# Patient Record
Sex: Male | Born: 1960 | Race: White | Hispanic: No | Marital: Single | Smoking: Never smoker
Health system: Southern US, Community
[De-identification: ages and names within clinical notes are randomized; demographics above are authoritative.]

## PROBLEM LIST (undated history)

## (undated) DIAGNOSIS — Z8669 Personal history of other diseases of the nervous system and sense organs: Secondary | ICD-10-CM

## (undated) DIAGNOSIS — Z9189 Other specified personal risk factors, not elsewhere classified: Secondary | ICD-10-CM

## (undated) DIAGNOSIS — I10 Essential (primary) hypertension: Secondary | ICD-10-CM

## (undated) DIAGNOSIS — N182 Chronic kidney disease, stage 2 (mild): Secondary | ICD-10-CM

## (undated) DIAGNOSIS — G629 Polyneuropathy, unspecified: Secondary | ICD-10-CM

## (undated) DIAGNOSIS — F329 Major depressive disorder, single episode, unspecified: Secondary | ICD-10-CM

## (undated) DIAGNOSIS — R519 Headache, unspecified: Secondary | ICD-10-CM

## (undated) DIAGNOSIS — D649 Anemia, unspecified: Secondary | ICD-10-CM

## (undated) DIAGNOSIS — E1140 Type 2 diabetes mellitus with diabetic neuropathy, unspecified: Secondary | ICD-10-CM

## (undated) DIAGNOSIS — M1990 Unspecified osteoarthritis, unspecified site: Secondary | ICD-10-CM

## (undated) DIAGNOSIS — H548 Legal blindness, as defined in USA: Secondary | ICD-10-CM

## (undated) DIAGNOSIS — Z87442 Personal history of urinary calculi: Secondary | ICD-10-CM

## (undated) DIAGNOSIS — F32A Depression, unspecified: Secondary | ICD-10-CM

## (undated) DIAGNOSIS — E785 Hyperlipidemia, unspecified: Secondary | ICD-10-CM

## (undated) DIAGNOSIS — G2581 Restless legs syndrome: Secondary | ICD-10-CM

## (undated) DIAGNOSIS — E119 Type 2 diabetes mellitus without complications: Secondary | ICD-10-CM

## (undated) DIAGNOSIS — E875 Hyperkalemia: Secondary | ICD-10-CM

## (undated) DIAGNOSIS — B351 Tinea unguium: Secondary | ICD-10-CM

## (undated) DIAGNOSIS — I739 Peripheral vascular disease, unspecified: Secondary | ICD-10-CM

## (undated) DIAGNOSIS — I639 Cerebral infarction, unspecified: Secondary | ICD-10-CM

## (undated) DIAGNOSIS — J80 Acute respiratory distress syndrome: Secondary | ICD-10-CM

## (undated) DIAGNOSIS — K219 Gastro-esophageal reflux disease without esophagitis: Secondary | ICD-10-CM

## (undated) DIAGNOSIS — M75101 Unspecified rotator cuff tear or rupture of right shoulder, not specified as traumatic: Secondary | ICD-10-CM

## (undated) DIAGNOSIS — E559 Vitamin D deficiency, unspecified: Secondary | ICD-10-CM

## (undated) DIAGNOSIS — Z960 Presence of urogenital implants: Secondary | ICD-10-CM

## (undated) DIAGNOSIS — G4730 Sleep apnea, unspecified: Secondary | ICD-10-CM

## (undated) DIAGNOSIS — R51 Headache: Secondary | ICD-10-CM

## (undated) HISTORY — DX: Acute respiratory distress syndrome: J80

## (undated) HISTORY — DX: Type 2 diabetes mellitus with diabetic neuropathy, unspecified: E11.40

## (undated) HISTORY — DX: Tinea unguium: B35.1

## (undated) HISTORY — PX: TONSILLECTOMY AND ADENOIDECTOMY: SUR1326

## (undated) HISTORY — DX: Hyperlipidemia, unspecified: E78.5

## (undated) HISTORY — PX: CARDIOVASCULAR STRESS TEST: SHX262

---

## 2010-08-05 HISTORY — PX: AMPUTATION: SHX166

## 2010-08-05 HISTORY — PX: OTHER SURGICAL HISTORY: SHX169

## 2011-01-04 HISTORY — PX: OTHER SURGICAL HISTORY: SHX169

## 2011-08-06 HISTORY — PX: RETINAL DETACHMENT SURGERY: SHX105

## 2011-08-06 HISTORY — PX: CATARACT EXTRACTION W/ INTRAOCULAR LENS  IMPLANT, BILATERAL: SHX1307

## 2013-02-02 ENCOUNTER — Ambulatory Visit (INDEPENDENT_AMBULATORY_CARE_PROVIDER_SITE_OTHER): Payer: Medicaid Other

## 2013-02-02 ENCOUNTER — Ambulatory Visit (INDEPENDENT_AMBULATORY_CARE_PROVIDER_SITE_OTHER): Payer: Medicaid Other | Admitting: Orthopedic Surgery

## 2013-02-02 VITALS — BP 140/86 | Ht 71.0 in | Wt 298.0 lb

## 2013-02-02 DIAGNOSIS — M25511 Pain in right shoulder: Secondary | ICD-10-CM

## 2013-02-02 DIAGNOSIS — M7540 Impingement syndrome of unspecified shoulder: Secondary | ICD-10-CM | POA: Insufficient documentation

## 2013-02-02 DIAGNOSIS — M25519 Pain in unspecified shoulder: Secondary | ICD-10-CM

## 2013-02-02 DIAGNOSIS — L03113 Cellulitis of right upper limb: Secondary | ICD-10-CM

## 2013-02-02 DIAGNOSIS — IMO0002 Reserved for concepts with insufficient information to code with codable children: Secondary | ICD-10-CM

## 2013-02-02 DIAGNOSIS — M7541 Impingement syndrome of right shoulder: Secondary | ICD-10-CM

## 2013-02-02 MED ORDER — HYDROCODONE-ACETAMINOPHEN 5-325 MG PO TABS: 1.0000 | ORAL_TABLET | Freq: Four times a day (QID) | ORAL | Status: DC | PRN

## 2013-02-02 MED ORDER — DOXYCYCLINE HYCLATE 100 MG PO CAPS: 100.0000 mg | ORAL_CAPSULE | Freq: Two times a day (BID) | ORAL | Status: DC

## 2013-02-02 MED ORDER — DICLOFENAC POTASSIUM 50 MG PO TABS: 50.0000 mg | ORAL_TABLET | Freq: Two times a day (BID) | ORAL | Status: DC

## 2013-02-02 NOTE — Patient Instructions (Addendum)
You have received a steroid shot. 15% of patients experience increased pain at the injection site with in the next 24 hours. This is best treated with ice and tylenol extra strength 2 tabs every 8 hours. If you are still having pain please call the office.   Please call the hospital to START PHYSICAL THERAPY  Impingement Syndrome, Rotator Cuff, Bursitis with Rehab Impingement syndrome is a condition that involves inflammation of the tendons of the rotator cuff and the subacromial bursa, that causes pain in the shoulder. The rotator cuff consists of four tendons and muscles that control much of the shoulder and upper arm function. The subacromial bursa is a fluid filled sac that helps reduce friction between the rotator cuff and one of the bones of the shoulder (acromion). Impingement syndrome is usually an overuse injury that causes swelling of the bursa (bursitis), swelling of the tendon (tendonitis), and/or a tear of the tendon (strain). Strains are classified into three categories. Grade 1 strains cause pain, but the tendon is not lengthened. Grade 2 strains include a lengthened ligament, due to the ligament being stretched or partially ruptured. With grade 2 strains there is still function, although the function may be decreased. Grade 3 strains include a complete tear of the tendon or muscle, and function is usually impaired. SYMPTOMS   Pain around the shoulder, often at the outer portion of the upper arm.  Pain that gets worse with shoulder function, especially when reaching overhead or lifting.  Sometimes, aching when not using the arm.  Pain that wakes you up at night.  Sometimes, tenderness, swelling, warmth, or redness over the affected area.  Loss of strength.  Limited motion of the shoulder, especially reaching behind the back (to the back pocket or to unhook bra) or across your body.  Crackling sound (crepitation) when moving the arm.  Biceps tendon pain and inflammation (in the  front of the shoulder). Worse when bending the elbow or lifting. CAUSES  Impingement syndrome is often an overuse injury, in which chronic (repetitive) motions cause the tendons or bursa to become inflamed. A strain occurs when a force is paced on the tendon or muscle that is greater than it can withstand. Common mechanisms of injury include: Stress from sudden increase in duration, frequency, or intensity of training.  Direct hit (trauma) to the shoulder.  Aging, erosion of the tendon with normal use.  Bony bump on shoulder (acromial spur). RISK INCREASES WITH:  Contact sports (football, wrestling, boxing).  Throwing sports (baseball, tennis, volleyball).  Weightlifting and bodybuilding.  Heavy labor.  Previous injury to the rotator cuff, including impingement.  Poor shoulder strength and flexibility.  Failure to warm up properly before activity.  Inadequate protective equipment.  Old age.  Bony bump on shoulder (acromial spur). PREVENTION   Warm up and stretch properly before activity.  Allow for adequate recovery between workouts.  Maintain physical fitness:  Strength, flexibility, and endurance.  Cardiovascular fitness.  Learn and use proper exercise technique. PROGNOSIS  If treated properly, impingement syndrome usually goes away within 6 weeks. Sometimes surgery is required.  RELATED COMPLICATIONS   Longer healing time if not properly treated, or if not given enough time to heal.  Recurring symptoms, that result in a chronic condition.  Shoulder stiffness, frozen shoulder, or loss of motion.  Rotator cuff tendon tear.  Recurring symptoms, especially if activity is resumed too soon, with overuse, with a direct blow, or when using poor technique. TREATMENT  Treatment first involves the use  of ice and medicine, to reduce pain and inflammation. The use of strengthening and stretching exercises may help reduce pain with activity. These exercises may be  performed at home or with a therapist. If non-surgical treatment is unsuccessful after more than 6 months, surgery may be advised. After surgery and rehabilitation, activity is usually possible in 3 months.  MEDICATION  If pain medicine is needed, nonsteroidal anti-inflammatory medicines (aspirin and ibuprofen), or other minor pain relievers (acetaminophen), are often advised.  Do not take pain medicine for 7 days before surgery.  Prescription pain relievers may be given, if your caregiver thinks they are needed. Use only as directed and only as much as you need.  Corticosteroid injections may be given by your caregiver. These injections should be reserved for the most serious cases, because they may only be given a certain number of times. HEAT AND COLD  Cold treatment (icing) should be applied for 10 to 15 minutes every 2 to 3 hours for inflammation and pain, and immediately after activity that aggravates your symptoms. Use ice packs or an ice massage.  Heat treatment may be used before performing stretching and strengthening activities prescribed by your caregiver, physical therapist, or athletic trainer. Use a heat pack or a warm water soak. SEEK MEDICAL CARE IF:   Symptoms get worse or do not improve in 4 to 6 weeks, despite treatment.  New, unexplained symptoms develop. (Drugs used in treatment may produce side effects.)

## 2013-02-02 NOTE — Progress Notes (Signed)
Patient ID: Oscar Rose, male   DOB: Jul 26, 1961, 52 y.o.   MRN: [id number] Chief Complaint  Patient presents with  . Shoulder Pain    Right shoulder pain, no injury. Referral from Dr. Edrick Oh.        Vital signs are stable as recorded/BP 140/86  Ht 5\' 11"  (1.803 m)  Wt 298 lb (135.172 kg)  BMI 41.58 kg/m2  He has multiple skin lesions over his back and a large one over his upper forearm which appears to be purulent in nature red erythematous, perhaps staph  General appearance is normal, body habitus large  The patient is alert and oriented x 3  The patient's mood and affect are normal  Gait assessment: Normal  The cardiovascular exam reveals normal pulses and temperature without edema or  swelling.  The lymphatic system is negative for palpable lymph nodes  The sensory exam is normal.  There are no pathologic reflexes.  Balance is normal.   Exam of the right shoulder  Inspection tenderness in the peri-acromial region Range of motion painful for elevation 120 with, normal external rotation Stability tests are normal Strength grade 5 motor strength  Skin normal, no rash, or laceration. Provocative tests positive impingement sign and 120  X-rays show no acute abnormalities  Encounter Diagnoses  Name Primary?  . Pain in joint, shoulder region, right   . Impingement syndrome of shoulder, right   . Cellulitis of right upper arm Yes    Shoulder Injection Procedure Note   Pre-operative Diagnosis: right  RC Syndrome  Post-operative Diagnosis: same  Indications: pain   Anesthesia: ethyl chloride   Procedure Details   Verbal consent was obtained for the procedure. The shoulder was prepped withalcohol and the skin was anesthetized. A 20 gauge needle was advanced into the subacromial space through posterior approach without difficulty  The space was then injected with 3 ml 1% lidocaine and 1 ml of depomedrol. The injection site was cleansed with isopropyl  alcohol and a dressing was applied.  Complications:  None; patient tolerated the procedure well.  Recommend physical therapy, [place], Norco, doxycycline for his skin lesion

## 2014-03-22 ENCOUNTER — Encounter: Payer: Self-pay | Admitting: *Deleted

## 2014-03-23 ENCOUNTER — Ambulatory Visit (INDEPENDENT_AMBULATORY_CARE_PROVIDER_SITE_OTHER): Payer: Medicaid Other | Admitting: Cardiology

## 2014-03-23 ENCOUNTER — Encounter: Payer: Self-pay | Admitting: Cardiology

## 2014-03-23 VITALS — BP 132/82 | HR 86 | Ht 70.5 in | Wt 312.0 lb

## 2014-03-23 DIAGNOSIS — R0789 Other chest pain: Secondary | ICD-10-CM

## 2014-03-23 DIAGNOSIS — R072 Precordial pain: Secondary | ICD-10-CM

## 2014-03-23 NOTE — Patient Instructions (Signed)
The current medical regimen is effective;  continue present plan and medications.  Your physician has requested that you have a lexiscan myoview. For further information please visit [email or web address]. Please follow instruction sheet, as given.  Follow up in 6 months with Dr. Percival Spanish in [place].  You will receive a letter in the mail 2 months before you are due.  Please call us when you receive this letter to schedule your follow up appointment.

## 2014-03-23 NOTE — Progress Notes (Signed)
HPI The patient presents as a new patient for evaluation of chest discomfort. He has significant cardiovascular risk factors. He has poorly controlled diabetes.  He doesn't report any prior cardiac history however. He doesn't report any stress testing. For about 6 months he's been getting some chest discomfort. This is a sporadic discomfort. His mid chest. It is mild. He described as a burning and thinks it's heartburn. It comes on spontaneously and goes away spontaneously sometimes lasting 30 minutes he doesn't describe radiation. He does not describe associated symptoms. He does get shortness of breath with activities such as walking up an incline. He does not describe however PND or orthopnea. He's not describing any palpitations, presyncope or syncope.  No Known Allergies  Current Outpatient Prescriptions  Medication Sig Dispense Refill  . amLODipine (NORVASC) 5 MG tablet Take 5 mg by mouth daily.      . fenofibrate micronized (LOFIBRA) 134 MG capsule Take 134 mg by mouth daily before breakfast.      . glipiZIDE (GLUCOTROL XL) 10 MG 24 hr tablet Take 10 mg by mouth daily with breakfast.      . lisinopril (PRINIVIL,ZESTRIL) 10 MG tablet Take 10 mg by mouth daily.      Marland Kitchen omeprazole (PRILOSEC) 40 MG capsule Take 40 mg by mouth daily.      . rosuvastatin (CRESTOR) 20 MG tablet Take 20 mg by mouth daily.       No current facility-administered medications for this visit.    Past Medical History  Diagnosis Date  . HTN (hypertension)   . Diabetes   . Hyperlipidemia   . Cataract   . Diabetic proliferative retinopathy   . CKD (chronic kidney disease)   . Diabetic neuropathy   . Restless leg     Past Surgical History  Procedure Laterality Date  . Amputation      Left big toe partial and right big toe complete  . Cataract extraction    . Tonsillectomy and adenoidectomy      Family History  Problem Relation Age of Onset  . Cancer Mother     Throat    History   Social History    . Marital Status: Single    Spouse Name: N/A    Number of Children: 0  . Years of Education: N/A   Occupational History  . Not on file.   Social History Main Topics  . Smoking status: Never Smoker   . Smokeless tobacco: Not on file  . Alcohol Use: Not on file  . Drug Use: Not on file  . Sexual Activity: Not on file   Other Topics Concern  . Not on file   Social History Narrative   Lives with "wife"    ROS:  Positive for cramping, pain, the, headaches, dizziness, tinnitus, nausea, constipation, reflux. Otherwise as stated in the HPI and negative for all other systems.  PHYSICAL EXAM BP 132/82  Pulse 86  Ht 5' 10.5" (1.791 m)  Wt 312 lb (141.522 kg)  BMI 44.12 kg/m2 GENERAL:  Well appearing HEENT:  Pupils equal round and reactive, fundi not visualized, oral mucosa unremarkable NECK:  No jugular venous distention, waveform within normal limits, carotid upstroke brisk and symmetric, no bruits, no thyromegaly LYMPHATICS:  No cervical, inguinal adenopathy LUNGS:  Clear to auscultation bilaterally BACK:  No CVA tenderness CHEST:  Unremarkable HEART:  PMI not displaced or sustained,S1 and S2 within normal limits, no S3, no S4, no clicks, no rubs, no murmurs ABD:  Flat,  positive bowel sounds normal in frequency in pitch, no bruits, no rebound, no guarding, no midline pulsatile mass, no hepatomegaly, no splenomegaly,obese EXT:  2 plus pulses throughout, mildedema, no cyanosis no clubbing SKIN:  No rashes no nodules NEURO:  Cranial nerves II through XII grossly intact, motor grossly intact throughout PSYCH:  Cognitively intact, oriented to person place and time   EKG:  Sinus rhythm, right axis deviation, rate 81, intervals within normal limits, no acute ST-T wave changes.  03/23/2014   ASSESSMENT AND PLAN  CHEST PAIN:  The pain is somewhat atypical but he has significant cardiovascular risk factors. He needs stress testing would not be a walk on a treadmill. Therefore, he will  have a [employer].  SNORING:  His wife describes apnea but he has refused sleep testing. It was definitely has this diagnosis but I will defer to [place] see if he can talk him into it at some point. He certainly needs weight loss.  HTN:  His blood pressure is well controlled. He'll continue the meds as listed.

## 2014-03-28 ENCOUNTER — Other Ambulatory Visit (HOSPITAL_COMMUNITY): Payer: Self-pay | Admitting: Cardiology

## 2014-03-28 DIAGNOSIS — R0789 Other chest pain: Secondary | ICD-10-CM

## 2014-03-31 ENCOUNTER — Telehealth (HOSPITAL_COMMUNITY): Payer: Self-pay

## 2014-03-31 NOTE — Telephone Encounter (Signed)
Encounter complete. 

## 2014-04-05 ENCOUNTER — Ambulatory Visit (HOSPITAL_COMMUNITY)
Admission: RE | Admit: 2014-04-05 | Discharge: 2014-04-05 | Disposition: A | Discharge status: Home / Self Care | Payer: Medicaid Other | Source: Ambulatory Visit | Attending: Cardiology | Admitting: Cardiology

## 2014-04-05 DIAGNOSIS — R5381 Other malaise: Secondary | ICD-10-CM | POA: Insufficient documentation

## 2014-04-05 DIAGNOSIS — R002 Palpitations: Secondary | ICD-10-CM | POA: Insufficient documentation

## 2014-04-05 DIAGNOSIS — R0609 Other forms of dyspnea: Secondary | ICD-10-CM | POA: Diagnosis not present

## 2014-04-05 DIAGNOSIS — R0789 Other chest pain: Secondary | ICD-10-CM | POA: Insufficient documentation

## 2014-04-05 DIAGNOSIS — R9439 Abnormal result of other cardiovascular function study: Secondary | ICD-10-CM | POA: Insufficient documentation

## 2014-04-05 DIAGNOSIS — R0989 Other specified symptoms and signs involving the circulatory and respiratory systems: Secondary | ICD-10-CM | POA: Diagnosis not present

## 2014-04-05 DIAGNOSIS — R5383 Other fatigue: Secondary | ICD-10-CM | POA: Diagnosis not present

## 2014-04-05 DIAGNOSIS — R42 Dizziness and giddiness: Secondary | ICD-10-CM | POA: Insufficient documentation

## 2014-04-05 MED ORDER — AMINOPHYLLINE 25 MG/ML IV SOLN
150.0000 mg | Freq: Once | INTRAVENOUS | Status: AC
  Administered 2014-04-05: 150 mg via INTRAVENOUS

## 2014-04-05 MED ORDER — TECHNETIUM TC 99M SESTAMIBI GENERIC - CARDIOLITE
30.0000 | Freq: Once | INTRAVENOUS | Status: AC | PRN
  Administered 2014-04-05: 30 via INTRAVENOUS

## 2014-04-05 MED ORDER — REGADENOSON 0.4 MG/5ML IV SOLN
0.4000 mg | Freq: Once | INTRAVENOUS | Status: AC
  Administered 2014-04-05: 0.4 mg via INTRAVENOUS

## 2014-04-05 NOTE — Procedures (Addendum)
[facility] [address] [place] [address] [id number]  Cardiology Nuclear Med Study  Oscar Rose is a 53 y.o. male     MRN : [id number]     DOB: [birth year]  Procedure Date: 04/05/2014  Nuclear Med Background Indication for Stress Test:  Evaluation for Ischemia History:  No prior cardiac or respiratory history reported;No prior NUC MPI for comparison. Cardiac Risk Factors: Family History - CAD, Hypertension, Lipids, NIDDM and Obesity  Symptoms:  Chest Pain, Dizziness, DOE, Fatigue, Light-Headedness and Palpitations   Nuclear Pre-Procedure Caffeine/Decaff Intake:  9:00pm NPO After: 7:00am   IV Site: R Hand  IV 0.9% NS with Angio Cath:  22g  Chest Size (in):  56"  IV Started by: Rolene Course, RN  Height: 5' 10.5" (1.791 m)  Cup Size: n/a  BMI:  Body mass index is 44.12 kg/(m^2). Weight:  312 lb (141.522 kg)   Tech Comments:  n/a    Nuclear Med Study 1 or 2 day study: 2 day  Stress Test Type:  [place] Provider:  Minus Breeding, MD   Resting Radionuclide: Technetium 73m Sestamibi  Resting Radionuclide Dose: 28.0 mCi   Stress Radionuclide:  Technetium 28m Sestamibi  Stress Radionuclide Dose: 30.0 mCi           Stress Protocol Rest HR: 81 Stress HR: 92  Rest BP: 164/68 Stress BP: 146/48  Exercise Time (min): n/a METS: n/a   Predicted Max HR: 169 bpm % Max HR: 54.44 bpm Rate Pressure Product: 13432  Dose of Adenosine (mg):  n/a Dose of Lexiscan: 0.4 mg  Dose of Atropine (mg): n/a Dose of Dobutamine: n/a mcg/kg/min (at max HR)  Stress Test Technologist: Leane Para, CCT Nuclear Technologist: Imagene Riches, CNMT   Rest Procedure:  Myocardial perfusion imaging was performed at rest 45 minutes following the intravenous administration of Technetium 69m Sestamibi. Stress Procedure:  The patient received IV Lexiscan 0.4 mg over 15-seconds.  Technetium 49m Sestamibi injected at 30-seconds.   Patient experienced epigastric pressure and nausea and 150 mg of Aminophylline IV was administered. There were no significant changes with Lexiscan.  Quantitative spect images were obtained after a 45 minute delay.  Transient Ischemic Dilatation (Normal <1.22):  1.14  QGS EDV:  122 ml QGS ESV:  52 ml LV Ejection Fraction: 58%     Rest ECG: NSR with non-specific ST-T wave changes  Stress ECG: No significant change from baseline ECG  QPS Raw Data Images:  Mild diaphragmatic attenuation.  Normal left ventricular size. Stress Images:  Normal homogeneous uptake in all areas of the myocardium. Rest Images:  Normal homogeneous uptake in all areas of the myocardium. Subtraction (SDS):  No evidence of ischemia. LV Wall Motion:  NL LV Function; NL Wall Motion or EF 58%  Impression Exercise Capacity:  Lexiscan with no exercise. BP Response:  Normal blood pressure response. Clinical Symptoms:  No significant symptoms noted. ECG Impression:  No significant ECG changes with Lexiscan. Comparison with Prior Nuclear Study: No previous nuclear study performed   Overall Impression:  Low risk stress nuclear study with mild diaphragmatic attenuation artifact.   Sanda Klein, MD  04/06/2014 12:16 PM

## 2014-04-06 ENCOUNTER — Ambulatory Visit (HOSPITAL_COMMUNITY)
Admission: RE | Admit: 2014-04-06 | Discharge: 2014-04-06 | Disposition: A | Discharge status: Home / Self Care | Payer: Medicaid Other | Source: Ambulatory Visit | Attending: Cardiovascular Disease | Admitting: Cardiovascular Disease

## 2014-04-06 DIAGNOSIS — R0989 Other specified symptoms and signs involving the circulatory and respiratory systems: Secondary | ICD-10-CM | POA: Diagnosis not present

## 2014-04-06 DIAGNOSIS — R42 Dizziness and giddiness: Secondary | ICD-10-CM | POA: Insufficient documentation

## 2014-04-06 DIAGNOSIS — R079 Chest pain, unspecified: Secondary | ICD-10-CM | POA: Diagnosis not present

## 2014-04-06 DIAGNOSIS — R0789 Other chest pain: Secondary | ICD-10-CM

## 2014-04-06 DIAGNOSIS — Z8249 Family history of ischemic heart disease and other diseases of the circulatory system: Secondary | ICD-10-CM | POA: Insufficient documentation

## 2014-04-06 DIAGNOSIS — R5381 Other malaise: Secondary | ICD-10-CM | POA: Insufficient documentation

## 2014-04-06 DIAGNOSIS — R5383 Other fatigue: Secondary | ICD-10-CM

## 2014-04-06 DIAGNOSIS — R9439 Abnormal result of other cardiovascular function study: Secondary | ICD-10-CM | POA: Diagnosis not present

## 2014-04-06 DIAGNOSIS — R002 Palpitations: Secondary | ICD-10-CM | POA: Diagnosis not present

## 2014-04-06 DIAGNOSIS — R0609 Other forms of dyspnea: Secondary | ICD-10-CM | POA: Diagnosis not present

## 2014-04-06 MED ORDER — TECHNETIUM TC 99M SESTAMIBI GENERIC - CARDIOLITE
28.0000 | Freq: Once | INTRAVENOUS | Status: AC | PRN
  Administered 2014-04-06: 28 via INTRAVENOUS

## 2014-06-20 ENCOUNTER — Other Ambulatory Visit: Payer: Self-pay | Admitting: Urology

## 2014-06-27 ENCOUNTER — Encounter (HOSPITAL_COMMUNITY): Payer: Self-pay | Admitting: *Deleted

## 2014-06-28 ENCOUNTER — Encounter (HOSPITAL_BASED_OUTPATIENT_CLINIC_OR_DEPARTMENT_OTHER): Payer: Self-pay | Admitting: *Deleted

## 2014-06-29 ENCOUNTER — Encounter (HOSPITAL_BASED_OUTPATIENT_CLINIC_OR_DEPARTMENT_OTHER): Payer: Self-pay | Admitting: *Deleted

## 2014-06-29 NOTE — Progress Notes (Signed)
   06/29/14 1510  OBSTRUCTIVE SLEEP APNEA  Have you ever been diagnosed with sleep apnea through a sleep study? No  Do you snore loudly (loud enough to be heard through closed doors)?  1  Do you often feel tired, fatigued, or sleepy during the daytime? 0  Has anyone observed you stop breathing during your sleep? 1  Do you have, or are you being treated for high blood pressure? 1  BMI more than 35 kg/m2? 1  Age over 51 years old? 1  Neck circumference greater than 40 cm/16 inches? 1  Gender: 1  Obstructive Sleep Apnea Score 7  Score 4 or greater  Results sent to PCP

## 2014-06-29 NOTE — Progress Notes (Signed)
NPO AFTER MN. ARRIVE AT 1100. NEEDS ISTAT. CURRENT EKG IN CHART AND EPIC. WILL TAKE TRICOR AND PRILOSEC AM DOS W/ SIPS OF WATER.

## 2014-07-04 ENCOUNTER — Ambulatory Visit (HOSPITAL_COMMUNITY): Payer: Medicaid Other

## 2014-07-04 ENCOUNTER — Encounter (HOSPITAL_COMMUNITY)
Admission: RE | Disposition: A | Discharge status: Home / Self Care | Payer: Self-pay | Source: Ambulatory Visit | Attending: Urology

## 2014-07-04 ENCOUNTER — Ambulatory Visit (HOSPITAL_COMMUNITY)
Admission: RE | Admit: 2014-07-04 | Discharge: 2014-07-04 | Disposition: A | Discharge status: Home / Self Care | Payer: Medicaid Other | Source: Ambulatory Visit | Attending: Urology | Admitting: Urology

## 2014-07-04 ENCOUNTER — Encounter (HOSPITAL_COMMUNITY): Payer: Self-pay | Admitting: *Deleted

## 2014-07-04 DIAGNOSIS — Z833 Family history of diabetes mellitus: Secondary | ICD-10-CM | POA: Insufficient documentation

## 2014-07-04 DIAGNOSIS — N183 Chronic kidney disease, stage 3 (moderate): Secondary | ICD-10-CM | POA: Insufficient documentation

## 2014-07-04 DIAGNOSIS — I129 Hypertensive chronic kidney disease with stage 1 through stage 4 chronic kidney disease, or unspecified chronic kidney disease: Secondary | ICD-10-CM | POA: Insufficient documentation

## 2014-07-04 DIAGNOSIS — Z960 Presence of urogenital implants: Secondary | ICD-10-CM | POA: Insufficient documentation

## 2014-07-04 DIAGNOSIS — Z794 Long term (current) use of insulin: Secondary | ICD-10-CM | POA: Insufficient documentation

## 2014-07-04 DIAGNOSIS — E119 Type 2 diabetes mellitus without complications: Secondary | ICD-10-CM | POA: Insufficient documentation

## 2014-07-04 DIAGNOSIS — G4733 Obstructive sleep apnea (adult) (pediatric): Secondary | ICD-10-CM | POA: Diagnosis not present

## 2014-07-04 DIAGNOSIS — N210 Calculus in bladder: Secondary | ICD-10-CM | POA: Insufficient documentation

## 2014-07-04 DIAGNOSIS — Z6841 Body Mass Index (BMI) 40.0 and over, adult: Secondary | ICD-10-CM | POA: Insufficient documentation

## 2014-07-04 DIAGNOSIS — Z8639 Personal history of other endocrine, nutritional and metabolic disease: Secondary | ICD-10-CM | POA: Insufficient documentation

## 2014-07-04 DIAGNOSIS — Z8719 Personal history of other diseases of the digestive system: Secondary | ICD-10-CM | POA: Diagnosis not present

## 2014-07-04 DIAGNOSIS — Z8679 Personal history of other diseases of the circulatory system: Secondary | ICD-10-CM | POA: Diagnosis not present

## 2014-07-04 DIAGNOSIS — Z87448 Personal history of other diseases of urinary system: Secondary | ICD-10-CM | POA: Diagnosis not present

## 2014-07-04 DIAGNOSIS — Z801 Family history of malignant neoplasm of trachea, bronchus and lung: Secondary | ICD-10-CM | POA: Diagnosis not present

## 2014-07-04 DIAGNOSIS — N200 Calculus of kidney: Secondary | ICD-10-CM | POA: Diagnosis not present

## 2014-07-04 DIAGNOSIS — N201 Calculus of ureter: Secondary | ICD-10-CM

## 2014-07-04 DIAGNOSIS — Z8669 Personal history of other diseases of the nervous system and sense organs: Secondary | ICD-10-CM | POA: Diagnosis not present

## 2014-07-04 HISTORY — DX: Depression, unspecified: F32.A

## 2014-07-04 HISTORY — DX: Major depressive disorder, single episode, unspecified: F32.9

## 2014-07-04 HISTORY — DX: Unspecified osteoarthritis, unspecified site: M19.90

## 2014-07-04 HISTORY — DX: Gastro-esophageal reflux disease without esophagitis: K21.9

## 2014-07-04 LAB — GLUCOSE, CAPILLARY: GLUCOSE-CAPILLARY: 174 mg/dL — AB (ref 70–99)

## 2014-07-04 SURGERY — LITHOTRIPSY, ESWL
Anesthesia: LOCAL | Laterality: Left

## 2014-07-04 MED ORDER — DEXTROSE-NACL 5-0.45 % IV SOLN
INTRAVENOUS | Status: DC
  Administered 2014-07-04: 15:00:00 via INTRAVENOUS

## 2014-07-04 MED ORDER — OXYCODONE-ACETAMINOPHEN 5-325 MG PO TABS: 1.0000 | ORAL_TABLET | Freq: Four times a day (QID) | ORAL | Status: DC | PRN

## 2014-07-04 MED ORDER — CIPROFLOXACIN HCL 500 MG PO TABS
500.0000 mg | ORAL_TABLET | ORAL | Status: AC
  Administered 2014-07-04: 500 mg via ORAL
  Filled 2014-07-04: qty 1

## 2014-07-04 MED ORDER — DIPHENHYDRAMINE HCL 25 MG PO CAPS
25.0000 mg | ORAL_CAPSULE | ORAL | Status: AC
  Administered 2014-07-04: 25 mg via ORAL
  Filled 2014-07-04: qty 1

## 2014-07-04 MED ORDER — DIAZEPAM 5 MG PO TABS
10.0000 mg | ORAL_TABLET | ORAL | Status: AC
  Administered 2014-07-04: 10 mg via ORAL
  Filled 2014-07-04: qty 2

## 2014-07-04 NOTE — H&P (Signed)
Active Problems Problems  1. Nephrolithiasis (N20.0) 2. Ureteral stent retained (Z96.0)  History of Present Illness Oscar Rose is a 53 yo WM sent in consultation from [facility] for stone disease. He had a renal US for renal insufficiency and he was found to have a 2.8cm bladder stone and mild left hydro with renal stones.  He has a history of stones that was treated endoscopically at [facility] on 01/17/11 by Dr. Glendell Docker.  He has some left flank pain but he has chronic nausea so it is difficult for his to know if it is from the pain. He has had no hematuria.  He has no voiding complaints other than frequency from a diuretic.   Past Medical History Problems  1. History of diabetes mellitus (Z86.39) 2. History of esophageal reflux (Z87.19) 3. History of glaucoma (Z86.69) 4. History of hypercholesterolemia (Z86.39) 5. History of hypertension (Z86.79) 6. History of renal insufficiency syndrome (Z87.448) 7. History of Obstructive sleep apnea, adult (G47.33)  Surgical History Problems  1. History of Back Surgery 2. History of Cystoscopy With Ureteroscopy Left 3. History of Foot Surgery 4. History of Kidney Surgery  Current Meds 1. Crestor 20 MG Oral Tablet;  Therapy: (Recorded:16Nov2015) to Recorded 2. Fenofibrate TABS;  Therapy: (Recorded:16Nov2015) to Recorded 3. Furosemide 20 MG Oral Tablet;  Therapy: (Recorded:16Nov2015) to Recorded 4. GlipiZIDE 10 MG Oral Tablet;  Therapy: (Recorded:16Nov2015) to Recorded 5. Lantus SOLN;  Therapy: (Recorded:16Nov2015) to Recorded 6. Lisinopril 20 MG Oral Tablet;  Therapy: (Recorded:16Nov2015) to Recorded 7. Omeprazole 40 MG Oral Capsule Delayed Release;  Therapy: (Recorded:16Nov2015) to Recorded  Allergies Medication  1. No Known Drug Allergies  Family History Problems  1. Family history of diabetes mellitus (Z83.3) : Maternal Aunt 2. Family history of Throat cancer : Mother  Social History Problems    Denied: History of Alcohol  use   Caffeine use (F15.90)   very little   Never a smoker   Number of children   none   Occupation   none   Single  Review of Systems Genitourinary, constitutional, skin, eye, otolaryngeal, hematologic/lymphatic, cardiovascular, pulmonary, endocrine, musculoskeletal, gastrointestinal, neurological and psychiatric system(s) were reviewed and pertinent findings if present are noted and are otherwise negative.  Genitourinary: nocturia and erectile dysfunction.  Gastrointestinal: nausea and heartburn.  Cardiovascular: leg swelling.  Neurological: dizziness.    Vitals Vital Signs [Data Includes: Last 1 Day]  Recorded: [id number] 09:36AM  Height: 5 ft 10 in Weight: 319 lb  BMI Calculated: 45.77 BSA Calculated: 2.55 Blood Pressure: 117 / 74 Temperature: 98.4 F Heart Rate: 102  Physical Exam Constitutional: Well nourished and well developed . No acute distress.  ENT:. The ears and nose are normal in appearance.  Neck: The appearance of the neck is normal and no neck mass is present.  Pulmonary: No respiratory distress and normal respiratory rhythm and effort.  Cardiovascular: Heart rate and rhythm are normal . No peripheral edema.  Abdomen: The abdomen is obese. The abdomen is soft and nontender. No masses are palpated. No CVA tenderness. No hernias are palpable. No hepatosplenomegaly noted.  Lymphatics: The posterior cervical and supraclavicular nodes are not enlarged or tender.  Skin: Normal skin turgor, no visible rash and no visible skin lesions.  Neuro/Psych:. Mood and affect are appropriate.    Results/Data  Urine [Data Includes: Last 1 Day]   [id number]  COLOR YELLOW   APPEARANCE CLOUDY   SPECIFIC GRAVITY 1.025   pH 5.5   GLUCOSE 100 mg/dL  BILIRUBIN NEG   KETONE  NEG mg/dL  BLOOD LARGE   PROTEIN TRACE mg/dL  UROBILINOGEN 0.2 mg/dL  NITRITE NEG   LEUKOCYTE ESTERASE TRACE   SQUAMOUS EPITHELIAL/HPF RARE   WBC 3-6 WBC/hpf  RBC 21-50 RBC/hpf  BACTERIA FEW    CRYSTALS NONE SEEN   CASTS NONE SEEN    Old records or history reviewed: I have reviewed records from [facility].  The following images/tracing/specimen were independently visualized:  renal US films reviewed and he has a 1cm LLP stone with mild hydro and a 2.8cm probable bladder stone. KUB today shows a retained left ureteral stent with marked encrustation of the stent in the area of the UPJ and proximal coil about 1+cm in diameter. There is encrustation of the distal coil but the stent is intact. There are no gas, soft tissue or significant bony abnormalities. CT today confirms the encrusted stent but the ureteral portion is clean. He has an addition 1cm stone in a posterior calyx in addition to the encrusted intrarenal and bladder portions of the stent. A full report is pending.  The following clinical lab reports were reviewed:  UA reviewed.  The following radiology reports were reviewed: I have reviewed his outside renal US report.  Will request old records/history: I have obtained the imaging records from [facility].  20 Jun 2014 9:07 AM  UA With REFLEX    COLOR YELLOW     APPEARANCE CLOUDY     SPECIFIC GRAVITY 1.025     pH 5.5     GLUCOSE 100     BILIRUBIN NEG     KETONE NEG     BLOOD LARGE     PROTEIN TRACE     UROBILINOGEN 0.2     NITRITE NEG     LEUKOCYTE ESTERASE TRACE     SQUAMOUS EPITHELIAL/HPF RARE     WBC 3-6     CRYSTALS NONE SEEN     CASTS NONE SEEN     RBC 21-50     BACTERIA FEW     Assessment Assessed  1. History of renal insufficiency syndrome (Z87.448) 2. Nephrolithiasis (N20.0) 3. Ureteral stent retained (Z96.0)  He has a retained stent with proximal and distal encrustation.   Plan  Nephrolithiasis  1. URINE CULTURE; Status:Hold For - Specimen/Data Collection,Appointment; Requested  for:16Nov2015;  2. Get Outside Records Office  Follow-up  Status: Hold For - Records,Records Release   Requested for: [id number] Nephrolithiasis, Ureteral stent retained   3. AU CT-STONE PROTOCOL; Status:In Progress - Specimen/Data Collected;   Done:  [id number] 12:00AM Ureteral stent retained  4. Follow-up Schedule Surgery Office  Follow-up  Status: Hold For - Appointment   Requested for: [phone]  I am going to set him up for ESWL of the proximal encrustation and then will take him to the OR for cystoscopy with stent removal and ureteroscopy with stone removal as needed.  I have reviewed the risks of bleeding, infection, ureteral and renal injury, need for additional procedures including a perc, thrombotic events and anesthetic complications.  I will request records from [facility] and Dr. Carl/Dr. Exie Parody regarding his prior procedure.  KUB; Status:Resulted - Requires Verification;  Done: [id number] 12:00AM Due:18Nov2015; Marked Important;Ordered; Today;  [id number]; Ordered [id number], Kymari Nuon;   Discussion/Summary  CC: Dr. Corliss Parish and Dr. Dione Housekeeper. 1

## 2014-07-04 NOTE — Interval H&P Note (Signed)
History and Physical Interval Note:  No changes.   He will have ureteroscopy on Thursday.  07/04/2014 4:22 PM  Alexander Mt  has presented today for surgery, with the diagnosis of RETAINED LEFT STENT WITH ENCRUSTING STONES  The various methods of treatment have been discussed with the patient and family. After consideration of risks, benefits and other options for treatment, the patient has consented to  Procedure(s): LEFT EXTRACORPOREAL SHOCK WAVE LITHOTRIPSY (ESWL) (Left) as a surgical intervention .  The patient's history has been reviewed, patient examined, no change in status, stable for surgery.  I have reviewed the patient's chart and labs.  Questions were answered to the patient's satisfaction.     Danyl Deems J

## 2014-07-04 NOTE — Discharge Instructions (Signed)
Lithotripsy for Kidney Stones °Lithotripsy is a treatment that can sometimes help eliminate kidney stones and pain that they cause. A form of lithotripsy, also known as extracorporeal shock wave lithotripsy, is a nonsurgical procedure that helps your body rid itself of the kidney stone when it is too big to pass on its own. Extracorporeal shock wave lithotripsy is a method of crushing a kidney stone with shock waves. These shock waves pass through your body and are focused on your stone. They cause the kidney stones to crumble while still in the urinary tract. It is then easier for the smaller pieces of stone to pass in the urine. °Lithotripsy usually takes about an hour. It is done in a hospital, a lithotripsy center, or a mobile unit. It usually does not require an overnight stay. Your health care provider will instruct you on preparation for the procedure. Your health care provider will tell you what to expect afterward. °LET YOUR HEALTH CARE PROVIDER KNOW ABOUT: °· Any allergies you have. °· All medicines you are taking, including vitamins, herbs, eye drops, creams, and over-the-counter medicines. °· Previous problems you or members of your family have had with the use of anesthetics. °· Any blood disorders you have. °· Previous surgeries you have had. °· Medical conditions you have. °RISKS AND COMPLICATIONS °Generally, lithotripsy for kidney stones is a safe procedure. However, as with any procedure, complications can occur. Possible complications include: °· Infection. °· Bleeding of the kidney. °· Bruising of the kidney or skin. °· Obstruction of the ureter. °· Failure of the stone to fragment. °BEFORE THE PROCEDURE °· Do not eat or drink for 6-8 hours prior to the procedure. You may, however, take the medications with a sip of water that your physician instructs you to take °· Do not take aspirin or aspirin-containing products for 7 days prior to your procedure °· Do not take nonsteroidal anti-inflammatory  products for 7 days prior to your procedure °PROCEDURE °A stent (flexible tube with holes) may be placed in your ureter. The ureter is the tube that transports the urine from the kidneys to the bladder. Your health care provider may place a stent before the procedure. This will help keep urine flowing from the kidney if the fragments of the stone block the ureter. You may have an IV tube placed in one of your veins to give you fluids and medicines. These medicines may help you relax or make you sleep. During the procedure, you will lie comfortably on a fluid-filled cushion or in a warm-water bath. After an X-ray or ultrasound exam to locate your stone, shock waves are aimed at the stone. If you are awake, you may feel a tapping sensation as the shock waves pass through your body. If large stone particles remain after treatment, a second procedure may be necessary at a later date. °For comfort during the test: °· Relax as much as possible. °· Try to remain still as much as possible. °· Try to follow instructions to speed up the test. °· Let your health care provider know if you are uncomfortable, anxious, or in pain. °AFTER THE PROCEDURE  °After surgery, you will be taken to the recovery area. A nurse will watch and check your progress. Once you're awake, stable, and taking fluids well, you will be allowed to go home as long as there are no problems. You will also be allowed to pass your urine before discharge. You may be given antibiotics to help prevent infection. You may also be prescribed   pain medicine if needed. In a week or two, your health care provider may remove your stent, if you have one. You may first have an X-ray exam to check on how successful the fragmentation of your stone has been and how much of the stone has passed. Your health care provider will check to see whether or not stone particles remain. °SEEK IMMEDIATE MEDICAL CARE IF: °· You develop a fever or shaking chills. °· Your pain is not  relieved by medicine. °· You feel sick to your stomach (nauseated) and you vomit. °· You develop heavy bleeding. °· You have difficulty urinating. °· You start to pass your stent from your penis. °Document Released: 07/19/2000 Document Revised: 05/12/2013 Document Reviewed: 02/04/2013 °[employer]® Patient Information ©2015 [employer]. This information is not intended to replace advice given to you by your health care provider. Make sure you discuss any questions you have with your health care provider. ° °

## 2014-07-07 ENCOUNTER — Encounter (HOSPITAL_BASED_OUTPATIENT_CLINIC_OR_DEPARTMENT_OTHER)
Admission: RE | Disposition: A | Discharge status: Home / Self Care | Payer: Self-pay | Source: Ambulatory Visit | Attending: Urology

## 2014-07-07 ENCOUNTER — Ambulatory Visit (HOSPITAL_COMMUNITY)
Admission: RE | Admit: 2014-07-07 | Discharge: 2014-07-07 | Disposition: A | Discharge status: Home / Self Care | Payer: Medicaid Other | Source: Ambulatory Visit | Attending: Urology | Admitting: Urology

## 2014-07-07 ENCOUNTER — Encounter (HOSPITAL_BASED_OUTPATIENT_CLINIC_OR_DEPARTMENT_OTHER): Payer: Self-pay | Admitting: *Deleted

## 2014-07-07 ENCOUNTER — Ambulatory Visit (HOSPITAL_BASED_OUTPATIENT_CLINIC_OR_DEPARTMENT_OTHER)
Admission: RE | Admit: 2014-07-07 | Discharge: 2014-07-07 | Disposition: A | Discharge status: Home / Self Care | Payer: Medicaid Other | Source: Ambulatory Visit | Attending: Urology | Admitting: Urology

## 2014-07-07 ENCOUNTER — Ambulatory Visit (HOSPITAL_BASED_OUTPATIENT_CLINIC_OR_DEPARTMENT_OTHER): Payer: Medicaid Other | Admitting: Anesthesiology

## 2014-07-07 DIAGNOSIS — E780 Pure hypercholesterolemia: Secondary | ICD-10-CM | POA: Insufficient documentation

## 2014-07-07 DIAGNOSIS — Z79899 Other long term (current) drug therapy: Secondary | ICD-10-CM | POA: Diagnosis not present

## 2014-07-07 DIAGNOSIS — G4733 Obstructive sleep apnea (adult) (pediatric): Secondary | ICD-10-CM | POA: Insufficient documentation

## 2014-07-07 DIAGNOSIS — H409 Unspecified glaucoma: Secondary | ICD-10-CM | POA: Insufficient documentation

## 2014-07-07 DIAGNOSIS — K219 Gastro-esophageal reflux disease without esophagitis: Secondary | ICD-10-CM | POA: Insufficient documentation

## 2014-07-07 DIAGNOSIS — Y831 Surgical operation with implant of artificial internal device as the cause of abnormal reaction of the patient, or of later complication, without mention of misadventure at the time of the procedure: Secondary | ICD-10-CM | POA: Insufficient documentation

## 2014-07-07 DIAGNOSIS — I10 Essential (primary) hypertension: Secondary | ICD-10-CM | POA: Insufficient documentation

## 2014-07-07 DIAGNOSIS — Z6835 Body mass index (BMI) 35.0-35.9, adult: Secondary | ICD-10-CM | POA: Diagnosis not present

## 2014-07-07 DIAGNOSIS — N200 Calculus of kidney: Secondary | ICD-10-CM | POA: Diagnosis not present

## 2014-07-07 DIAGNOSIS — E119 Type 2 diabetes mellitus without complications: Secondary | ICD-10-CM | POA: Diagnosis not present

## 2014-07-07 DIAGNOSIS — T8389XA Other specified complication of genitourinary prosthetic devices, implants and grafts, initial encounter: Secondary | ICD-10-CM | POA: Insufficient documentation

## 2014-07-07 DIAGNOSIS — Z960 Presence of urogenital implants: Secondary | ICD-10-CM | POA: Insufficient documentation

## 2014-07-07 DIAGNOSIS — Z794 Long term (current) use of insulin: Secondary | ICD-10-CM | POA: Insufficient documentation

## 2014-07-07 HISTORY — PX: HOLMIUM LASER APPLICATION: SHX5852

## 2014-07-07 HISTORY — DX: Unspecified rotator cuff tear or rupture of right shoulder, not specified as traumatic: M75.101

## 2014-07-07 HISTORY — DX: Type 2 diabetes mellitus without complications: E11.9

## 2014-07-07 HISTORY — DX: Chronic kidney disease, stage 2 (mild): N18.2

## 2014-07-07 HISTORY — DX: Personal history of other diseases of the nervous system and sense organs: Z86.69

## 2014-07-07 HISTORY — DX: Legal blindness, as defined in USA: H54.8

## 2014-07-07 HISTORY — DX: Essential (primary) hypertension: I10

## 2014-07-07 HISTORY — DX: Restless legs syndrome: G25.81

## 2014-07-07 HISTORY — DX: Personal history of urinary calculi: Z87.442

## 2014-07-07 HISTORY — DX: Other specified personal risk factors, not elsewhere classified: Z91.89

## 2014-07-07 HISTORY — DX: Presence of urogenital implants: Z96.0

## 2014-07-07 HISTORY — PX: CYSTOSCOPY W/ URETERAL STENT REMOVAL: SHX1430

## 2014-07-07 HISTORY — PX: CYSTOSCOPY WITH URETEROSCOPY AND STENT PLACEMENT: SHX6377

## 2014-07-07 LAB — POCT I-STAT 4, (NA,K, GLUC, HGB,HCT)
Glucose, Bld: 172 mg/dL — ABNORMAL HIGH (ref 70–99)
HCT: 37 % — ABNORMAL LOW (ref 39.0–52.0)
HEMOGLOBIN: 12.6 g/dL — AB (ref 13.0–17.0)
POTASSIUM: 4.5 meq/L (ref 3.7–5.3)
Sodium: 137 mEq/L (ref 137–147)

## 2014-07-07 LAB — GLUCOSE, CAPILLARY: GLUCOSE-CAPILLARY: 190 mg/dL — AB (ref 70–99)

## 2014-07-07 SURGERY — REMOVAL, STENT, URETER, CYSTOSCOPIC
Anesthesia: General | Site: Ureter

## 2014-07-07 MED ORDER — EPHEDRINE SULFATE 50 MG/ML IJ SOLN
INTRAMUSCULAR | Status: DC | PRN
  Administered 2014-07-07 (×3): 10 mg via INTRAVENOUS

## 2014-07-07 MED ORDER — SODIUM CHLORIDE 0.9 % IJ SOLN
3.0000 mL | Freq: Two times a day (BID) | INTRAMUSCULAR | Status: DC
  Filled 2014-07-07: qty 3

## 2014-07-07 MED ORDER — METOCLOPRAMIDE HCL 5 MG/ML IJ SOLN
INTRAMUSCULAR | Status: DC | PRN
  Administered 2014-07-07: 10 mg via INTRAVENOUS

## 2014-07-07 MED ORDER — PHENYLEPHRINE HCL 10 MG/ML IJ SOLN
INTRAMUSCULAR | Status: DC | PRN
  Administered 2014-07-07 (×5): 40 ug via INTRAVENOUS

## 2014-07-07 MED ORDER — PROPOFOL 10 MG/ML IV BOLUS
INTRAVENOUS | Status: DC | PRN
  Administered 2014-07-07: 300 mg via INTRAVENOUS
  Administered 2014-07-07: 20 mg via INTRAVENOUS
  Administered 2014-07-07 (×2): 30 mg via INTRAVENOUS

## 2014-07-07 MED ORDER — MIDAZOLAM HCL 5 MG/5ML IJ SOLN
INTRAMUSCULAR | Status: DC | PRN
  Administered 2014-07-07: 2 mg via INTRAVENOUS

## 2014-07-07 MED ORDER — MIDAZOLAM HCL 2 MG/2ML IJ SOLN
INTRAMUSCULAR | Status: AC
  Filled 2014-07-07: qty 2

## 2014-07-07 MED ORDER — OXYCODONE-ACETAMINOPHEN 5-325 MG PO TABS: 1.0000 | ORAL_TABLET | Freq: Four times a day (QID) | ORAL | Status: DC | PRN

## 2014-07-07 MED ORDER — LACTATED RINGERS IV SOLN
INTRAVENOUS | Status: DC
  Administered 2014-07-07 (×2): via INTRAVENOUS
  Filled 2014-07-07: qty 1000

## 2014-07-07 MED ORDER — CIPROFLOXACIN HCL 500 MG PO TABS: 500.0000 mg | ORAL_TABLET | Freq: Two times a day (BID) | ORAL | Status: DC

## 2014-07-07 MED ORDER — ONDANSETRON HCL 4 MG/2ML IJ SOLN
INTRAMUSCULAR | Status: DC | PRN
  Administered 2014-07-07 (×2): 4 mg via INTRAVENOUS

## 2014-07-07 MED ORDER — LIDOCAINE HCL (CARDIAC) 20 MG/ML IV SOLN
INTRAVENOUS | Status: DC | PRN
  Administered 2014-07-07: 100 mg via INTRAVENOUS

## 2014-07-07 MED ORDER — FENTANYL CITRATE 0.05 MG/ML IJ SOLN
INTRAMUSCULAR | Status: DC | PRN
  Administered 2014-07-07: 25 ug via INTRAVENOUS
  Administered 2014-07-07: 50 ug via INTRAVENOUS
  Administered 2014-07-07 (×3): 25 ug via INTRAVENOUS

## 2014-07-07 MED ORDER — PHENAZOPYRIDINE HCL 200 MG PO TABS: 200.0000 mg | ORAL_TABLET | Freq: Three times a day (TID) | ORAL | Status: DC | PRN

## 2014-07-07 MED ORDER — IOHEXOL 350 MG/ML SOLN
INTRAVENOUS | Status: DC | PRN
  Administered 2014-07-07: 10 mL

## 2014-07-07 MED ORDER — BELLADONNA ALKALOIDS-OPIUM 16.2-60 MG RE SUPP
RECTAL | Status: AC
  Filled 2014-07-07: qty 1

## 2014-07-07 MED ORDER — ACETAMINOPHEN 650 MG RE SUPP
650.0000 mg | RECTAL | Status: DC | PRN
  Filled 2014-07-07: qty 1

## 2014-07-07 MED ORDER — CIPROFLOXACIN IN D5W 400 MG/200ML IV SOLN
INTRAVENOUS | Status: AC
  Filled 2014-07-07: qty 200

## 2014-07-07 MED ORDER — PROMETHAZINE HCL 25 MG/ML IJ SOLN
6.2500 mg | INTRAMUSCULAR | Status: DC | PRN
  Filled 2014-07-07: qty 1

## 2014-07-07 MED ORDER — ACETAMINOPHEN 325 MG PO TABS
650.0000 mg | ORAL_TABLET | ORAL | Status: DC | PRN
  Filled 2014-07-07: qty 2

## 2014-07-07 MED ORDER — DEXAMETHASONE SODIUM PHOSPHATE 4 MG/ML IJ SOLN
INTRAMUSCULAR | Status: DC | PRN
  Administered 2014-07-07: 10 mg via INTRAVENOUS

## 2014-07-07 MED ORDER — SODIUM CHLORIDE 0.9 % IJ SOLN
3.0000 mL | INTRAMUSCULAR | Status: DC | PRN
  Filled 2014-07-07: qty 3

## 2014-07-07 MED ORDER — FENTANYL CITRATE 0.05 MG/ML IJ SOLN
25.0000 ug | INTRAMUSCULAR | Status: DC | PRN
  Filled 2014-07-07: qty 1

## 2014-07-07 MED ORDER — FENTANYL CITRATE 0.05 MG/ML IJ SOLN
INTRAMUSCULAR | Status: AC
  Filled 2014-07-07: qty 4

## 2014-07-07 MED ORDER — SODIUM CHLORIDE 0.9 % IR SOLN
Status: DC | PRN
  Administered 2014-07-07: 9000 mL via INTRAVESICAL

## 2014-07-07 MED ORDER — OXYCODONE HCL 5 MG PO TABS
5.0000 mg | ORAL_TABLET | ORAL | Status: DC | PRN
  Filled 2014-07-07: qty 2

## 2014-07-07 MED ORDER — ACETAMINOPHEN 10 MG/ML IV SOLN
INTRAVENOUS | Status: DC | PRN
  Administered 2014-07-07: 1000 mg via INTRAVENOUS

## 2014-07-07 MED ORDER — CIPROFLOXACIN IN D5W 400 MG/200ML IV SOLN
400.0000 mg | INTRAVENOUS | Status: AC
  Administered 2014-07-07: 400 mg via INTRAVENOUS
  Filled 2014-07-07: qty 200

## 2014-07-07 MED ORDER — SODIUM CHLORIDE 0.9 % IV SOLN
250.0000 mL | INTRAVENOUS | Status: DC | PRN
  Filled 2014-07-07: qty 250

## 2014-07-07 SURGICAL SUPPLY — 45 items
BAG DRAIN URO-CYSTO SKYTR STRL (DRAIN) ×4 IMPLANT
BASKET LASER NITINOL 1.9FR (BASKET) IMPLANT
BASKET SEGURA 3FR (UROLOGICAL SUPPLIES) IMPLANT
BASKET STNLS GEMINI 4WIRE 3FR (BASKET) IMPLANT
BASKET ZERO TIP NITINOL 2.4FR (BASKET) IMPLANT
CANISTER SUCT LVC 12 LTR MEDI- (MISCELLANEOUS) ×4 IMPLANT
CATH URET 5FR 28IN CONE TIP (BALLOONS)
CATH URET 5FR 28IN OPEN ENDED (CATHETERS) IMPLANT
CATH URET 5FR 70CM CONE TIP (BALLOONS) IMPLANT
CLOTH BEACON ORANGE TIMEOUT ST (SAFETY) ×4 IMPLANT
DRAPE CAMERA CLOSED 9X96 (DRAPES) ×4 IMPLANT
ELECT REM PT RETURN 9FT ADLT (ELECTROSURGICAL)
ELECTRODE REM PT RTRN 9FT ADLT (ELECTROSURGICAL) IMPLANT
FIBER LASER FLEXIVA 1000 (UROLOGICAL SUPPLIES) ×4 IMPLANT
FIBER LASER FLEXIVA 200 (UROLOGICAL SUPPLIES) IMPLANT
FIBER LASER FLEXIVA 365 (UROLOGICAL SUPPLIES) IMPLANT
FIBER LASER FLEXIVA 550 (UROLOGICAL SUPPLIES) IMPLANT
FIBER LASER TRAC TIP (UROLOGICAL SUPPLIES) ×4 IMPLANT
FORCEP 3 PRONG GRASPING (INSTRUMENTS) ×4 IMPLANT
FORCEPS GRASP N/RETR BSKT (INSTRUMENTS) ×4 IMPLANT
GLOVE BIO SURGEON STRL SZ 6.5 (GLOVE) ×3 IMPLANT
GLOVE BIO SURGEONS STRL SZ 6.5 (GLOVE) ×1
GLOVE INDICATOR 6.5 STRL GRN (GLOVE) ×8 IMPLANT
GLOVE SURG SS PI 8.0 STRL IVOR (GLOVE) ×4 IMPLANT
GOWN PREVENTION PLUS LG XLONG (DISPOSABLE) IMPLANT
GOWN STRL NON-REIN LRG LVL3 (GOWN DISPOSABLE) ×4 IMPLANT
GOWN STRL REIN XL XLG (GOWN DISPOSABLE) IMPLANT
GOWN STRL REUS W/ TWL LRG LVL3 (GOWN DISPOSABLE) ×2 IMPLANT
GOWN STRL REUS W/ TWL XL LVL3 (GOWN DISPOSABLE) ×2 IMPLANT
GOWN STRL REUS W/TWL LRG LVL3 (GOWN DISPOSABLE) ×2
GOWN STRL REUS W/TWL XL LVL3 (GOWN DISPOSABLE) ×2
GUIDEWIRE 0.038 PTFE COATED (WIRE) IMPLANT
GUIDEWIRE ANG ZIPWIRE 038X150 (WIRE) IMPLANT
GUIDEWIRE STR DUAL SENSOR (WIRE) ×8 IMPLANT
IV NS IRRIG 3000ML ARTHROMATIC (IV SOLUTION) ×16 IMPLANT
KIT BALLIN UROMAX 15FX10 (LABEL) IMPLANT
KIT BALLN UROMAX 15FX4 (MISCELLANEOUS) IMPLANT
KIT BALLN UROMAX 26 75X4 (MISCELLANEOUS)
PACK CYSTO (CUSTOM PROCEDURE TRAY) ×4 IMPLANT
SET HIGH PRES BAL DIL (LABEL)
SHEATH ACCESS URETERAL 38CM (SHEATH) ×4 IMPLANT
SHEATH ACCESS URETERAL 54CM (SHEATH) ×4 IMPLANT
SHEATH URET ACCESS 12FR/35CM (UROLOGICAL SUPPLIES) IMPLANT
SHEATH URET ACCESS 12FR/55CM (UROLOGICAL SUPPLIES) IMPLANT
STENT URET 6FRX26 CONTOUR (STENTS) ×4 IMPLANT

## 2014-07-07 NOTE — H&P (View-Only) (Signed)
Active Problems Problems  1. Nephrolithiasis (N20.0) 2. Ureteral stent retained (Z96.0)  History of Present Illness [name] is a 53 yo WM sent in consultation from [facility] for stone disease. He had a renal US for renal insufficiency and he was found to have a 2.8cm bladder stone and mild left hydro with renal stones.  He has a history of stones that was treated endoscopically at [facility] on 01/17/11 by Dr. Glendell Docker.  He has some left flank pain but he has chronic nausea so it is difficult for his to know if it is from the pain. He has had no hematuria.  He has no voiding complaints other than frequency from a diuretic.   Past Medical History Problems  1. History of diabetes mellitus (Z86.39) 2. History of esophageal reflux (Z87.19) 3. History of glaucoma (Z86.69) 4. History of hypercholesterolemia (Z86.39) 5. History of hypertension (Z86.79) 6. History of renal insufficiency syndrome (Z87.448) 7. History of Obstructive sleep apnea, adult (G47.33)  Surgical History Problems  1. History of Back Surgery 2. History of Cystoscopy With Ureteroscopy Left 3. History of Foot Surgery 4. History of Kidney Surgery  Current Meds 1. Crestor 20 MG Oral Tablet;  Therapy: (Recorded:16Nov2015) to Recorded 2. Fenofibrate TABS;  Therapy: (Recorded:16Nov2015) to Recorded 3. Furosemide 20 MG Oral Tablet;  Therapy: (Recorded:16Nov2015) to Recorded 4. GlipiZIDE 10 MG Oral Tablet;  Therapy: (Recorded:16Nov2015) to Recorded 5. Lantus SOLN;  Therapy: (Recorded:16Nov2015) to Recorded 6. Lisinopril 20 MG Oral Tablet;  Therapy: (Recorded:16Nov2015) to Recorded 7. Omeprazole 40 MG Oral Capsule Delayed Release;  Therapy: (Recorded:16Nov2015) to Recorded  Allergies Medication  1. No Known Drug Allergies  Family History Problems  1. Family history of diabetes mellitus (Z83.3) : Maternal Aunt 2. Family history of Throat cancer : Mother  Social History Problems    Denied: History of Alcohol  use   Caffeine use (F15.90)   very little   Never a smoker   Number of children   none   Occupation   none   Single  Review of Systems Genitourinary, constitutional, skin, eye, otolaryngeal, hematologic/lymphatic, cardiovascular, pulmonary, endocrine, musculoskeletal, gastrointestinal, neurological and psychiatric system(s) were reviewed and pertinent findings if present are noted and are otherwise negative.  Genitourinary: nocturia and erectile dysfunction.  Gastrointestinal: nausea and heartburn.  Cardiovascular: leg swelling.  Neurological: dizziness.    Vitals Vital Signs [Data Includes: Last 1 Day]  Recorded: [id number] 09:36AM  Height: 5 ft 10 in Weight: 319 lb  BMI Calculated: 45.77 BSA Calculated: 2.55 Blood Pressure: 117 / 74 Temperature: 98.4 F Heart Rate: 102  Physical Exam Constitutional: Well nourished and well developed . No acute distress.  ENT:. The ears and nose are normal in appearance.  Neck: The appearance of the neck is normal and no neck mass is present.  Pulmonary: No respiratory distress and normal respiratory rhythm and effort.  Cardiovascular: Heart rate and rhythm are normal . No peripheral edema.  Abdomen: The abdomen is obese. The abdomen is soft and nontender. No masses are palpated. No CVA tenderness. No hernias are palpable. No hepatosplenomegaly noted.  Lymphatics: The posterior cervical and supraclavicular nodes are not enlarged or tender.  Skin: Normal skin turgor, no visible rash and no visible skin lesions.  Neuro/Psych:. Mood and affect are appropriate.    Results/Data  Urine [Data Includes: Last 1 Day]   [id number]  COLOR YELLOW   APPEARANCE CLOUDY   SPECIFIC GRAVITY 1.025   pH 5.5   GLUCOSE 100 mg/dL  BILIRUBIN NEG   KETONE  NEG mg/dL  BLOOD LARGE   PROTEIN TRACE mg/dL  UROBILINOGEN 0.2 mg/dL  NITRITE NEG   LEUKOCYTE ESTERASE TRACE   SQUAMOUS EPITHELIAL/HPF RARE   WBC 3-6 WBC/hpf  RBC 21-50 RBC/hpf  BACTERIA FEW    CRYSTALS NONE SEEN   CASTS NONE SEEN    Old records or history reviewed: I have reviewed records from [facility].  The following images/tracing/specimen were independently visualized:  renal US films reviewed and he has a 1cm LLP stone with mild hydro and a 2.8cm probable bladder stone. KUB today shows a retained left ureteral stent with marked encrustation of the stent in the area of the UPJ and proximal coil about 1+cm in diameter. There is encrustation of the distal coil but the stent is intact. There are no gas, soft tissue or significant bony abnormalities. CT today confirms the encrusted stent but the ureteral portion is clean. He has an addition 1cm stone in a posterior calyx in addition to the encrusted intrarenal and bladder portions of the stent. A full report is pending.  The following clinical lab reports were reviewed:  UA reviewed.  The following radiology reports were reviewed: I have reviewed his outside renal US report.  Will request old records/history: I have obtained the imaging records from [facility].  20 Jun 2014 9:07 AM  UA With REFLEX    COLOR YELLOW     APPEARANCE CLOUDY     SPECIFIC GRAVITY 1.025     pH 5.5     GLUCOSE 100     BILIRUBIN NEG     KETONE NEG     BLOOD LARGE     PROTEIN TRACE     UROBILINOGEN 0.2     NITRITE NEG     LEUKOCYTE ESTERASE TRACE     SQUAMOUS EPITHELIAL/HPF RARE     WBC 3-6     CRYSTALS NONE SEEN     CASTS NONE SEEN     RBC 21-50     BACTERIA FEW     Assessment Assessed  1. History of renal insufficiency syndrome (Z87.448) 2. Nephrolithiasis (N20.0) 3. Ureteral stent retained (Z96.0)  He has a retained stent with proximal and distal encrustation.   Plan  Nephrolithiasis  1. URINE CULTURE; Status:Hold For - Specimen/Data Collection,Appointment; Requested  for:16Nov2015;  2. Get Outside Records Office  Follow-up  Status: Hold For - Records,Records Release   Requested for: [id number] Nephrolithiasis, Ureteral stent retained   3. AU CT-STONE PROTOCOL; Status:In Progress - Specimen/Data Collected;   Done:  [id number] 12:00AM Ureteral stent retained  4. Follow-up Schedule Surgery Office  Follow-up  Status: Hold For - Appointment   Requested for: [phone]  I am going to set him up for ESWL of the proximal encrustation and then will take him to the OR for cystoscopy with stent removal and ureteroscopy with stone removal as needed.  I have reviewed the risks of bleeding, infection, ureteral and renal injury, need for additional procedures including a perc, thrombotic events and anesthetic complications.  I will request records from [facility] and Dr. Carl/Dr. Exie Parody regarding his prior procedure.  KUB; Status:Resulted - Requires Verification;  Done: [id number] 12:00AM Due:18Nov2015; Marked Important;Ordered; Today;  [id number]; Ordered [id number], Matix Henshaw;   Discussion/Summary  CC: Dr. Corliss Parish and Dr. Dione Housekeeper. 1

## 2014-07-07 NOTE — Brief Op Note (Signed)
07/07/2014  8:41 PM  PATIENT:  Oscar Rose  53 y.o. male  PRE-OPERATIVE DIAGNOSIS:  RETAINED STENT WITH ENCRUSTATION   POST-OPERATIVE DIAGNOSIS:  RETAINED STENT WITH ENCRUSTATION   PROCEDURE:  Procedure(s): CYSTO WITH LEFT PORTION STENT REMOVAL (Left) CYSTOLITHALOPAXY URETEROSCOPY WITH STENT (Left) HOLMIUM LASER APPLICATION (N/A)  SURGEON:  Surgeon(s) and Role:    * Malka So, MD - Primary  PHYSICIAN ASSISTANT:   ASSISTANTS: none   ANESTHESIA:   general  EBL:     BLOOD ADMINISTERED:none  DRAINS: 6 x 26 left JJ stent   LOCAL MEDICATIONS USED:  NONE  SPECIMEN:  Source of Specimen:  stone and stent fragments  DISPOSITION OF SPECIMEN:  PATHOLOGY  COUNTS:  YES  TOURNIQUET:  * No tourniquets in log *  DICTATION: .Other Dictation: Dictation Number [id number]  PLAN OF CARE: Discharge to home after PACU  PATIENT DISPOSITION:  PACU - hemodynamically stable.   Delay start of Pharmacological VTE agent (>24hrs) due to surgical blood loss or risk of bleeding: not applicable

## 2014-07-07 NOTE — Anesthesia Preprocedure Evaluation (Addendum)
Anesthesia Evaluation  Patient identified by MRN, date of birth, ID band Patient awake    Reviewed: Allergy & Precautions, H&P , NPO status , Patient's Chart, lab work & pertinent test results  Airway Mallampati: III  TM Distance: <3 FB Neck ROM: Full    Dental no notable dental hx.    Pulmonary sleep apnea ,  breath sounds clear to auscultation  + decreased breath sounds      Cardiovascular hypertension, Pt. on medications Rhythm:Regular Rate:Normal  Raw Data Images: Mild diaphragmatic attenuation. Normal left  ventricular size. Stress Images: Normal homogeneous uptake in all areas of the  myocardium. Rest Images: Normal homogeneous uptake in all areas of the  myocardium. Subtraction (SDS): No evidence of ischemia. LV Wall Motion: NL LV Function; NL Wall Motion or EF 58%  Impression Exercise Capacity: Lexiscan with no exercise. BP Response: Normal blood pressure response. Clinical Symptoms: No significant symptoms noted. ECG Impression: No significant ECG changes with Lexiscan. Comparison with Prior Nuclear Study: No previous nuclear study  performed   Overall Impression: Low risk stress nuclear study with mild  diaphragmatic attenuation artifact.    Neuro/Psych negative neurological ROS  negative psych ROS   GI/Hepatic negative GI ROS, Neg liver ROS,   Endo/Other  diabetes, Insulin DependentMorbid obesity  Renal/GU Renal InsufficiencyRenal disease  negative genitourinary   Musculoskeletal negative musculoskeletal ROS (+)   Abdominal (+) + obese,   Peds negative pediatric ROS (+)  Hematology negative hematology ROS (+)   Anesthesia Other Findings   Reproductive/Obstetrics negative OB ROS                           Anesthesia Physical Anesthesia Plan  ASA: III  Anesthesia Plan: General   Post-op Pain Management:    Induction: Intravenous  Airway Management  Planned: LMA and Oral ETT  Additional Equipment:   Intra-op Plan:   Post-operative Plan:   Informed Consent: I have reviewed the patients History and Physical, chart, labs and discussed the procedure including the risks, benefits and alternatives for the proposed anesthesia with the patient or authorized representative who has indicated his/her understanding and acceptance.   Dental advisory given  Plan Discussed with: CRNA and Surgeon  Anesthesia Plan Comments:        Anesthesia Quick Evaluation

## 2014-07-07 NOTE — Transfer of Care (Signed)
Immediate Anesthesia Transfer of Care Note  Patient: GAVYNN POLE  Procedure(s) Performed: Procedure(s) (LRB): CYSTO WITH LEFT STENT REMOVAL (Left) CYSTOLITHALOPAXY/POSSIBLE URETEROSCOPY WITH STENT (Left) HOLMIUM LASER APPLICATION (N/A)  Patient Location: PACU  Anesthesia Type: General  Level of Consciousness: awake, alert  and oriented  Airway & Oxygen Therapy: Patient Spontanous Breathing and Patient connected to face mask oxygen  Post-op Assessment: Report given to PACU RN and Post -op Vital signs reviewed and stable  Post vital signs: Reviewed and stable  Complications: No apparent anesthesia complications

## 2014-07-07 NOTE — Discharge Instructions (Addendum)
Ureteral Stent Implantation Ureteral stent implantation is the implantation of a soft plastic tube with multiple holes into the tube that drains urine from your kidney to your bladder (ureter). The stent helps drain your kidney when there is a blockage of the flow of urine in your ureter. The stent has a coil on each end to keep it from falling out. One end stays in the kidney. The other end stays in the bladder. It is most often taken out after any blockage has been removed or your ureter has healed. Short-term stents have a string attached to make removal quite easy. Removal of a short-term stent can be done in your health care provider's office or by you at home. Long-term stents need to be changed every few months. LET [facility] CARE PROVIDER KNOW ABOUT:  Any allergies you have.  All medicines you are taking, including vitamins, herbs, eye drops, creams, and over-the-counter medicines.  Previous problems you or members of your family have had with the use of anesthetics.  Any blood disorders you have.  Previous surgeries you have had.  Medical conditions you have. RISKS AND COMPLICATIONS Generally, ureteral stent implantation is a safe procedure. However, as with any procedure, complications can occur. Possible complications include:  Movement of the stent away from where it was originally placed (migration). This may affect the ability of the stent to properly drain your kidney. If migration of the stent occurs, the stent may need to be replaced or repositioned.  Perforation of the ureter.  Infection. BEFORE THE PROCEDURE  You may be asked to wash your genital area with sterile soap the morning of your procedure.  You may be given an oral antibiotic which you should take with a sip of water as prescribed by your health care provider.  You may be asked to not eat or drink for 8 hours before the surgery. PROCEDURE  First you will be given an anesthetic so you do not feel pain  during the procedure.  Your health care provider will insert a special lighted instrument called a cystoscope into your bladder. This allows your health care provider to see the opening to your ureter.  A thin wire is carefully threaded into your bladder and up the ureter. The stent is inserted over the wire and the wire is then removed.  Your bladder will be emptied of urine. AFTER THE PROCEDURE You will be taken to a recovery room until it is okay for you to go home. Document Released: 07/19/2000 Document Revised: 07/27/2013 Document Reviewed: 12/29/2012 [facility] Patient Information 2015 [place], [place]. This information is not intended to replace advice given to you by your health care provider. Make sure you discuss any questions you have with your health care provider.   Post Anesthesia Home Care Instructions  Activity: Get plenty of rest for the remainder of the day. A responsible adult should stay with you for 24 hours following the procedure.  For the next 24 hours, DO NOT: -Drive a car -Paediatric nurse -Drink alcoholic beverages -Take any medication unless instructed by your physician -Make any legal decisions or sign important papers.  Meals: Start with liquid foods such as gelatin or soup. Progress to regular foods as tolerated. Avoid greasy, spicy, heavy foods. If nausea and/or vomiting occur, drink only clear liquids until the nausea and/or vomiting subsides. Call your physician if vomiting continues.  Special Instructions/Symptoms: Your throat may feel dry or sore from the anesthesia or the breathing tube placed in your throat during  surgery. If this causes discomfort, gargle with warm salt water. The discomfort should disappear within 24 hours.

## 2014-07-07 NOTE — Anesthesia Procedure Notes (Signed)
Procedure Name: LMA Insertion Date/Time: 07/07/2014 12:35 PM Performed by: Mechele Claude Pre-anesthesia Checklist: Patient identified, Emergency Drugs available, Suction available and Patient being monitored Patient Re-evaluated:Patient Re-evaluated prior to inductionOxygen Delivery Method: Circle System Utilized Preoxygenation: Pre-oxygenation with 100% oxygen Intubation Type: IV induction Ventilation: Mask ventilation without difficulty LMA: LMA with gastric port inserted LMA Size: 5.0 Number of attempts: 1 Placement Confirmation: positive ETCO2 Tube secured with: Tape Dental Injury: Teeth and Oropharynx as per pre-operative assessment

## 2014-07-07 NOTE — Interval H&P Note (Signed)
History and Physical Interval Note:  07/07/2014 12:14 PM  Oscar Rose  has presented today for surgery, with the diagnosis of RETAINED STENT WITH ENCRUSTATION   The various methods of treatment have been discussed with the patient and family. After consideration of risks, benefits and other options for treatment, the patient has consented to  Procedure(s): CYSTO WITH LEFT STENT REMOVAL (Left) POSSIBLE CYSTOLITHALOPAXY/POSSIBLE URETEROSCOPY WITH STENT (Left) HOLMIUM LASER APPLICATION (N/A) as a surgical intervention .  The patient's history has been reviewed, patient examined, no change in status, stable for surgery.  I have reviewed the patient's chart and labs.  Questions were answered to the patient's satisfaction.     Akshith Moncus J

## 2014-07-07 NOTE — Anesthesia Postprocedure Evaluation (Signed)
  Anesthesia Post-op Note  [name]  Procedure(s) Performed: Procedure(s) (LRB): CYSTO WITH LEFT STENT REMOVAL (Left) CYSTOLITHALOPAXY/POSSIBLE URETEROSCOPY WITH STENT (Left) HOLMIUM LASER APPLICATION (N/A)  Patient Location: PACU  Anesthesia Type: General  Level of Consciousness: awake and alert   Airway and Oxygen Therapy: Patient Spontanous Breathing  Post-op Pain: mild  Post-op Assessment: Post-op Vital signs reviewed, Patient's Cardiovascular Status Stable, Respiratory Function Stable, Patent Airway and No signs of Nausea or vomiting  Last Vitals:  Filed Vitals:   07/07/14 1448  BP: 129/64  Pulse: 89  Temp: 36.5 C  Resp: 14    Post-op Vital Signs: stable   Complications: No apparent anesthesia complications

## 2014-07-08 NOTE — Op Note (Deleted)
NAMETHEADOR, FITHEN                ACCOUNT NO.:  [id number]  MEDICAL RECORD NO.:  [id number]  LOCATION:                               FACILITY:  [facility]  PHYSICIAN:  Marshall Cork. Jeffie Pollock, M.D.    DATE OF BIRTH:  [birth year]  DATE OF PROCEDURE:  07/07/2014 DATE OF DISCHARGE:  07/07/2014                              OPERATIVE REPORT   PROCEDURES: 1. Cystoscopy with cystolitholapaxy of a 2-cm stone. 2. Left retrograde pyelogram with interpretation. 3. Left ureteroscopy with holmium lasertripsy with removal of     encrusted stent fragments. 4. Placement of double-J stent.  PREOPERATIVE DIAGNOSIS:  Retained stent with incrustation.  POSTOPERATIVE DIAGNOSIS:  Retained stent with incrustation.  SURGEON:  Marshall Cork. Jeffie Pollock, M.D.  ANESTHESIA:  General.  BLOOD LOSS:  Minimal.  DRAINS:  A 6-French 26-cm double-J stent.  COMPLICATIONS:  None.  INDICATIONS:  Oscar Rose is a 53 year old white male, who had a stent placed 3 years ago during his stone extraction and was not aware that it was left indwelling.  So, he felt followup until recent visit to medical practitioner where an ultrasound reveals stones in the kidneys and bladder.  I saw him in my office and obtained a KUB which, revealed the stent in position with proximal and distal incrustation.  The extent of incrustation was then assessed with CT and it appeared to be limited to the distal loop and very proximal limb within the renal pelvis and around the proximal loop.  It was felt that a combination of cysto and ureteroscopy would be required for removal.  FINDINGS OF PROCEDURE:  He had previously undergone lithotripsy on Monday and was taken to the operating room today after receiving Cipro. General anesthetic was induced.  He was placed in lithotomy position. His perineum and genitalia were prepped with Betadine solution and draped in usual sterile fashion.  Cystoscopy was performed using the 22-French scope and 12-degree lens. Examination  revealed a normal urethra.  The external sphincter was intact.  The prostatic urethra assured bilobar hyperplasia without obstruction.  Examination of the bladder revealed a normal right ureteral orifice.  There was a stent exiting the left ureteral orifice. It was heavily encrusted with JM stone material.  No tumors were identified, but there was some irritation on the posterior wall mucosa from the encrusted stent.  At this point, a 1000 micron holmium laser fiber was inserted.  The power was initially set on 0.5 watts and 50 hertz and the stone was engaged.  It was fragmented, but was rather durable, so the power was increased to 1 watts, which increased progression of fragmentation. Eventually, the stone was removed from the stent and fragmented sufficiently to be removed through the cystoscope sheath.  Once all stone fragments had been removed, a 5-French open-end catheter was passed up the left ureteral orifice and contrast was instilled.  This revealed some narrowing at about the level of the iliacs, but no other area suspicious for heavily encrusted stent.  I grasped the stent with the grasping forceps and tried to remove it; however, it did not become freely, so I did not force the issue.  At this point, a guidewire was  placed through the open-end catheter to the kidney to provide safety wire and a 6.4-French short ureteroscope was inserted alongside the wire and stent.  I was able to pass it to the level of the iliacs, but not get beyond that point due to some narrowing in this location.  At this point, a 35-cm digital access sheath inner core was passed over the wire, I was able to get that to the level of the iliacs and slightly beyond.  The inner core was then reassembled with the outer sheath and inserted to the same level.  The sheath and wire were then removed and inspection was performed.  Inspection was performed with the digital flexible ureteroscope.  I was able  to advance this to the proximal ureter, but was not able to reach the renal pelvis or the area, which had incrustation.  At this point, I felt that the most prudent course of action would be to use the holmium laser to divide the stent, so the distal limb could be removed, so the percutaneous approach to the kidney be required for removal of the remainder.  The stent was then engaged with a 200-micron laser fiber on 1 watt and 10 hertz, and was divided.  There were some minor mucosal injury during this procedure, but no significant bleeding.  Initially just the coil of the stent was removed, this was grasped with a NGage basket and removed. There was some difficulty because the tip came to the side of the ureter making the grasping process difficult.  I then advanced the scope more proximally to near the UPJ and divided the stent again with the laser and removed the fragment with the NGage basket.  The two fragments together encompassed only about the distal tip of the stent.  At this point, a fresh guidewire was placed to the kidney and the access sheath was removed.  A second guidewire had been placed earlier.  So, now, there were two guidewires in the kidney and the 55-cm access sheath was then advanced more proximally over the wire of the inner core and the wire were removed.  The digital flexible scope was then advanced through this sheath and the tip of the scope was used to push the remaining stent into the renal pelvis where I could more readily accessed the system.  The wire was then reinserted and the access sheath was actually advanced into the renal pelvis.  The inner core and wire were removed once again and the digital scope was advanced into the kidney and the kidney where appeared to be a large coil of stent that had been pushed proximally.  This stone that I had treated with lithotripsy on Monday remained fairly dense encasing the proximal limb of the stent.  I then  divided the stent at its inferior portion coming out of the encasing stone in this longer section, which was approximately at the middle half of the stent was then removed.  I went back up and reinspected the collecting system and noted that, that there were some fragments proximally from the lithotripsy, but there was still significant encasement approximately 3 cm that appeared in length by approximately 1 cm and at this point, since I had been working for 2 hours, I felt it most prudent to replace the stent and get an x-ray and assessed our next option, which will be either a percutaneous approach to repeat either lithotripsy or ureteroscopy.  So, the ureteroscope was backed out.  The sheath was  removed leaving one guidewire in place.  A fresh 6-French 26-cm double-J stent was then inserted over the wire to the kidney under fluoroscopic guidance.  The wire was removed leaving good coil in the kidney and good coil in the bladder.  The bladder was then drained.  The cystoscope was removed.  The B and O suppository was placed.  The patient was taken down from lithotomy position.  His anesthetic was reversed.  He was moved to the recovery room in stable condition.  There were no complications.     Marshall Cork. Jeffie Pollock, M.D.     JJW/MEDQ  D:  07/07/2014  T:  07/08/2014  Job:  [id number]

## 2014-07-08 NOTE — Op Note (Signed)
NAMEDONAVIN, Oscar Rose                ACCOUNT NO.:  [id number]  MEDICAL RECORD NO.:  [id number]  LOCATION:                               FACILITY:  [facility]  PHYSICIAN:  Marshall Cork. Jeffie Pollock, M.D.    DATE OF BIRTH:  1960/10/14  DATE OF PROCEDURE:  07/07/2014 DATE OF DISCHARGE:  07/07/2014                              OPERATIVE REPORT   PROCEDURES: 1. Cystoscopy with cystolitholapaxy of a 2-cm stone. 2. Left retrograde pyelogram with interpretation. 3. Left ureteroscopy with holmium lasertripsy with removal of     encrusted stent fragments. 4. Placement of double-J stent.  PREOPERATIVE DIAGNOSIS:  Retained stent with incrustation.  POSTOPERATIVE DIAGNOSIS:  Retained stent with incrustation.  SURGEON:  Marshall Cork. Jeffie Pollock, M.D.  ANESTHESIA:  General.  BLOOD LOSS:  Minimal.  DRAINS:  A 6-French 26-cm double-J stent.  COMPLICATIONS:  None.  INDICATIONS:  Oscar Rose is a 53 year old white male, who had a stent placed 3 years ago during his stone extraction and was not aware that it was left indwelling.  So, he felt followup until recent visit to medical practitioner where an ultrasound reveals stones in the kidneys and bladder.  I saw him in my office and obtained a KUB which, revealed the stent in position with proximal and distal incrustation.  The extent of incrustation was then assessed with CT and it appeared to be limited to the distal loop and very proximal limb within the renal pelvis and around the proximal loop.  It was felt that a combination of cysto and ureteroscopy would be required for removal.  FINDINGS OF PROCEDURE:  He had previously undergone lithotripsy on Monday and was taken to the operating room today after receiving Cipro. General anesthetic was induced.  He was placed in lithotomy position. His perineum and genitalia were prepped with Betadine solution and draped in usual sterile fashion.  Cystoscopy was performed using the 22-French scope and 12-degree lens. Examination  revealed a normal urethra.  The external sphincter was intact.  The prostatic urethra assured bilobar hyperplasia without obstruction.  Examination of the bladder revealed a normal right ureteral orifice.  There was a stent exiting the left ureteral orifice. It was heavily encrusted with JM stone material.  No tumors were identified, but there was some irritation on the posterior wall mucosa from the encrusted stent.  At this point, a 1000 micron holmium laser fiber was inserted.  The power was initially set on 0.5 watts and 50 hertz and the stone was engaged.  It was fragmented, but was rather durable, so the power was increased to 1 watts, which increased progression of fragmentation. Eventually, the stone was removed from the stent and fragmented sufficiently to be removed through the cystoscope sheath.  Once all stone fragments had been removed, a 5-French open-end catheter was passed up the left ureteral orifice and contrast was instilled.  This revealed some narrowing at about the level of the iliacs, but no other area suspicious for heavily encrusted stent.  I grasped the stent with the grasping forceps and tried to remove it; however, it did not become freely, so I did not force the issue.  At this point, a guidewire was  placed through the open-end catheter to the kidney to provide safety wire and a 6.4-French short ureteroscope was inserted alongside the wire and stent.  I was able to pass it to the level of the iliacs, but not get beyond that point due to some narrowing in this location.  At this point, a 35-cm digital access sheath inner core was passed over the wire, I was able to get that to the level of the iliacs and slightly beyond.  The inner core was then reassembled with the outer sheath and inserted to the same level.  The sheath and wire were then removed and inspection was performed.  Inspection was performed with the digital flexible ureteroscope.  I was able  to advance this to the proximal ureter, but was not able to reach the renal pelvis or the area, which had incrustation.  At this point, I felt that the most prudent course of action would be to use the holmium laser to divide the stent, so the distal limb could be removed, so the percutaneous approach to the kidney be required for removal of the remainder.  The stent was then engaged with a 200-micron laser fiber on 1 watt and 10 hertz, and was divided.  There were some minor mucosal injury during this procedure, but no significant bleeding.  Initially just the coil of the stent was removed, this was grasped with a NGage basket and removed. There was some difficulty because the tip came to the side of the ureter making the grasping process difficult.  I then advanced the scope more proximally to near the UPJ and divided the stent again with the laser and removed the fragment with the NGage basket.  The two fragments together encompassed only about the distal tip of the stent.  At this point, a fresh guidewire was placed to the kidney and the access sheath was removed.  A second guidewire had been placed earlier.  So, now, there were two guidewires in the kidney and the 55-cm access sheath was then advanced more proximally over the wire of the inner core and the wire were removed.  The digital flexible scope was then advanced through this sheath and the tip of the scope was used to push the remaining stent into the renal pelvis where I could more readily accessed the system.  The wire was then reinserted and the access sheath was actually advanced into the renal pelvis.  The inner core and wire were removed once again and the digital scope was advanced into the kidney and the kidney where appeared to be a large coil of stent that had been pushed proximally.  This stone that I had treated with lithotripsy on Monday remained fairly dense encasing the proximal limb of the stent.  I then  divided the stent at its inferior portion coming out of the encasing stone in this longer section, which was approximately at the middle half of the stent was then removed.  I went back up and reinspected the collecting system and noted that, that there were some fragments proximally from the lithotripsy, but there was still significant encasement approximately 3 cm that appeared in length by approximately 1 cm and at this point, since I had been working for 2 hours, I felt it most prudent to replace the stent and get an x-ray and assessed our next option, which will be either a percutaneous approach to repeat either lithotripsy or ureteroscopy.  So, the ureteroscope was backed out.  The sheath was  removed leaving one guidewire in place.  A fresh 6-French 26-cm double-J stent was then inserted over the wire to the kidney under fluoroscopic guidance.  The wire was removed leaving good coil in the kidney and good coil in the bladder.  The bladder was then drained.  The cystoscope was removed.  The B and O suppository was placed.  The patient was taken down from lithotomy position.  His anesthetic was reversed.  He was moved to the recovery room in stable condition.  There were no complications.     Marshall Cork. Jeffie Pollock, M.D.     JJW/MEDQ  D:  07/07/2014  T:  07/08/2014  Job:  [id number]

## 2014-07-12 ENCOUNTER — Encounter (HOSPITAL_BASED_OUTPATIENT_CLINIC_OR_DEPARTMENT_OTHER): Payer: Self-pay | Admitting: Urology

## 2014-07-12 LAB — STONE ANALYSIS: Stone Weight KSTONE: 0.273 g

## 2014-07-13 ENCOUNTER — Other Ambulatory Visit: Payer: Self-pay | Admitting: Urology

## 2014-07-14 ENCOUNTER — Other Ambulatory Visit: Payer: Self-pay | Admitting: Urology

## 2014-07-14 DIAGNOSIS — N200 Calculus of kidney: Secondary | ICD-10-CM

## 2014-08-12 ENCOUNTER — Encounter (HOSPITAL_COMMUNITY): Payer: Self-pay | Admitting: *Deleted

## 2014-08-15 ENCOUNTER — Other Ambulatory Visit: Payer: Self-pay | Admitting: Radiology

## 2014-08-16 ENCOUNTER — Ambulatory Visit (HOSPITAL_COMMUNITY): Payer: Medicaid Other

## 2014-08-16 ENCOUNTER — Ambulatory Visit (HOSPITAL_COMMUNITY): Payer: Medicaid Other | Admitting: Anesthesiology

## 2014-08-16 ENCOUNTER — Ambulatory Visit (HOSPITAL_COMMUNITY)
Admission: RE | Admit: 2014-08-16 | Discharge: 2014-08-16 | Disposition: A | Discharge status: Home / Self Care | Payer: Medicaid Other | Source: Ambulatory Visit | Attending: Urology | Admitting: Urology

## 2014-08-16 ENCOUNTER — Encounter (HOSPITAL_COMMUNITY)
Admission: RE | Disposition: A | Discharge status: Home / Self Care | Payer: Self-pay | Source: Ambulatory Visit | Attending: Urology

## 2014-08-16 ENCOUNTER — Observation Stay (HOSPITAL_COMMUNITY)
Admission: RE | Admit: 2014-08-16 | Discharge: 2014-08-19 | Disposition: A | Discharge status: Home / Self Care | Payer: Medicaid Other | Source: Ambulatory Visit | Attending: Urology | Admitting: Urology

## 2014-08-16 ENCOUNTER — Encounter (HOSPITAL_COMMUNITY)
Admission: RE | Admit: 2014-08-16 | Discharge: 2014-08-16 | Disposition: A | Discharge status: Home / Self Care | Payer: Medicaid Other | Source: Ambulatory Visit | Attending: Urology | Admitting: Urology

## 2014-08-16 ENCOUNTER — Encounter (HOSPITAL_COMMUNITY): Payer: Self-pay | Admitting: *Deleted

## 2014-08-16 DIAGNOSIS — H548 Legal blindness, as defined in USA: Secondary | ICD-10-CM | POA: Insufficient documentation

## 2014-08-16 DIAGNOSIS — R112 Nausea with vomiting, unspecified: Secondary | ICD-10-CM | POA: Diagnosis not present

## 2014-08-16 DIAGNOSIS — K219 Gastro-esophageal reflux disease without esophagitis: Secondary | ICD-10-CM | POA: Insufficient documentation

## 2014-08-16 DIAGNOSIS — Z89411 Acquired absence of right great toe: Secondary | ICD-10-CM | POA: Insufficient documentation

## 2014-08-16 DIAGNOSIS — Z89412 Acquired absence of left great toe: Secondary | ICD-10-CM | POA: Diagnosis not present

## 2014-08-16 DIAGNOSIS — E785 Hyperlipidemia, unspecified: Secondary | ICD-10-CM | POA: Diagnosis not present

## 2014-08-16 DIAGNOSIS — G2581 Restless legs syndrome: Secondary | ICD-10-CM | POA: Insufficient documentation

## 2014-08-16 DIAGNOSIS — Z794 Long term (current) use of insulin: Secondary | ICD-10-CM | POA: Diagnosis not present

## 2014-08-16 DIAGNOSIS — M1990 Unspecified osteoarthritis, unspecified site: Secondary | ICD-10-CM | POA: Diagnosis not present

## 2014-08-16 DIAGNOSIS — Z6841 Body Mass Index (BMI) 40.0 and over, adult: Secondary | ICD-10-CM | POA: Insufficient documentation

## 2014-08-16 DIAGNOSIS — I129 Hypertensive chronic kidney disease with stage 1 through stage 4 chronic kidney disease, or unspecified chronic kidney disease: Secondary | ICD-10-CM | POA: Diagnosis not present

## 2014-08-16 DIAGNOSIS — N200 Calculus of kidney: Secondary | ICD-10-CM

## 2014-08-16 DIAGNOSIS — E1140 Type 2 diabetes mellitus with diabetic neuropathy, unspecified: Secondary | ICD-10-CM | POA: Diagnosis not present

## 2014-08-16 DIAGNOSIS — F329 Major depressive disorder, single episode, unspecified: Secondary | ICD-10-CM | POA: Insufficient documentation

## 2014-08-16 DIAGNOSIS — Z419 Encounter for procedure for purposes other than remedying health state, unspecified: Secondary | ICD-10-CM

## 2014-08-16 DIAGNOSIS — G4730 Sleep apnea, unspecified: Secondary | ICD-10-CM | POA: Insufficient documentation

## 2014-08-16 DIAGNOSIS — N182 Chronic kidney disease, stage 2 (mild): Secondary | ICD-10-CM | POA: Insufficient documentation

## 2014-08-16 DIAGNOSIS — I10 Essential (primary) hypertension: Secondary | ICD-10-CM

## 2014-08-16 HISTORY — PX: NEPHROLITHOTOMY: SHX5134

## 2014-08-16 LAB — GLUCOSE, CAPILLARY
GLUCOSE-CAPILLARY: 164 mg/dL — AB (ref 70–99)
Glucose-Capillary: 119 mg/dL — ABNORMAL HIGH (ref 70–99)
Glucose-Capillary: 106 mg/dL — ABNORMAL HIGH (ref 70–99)
Glucose-Capillary: 137 mg/dL — ABNORMAL HIGH (ref 70–99)

## 2014-08-16 LAB — CBC WITH DIFFERENTIAL/PLATELET
Basophils Absolute: 0 10*3/uL (ref 0.0–0.1)
Basophils Relative: 0 % (ref 0–1)
Eosinophils Absolute: 0.2 10*3/uL (ref 0.0–0.7)
Eosinophils Relative: 3 % (ref 0–5)
HCT: 38.7 % — ABNORMAL LOW (ref 39.0–52.0)
Hemoglobin: 12.9 g/dL — ABNORMAL LOW (ref 13.0–17.0)
LYMPHS PCT: 30 % (ref 12–46)
Lymphs Abs: 1.7 10*3/uL (ref 0.7–4.0)
MCH: 29.6 pg (ref 26.0–34.0)
MCHC: 33.3 g/dL (ref 30.0–36.0)
MCV: 88.8 fL (ref 78.0–100.0)
Monocytes Absolute: 0.4 10*3/uL (ref 0.1–1.0)
Monocytes Relative: 7 % (ref 3–12)
NEUTROS PCT: 60 % (ref 43–77)
Neutro Abs: 3.5 10*3/uL (ref 1.7–7.7)
Platelets: 237 10*3/uL (ref 150–400)
RBC: 4.36 MIL/uL (ref 4.22–5.81)
RDW: 12.7 % (ref 11.5–15.5)
WBC: 5.7 10*3/uL (ref 4.0–10.5)

## 2014-08-16 LAB — BASIC METABOLIC PANEL
ANION GAP: 8 (ref 5–15)
Anion gap: 9 (ref 5–15)
BUN: 33 mg/dL — ABNORMAL HIGH (ref 6–23)
BUN: 29 mg/dL — ABNORMAL HIGH (ref 6–23)
CALCIUM: 9.4 mg/dL (ref 8.4–10.5)
CO2: 26 mmol/L (ref 19–32)
CO2: 24 mmol/L (ref 19–32)
CREATININE: 1.52 mg/dL — AB (ref 0.50–1.35)
Calcium: 8.8 mg/dL (ref 8.4–10.5)
Chloride: 105 mEq/L (ref 96–112)
Chloride: 102 mEq/L (ref 96–112)
Creatinine, Ser: 1.52 mg/dL — ABNORMAL HIGH (ref 0.50–1.35)
GFR calc Af Amer: 60 mL/min — ABNORMAL LOW (ref >90)
GFR calc Af Amer: 60 mL/min — ABNORMAL LOW (ref >90)
GFR calc non Af Amer: 51 mL/min — ABNORMAL LOW (ref >90)
GFR, EST NON AFRICAN AMERICAN: 51 mL/min — AB (ref >90)
GLUCOSE: 161 mg/dL — AB (ref 70–99)
Glucose, Bld: 113 mg/dL — ABNORMAL HIGH (ref 70–99)
Potassium: 4.5 mmol/L (ref 3.5–5.1)
Potassium: 4.4 mmol/L (ref 3.5–5.1)
SODIUM: 139 mmol/L (ref 135–145)
Sodium: 135 mmol/L (ref 135–145)

## 2014-08-16 LAB — ABO/RH: ABO/RH(D): B NEG

## 2014-08-16 LAB — HEMOGLOBIN AND HEMATOCRIT, BLOOD
HEMATOCRIT: 36.4 % — AB (ref 39.0–52.0)
Hemoglobin: 12.1 g/dL — ABNORMAL LOW (ref 13.0–17.0)

## 2014-08-16 LAB — PROTIME-INR
INR: 0.96 (ref 0.00–1.49)
Prothrombin Time: 12.9 seconds (ref 11.6–15.2)

## 2014-08-16 LAB — TYPE AND SCREEN
ABO/RH(D): B NEG
Antibody Screen: NEGATIVE

## 2014-08-16 SURGERY — NEPHROLITHOTOMY PERCUTANEOUS
Anesthesia: General

## 2014-08-16 MED ORDER — CIPROFLOXACIN IN D5W 400 MG/200ML IV SOLN
400.0000 mg | INTRAVENOUS | Status: AC
  Administered 2014-08-16: 400 mg via INTRAVENOUS

## 2014-08-16 MED ORDER — FENTANYL CITRATE 0.05 MG/ML IJ SOLN
INTRAMUSCULAR | Status: AC
  Filled 2014-08-16: qty 5

## 2014-08-16 MED ORDER — ONDANSETRON HCL 4 MG/2ML IJ SOLN
INTRAMUSCULAR | Status: DC | PRN
  Administered 2014-08-16: 4 mg via INTRAVENOUS

## 2014-08-16 MED ORDER — POTASSIUM CHLORIDE IN NACL 20-0.45 MEQ/L-% IV SOLN
INTRAVENOUS | Status: DC
  Administered 2014-08-16 – 2014-08-17 (×3): via INTRAVENOUS
  Administered 2014-08-18: 125 mL/h via INTRAVENOUS
  Administered 2014-08-18: 05:00:00 via INTRAVENOUS
  Filled 2014-08-16 (×17): qty 1000

## 2014-08-16 MED ORDER — GLYCOPYRROLATE 0.2 MG/ML IJ SOLN
INTRAMUSCULAR | Status: AC
  Filled 2014-08-16: qty 2

## 2014-08-16 MED ORDER — MIDAZOLAM HCL 2 MG/2ML IJ SOLN
INTRAMUSCULAR | Status: AC | PRN
  Administered 2014-08-16 (×2): 1 mg via INTRAVENOUS

## 2014-08-16 MED ORDER — PROPOFOL 10 MG/ML IV BOLUS
INTRAVENOUS | Status: DC | PRN
  Administered 2014-08-16: 200 mg via INTRAVENOUS

## 2014-08-16 MED ORDER — HYDROMORPHONE HCL 1 MG/ML IJ SOLN: 0.5000 mg | INTRAMUSCULAR | Status: DC | PRN

## 2014-08-16 MED ORDER — FENTANYL CITRATE 0.05 MG/ML IJ SOLN
INTRAMUSCULAR | Status: AC | PRN
  Administered 2014-08-16: 25 ug via INTRAVENOUS
  Administered 2014-08-16: 50 ug via INTRAVENOUS

## 2014-08-16 MED ORDER — CISATRACURIUM BESYLATE 20 MG/10ML IV SOLN
INTRAVENOUS | Status: AC
  Filled 2014-08-16: qty 10

## 2014-08-16 MED ORDER — DOCUSATE SODIUM 100 MG PO CAPS
100.0000 mg | ORAL_CAPSULE | Freq: Two times a day (BID) | ORAL | Status: DC
  Administered 2014-08-16 – 2014-08-19 (×5): 100 mg via ORAL
  Filled 2014-08-16 (×7): qty 1

## 2014-08-16 MED ORDER — ONDANSETRON HCL 4 MG/2ML IJ SOLN
INTRAMUSCULAR | Status: AC
  Filled 2014-08-16: qty 2

## 2014-08-16 MED ORDER — LACTATED RINGERS IV SOLN
INTRAVENOUS | Status: DC
  Administered 2014-08-16: 12:00:00 via INTRAVENOUS
  Administered 2014-08-16: 1000 mL via INTRAVENOUS

## 2014-08-16 MED ORDER — NEOSTIGMINE METHYLSULFATE 10 MG/10ML IV SOLN
INTRAVENOUS | Status: DC | PRN
  Administered 2014-08-16: 4 mg via INTRAVENOUS

## 2014-08-16 MED ORDER — IOHEXOL 300 MG/ML  SOLN
INTRAMUSCULAR | Status: DC | PRN
  Administered 2014-08-16: 50 mL

## 2014-08-16 MED ORDER — FENTANYL CITRATE 0.05 MG/ML IJ SOLN
INTRAMUSCULAR | Status: AC
  Administered 2014-08-16: 50 ug via INTRAVENOUS
  Administered 2014-08-16: 100 ug via INTRAVENOUS
  Administered 2014-08-16: 50 ug via INTRAVENOUS
  Filled 2014-08-16: qty 4

## 2014-08-16 MED ORDER — SODIUM CHLORIDE 0.9 % IR SOLN
Status: DC | PRN
  Administered 2014-08-16: 10000 mL

## 2014-08-16 MED ORDER — FUROSEMIDE 20 MG PO TABS
20.0000 mg | ORAL_TABLET | Freq: Every morning | ORAL | Status: DC
  Administered 2014-08-17 – 2014-08-19 (×2): 20 mg via ORAL
  Filled 2014-08-16 (×2): qty 1

## 2014-08-16 MED ORDER — BISACODYL 10 MG RE SUPP: 10.0000 mg | Freq: Every day | RECTAL | Status: DC | PRN

## 2014-08-16 MED ORDER — INSULIN ASPART 100 UNIT/ML ~~LOC~~ SOLN
0.0000 [IU] | Freq: Three times a day (TID) | SUBCUTANEOUS | Status: DC
Start: 2014-08-16 — End: 2014-08-19
  Administered 2014-08-16: 3 [IU] via SUBCUTANEOUS
  Administered 2014-08-17: 4 [IU] via SUBCUTANEOUS
  Administered 2014-08-17: 3 [IU] via SUBCUTANEOUS
  Administered 2014-08-17: 4 [IU] via SUBCUTANEOUS
  Administered 2014-08-18 – 2014-08-19 (×2): 3 [IU] via SUBCUTANEOUS

## 2014-08-16 MED ORDER — DEXTROSE 5 % IV SOLN
3.0000 g | Freq: Once | INTRAVENOUS | Status: DC
  Filled 2014-08-16: qty 3000

## 2014-08-16 MED ORDER — INSULIN GLARGINE 100 UNIT/ML ~~LOC~~ SOLN
25.0000 [IU] | Freq: Every day | SUBCUTANEOUS | Status: DC
  Administered 2014-08-16 – 2014-08-18 (×3): 25 [IU] via SUBCUTANEOUS
  Filled 2014-08-16 (×3): qty 0.25

## 2014-08-16 MED ORDER — PANTOPRAZOLE SODIUM 40 MG PO TBEC
40.0000 mg | DELAYED_RELEASE_TABLET | Freq: Every day | ORAL | Status: DC
  Administered 2014-08-16 – 2014-08-19 (×3): 40 mg via ORAL
  Filled 2014-08-16 (×4): qty 1

## 2014-08-16 MED ORDER — CISATRACURIUM BESYLATE (PF) 10 MG/5ML IV SOLN
INTRAVENOUS | Status: DC | PRN
  Administered 2014-08-16: 8 mg via INTRAVENOUS

## 2014-08-16 MED ORDER — CIPROFLOXACIN HCL 500 MG PO TABS
500.0000 mg | ORAL_TABLET | Freq: Two times a day (BID) | ORAL | Status: DC
  Administered 2014-08-16 – 2014-08-19 (×5): 500 mg via ORAL
  Filled 2014-08-16 (×7): qty 1

## 2014-08-16 MED ORDER — LISINOPRIL 20 MG PO TABS
20.0000 mg | ORAL_TABLET | Freq: Every morning | ORAL | Status: DC
  Administered 2014-08-17 – 2014-08-19 (×2): 20 mg via ORAL
  Filled 2014-08-16 (×3): qty 1

## 2014-08-16 MED ORDER — CIPROFLOXACIN IN D5W 400 MG/200ML IV SOLN
INTRAVENOUS | Status: AC
  Filled 2014-08-16: qty 200

## 2014-08-16 MED ORDER — MIDAZOLAM HCL 2 MG/2ML IJ SOLN
INTRAMUSCULAR | Status: AC
  Administered 2014-08-16 (×2): 1 mg via INTRAVENOUS
  Filled 2014-08-16: qty 6

## 2014-08-16 MED ORDER — MIDAZOLAM HCL 2 MG/2ML IJ SOLN
INTRAMUSCULAR | Status: AC
  Filled 2014-08-16: qty 2

## 2014-08-16 MED ORDER — GLYCOPYRROLATE 0.2 MG/ML IJ SOLN
INTRAMUSCULAR | Status: DC | PRN
  Administered 2014-08-16: 0.4 mg via INTRAVENOUS

## 2014-08-16 MED ORDER — DIPHENHYDRAMINE HCL 50 MG/ML IJ SOLN: 12.5000 mg | Freq: Four times a day (QID) | INTRAMUSCULAR | Status: DC | PRN

## 2014-08-16 MED ORDER — LIDOCAINE HCL 1 % IJ SOLN
INTRAMUSCULAR | Status: AC
  Filled 2014-08-16: qty 20

## 2014-08-16 MED ORDER — OXYBUTYNIN CHLORIDE 5 MG PO TABS
5.0000 mg | ORAL_TABLET | Freq: Three times a day (TID) | ORAL | Status: DC | PRN
  Administered 2014-08-16: 5 mg via ORAL
  Filled 2014-08-16 (×2): qty 1

## 2014-08-16 MED ORDER — ONDANSETRON HCL 4 MG/2ML IJ SOLN
4.0000 mg | INTRAMUSCULAR | Status: DC | PRN
  Administered 2014-08-16 – 2014-08-17 (×3): 4 mg via INTRAVENOUS
  Filled 2014-08-16 (×3): qty 2

## 2014-08-16 MED ORDER — GLIPIZIDE ER 10 MG PO TB24
10.0000 mg | ORAL_TABLET | Freq: Every day | ORAL | Status: DC
  Administered 2014-08-17 – 2014-08-19 (×2): 10 mg via ORAL
  Filled 2014-08-16 (×4): qty 1

## 2014-08-16 MED ORDER — ACETAMINOPHEN 325 MG PO TABS: 650.0000 mg | ORAL_TABLET | ORAL | Status: DC | PRN

## 2014-08-16 MED ORDER — NEOSTIGMINE METHYLSULFATE 10 MG/10ML IV SOLN
INTRAVENOUS | Status: AC
  Filled 2014-08-16: qty 1

## 2014-08-16 MED ORDER — HYDROMORPHONE HCL 1 MG/ML IJ SOLN: 0.2500 mg | INTRAMUSCULAR | Status: DC | PRN

## 2014-08-16 MED ORDER — FENOFIBRATE 54 MG PO TABS
54.0000 mg | ORAL_TABLET | Freq: Every day | ORAL | Status: DC
  Administered 2014-08-16 – 2014-08-19 (×3): 54 mg via ORAL
  Filled 2014-08-16 (×4): qty 1

## 2014-08-16 MED ORDER — PROPOFOL 10 MG/ML IV BOLUS
INTRAVENOUS | Status: AC
  Filled 2014-08-16: qty 20

## 2014-08-16 MED ORDER — LIDOCAINE HCL (CARDIAC) 20 MG/ML IV SOLN
INTRAVENOUS | Status: AC
  Filled 2014-08-16: qty 5

## 2014-08-16 MED ORDER — ROSUVASTATIN CALCIUM 20 MG PO TABS
20.0000 mg | ORAL_TABLET | Freq: Every evening | ORAL | Status: DC
  Administered 2014-08-16 – 2014-08-18 (×3): 20 mg via ORAL
  Filled 2014-08-16 (×4): qty 1

## 2014-08-16 MED ORDER — DIPHENHYDRAMINE HCL 12.5 MG/5ML PO ELIX: 12.5000 mg | ORAL_SOLUTION | Freq: Four times a day (QID) | ORAL | Status: DC | PRN

## 2014-08-16 MED ORDER — LIDOCAINE HCL (CARDIAC) 20 MG/ML IV SOLN
INTRAVENOUS | Status: DC | PRN
  Administered 2014-08-16: 100 mg via INTRAVENOUS

## 2014-08-16 MED ORDER — IOHEXOL 300 MG/ML  SOLN
10.0000 mL | Freq: Once | INTRAMUSCULAR | Status: AC | PRN
  Administered 2014-08-16: 10 mL

## 2014-08-16 MED ORDER — LACTATED RINGERS IV SOLN
INTRAVENOUS | Status: DC
Start: 2014-08-16 — End: 2014-08-16

## 2014-08-16 MED ORDER — OXYCODONE-ACETAMINOPHEN 5-325 MG PO TABS: 1.0000 | ORAL_TABLET | ORAL | Status: DC | PRN

## 2014-08-16 MED ORDER — SODIUM CHLORIDE 0.9 % IV SOLN
INTRAVENOUS | Status: DC
  Administered 2014-08-16: 10:00:00 via INTRAVENOUS

## 2014-08-16 MED ORDER — SUCCINYLCHOLINE CHLORIDE 20 MG/ML IJ SOLN
INTRAMUSCULAR | Status: DC | PRN
  Administered 2014-08-16: 150 mg via INTRAVENOUS

## 2014-08-16 MED ORDER — ZOLPIDEM TARTRATE 5 MG PO TABS: 5.0000 mg | ORAL_TABLET | Freq: Every evening | ORAL | Status: DC | PRN

## 2014-08-16 SURGICAL SUPPLY — 51 items
BAG URINE DRAINAGE (UROLOGICAL SUPPLIES) ×4 IMPLANT
BASKET ZERO TIP NITINOL 2.4FR (BASKET) ×4 IMPLANT
BENZOIN TINCTURE PRP APPL 2/3 (GAUZE/BANDAGES/DRESSINGS) ×4 IMPLANT
BLADE SURG 15 STRL LF DISP TIS (BLADE) ×2 IMPLANT
BLADE SURG 15 STRL SS (BLADE) ×2
CARTRIDGE STONEBREAK CO2 KIDNE (ELECTROSURGICAL) IMPLANT
CATH AINSWORTH 30CC 24FR (CATHETERS) ×4 IMPLANT
CATH BEACON 5.038 65CM KMP-01 (CATHETERS) ×4 IMPLANT
CATH ROBINSON RED A/P 20FR (CATHETERS) IMPLANT
CATH URET 5FR 28IN OPEN ENDED (CATHETERS) IMPLANT
CATH URET DUAL LUMEN 6-10FR 50 (CATHETERS) ×4 IMPLANT
CATH X-FORCE N30 NEPHROSTOMY (TUBING) ×4 IMPLANT
COVER SURGICAL LIGHT HANDLE (MISCELLANEOUS) ×4 IMPLANT
DRAPE C-ARM 42X120 X-RAY (DRAPES) ×4 IMPLANT
DRAPE CAMERA CLOSED 9X96 (DRAPES) ×4 IMPLANT
DRAPE LINGEMAN PERC (DRAPES) ×4 IMPLANT
DRAPE SURG IRRIG POUCH 19X23 (DRAPES) ×4 IMPLANT
DRSG PAD ABDOMINAL 8X10 ST (GAUZE/BANDAGES/DRESSINGS) ×4 IMPLANT
DRSG TEGADERM 8X12 (GAUZE/BANDAGES/DRESSINGS) ×4 IMPLANT
FIBER LASER FLEXIVA 1000 (UROLOGICAL SUPPLIES) IMPLANT
FIBER LASER FLEXIVA 200 (UROLOGICAL SUPPLIES) IMPLANT
FIBER LASER FLEXIVA 365 (UROLOGICAL SUPPLIES) IMPLANT
FIBER LASER FLEXIVA 550 (UROLOGICAL SUPPLIES) IMPLANT
FIBER LASER TRAC TIP (UROLOGICAL SUPPLIES) IMPLANT
GAUZE SPONGE 4X4 12PLY STRL (GAUZE/BANDAGES/DRESSINGS) ×4 IMPLANT
GLOVE SURG SS PI 8.0 STRL IVOR (GLOVE) IMPLANT
GOWN STRL REUS W/TWL XL LVL3 (GOWN DISPOSABLE) ×4 IMPLANT
GUIDEWIRE AMPLAZ .035X145 (WIRE) ×12 IMPLANT
KIT BALLIN UROMAX 15FX10 (LABEL) ×2 IMPLANT
KIT BASIN OR (CUSTOM PROCEDURE TRAY) ×4 IMPLANT
MANIFOLD NEPTUNE II (INSTRUMENTS) ×4 IMPLANT
MASK EYE SHIELD (GAUZE/BANDAGES/DRESSINGS) ×4 IMPLANT
NS IRRIG 1000ML POUR BTL (IV SOLUTION) IMPLANT
PACK BASIC VI WITH GOWN DISP (CUSTOM PROCEDURE TRAY) ×4 IMPLANT
PROBE KIDNEY STONEBRKR 2.0X425 (ELECTROSURGICAL) IMPLANT
PROBE LITHOCLAST ULTRA 3.8X403 (UROLOGICAL SUPPLIES) IMPLANT
PROBE PNEUMATIC 1.0MMX570MM (UROLOGICAL SUPPLIES) IMPLANT
SET HIGH PRES BAL DIL (LABEL) ×2
SET IRRIG Y TYPE TUR BLADDER L (SET/KITS/TRAYS/PACK) ×4 IMPLANT
SHEATH X FORCE 10MMX22CM (SHEATH) ×4 IMPLANT
SPONGE LAP 4X18 X RAY DECT (DISPOSABLE) ×4 IMPLANT
STONE CATCHER W/TUBE ADAPTER (UROLOGICAL SUPPLIES) IMPLANT
SUT SILK 2 0 30  PSL (SUTURE) ×2
SUT SILK 2 0 30 PSL (SUTURE) ×2 IMPLANT
SYR 20CC LL (SYRINGE) ×8 IMPLANT
SYRINGE 10CC LL (SYRINGE) ×4 IMPLANT
TOWEL OR 17X26 10 PK STRL BLUE (TOWEL DISPOSABLE) ×4 IMPLANT
TOWEL OR NON WOVEN STRL DISP B (DISPOSABLE) ×4 IMPLANT
TRAY FOLEY CATH 16FRSI W/METER (SET/KITS/TRAYS/PACK) ×4 IMPLANT
TUBING CONNECTING 10 (TUBING) ×6 IMPLANT
TUBING CONNECTING 10' (TUBING) ×2

## 2014-08-16 NOTE — Procedures (Signed)
L PCN 5 fr No comp

## 2014-08-16 NOTE — Brief Op Note (Signed)
08/16/2014  12:54 PM  PATIENT:  Oscar Rose  54 y.o. male  PRE-OPERATIVE DIAGNOSIS:  LEFT RENAL STONE >2cm/RETAINED STENT  POST-OPERATIVE DIAGNOSIS:  LEFT RENAL STONE >2cm/RETAINED STENT  PROCEDURE:  Procedure(s): LEFT NEPHROLITHOTOMY PERCUTANEOUS (Left) >2cm SURGEON:  Surgeon(s) and Role:    * Malka So, MD - Primary  PHYSICIAN ASSISTANT:   ASSISTANTS: none   ANESTHESIA:   general  EBL:  Total I/O In: 1000 [I.V.:1000] Out: -   BLOOD ADMINISTERED:none  DRAINS: Urinary Catheter (Foley) and 70fr Ainsworth and 73fr Kompe nephrostomy tubes   LOCAL MEDICATIONS USED:  NONE  SPECIMEN:  Source of Specimen:  left renal stone and stent material  DISPOSITION OF SPECIMEN:  Stone fragments given to patient.   Stent and stent fragment discarded.   COUNTS:  YES  TOURNIQUET:  * No tourniquets in log *  DICTATION: .Other Dictation: Dictation Number [id number]  PLAN OF CARE: Admit for overnight observation  PATIENT DISPOSITION:  PACU - hemodynamically stable.   Delay start of Pharmacological VTE agent (>24hrs) due to surgical blood loss or risk of bleeding: yes

## 2014-08-16 NOTE — Anesthesia Preprocedure Evaluation (Addendum)
Anesthesia Evaluation  Patient identified by MRN, date of birth, ID band Patient awake    Reviewed: Allergy & Precautions, H&P , NPO status , Patient's Chart, lab work & pertinent test results  Airway Mallampati: III  TM Distance: <3 FB Neck ROM: Full    Dental no notable dental hx. (+) Teeth Intact, Dental Advisory Given   Pulmonary sleep apnea ,  Stop bang 7 breath sounds clear to auscultation  Pulmonary exam normal + decreased breath sounds      Cardiovascular hypertension, Pt. on medications Rhythm:Regular Rate:Normal  Raw Data Images: Mild diaphragmatic attenuation. Normal left  ventricular size. Stress Images: Normal homogeneous uptake in all areas of the  myocardium. Rest Images: Normal homogeneous uptake in all areas of the  myocardium. Subtraction (SDS): No evidence of ischemia. LV Wall Motion: NL LV Function; NL Wall Motion or EF 58%  Impression Exercise Capacity: Lexiscan with no exercise. BP Response: Normal blood pressure response. Clinical Symptoms: No significant symptoms noted. ECG Impression: No significant ECG changes with Lexiscan. Comparison with Prior Nuclear Study: No previous nuclear study  performed   Overall Impression: Low risk stress nuclear study with mild  diaphragmatic attenuation artifact.    Neuro/Psych negative neurological ROS  negative psych ROS   GI/Hepatic negative GI ROS, Neg liver ROS, GERD-  Medicated and Controlled,  Endo/Other  diabetes, Well Controlled, Type 2, Insulin Dependent, Oral Hypoglycemic AgentsMorbid obesity  Renal/GU Renal InsufficiencyRenal disease  negative genitourinary   Musculoskeletal negative musculoskeletal ROS (+)   Abdominal (+) + obese,   Peds negative pediatric ROS (+)  Hematology negative hematology ROS (+)   Anesthesia Other Findings   Reproductive/Obstetrics negative OB ROS                             Anesthesia Physical Anesthesia Plan  ASA: III  Anesthesia Plan: General   Post-op Pain Management:    Induction: Intravenous  Airway Management Planned: Oral ETT  Additional Equipment:   Intra-op Plan:   Post-operative Plan: Extubation in OR  Informed Consent:   Plan Discussed with: Surgeon  Anesthesia Plan Comments:         Anesthesia Quick Evaluation

## 2014-08-16 NOTE — Anesthesia Postprocedure Evaluation (Signed)
  Anesthesia Post-op Note  [name]  Procedure(s) Performed: Procedure(s) (LRB): LEFT NEPHROLITHOTOMY PERCUTANEOUS (Left)  Patient Location: PACU  Anesthesia Type: General  Level of Consciousness: awake and alert   Airway and Oxygen Therapy: Patient Spontanous Breathing  Post-op Pain: mild  Post-op Assessment: Post-op Vital signs reviewed, Patient's Cardiovascular Status Stable, Respiratory Function Stable, Patent Airway and No signs of Nausea or vomiting  Last Vitals:  Filed Vitals:   08/16/14 1415  BP: 164/75  Pulse: 85  Temp: 36.4 C  Resp: 15    Post-op Vital Signs: stable   Complications: No apparent anesthesia complications

## 2014-08-16 NOTE — Transfer of Care (Signed)
Immediate Anesthesia Transfer of Care Note  [name]  Procedure(s) Performed: Procedure(s): LEFT NEPHROLITHOTOMY PERCUTANEOUS (Left)  Patient Location: PACU  Anesthesia Type:General  Level of Consciousness: awake, alert  and oriented  Airway & Oxygen Therapy: Patient Spontanous Breathing and Patient connected to face mask oxygen  Post-op Assessment: Report given to PACU RN  Post vital signs: Reviewed and stable  Complications: No apparent anesthesia complications

## 2014-08-16 NOTE — H&P (Signed)
Patient Information    Patient Name Sex DOB SSN   Sascha, Bonder Male [birth year] [id number]    H&P by Malka So, MD at 07/04/2014 7:33 AM    Author: Malka So, MD Service: Urology Author Type: Physician   Filed: 07/04/2014 7:35 AM Note Time: 07/04/2014 7:33 AM Status: Signed   Editor: Malka So, MD (Physician)     Expand All Collapse All    Active Problems Problems  1. Nephrolithiasis (N20.0) 2. Ureteral stent retained (Z96.0)  History of Present Illness Mr. Martello is a 54 yo WM sent in consultation from [facility] for stone disease. He had a renal US for renal insufficiency and he was found to have a 2.8cm bladder stone and mild left hydro with renal stones. He has a history of stones that was treated endoscopically at [facility] on 01/17/11 by Dr. Glendell Docker. He has some left flank pain but he has chronic nausea so it is difficult for his to know if it is from the pain. He has had no hematuria. He has no voiding complaints other than frequency from a diuretic.   Past Medical History Problems  1. History of diabetes mellitus (Z86.39) 2. History of esophageal reflux (Z87.19) 3. History of glaucoma (Z86.69) 4. History of hypercholesterolemia (Z86.39) 5. History of hypertension (Z86.79) 6. History of renal insufficiency syndrome (Z87.448) 7. History of Obstructive sleep apnea, adult (G47.33)  Surgical History Problems  1. History of Back Surgery 2. History of Cystoscopy With Ureteroscopy Left 3. History of Foot Surgery 4. History of Kidney Surgery  Current Meds 1. Crestor 20 MG Oral Tablet; Therapy: (Recorded:16Nov2015) to Recorded 2. Fenofibrate TABS; Therapy: (Recorded:16Nov2015) to Recorded 3. Furosemide 20 MG Oral Tablet; Therapy: (Recorded:16Nov2015) to Recorded 4. GlipiZIDE 10 MG Oral Tablet; Therapy: (Recorded:16Nov2015) to Recorded 5. Lantus SOLN; Therapy: (Recorded:16Nov2015) to Recorded 6. Lisinopril 20 MG Oral Tablet; Therapy:  (Recorded:16Nov2015) to Recorded 7. Omeprazole 40 MG Oral Capsule Delayed Release; Therapy: (Recorded:16Nov2015) to Recorded  Allergies Medication  1. No Known Drug Allergies  Family History Problems  1. Family history of diabetes mellitus (Z83.3) : Maternal Aunt 2. Family history of Throat cancer : Mother  Social History Problems   Denied: History of Alcohol use  Caffeine use (F15.90)  very little  Never a smoker  Number of children  none  Occupation  none  Single  Review of Systems Genitourinary, constitutional, skin, eye, otolaryngeal, hematologic/lymphatic, cardiovascular, pulmonary, endocrine, musculoskeletal, gastrointestinal, neurological and psychiatric system(s) were reviewed and pertinent findings if present are noted and are otherwise negative.  Genitourinary: nocturiaanderectile dysfunction.  Gastrointestinal: nauseaandheartburn.  Cardiovascular: leg swelling.  Neurological: dizziness.   Vitals Vital Signs [Data Includes: Last 1 Day]  Recorded: [id number] 09:36AM  Height: 5 ft 10 in Weight: 319 lb  BMI Calculated: 45.77 BSA Calculated: 2.55 Blood Pressure: 117 / 74 Temperature: 98.4 F Heart Rate: 102  Physical Exam Constitutional: Well nourishedandwell developed. No acute distress.  ENT:. The ears and nose are normal in appearance.  Neck: The appearance of the neck is normalandno neck mass is present.  Pulmonary: No respiratory distressandnormal respiratory rhythm and effort.  Cardiovascular: Heart rate and rhythm are normal. No peripheral edema.  Abdomen: The abdomen is obese. The abdomen is soft and nontender. No masses are palpated. No CVA tenderness. No hernias are palpable. No hepatosplenomegaly noted.  Lymphatics: The posterior cervical and supraclavicular nodes are not enlarged or tender.  Skin: Normal skin turgor,no visible rashandno visible skin lesions.  Neuro/Psych:. Mood and affect are appropriate.  Results/Data  Urine [Data Includes: Last 1 Day]   [id number]  COLOR YELLOW   APPEARANCE CLOUDY   SPECIFIC GRAVITY 1.025   pH 5.5   GLUCOSE 100 mg/dL  BILIRUBIN NEG   KETONE NEG mg/dL  BLOOD LARGE   PROTEIN TRACE mg/dL  UROBILINOGEN 0.2 mg/dL  NITRITE NEG   LEUKOCYTE ESTERASE TRACE   SQUAMOUS EPITHELIAL/HPF RARE   WBC 3-6 WBC/hpf  RBC 21-50 RBC/hpf  BACTERIA FEW   CRYSTALS NONE SEEN   CASTS NONE SEEN    Old records or history reviewed: I have reviewed records from [facility].  The following images/tracing/specimen were independently visualized: renal US films reviewed and he has a 1cm LLP stone with mild hydro and a 2.8cm probable bladder stone. KUB today shows a retained left ureteral stent with marked encrustation of the stent in the area of the UPJ and proximal coil about 1+cm in diameter. There is encrustation of the distal coil but the stent is intact. There are no gas, soft tissue or significant bony abnormalities. CT today confirms the encrusted stent but the ureteral portion is clean. He has an addition 1cm stone in a posterior calyx in addition to the encrusted intrarenal and bladder portions of the stent. A full report is pending.  The following clinical lab reports were reviewed: UA reviewed.  The following radiology reports were reviewed: I have reviewed his outside renal US report.  Will request old records/history: I have obtained the imaging records from [place].  20 Jun 2014 9:07 AM  UA With REFLEX   COLOR YELLOW   APPEARANCE CLOUDY   SPECIFIC GRAVITY 1.025   pH 5.5   GLUCOSE 100   BILIRUBIN NEG   KETONE NEG   BLOOD LARGE   PROTEIN TRACE   UROBILINOGEN 0.2   NITRITE NEG   LEUKOCYTE ESTERASE TRACE   SQUAMOUS EPITHELIAL/HPF RARE   WBC 3-6   CRYSTALS NONE SEEN   CASTS NONE SEEN   RBC 21-50   BACTERIA FEW    Assessment Assessed  1. History of renal  insufficiency syndrome (Z87.448) 2. Nephrolithiasis (N20.0) 3. Ureteral stent retained (Z96.0)  He has a retained stent with proximal and distal encrustation.   Plan  Nephrolithiasis  1. URINE CULTURE; Status:Hold For - Specimen/Data Collection,Appointment; Requested for:16Nov2015;  2. Get Outside Records Office Follow-up Status: Hold For - Records,Records Release  Requested for: [id number] Nephrolithiasis, Ureteral stent retained  3. AU CT-STONE PROTOCOL; Status:In Progress - Specimen/Data Collected; Done: [id number] 12:00AM Ureteral stent retained  4. Follow-up Schedule Surgery Office Follow-up Status: Hold For - Appointment  Requested for: [id number]  I am going to set him up for ESWL of the proximal encrustation and then will take him to the OR for cystoscopy with stent removal and ureteroscopy with stone removal as needed. I have reviewed the risks of bleeding, infection, ureteral and renal injury, need for additional procedures including a perc, thrombotic events and anesthetic complications.  I will request records from [facility] and Dr. Carl/Dr. Exie Parody regarding his prior procedure.  KUB; Status:Resulted - Requires Verification; Done: [id number] 12:00AM Due:18Nov2015; Marked Important;Ordered; Today;  [id number]; Ordered [id number], Kemani Heidel;    Addendum:   He has undergo left ESWL and left cystolithalopaxy and ureteroscopy but has a portion of the retained stent that remains heavily encrusted and is going to have a Left PCNL for removal of the stones and stent.   VS and Labs reviewed.

## 2014-08-16 NOTE — H&P (Signed)
Chief Complaint: Left renal stone, retained stent  Referring Physician(s): Wrenn,John J  History of Present Illness: Oscar Rose is a 54 y.o. male with known history of left nephrolithiasis and left ureteral stenting. He was lost to follow up and recently presented with residual left renal stones and encrusted stent. He is s/p cysto with left ureteroscopy/holmium lasertripsy/removal of portion of encrusted stent and placement of new left stent on 07/07/14. He now presents for left nephrostomy prior to left nephrolithotomy/stent fragment removal.   Past Medical History  Diagnosis Date  . Hyperlipidemia   . Diabetic neuropathy   . GERD (gastroesophageal reflux disease)   . Arthritis   . Hypertension   . Depression        . Type 2 diabetes mellitus   . History of kidney stones   . Retained ureteral stent     w/ encrustation SINCE 2012  . 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 History of retinal detachment   . Rotator cuff syndrome of right shoulder   . Restless leg syndrome   . At risk for sleep apnea     STOP-BANG= 7    SENT TO PCP 06-29-2014  . CKD (chronic kidney disease), stage II     montitored by nephrologist    Past Surgical History  Procedure Laterality Date  . 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 Cardiovascular stress test  04-05-2014  dr croitoru    low risk lexiscan nuclear study with mild diaphragmatic attenuation artifact/  no ischemia/  ef 58%  . Tonsillectomy and adenoidectomy  as child  . Cataract extraction w/ intraocular lens  implant, bilateral  2013  . I & d  infected spider bite upper back  06/ 2012  . Retinal detachment surgery Left 2013  . Cysto /  left ureteral stent placement  2012  . Cystoscopy w/ ureteral stent removal Left 07/07/2014    Procedure: CYSTO WITH LEFT PORTION STENT REMOVAL;  Surgeon: Malka So, MD;  Location: [facility];  Service: Urology;  Laterality: Left;  . Cystoscopy with ureteroscopy and stent placement Left 07/07/2014    Procedure: CYSTOLITHALOPAXY URETEROSCOPY WITH STENT;  Surgeon: Malka So, MD;  Location: [facility];  Service: Urology;  Laterality: Left;  . Holmium laser application N/A [id number]    Procedure: HOLMIUM LASER APPLICATION;  Surgeon: Malka So, MD;  Location: [facility];  Service: Urology;  Laterality: N/A;    Allergies: Review of patient's allergies indicates no known allergies.  Medications: Prior to Admission medications   Medication Sig Start Date End Date Taking? Authorizing Provider  fenofibrate (TRICOR) 145 MG tablet Take 145 mg by mouth every morning.    Yes Historical Provider, MD  furosemide (LASIX) 20 MG tablet Take 20 mg by mouth every morning.   Yes Historical Provider, MD  glipiZIDE (GLUCOTROL XL) 10 MG 24 hr tablet Take 10 mg by mouth daily with breakfast.   Yes Historical Provider, MD  insulin glargine (LANTUS) 100 UNIT/ML injection Inject 55 Units into the skin at bedtime.    Yes Historical Provider, MD  lisinopril (PRINIVIL,ZESTRIL) 20 MG tablet Take 20 mg by mouth every morning.   Yes Historical Provider, MD  omeprazole (PRILOSEC) 40 MG capsule Take 40 mg by mouth every morning.    Yes Historical Provider, MD  rosuvastatin (CRESTOR)  20 MG tablet Take 20 mg by mouth every evening.    Yes Historical Provider, MD  acetaminophen (TYLENOL) 500 MG tablet Take 1,000 mg by mouth every 6 (six) hours as needed for mild pain or moderate pain.    Historical Provider, MD  oxyCODONE-acetaminophen (ROXICET) 5-325 MG per tablet Take 1 tablet by mouth every 6 (six) hours as needed for severe pain. Patient not taking: Reported on 08/15/2014 07/07/14   Malka So, MD  phenazopyridine (PYRIDIUM) 200 MG tablet Take 1 tablet (200 mg total) by mouth 3 (three) times daily as needed for pain. Patient not taking: Reported on 08/15/2014 07/07/14   Malka So, MD    Family History  Problem Relation Age of Onset  . Cancer Mother     Throat    History   Social History  . Marital Status: Single    Spouse Name: N/A    Number of Children: 0  . Years of Education: N/A   Social History Main Topics  . Smoking status: Never Smoker   . Smokeless tobacco: Never Used  . Alcohol Use: Yes     Comment: Occassionally  . Drug Use: No  . Sexual Activity: None   Other Topics Concern  . None   Social History Narrative   Lives with "wife"      Review of Systems  Constitutional: Negative for fever and chills.  Eyes:       Diminished vision left eye  Respiratory: Negative for cough and shortness of breath.   Cardiovascular: Negative for chest pain.  Gastrointestinal: Positive for nausea. Negative for vomiting, abdominal pain and blood in stool.  Genitourinary: Negative for dysuria and hematuria.  Musculoskeletal: Positive for back pain.       Rt shoulder pain  Neurological: Positive for headaches.  Hematological: Does not bruise/bleed easily.    Vital Signs: BP 147/91 mmHg  Pulse 85  Temp(Src) 98.4 F (36.9 C) (Oral)  Resp 22  Ht 5\' 10"  (1.778 m)  Wt 315 lb 4 oz (142.996 kg)  BMI 45.23 kg/m2  SpO2 98%  Physical Exam  Constitutional: He is oriented to person, place, and time. He appears well-developed and well-nourished.  Cardiovascular: Normal rate and regular rhythm.   Pulmonary/Chest: Effort normal.  Sl dim BS left base  Abdominal: Soft. Bowel sounds are normal. There is no tenderness.  obese  Musculoskeletal: Normal range of motion. He exhibits edema.  Neurological: He is alert and oriented to person, place, and time.    Imaging: No results found.  Labs:  CBC:  Recent Labs  07/07/14 1214  HGB 12.6*  HCT 37.0*    COAGS: No results for input(s): INR, APTT in the last 8760 hours.  BMP:  Recent Labs  07/07/14 1214  NA 137  K 4.5  GLUCOSE 172*    LIVER FUNCTION TESTS: No results for  input(s): BILITOT, AST, ALT, ALKPHOS, PROT, ALBUMIN in the last 8760 hours.  TUMOR MARKERS: No results for input(s): AFPTM, CEA, CA199, CHROMGRNA in the last 8760 hours.  Assessment and Plan: Oscar Rose is a 54 y.o. male with known history of left nephrolithiasis and left ureteral stenting. He was lost to follow up and recently presented with residual left renal stones and encrusted stent. He is s/p cysto with left ureteroscopy/holmium lasertripsy/removal of portion of encrusted stent and placement of new left stent on 07/07/14. He now presents for left nephrostomy prior to left nephrolithotomy/stent fragment removal. Details/risks of procedure d/w pt with his  understanding and consent.     Signed: Autumn Messing 08/16/2014, 8:59 AM

## 2014-08-17 ENCOUNTER — Encounter (HOSPITAL_COMMUNITY): Payer: Self-pay | Admitting: Radiology

## 2014-08-17 ENCOUNTER — Ambulatory Visit (HOSPITAL_COMMUNITY): Payer: Medicaid Other

## 2014-08-17 DIAGNOSIS — N200 Calculus of kidney: Secondary | ICD-10-CM | POA: Diagnosis not present

## 2014-08-17 LAB — GLUCOSE, CAPILLARY
GLUCOSE-CAPILLARY: 128 mg/dL — AB (ref 70–99)
GLUCOSE-CAPILLARY: 122 mg/dL — AB (ref 70–99)
Glucose-Capillary: 188 mg/dL — ABNORMAL HIGH (ref 70–99)
Glucose-Capillary: 179 mg/dL — ABNORMAL HIGH (ref 70–99)

## 2014-08-17 LAB — BASIC METABOLIC PANEL
ANION GAP: 9 (ref 5–15)
BUN: 27 mg/dL — ABNORMAL HIGH (ref 6–23)
CALCIUM: 8.7 mg/dL (ref 8.4–10.5)
CO2: 26 mmol/L (ref 19–32)
Chloride: 99 mEq/L (ref 96–112)
Creatinine, Ser: 1.48 mg/dL — ABNORMAL HIGH (ref 0.50–1.35)
GFR calc Af Amer: 62 mL/min — ABNORMAL LOW (ref >90)
GFR, EST NON AFRICAN AMERICAN: 53 mL/min — AB (ref >90)
Glucose, Bld: 187 mg/dL — ABNORMAL HIGH (ref 70–99)
Potassium: 4.2 mmol/L (ref 3.5–5.1)
SODIUM: 134 mmol/L — AB (ref 135–145)

## 2014-08-17 LAB — HEMOGLOBIN AND HEMATOCRIT, BLOOD
HCT: 37.8 % — ABNORMAL LOW (ref 39.0–52.0)
Hemoglobin: 12.7 g/dL — ABNORMAL LOW (ref 13.0–17.0)

## 2014-08-17 MED ORDER — METOCLOPRAMIDE HCL 5 MG/ML IJ SOLN
10.0000 mg | Freq: Four times a day (QID) | INTRAMUSCULAR | Status: DC
Start: 2014-08-17 — End: 2014-08-19
  Administered 2014-08-17 – 2014-08-19 (×9): 10 mg via INTRAVENOUS
  Filled 2014-08-17 (×16): qty 2

## 2014-08-17 NOTE — Op Note (Signed)
NAME:  EASTAN, KALUZA                ACCOUNT NO.:  [id number]  MEDICAL RECORD NO.:  [id number]  LOCATION:                                 FACILITY:  PHYSICIAN:  Marshall Cork. Jeffie Pollock, M.D.    DATE OF BIRTH:  [birth year]  DATE OF PROCEDURE:  08/16/2014 DATE OF DISCHARGE:                              OPERATIVE REPORT   PROCEDURE: 1st stage Left percutaneous nephrolithotomy.  PREOPERATIVE DIAGNOSIS:  Greater than 2 cm left renal stone, burden and retained stent material.  POSTOPERATIVE DIAGNOSIS:  Greater than 2 cm left renal stone, burden and retained stent material.  SURGEON:  Marshall Cork. Jeffie Pollock, M.D.  ANESTHESIA:  General.  SPECIMEN:  Stone fragments.  DRAINS:  Foley catheter.  A 24-French Ainsworth left nephrostomy tube. A 6-French Kumpe nephrostomy catheter.  BLOOD LOSS:  100 mL.  COMPLICATIONS:  None.  INDICATIONS:  Hensley is a 54 year old white male with a history of an encrusted retained stent.  The stent was placed approximately 3 years ago at [facility] and he was lost to follow up and presented recently with hematuria and irritative voiding symptoms.  He was found to have heavily encrusted double-J stent with encrustation of the bladder component and the proximal ureteral and renal component.  He had originally undergone lithotripsy of the left renal component. This was followed by cystolitholapaxy for removal of the distal stone material, then ureteroscopy and an attempt to remove more proximal stone material; however, the stone was more durable than expected, and I was unable to completely eliminate the stone via the ureteroscopic approach. I did divide the stent leaving the proximal 5 cm to 6 cm in coil which were more heavily encrusted with stone material.  He additionally had stones in the lower pole of the kidney.  The second stent was placed to maintain ureteral patency at the time of the cystoscopy.  He returns now for percutaneous nephrolithotomy for removal of  the stone and retained stent material.  He was taken the OR earlier today and given Cipro.  He then underwent placement of a left lower pole percutaneous access stick.  He was then taken to the operating room where general anesthetic was induced on the holding room stretcher.  He was fitted with PAS hose, and a Foley catheter was inserted.  He was then rolled prone on chest rolls and care taken to pad all pressure points.  The nephrostomy site was prepped with Betadine solution.  He was draped in usual sterile fashion.  A guidewire was placed through the nephrostomy access catheter, this was advanced to the bladder. A SuperStiff guidewire was used.  The access catheter was then removed and an initial attempt was made to pass a dual- lumen catheter; however, there was resistance at the capsule of the kidney.  At this point, I used a 10 cm 15-French high-pressure ureteral dilation balloon to dilate the level of the capsule to 18 atmospheres.  Once this maneuver had been performed, the dual-lumen catheter could be easily passed into the proximal ureter where a second wire was advanced to the bladder.  The dual-lumen catheter was removed and the nephrostomy incision was then opened to 2 cm  in length.  A NephroMax high-pressure nephrostomy tract balloon was then passed over the working wire into the renal pelvis.  An extra long sheath had been fitted over the balloon prior to passage.  There was an initial inflation of the balloon with the tip in the renal pelvis.  The balloon was then deflated and backed out once full inflation was noted, and the skin and fascia levels were inflated.  The extra long sheath was then advanced over the balloon.  Once it was to the tip of the balloon, the balloon and sheath were advanced together into the renal pelvis.  The balloon was deflated and then removed.  A rigid nephroscope was then passed through the sheath and the kidney was inspected.   Initially stone fragments were found in the lower pole. These had been previously noted on CT and some were passed which was consistent with considerable time in the kidney.  These fragments were multiple and measured from 4 mm to 8 mm in size.  Once this area was clear, I was able to advance the rigid scope into the renal pelvis.  There was a fair amount of edema in this area from the prior procedures and chronic encrusted stent, but it was able to visualize the encrusted stent remnant and removed that with at least 2 cm of stone adherent encrusted around the stent.  Once this had been removed, some additional loose fragments were removed with the rigid scope.  I then pass these 17-French flexible nephroscope into the kidney and inspected the upper calices, 1 significant approximately 8 mm fragment was identified in the upper pole, but using high-pressure irrigation this actually flushed out through the nephrostomy tube into the drainage bag.  Further inspection revealed no residual fragments in the upper pole or in the mid pole calices.  The renal pelvis was reinspected and was clear.  There were a few 4 mm fragments at the nephrostomy tract that were irrigated out were removed in conjunction with some clot.  At this point, final inspection with the rigid scope revealed no residual lower pole fragments.  The nephroscope was removed.  A 24-French Ainsworth Foley catheter was used, placed a nephrostomy catheter, this was placed over the working wire through the access sheath into the renal pelvis under fluoroscopic guidance.  Once in position, the sheath was backed out.  Because it was an extra long sheath, I had to divide the sheath and cut off a portion of it to get the sheath out around the Foley.  Once I was sure this, but before the sheath had been entirely removed, the nephrostomy catheter balloon was inflated with 3 mL of fluid and the wire was removed.  A nephrostogram was  performed using Omnipaque.  The antegrade nephrostogram revealed no evidence of extravasation and antegrade flow.  There was antegrade flow.  The nephrostomy sheath was then removed off the remainder of the catheter and a long 6-French Kumpe catheter was placed over the safety wire just above the bladder in the distal ureter.  The wire was removed and the safety catheter was capped.  Both the safety catheter and the nephrostomy tube were secured in place with a 2-0 vertical mattress silk suture.  The drapes were removed.  The wound was cleansed, the dressing of 4x4s and ABD were applied.  The nephrostomy catheter was placed to straight drainage, then the safety catheter was left capped.  The patient was then rolled back onto the supine onto the recover room  stretcher, his anesthetic was reversed, he was moved to recovery room in stable condition.  There were no complications.     Marshall Cork. Jeffie Pollock, M.D.     JJW/MEDQ  D:  08/16/2014  T:  08/17/2014  Job:  [id number]

## 2014-08-17 NOTE — Progress Notes (Addendum)
Patient ID: Oscar Rose, male   DOB: Dec 01, 1960, 54 y.o.   MRN: [id number] 1 Day Post-Op  Subjective: Oscar Rose is s/p left PCNL for an encrusted stent.   He has no major pain complaints but has had nausea and vomiting through the night.   The post op CT is pending.  His hgb is stable. ROS:  Review of Systems  Constitutional: Negative for fever.  Gastrointestinal: Positive for nausea and vomiting.    Anti-infectives: Anti-infectives    Start     Dose/Rate Route Frequency Ordered Stop   08/16/14 2200  ciprofloxacin (CIPRO) tablet 500 mg     500 mg Oral 2 times daily 08/16/14 1558     08/16/14 0800  ceFAZolin (ANCEF) 3 g in dextrose 5 % 50 mL IVPB  Status:  Discontinued     3 g160 mL/hr over 30 Minutes Intravenous  Once 08/16/14 0752 08/16/14 1151   08/16/14 0753  ciprofloxacin (CIPRO) IVPB 400 mg     400 mg200 mL/hr over 60 Minutes Intravenous 60 min pre-op 08/16/14 0753 08/16/14 1056      Current Facility-Administered Medications  Medication Dose Route Frequency Provider Last Rate Last Dose  . 0.45 % NaCl with KCl 20 mEq / L infusion   Intravenous Continuous Malka So, MD 125 mL/hr at 08/17/14 0055    . acetaminophen (TYLENOL) tablet 650 mg  650 mg Oral Q4H PRN Malka So, MD      . bisacodyl (DULCOLAX) suppository 10 mg  10 mg Rectal Daily PRN Malka So, MD      . ciprofloxacin (CIPRO) tablet 500 mg  500 mg Oral BID Malka So, MD   500 mg at 08/16/14 2055  . diphenhydrAMINE (BENADRYL) injection 12.5 mg  12.5 mg Intravenous Q6H PRN Malka So, MD       Or  . diphenhydrAMINE (BENADRYL) 12.5 MG/5ML elixir 12.5 mg  12.5 mg Oral Q6H PRN Malka So, MD      . docusate sodium (COLACE) capsule 100 mg  100 mg Oral BID Malka So, MD   100 mg at 08/16/14 2055  . fenofibrate tablet 54 mg  54 mg Oral Daily Malka So, MD   54 mg at 08/16/14 1747  . furosemide (LASIX) tablet 20 mg  20 mg Oral q morning - 10a Malka So, MD      . glipiZIDE (GLUCOTROL XL) 24 hr tablet 10 mg  10  mg Oral Q breakfast Malka So, MD      . HYDROmorphone (DILAUDID) injection 0.5-1 mg  0.5-1 mg Intravenous Q2H PRN Malka So, MD      . insulin aspart (novoLOG) injection 0-20 Units  0-20 Units Subcutaneous TID WC Malka So, MD   3 Units at 08/16/14 1747  . insulin glargine (LANTUS) injection 25 Units  25 Units Subcutaneous QHS Malka So, MD   25 Units at 08/16/14 2249  . lisinopril (PRINIVIL,ZESTRIL) tablet 20 mg  20 mg Oral q morning - 10a Malka So, MD      . ondansetron [facility]) injection 4 mg  4 mg Intravenous Q4H PRN Malka So, MD   4 mg at 08/17/14 0136  . oxybutynin (DITROPAN) tablet 5 mg  5 mg Oral Q8H PRN Malka So, MD   5 mg at 08/16/14 1747  . oxyCODONE-acetaminophen (PERCOCET/ROXICET) 5-325 MG per tablet 1-2 tablet  1-2 tablet Oral Q4H PRN Malka So, MD      .  pantoprazole (PROTONIX) EC tablet 40 mg  40 mg Oral Daily Malka So, MD   40 mg at 08/16/14 1823  . rosuvastatin (CRESTOR) tablet 20 mg  20 mg Oral QPM Malka So, MD   20 mg at 08/16/14 1747  . zolpidem (AMBIEN) tablet 5 mg  5 mg Oral QHS PRN,MR X 1 Malka So, MD         Objective: Vital signs in last 24 hours: Temp:  [97.6 F (36.4 C)-98.4 F (36.9 C)] 97.7 F (36.5 C) (01/13 [id number]) Pulse Rate:  [80-111] 111 (01/13 0608) Resp:  [8-22] 16 (01/13 0608) BP: (137-177)/(66-99) 160/74 mmHg (01/13 0608) SpO2:  [94 %-100 %] 99 % (01/13 0608) Weight:  [142.996 kg (315 lb 4 oz)] 142.996 kg (315 lb 4 oz) (01/12 0806)  Intake/Output from previous day: 01/12 0701 - 01/13 0700 In: 3125 [I.V.:3125] Out: 2900 [Urine:2900] Intake/Output this shift: Total I/O In: 502.1 [I.V.:502.1] Out: 2200 [Urine:2200]   Physical Exam  Constitutional: He is well-developed, well-nourished, and in no distress.  Cardiovascular: Normal rate and regular rhythm.   Pulmonary/Chest: Effort normal and breath sounds normal.  Abdominal:  NT is draining pink urine.   Genitourinary:  Foley is draining clear urine.    Vitals reviewed.   Lab Results:   Recent Labs  08/16/14 0820 08/16/14 1629 08/17/14 0353  WBC 5.7  --   --   HGB 12.9* 12.1* 12.7*  HCT 38.7* 36.4* 37.8*  PLT 237  --   --    BMET  Recent Labs  08/16/14 1629 08/17/14 0353  NA 135 134*  K 4.4 4.2  CL 102 99  CO2 24 26  GLUCOSE 161* 187*  BUN 29* 27*  CREATININE 1.52* 1.48*  CALCIUM 8.8 8.7   PT/INR  Recent Labs  08/16/14 0820  LABPROT 12.9  INR 0.96   ABG No results for input(s): PHART, HCO3 in the last 72 hours.  Invalid input(s): PCO2, PO2  Studies/Results: Dg Chest 2 View  08/16/2014   CLINICAL DATA:  Preop for percutaneous nephrolithotomy  EXAM: CHEST  2 VIEW  COMPARISON:  None.  FINDINGS: The heart size and mediastinal contours are within normal limits. Both lungs are clear. The visualized skeletal structures are unremarkable.  IMPRESSION: No active cardiopulmonary disease.   Electronically Signed   By: Kathreen Devoid   On: 08/16/2014 09:50   Dg Abd 1 View  08/16/2014   CLINICAL DATA:  Left percutaneous nephrolithotomy.  EXAM: ABDOMEN - 1 VIEW  COMPARISON:  07/07/2014.  FINDINGS: Left ureteral stent and stent fragment noted. Guidewire noted. Left nephrolithiasis. Percutaneous renal sheath noted. Contrast in the collecting system.  IMPRESSION: Intraoperative findings as above.   Electronically Signed   By: Marcello Moores  Register   On: 08/16/2014 14:37   Dg C-arm 61-120 Min-no Report  08/16/2014   CLINICAL DATA: intra op   C-ARM 61-120 MINUTES  Fluoroscopy was utilized by the requesting physician.  No radiographic  interpretation.    Ir Ureteral Stent Left New Access W/o Sep Nephrostomy Cath  08/16/2014   CLINICAL DATA:  Left ureteral stent fragment.  Left nephrolithiasis.  EXAM: IR URETURAL STENT LEFT NEW ACCESS W/O SEP NEPHROSTOMY CATH  FLUOROSCOPY TIME:  1 min and 24 seconds.  MEDICATIONS AND MEDICAL HISTORY: Versed 1 mg, Fentanyl 50 mcg.  Additional Medications: Ciprofloxacin 400 mg.  ANESTHESIA/SEDATION:  Moderate sedation time: 15 minutes  CONTRAST:  10 cc Omnipaque 300  PROCEDURE: The procedure, risks, benefits, and alternatives were explained to  the patient. Questions regarding the procedure were encouraged and answered. The patient understands and consents to the procedure.  The back was prepped with Betadine in a sterile fashion, and a sterile drape was applied covering the operative field. A sterile gown and sterile gloves were used for the procedure.  1% lidocaine was utilized for local infiltration. Under sonographic guidance, a 22 gauge Chiba needle was inserted into a posterior lower pole calyx. Contrast was injected. It was removed over a 018 wire which was up sized to a 3 J. A Kumpe catheter was advanced over the 3 J wire into the bladder. It was injected with contrast. It was then sewn to the skin.  FINDINGS: Images demonstrate access into the left kidney via posterior lower pole talus. The final image demonstrates a 5 French left nephro ureteral catheter with its tip in the bladder.  COMPLICATIONS: None  IMPRESSION: Successful left nephro ureteral catheter placement in preparation for percutaneous lithotomy.   Electronically Signed   By: Maryclare Bean M.D.   On: 08/16/2014 11:16     Assessment: s/p Procedure(s): LEFT NEPHROLITHOTOMY PERCUTANEOUS  He has had nausea and vomiting through the night but no significant pain.  His Hgb is stable but the post op CT is pending.  Plan: CT today to assess for residual fragments.   If the CT is clear, I will pull the tube and if not I will consider a second look. I am going to start reglan.   The CT shows small LLP fragments with the largest about 4x57mm.   I am going to see about scheduling a second look.    LOS: 1 day    Shylin Keizer J 08/17/2014

## 2014-08-17 NOTE — Progress Notes (Signed)
Patient ID: Oscar Rose, male   DOB: June 18, 1961, 54 y.o.   MRN: [id number]  The CT today shows a few small LLP fragments with the largest about 4x20mm.   We were able to find time on the OR schedule tomorrow and I am going to do a second look PCNL to hopefully get him stone free.

## 2014-08-18 ENCOUNTER — Encounter (HOSPITAL_COMMUNITY)
Admission: RE | Disposition: A | Discharge status: Home / Self Care | Payer: Self-pay | Source: Ambulatory Visit | Attending: Urology

## 2014-08-18 ENCOUNTER — Observation Stay (HOSPITAL_COMMUNITY): Payer: Medicaid Other | Admitting: Certified Registered Nurse Anesthetist

## 2014-08-18 ENCOUNTER — Observation Stay (HOSPITAL_COMMUNITY): Payer: Medicaid Other

## 2014-08-18 DIAGNOSIS — N200 Calculus of kidney: Secondary | ICD-10-CM | POA: Diagnosis not present

## 2014-08-18 DIAGNOSIS — K219 Gastro-esophageal reflux disease without esophagitis: Secondary | ICD-10-CM | POA: Diagnosis not present

## 2014-08-18 DIAGNOSIS — E785 Hyperlipidemia, unspecified: Secondary | ICD-10-CM | POA: Diagnosis not present

## 2014-08-18 DIAGNOSIS — E1140 Type 2 diabetes mellitus with diabetic neuropathy, unspecified: Secondary | ICD-10-CM | POA: Diagnosis not present

## 2014-08-18 HISTORY — PX: NEPHROLITHOTOMY: SHX5134

## 2014-08-18 LAB — GLUCOSE, CAPILLARY
Glucose-Capillary: 104 mg/dL — ABNORMAL HIGH (ref 70–99)
Glucose-Capillary: 107 mg/dL — ABNORMAL HIGH (ref 70–99)
Glucose-Capillary: 115 mg/dL — ABNORMAL HIGH (ref 70–99)
Glucose-Capillary: 148 mg/dL — ABNORMAL HIGH (ref 70–99)
Glucose-Capillary: 200 mg/dL — ABNORMAL HIGH (ref 70–99)

## 2014-08-18 SURGERY — NEPHROLITHOTOMY PERCUTANEOUS SECOND LOOK
Anesthesia: General | Laterality: Left

## 2014-08-18 MED ORDER — ONDANSETRON HCL 4 MG/2ML IJ SOLN
INTRAMUSCULAR | Status: DC | PRN
  Administered 2014-08-18: 4 mg via INTRAVENOUS

## 2014-08-18 MED ORDER — CISATRACURIUM BESYLATE 20 MG/10ML IV SOLN
INTRAVENOUS | Status: AC
  Filled 2014-08-18: qty 10

## 2014-08-18 MED ORDER — SODIUM CHLORIDE 0.9 % IR SOLN
Status: DC | PRN
  Administered 2014-08-18: 6000 mL

## 2014-08-18 MED ORDER — METOCLOPRAMIDE HCL 5 MG/ML IJ SOLN
INTRAMUSCULAR | Status: AC
  Filled 2014-08-18: qty 2

## 2014-08-18 MED ORDER — LACTATED RINGERS IV SOLN: INTRAVENOUS | Status: DC

## 2014-08-18 MED ORDER — LACTATED RINGERS IV SOLN
INTRAVENOUS | Status: DC
  Administered 2014-08-18: 1000 mL via INTRAVENOUS

## 2014-08-18 MED ORDER — GLYCOPYRROLATE 0.2 MG/ML IJ SOLN
INTRAMUSCULAR | Status: AC
  Filled 2014-08-18: qty 4

## 2014-08-18 MED ORDER — PROPOFOL 10 MG/ML IV BOLUS
INTRAVENOUS | Status: DC | PRN
  Administered 2014-08-18: 200 mg via INTRAVENOUS

## 2014-08-18 MED ORDER — FENTANYL CITRATE 0.05 MG/ML IJ SOLN
INTRAMUSCULAR | Status: AC
  Filled 2014-08-18: qty 5

## 2014-08-18 MED ORDER — HYDROMORPHONE HCL 1 MG/ML IJ SOLN
0.2500 mg | INTRAMUSCULAR | Status: DC | PRN
  Administered 2014-08-18: 0.5 mg via INTRAVENOUS

## 2014-08-18 MED ORDER — DEXAMETHASONE SODIUM PHOSPHATE 10 MG/ML IJ SOLN
INTRAMUSCULAR | Status: AC
  Filled 2014-08-18: qty 1

## 2014-08-18 MED ORDER — PROPOFOL 10 MG/ML IV BOLUS
INTRAVENOUS | Status: AC
  Filled 2014-08-18: qty 20

## 2014-08-18 MED ORDER — IOHEXOL 300 MG/ML  SOLN
INTRAMUSCULAR | Status: DC | PRN
  Administered 2014-08-18: 10 mL

## 2014-08-18 MED ORDER — PHENYLEPHRINE 40 MCG/ML (10ML) SYRINGE FOR IV PUSH (FOR BLOOD PRESSURE SUPPORT)
PREFILLED_SYRINGE | INTRAVENOUS | Status: AC
  Filled 2014-08-18: qty 10

## 2014-08-18 MED ORDER — GLYCOPYRROLATE 0.2 MG/ML IJ SOLN
INTRAMUSCULAR | Status: DC | PRN
  Administered 2014-08-18: .8 mg via INTRAVENOUS

## 2014-08-18 MED ORDER — HYDROMORPHONE HCL 1 MG/ML IJ SOLN
INTRAMUSCULAR | Status: AC
  Filled 2014-08-18: qty 1

## 2014-08-18 MED ORDER — SUCCINYLCHOLINE CHLORIDE 20 MG/ML IJ SOLN
INTRAMUSCULAR | Status: DC | PRN
  Administered 2014-08-18: 100 mg via INTRAVENOUS

## 2014-08-18 MED ORDER — MIDAZOLAM HCL 5 MG/5ML IJ SOLN
INTRAMUSCULAR | Status: DC | PRN
  Administered 2014-08-18: 2 mg via INTRAVENOUS

## 2014-08-18 MED ORDER — NEOSTIGMINE METHYLSULFATE 10 MG/10ML IV SOLN
INTRAVENOUS | Status: AC
  Filled 2014-08-18: qty 1

## 2014-08-18 MED ORDER — CIPROFLOXACIN IN D5W 400 MG/200ML IV SOLN
INTRAVENOUS | Status: AC
  Filled 2014-08-18: qty 200

## 2014-08-18 MED ORDER — LACTATED RINGERS IV SOLN
INTRAVENOUS | Status: DC | PRN
  Administered 2014-08-18 (×3): via INTRAVENOUS

## 2014-08-18 MED ORDER — OXYCODONE-ACETAMINOPHEN 5-325 MG PO TABS
1.0000 | ORAL_TABLET | Freq: Four times a day (QID) | ORAL | Status: DC | PRN
Start: 2014-08-18 — End: 2014-11-02

## 2014-08-18 MED ORDER — CIPROFLOXACIN IN D5W 400 MG/200ML IV SOLN: 400.0000 mg | Freq: Once | INTRAVENOUS | Status: DC

## 2014-08-18 MED ORDER — DEXAMETHASONE SODIUM PHOSPHATE 10 MG/ML IJ SOLN
INTRAMUSCULAR | Status: DC | PRN
  Administered 2014-08-18: 10 mg via INTRAVENOUS

## 2014-08-18 MED ORDER — METOCLOPRAMIDE HCL 5 MG/ML IJ SOLN
INTRAMUSCULAR | Status: DC | PRN
  Administered 2014-08-18: 10 mg via INTRAVENOUS

## 2014-08-18 MED ORDER — FENTANYL CITRATE 0.05 MG/ML IJ SOLN
INTRAMUSCULAR | Status: DC | PRN
  Administered 2014-08-18: 100 ug via INTRAVENOUS

## 2014-08-18 MED ORDER — MIDAZOLAM HCL 2 MG/2ML IJ SOLN
INTRAMUSCULAR | Status: AC
  Filled 2014-08-18: qty 2

## 2014-08-18 MED ORDER — ONDANSETRON HCL 4 MG/2ML IJ SOLN
INTRAMUSCULAR | Status: AC
  Filled 2014-08-18: qty 2

## 2014-08-18 MED ORDER — PHENYLEPHRINE HCL 10 MG/ML IJ SOLN
INTRAMUSCULAR | Status: DC | PRN
  Administered 2014-08-18: 120 ug via INTRAVENOUS

## 2014-08-18 MED ORDER — CISATRACURIUM BESYLATE (PF) 10 MG/5ML IV SOLN
INTRAVENOUS | Status: DC | PRN
  Administered 2014-08-18: 6 mg via INTRAVENOUS

## 2014-08-18 MED ORDER — CIPROFLOXACIN IN D5W 400 MG/200ML IV SOLN
400.0000 mg | Freq: Two times a day (BID) | INTRAVENOUS | Status: DC
  Administered 2014-08-18: 400 mg via INTRAVENOUS

## 2014-08-18 MED ORDER — NEOSTIGMINE METHYLSULFATE 10 MG/10ML IV SOLN
INTRAVENOUS | Status: DC | PRN
  Administered 2014-08-18: 5 mg via INTRAVENOUS

## 2014-08-18 SURGICAL SUPPLY — 45 items
BAG URINE DRAINAGE (UROLOGICAL SUPPLIES) IMPLANT
BASKET STONE NCOMPASS (UROLOGICAL SUPPLIES) ×3 IMPLANT
BASKET ZERO TIP NITINOL 2.4FR (BASKET) IMPLANT
BENZOIN TINCTURE PRP APPL 2/3 (GAUZE/BANDAGES/DRESSINGS) ×3 IMPLANT
BLADE SURG 15 STRL LF DISP TIS (BLADE) ×1 IMPLANT
BLADE SURG 15 STRL SS (BLADE) ×2
CATH FOLEY 2W COUNCIL 20FR 5CC (CATHETERS) IMPLANT
CATH ROBINSON RED A/P 20FR (CATHETERS) IMPLANT
CATH URET 5FR 28IN OPEN ENDED (CATHETERS) IMPLANT
CATH X-FORCE N30 NEPHROSTOMY (TUBING) IMPLANT
COVER SURGICAL LIGHT HANDLE (MISCELLANEOUS) ×3 IMPLANT
DRAPE C-ARM 42X120 X-RAY (DRAPES) ×3 IMPLANT
DRAPE CAMERA CLOSED 9X96 (DRAPES) ×3 IMPLANT
DRAPE LINGEMAN PERC (DRAPES) ×3 IMPLANT
DRAPE SURG IRRIG POUCH 19X23 (DRAPES) IMPLANT
DRSG PAD ABDOMINAL 8X10 ST (GAUZE/BANDAGES/DRESSINGS) IMPLANT
DRSG TEGADERM 8X12 (GAUZE/BANDAGES/DRESSINGS) ×6 IMPLANT
EXTRACTOR STONE NITINOL NGAGE (UROLOGICAL SUPPLIES) ×3 IMPLANT
FIBER LASER FLEXIVA 1000 (UROLOGICAL SUPPLIES) IMPLANT
FIBER LASER FLEXIVA 200 (UROLOGICAL SUPPLIES) IMPLANT
FIBER LASER FLEXIVA 365 (UROLOGICAL SUPPLIES) IMPLANT
FIBER LASER FLEXIVA 550 (UROLOGICAL SUPPLIES) IMPLANT
FIBER LASER TRAC TIP (UROLOGICAL SUPPLIES) IMPLANT
GAUZE SPONGE 4X4 12PLY STRL (GAUZE/BANDAGES/DRESSINGS) ×3 IMPLANT
GLOVE SURG SS PI 8.0 STRL IVOR (GLOVE) IMPLANT
GOWN STRL REUS W/TWL XL LVL3 (GOWN DISPOSABLE) ×3 IMPLANT
KIT BASIN OR (CUSTOM PROCEDURE TRAY) ×3 IMPLANT
MANIFOLD NEPTUNE II (INSTRUMENTS) ×3 IMPLANT
NS IRRIG 1000ML POUR BTL (IV SOLUTION) ×3 IMPLANT
PACK BASIC VI WITH GOWN DISP (CUSTOM PROCEDURE TRAY) ×3 IMPLANT
PAD ABD 8X10 STRL (GAUZE/BANDAGES/DRESSINGS) ×6 IMPLANT
PROBE LITHOCLAST ULTRA 3.8X403 (UROLOGICAL SUPPLIES) IMPLANT
PROBE PNEUMATIC 1.0MMX570MM (UROLOGICAL SUPPLIES) IMPLANT
SET IRRIG Y TYPE TUR BLADDER L (SET/KITS/TRAYS/PACK) ×3 IMPLANT
SET WARMING FLUID IRRIGATION (MISCELLANEOUS) IMPLANT
SPONGE LAP 4X18 X RAY DECT (DISPOSABLE) ×3 IMPLANT
STONE CATCHER W/TUBE ADAPTER (UROLOGICAL SUPPLIES) IMPLANT
SUT SILK 2 0 30  PSL (SUTURE)
SUT SILK 2 0 30 PSL (SUTURE) IMPLANT
SYR 20CC LL (SYRINGE) ×6 IMPLANT
SYRINGE 10CC LL (SYRINGE) ×3 IMPLANT
TOWEL OR NON WOVEN STRL DISP B (DISPOSABLE) ×3 IMPLANT
TRAY FOLEY CATH 14FRSI W/METER (CATHETERS) IMPLANT
TUBING CONNECTING 10 (TUBING) ×4 IMPLANT
TUBING CONNECTING 10' (TUBING) ×2

## 2014-08-18 NOTE — Anesthesia Postprocedure Evaluation (Signed)
  Anesthesia Post-op Note  [name]  Procedure(s) Performed: Procedure(s) (LRB): LEFT NEPHROLITHOTOMY PERCUTANEOUS SECOND LOOK (Left)  Patient Location: PACU  Anesthesia Type: General  Level of Consciousness: awake and alert   Airway and Oxygen Therapy: Patient Spontanous Breathing  Post-op Pain: mild  Post-op Assessment: Post-op Vital signs reviewed, Patient's Cardiovascular Status Stable, Respiratory Function Stable, Patent Airway and No signs of Nausea or vomiting  Last Vitals:  Filed Vitals:   08/18/14 1613  BP: 152/72  Pulse: 77  Temp: 36.8 C  Resp: 12    Post-op Vital Signs: stable   Complications: No apparent anesthesia complications

## 2014-08-18 NOTE — Anesthesia Procedure Notes (Signed)
Procedure Name: Intubation Date/Time: 08/18/2014 1:54 PM Performed by: Johnathan Hausen A Pre-anesthesia Checklist: Patient identified, Emergency Drugs available, Suction available, Patient being monitored and Timeout performed Patient Re-evaluated:Patient Re-evaluated prior to inductionOxygen Delivery Method: Circle system utilized Preoxygenation: Pre-oxygenation with 100% oxygen Intubation Type: Combination inhalational/ intravenous induction Ventilation: Mask ventilation without difficulty Laryngoscope Size: Mac and 4 Grade View: Grade I Tube type: Oral Tube size: 8.0 mm Number of attempts: 1 Airway Equipment and Method: Stylet Placement Confirmation: ETT inserted through vocal cords under direct vision,  positive ETCO2 and breath sounds checked- equal and bilateral Secured at: 23 cm Tube secured with: Tape Dental Injury: Teeth and Oropharynx as per pre-operative assessment

## 2014-08-18 NOTE — Anesthesia Preprocedure Evaluation (Signed)
Anesthesia Evaluation  Patient identified by MRN, date of birth, ID band Patient awake    Reviewed: Allergy & Precautions, H&P , NPO status , Patient's Chart, lab work & pertinent test results  Airway Mallampati: III  TM Distance: <3 FB Neck ROM: Full    Dental no notable dental hx. (+) Teeth Intact, Dental Advisory Given   Pulmonary sleep apnea ,  Stop bang 7 breath sounds clear to auscultation  Pulmonary exam normal + decreased breath sounds      Cardiovascular hypertension, Pt. on medications Rhythm:Regular Rate:Normal  Raw Data Images: Mild diaphragmatic attenuation. Normal left  ventricular size. Stress Images: Normal homogeneous uptake in all areas of the  myocardium. Rest Images: Normal homogeneous uptake in all areas of the  myocardium. Subtraction (SDS): No evidence of ischemia. LV Wall Motion: NL LV Function; NL Wall Motion or EF 58%  Impression Exercise Capacity: Lexiscan with no exercise. BP Response: Normal blood pressure response. Clinical Symptoms: No significant symptoms noted. ECG Impression: No significant ECG changes with Lexiscan. Comparison with Prior Nuclear Study: No previous nuclear study  performed   Overall Impression: Low risk stress nuclear study with mild  diaphragmatic attenuation artifact.    Neuro/Psych negative neurological ROS  negative psych ROS   GI/Hepatic negative GI ROS, Neg liver ROS, GERD-  Medicated and Controlled,  Endo/Other  diabetes, Well Controlled, Type 2, Insulin Dependent, Oral Hypoglycemic AgentsMorbid obesity  Renal/GU Renal InsufficiencyRenal disease  negative genitourinary   Musculoskeletal negative musculoskeletal ROS (+)   Abdominal (+) + obese,   Peds negative pediatric ROS (+)  Hematology negative hematology ROS (+)   Anesthesia Other Findings   Reproductive/Obstetrics negative OB ROS                              Anesthesia Physical Anesthesia Plan  ASA: III  Anesthesia Plan: General   Post-op Pain Management:    Induction: Intravenous  Airway Management Planned: Oral ETT  Additional Equipment:   Intra-op Plan:   Post-operative Plan: Extubation in OR  Informed Consent:   Plan Discussed with: Surgeon  Anesthesia Plan Comments:         Anesthesia Quick Evaluation

## 2014-08-18 NOTE — Transfer of Care (Signed)
Immediate Anesthesia Transfer of Care Note  [name]  Procedure(s) Performed: Procedure(s): LEFT NEPHROLITHOTOMY PERCUTANEOUS SECOND LOOK (Left)  Patient Location: PACU  Anesthesia Type:General  Level of Consciousness: awake, sedated and patient cooperative  Airway & Oxygen Therapy: Patient Spontanous Breathing and Patient connected to face mask oxygen  Post-op Assessment: Report given to PACU RN and Post -op Vital signs reviewed and stable  Post vital signs: Reviewed and stable  Complications: No apparent anesthesia complications

## 2014-08-18 NOTE — Discharge Instructions (Signed)
Percutaneous Nephrolithotomy °Kidney stones can cause a great deal of pain. They can block urine from leaving the body. And they can lead to infection. Kidney stones might pass on their own, or they can be broken up by shock waves from a special machine. But sometimes surgery is needed to get rid of kidney stones. One type of surgery is called percutaneous nephrolithotomy. "Nephro" means kidney, "litho" means stone and "tomy" means removal by surgery. "Percutaneous" means through the skin. This type of surgery needs only a small cut (incision) in the skin. °This surgery may be suggested for various reasons. They include: °· The kidney stones are 2 centimeters wide (about 3/4 inch) or bigger. They also might be oddly shaped. °· Other treatments were tried, but all or some of the kidney stones remain. °· Infection has developed. °· No other treatment can be used. °LET YOUR CAREGIVER KNOW ABOUT:  °· Any allergies. °· All medications you are taking, including: °¨ Herbs, eyedrops, over-the-counter medications and creams. °¨ Blood thinners (anticoagulants), aspirin or other drugs that could affect blood clotting. °· Use of steroids (by mouth or as creams). °· Previous problems with anesthetics, including local anesthetics. °· Possibility of pregnancy, if this applies. °· Any history of blood clots. °· Any history of bleeding or other blood problems. °· Previous surgery. °· Smoking history. °· Other health problems. °RISKS AND COMPLICATIONS  °· During surgery: °¨ Sometimes all the kidney stones cannot be removed through the tube. Then, the percutaneous nephrolithotomy procedure would be stopped. Open surgery would be used to remove the remaining stones. °· Short-term risks from the surgery could include: °¨ Excessive bleeding. °¨ Blood in your urine. °¨ Holes in the kidney. These usually heal on their own. °¨ Pain. °¨ Redness or tenderness at the incision site. °¨ Numbness (loss of feeling) in the area treated. Tingling  also is possible. °¨ A pooling of blood in the wound (hematoma). °¨ Infection. °¨ Slow healing. °· Longer-term possibilities include: °¨ Kidney damage. °¨ Damage to organs near the kidney. °¨ Need for a repeat surgery. °BEFORE THE PROCEDURE °· You may need to take some tests before your surgery. These might include: °¨ Blood tests. °¨ Urine tests. °¨ Tests to make sure your heart is working properly. °· Let your healthcare provider know if you think you might have a urinary tract infection. You will probably need to take an antibiotic to treat this before the surgery. °· Two weeks before your surgery, stop using aspirin and non-steroidal anti-inflammatory drugs (NSAIDs) for pain relief. This includes prescription drugs and over-the-counter drugs such as ibuprofen and naproxen. Also stop taking vitamin E. °· If you take blood-thinners, ask your healthcare provider when you should stop taking them. °· Do not eat or drink for about 8 hours before your surgery. °· You might be asked to shower or wash with a special antibacterial soap before the procedure. °· Arrive at least an hour before the surgery, or whenever your surgeon recommends. This will give you time to check in and fill out any needed paperwork. °PROCEDURE °· The preparation: °¨ You will change into a hospital gown. °¨ You will be given an IV. A needle will be inserted in your arm. Medication will be able to flow directly into your body through this needle. °¨ You might be given a sedative. This medication will help you relax. °¨ You may be given a general anesthetic (a drug that will put you to sleep during the surgery). Or, you may   get a local anesthetic (part of your body will be numb, but you will remain awake).  A catheter (tube) will be put in your bladder to drain urine during and after surgery.  The procedure:  The surgeon will make a small incision in your lower back.  A tube will be inserted through the incision into your kidney.  Each  kidney stone is removed through this tube. Larger stones may first be broken up with a laser (a high-intensity light beam) or other tools.  If a kidney stone has already left the kidney, the surgeon would use a special tool to bring it back in. Then it would be removed through the tube.  After all the stones are taken out, a catheter will be put in. Fluid can build up around the kidney as it heals. The catheter lets this fluid drain out of the body.  A dressing (medicine and bandage) will be put on the incision area.  The surgery usually takes three to four hours. AFTER THE PROCEDURE  You will stay in a recovery area until the anesthesia has worn off. Your blood pressure and pulse will be checked often. You might be given more pain medication.  You will be moved to a hospital room for the rest of your stay.  The catheter that is taking urine out of your body will be taken out within 24 hours.  The day after your surgery, you should be able to walk around. Walking helps prevent blood clots (thick clumps that can block the flow of blood).  You may be asked to do some breathing exercises.  You will be able to have only liquids for a day or two.  Before you go home:  The catheter that is draining fluid from the kidney area is usually taken out.  You will be taught how to care for the incision. Be sure to ask how often the dressing should be changed and when it can get wet.  You also will be told what you should and should not do while your kidney and incisions heal. For instance, you may be urged to walk to prevent blood clots. Document Released: 05/19/2009 Document Revised: 10/14/2011 Document Reviewed: 05/19/2009 [facility] Patient Information 2015 [place], [place]. This information is not intended to replace advice given to you by your health care provider. Make sure you discuss any questions you have with your health care provider.

## 2014-08-18 NOTE — Brief Op Note (Signed)
08/16/2014 - 08/18/2014  2:45 PM  PATIENT:  Oscar Rose  54 y.o. male  PRE-OPERATIVE DIAGNOSIS:  LEFT RENAL STONES  POST-OPERATIVE DIAGNOSIS:  LEFT RENAL STONES  PROCEDURE:  Procedure(s): LEFT NEPHROLITHOTOMY PERCUTANEOUS SECOND LOOK (Left) <2cm  SURGEON:  Surgeon(s) and Role:    * Malka So, MD - Primary  PHYSICIAN ASSISTANT:   ASSISTANTS: none   ANESTHESIA:   general  EBL:  Total I/O In: 1000 [I.V.:1000] Out: 1200 [Urine:1200]  BLOOD ADMINISTERED:none  DRAINS: Urinary Catheter (Foley)   LOCAL MEDICATIONS USED:  NONE  SPECIMEN:  Source of Specimen:  stone fragments  DISPOSITION OF SPECIMEN:  to patient  COUNTS:  YES  TOURNIQUET:  * No tourniquets in log *  DICTATION: .Other Dictation: Dictation Number [id number]  PLAN OF CARE: Admit to inpatient   PATIENT DISPOSITION:  PACU - hemodynamically stable.   Delay start of Pharmacological VTE agent (>24hrs) due to surgical blood loss or risk of bleeding: yes

## 2014-08-18 NOTE — H&P (View-Only) (Signed)
Patient ID: VEGA TZINTZUN, male   DOB: 01/28/1961, 54 y.o.   MRN: [id number] 1 Day Post-Op  Subjective: Erik is s/p left PCNL for an encrusted stent.   He has no major pain complaints but has had nausea and vomiting through the night.   The post op CT is pending.  His hgb is stable. ROS:  Review of Systems  Constitutional: Negative for fever.  Gastrointestinal: Positive for nausea and vomiting.    Anti-infectives: Anti-infectives    Start     Dose/Rate Route Frequency Ordered Stop   08/16/14 2200  ciprofloxacin (CIPRO) tablet 500 mg     500 mg Oral 2 times daily 08/16/14 1558     08/16/14 0800  ceFAZolin (ANCEF) 3 g in dextrose 5 % 50 mL IVPB  Status:  Discontinued     3 g160 mL/hr over 30 Minutes Intravenous  Once 08/16/14 0752 08/16/14 1151   08/16/14 0753  ciprofloxacin (CIPRO) IVPB 400 mg     400 mg200 mL/hr over 60 Minutes Intravenous 60 min pre-op 08/16/14 0753 08/16/14 1056      Current Facility-Administered Medications  Medication Dose Route Frequency Provider Last Rate Last Dose  . 0.45 % NaCl with KCl 20 mEq / L infusion   Intravenous Continuous Malka So, MD 125 mL/hr at 08/17/14 0055    . acetaminophen (TYLENOL) tablet 650 mg  650 mg Oral Q4H PRN Malka So, MD      . bisacodyl (DULCOLAX) suppository 10 mg  10 mg Rectal Daily PRN Malka So, MD      . ciprofloxacin (CIPRO) tablet 500 mg  500 mg Oral BID Malka So, MD   500 mg at 08/16/14 2055  . diphenhydrAMINE (BENADRYL) injection 12.5 mg  12.5 mg Intravenous Q6H PRN Malka So, MD       Or  . diphenhydrAMINE (BENADRYL) 12.5 MG/5ML elixir 12.5 mg  12.5 mg Oral Q6H PRN Malka So, MD      . docusate sodium (COLACE) capsule 100 mg  100 mg Oral BID Malka So, MD   100 mg at 08/16/14 2055  . fenofibrate tablet 54 mg  54 mg Oral Daily Malka So, MD   54 mg at 08/16/14 1747  . furosemide (LASIX) tablet 20 mg  20 mg Oral q morning - 10a Malka So, MD      . glipiZIDE (GLUCOTROL XL) 24 hr tablet 10 mg  10  mg Oral Q breakfast Malka So, MD      . HYDROmorphone (DILAUDID) injection 0.5-1 mg  0.5-1 mg Intravenous Q2H PRN Malka So, MD      . insulin aspart (novoLOG) injection 0-20 Units  0-20 Units Subcutaneous TID WC Malka So, MD   3 Units at 08/16/14 1747  . insulin glargine (LANTUS) injection 25 Units  25 Units Subcutaneous QHS Malka So, MD   25 Units at 08/16/14 2249  . lisinopril (PRINIVIL,ZESTRIL) tablet 20 mg  20 mg Oral q morning - 10a Malka So, MD      . ondansetron [facility]) injection 4 mg  4 mg Intravenous Q4H PRN Malka So, MD   4 mg at 08/17/14 0136  . oxybutynin (DITROPAN) tablet 5 mg  5 mg Oral Q8H PRN Malka So, MD   5 mg at 08/16/14 1747  . oxyCODONE-acetaminophen (PERCOCET/ROXICET) 5-325 MG per tablet 1-2 tablet  1-2 tablet Oral Q4H PRN Malka So, MD      .  pantoprazole (PROTONIX) EC tablet 40 mg  40 mg Oral Daily Malka So, MD   40 mg at 08/16/14 1823  . rosuvastatin (CRESTOR) tablet 20 mg  20 mg Oral QPM Malka So, MD   20 mg at 08/16/14 1747  . zolpidem (AMBIEN) tablet 5 mg  5 mg Oral QHS PRN,MR X 1 Malka So, MD         Objective: Vital signs in last 24 hours: Temp:  [97.6 F (36.4 C)-98.4 F (36.9 C)] 97.7 F (36.5 C) (01/13 [id number]) Pulse Rate:  [80-111] 111 (01/13 0608) Resp:  [8-22] 16 (01/13 0608) BP: (137-177)/(66-99) 160/74 mmHg (01/13 0608) SpO2:  [94 %-100 %] 99 % (01/13 0608) Weight:  [142.996 kg (315 lb 4 oz)] 142.996 kg (315 lb 4 oz) (01/12 0806)  Intake/Output from previous day: 01/12 0701 - 01/13 0700 In: 3125 [I.V.:3125] Out: 2900 [Urine:2900] Intake/Output this shift: Total I/O In: 502.1 [I.V.:502.1] Out: 2200 [Urine:2200]   Physical Exam  Constitutional: He is well-developed, well-nourished, and in no distress.  Cardiovascular: Normal rate and regular rhythm.   Pulmonary/Chest: Effort normal and breath sounds normal.  Abdominal:  NT is draining pink urine.   Genitourinary:  Foley is draining clear urine.    Vitals reviewed.   Lab Results:   Recent Labs  08/16/14 0820 08/16/14 1629 08/17/14 0353  WBC 5.7  --   --   HGB 12.9* 12.1* 12.7*  HCT 38.7* 36.4* 37.8*  PLT 237  --   --    BMET  Recent Labs  08/16/14 1629 08/17/14 0353  NA 135 134*  K 4.4 4.2  CL 102 99  CO2 24 26  GLUCOSE 161* 187*  BUN 29* 27*  CREATININE 1.52* 1.48*  CALCIUM 8.8 8.7   PT/INR  Recent Labs  08/16/14 0820  LABPROT 12.9  INR 0.96   ABG No results for input(s): PHART, HCO3 in the last 72 hours.  Invalid input(s): PCO2, PO2  Studies/Results: Dg Chest 2 View  08/16/2014   CLINICAL DATA:  Preop for percutaneous nephrolithotomy  EXAM: CHEST  2 VIEW  COMPARISON:  None.  FINDINGS: The heart size and mediastinal contours are within normal limits. Both lungs are clear. The visualized skeletal structures are unremarkable.  IMPRESSION: No active cardiopulmonary disease.   Electronically Signed   By: Kathreen Devoid   On: 08/16/2014 09:50   Dg Abd 1 View  08/16/2014   CLINICAL DATA:  Left percutaneous nephrolithotomy.  EXAM: ABDOMEN - 1 VIEW  COMPARISON:  07/07/2014.  FINDINGS: Left ureteral stent and stent fragment noted. Guidewire noted. Left nephrolithiasis. Percutaneous renal sheath noted. Contrast in the collecting system.  IMPRESSION: Intraoperative findings as above.   Electronically Signed   By: Marcello Moores  Register   On: 08/16/2014 14:37   Dg C-arm 61-120 Min-no Report  08/16/2014   CLINICAL DATA: intra op   C-ARM 61-120 MINUTES  Fluoroscopy was utilized by the requesting physician.  No radiographic  interpretation.    Ir Ureteral Stent Left New Access W/o Sep Nephrostomy Cath  08/16/2014   CLINICAL DATA:  Left ureteral stent fragment.  Left nephrolithiasis.  EXAM: IR URETURAL STENT LEFT NEW ACCESS W/O SEP NEPHROSTOMY CATH  FLUOROSCOPY TIME:  1 min and 24 seconds.  MEDICATIONS AND MEDICAL HISTORY: Versed 1 mg, Fentanyl 50 mcg.  Additional Medications: Ciprofloxacin 400 mg.  ANESTHESIA/SEDATION:  Moderate sedation time: 15 minutes  CONTRAST:  10 cc Omnipaque 300  PROCEDURE: The procedure, risks, benefits, and alternatives were explained to  the patient. Questions regarding the procedure were encouraged and answered. The patient understands and consents to the procedure.  The back was prepped with Betadine in a sterile fashion, and a sterile drape was applied covering the operative field. A sterile gown and sterile gloves were used for the procedure.  1% lidocaine was utilized for local infiltration. Under sonographic guidance, a 22 gauge Chiba needle was inserted into a posterior lower pole calyx. Contrast was injected. It was removed over a 018 wire which was up sized to a 3 J. A Kumpe catheter was advanced over the 3 J wire into the bladder. It was injected with contrast. It was then sewn to the skin.  FINDINGS: Images demonstrate access into the left kidney via posterior lower pole talus. The final image demonstrates a 5 French left nephro ureteral catheter with its tip in the bladder.  COMPLICATIONS: None  IMPRESSION: Successful left nephro ureteral catheter placement in preparation for percutaneous lithotomy.   Electronically Signed   By: Maryclare Bean M.D.   On: 08/16/2014 11:16     Assessment: s/p Procedure(s): LEFT NEPHROLITHOTOMY PERCUTANEOUS  He has had nausea and vomiting through the night but no significant pain.  His Hgb is stable but the post op CT is pending.  Plan: CT today to assess for residual fragments.   If the CT is clear, I will pull the tube and if not I will consider a second look. I am going to start reglan.   The CT shows small LLP fragments with the largest about 4x41mm.   I am going to see about scheduling a second look.    LOS: 1 day    Lawsen Arnott J 08/17/2014

## 2014-08-18 NOTE — Progress Notes (Signed)
UR completed 

## 2014-08-18 NOTE — Interval H&P Note (Signed)
History and Physical Interval Note:  He is for 2nd look PCNL today for residual lower pole fragments.   08/18/2014 1:38 PM  Oscar Rose  has presented today for surgery, with the diagnosis of LEFT RENAL STONES  The various methods of treatment have been discussed with the patient and family. After consideration of risks, benefits and other options for treatment, the patient has consented to  Procedure(s): LEFT NEPHROLITHOTOMY PERCUTANEOUS SECOND LOOK (Left) HOLMIUM LASER APPLICATION (Left) as a surgical intervention .  The patient's history has been reviewed, patient examined, no change in status, stable for surgery.  I have reviewed the patient's chart and labs.  Questions were answered to the patient's satisfaction.     Bernon Arviso J

## 2014-08-19 ENCOUNTER — Encounter (HOSPITAL_COMMUNITY): Payer: Self-pay | Admitting: Urology

## 2014-08-19 DIAGNOSIS — N200 Calculus of kidney: Secondary | ICD-10-CM | POA: Diagnosis not present

## 2014-08-19 LAB — GLUCOSE, CAPILLARY
Glucose-Capillary: 133 mg/dL — ABNORMAL HIGH (ref 70–99)
Glucose-Capillary: 125 mg/dL — ABNORMAL HIGH (ref 70–99)

## 2014-08-19 NOTE — Op Note (Signed)
NAME:  UNDREA, CHAFFEY                ACCOUNT NO.:  [id number]  MEDICAL RECORD NO.:  [id number]  LOCATION:                                 FACILITY:  PHYSICIAN:  Marshall Cork. Jeffie Pollock, M.D.    DATE OF BIRTH:  03/18/1961  DATE OF PROCEDURE:  08/18/2014 DATE OF DISCHARGE:                              OPERATIVE REPORT   PROCEDURE:  Second-look percutaneous nephrolithotomy for up to 2 cm stone.  PREOPERATIVE DIAGNOSIS:  Left renal stone fragments.  POSTOPERATIVE DIAGNOSIS:  Left renal stone fragments.  SURGEON:  Marshall Cork. Jeffie Pollock, MD  ANESTHESIA:  General.  SPECIMENS:  Stone fragments.  DRAINS:  Foley catheter.  BLOOD LOSS:  None.  COMPLICATIONS:  None.  INDICATIONS:  Mr. 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  He has previously undergone cystoscopy with cystolitholapaxy for the encrusted distal loop and then ureteroscopy with removal of the lower 2/3rd of the stent.  He had lithotripsy prior to that, but the stone did not fragment adequately to remove with ureteroscopic laser lithotripsy.  He had then undergone a first look percutaneous nephrolithotomy on Tuesday this week and the bulk of the stone and the retained stents were removed, however CT scan on postop day 1, revealed significant residual stone fragments in the lower pole that were felt to require a second-look procedure.  FINDINGS OF PROCEDURE:  He was taken to the operating room where general anesthetic was induced.  He was given 4 mg of Cipro.  He was fitted with PAS hose.  His Foley catheter was in from his prior procedure.  General anesthetic was induced on the holding room stretcher.  He was then rolled supine on chest rolls with great care taken to pad all pressure points.  His Ainsworth nephrostomy tube was removed, but the safety catheter was left in place.  The nephrostomy site was prepped with Betadine solution and he was draped in usual sterile  fashion.  A 16-French flexible nephroscope was then passed through the tract alongside the safety catheter.  A guidewire had been placed through the safety catheter to aid rigidity.  Once in the collecting system, inspection revealed residual stone fragments in the lower pole as noted on CT scan.  The largest was approximately 4 x 6 mm per CT measurement and upon inspection an NGage basket was used to remove this and a few of the other fragments.  The remaining fragments were flushed into the renal pelvis and removed with several passes of an NCompass basket. Once all significant fragments were removed, inspection revealed only some 1 mm smaller fragments remaining in the collecting system.  There were a few small 1 mm fragments adherent to the tract that I did not feel were in need of removal.  Once I felt the collecting system was adequately free of stones and I did inspect the upper pole as well as the mid lower pole calyces, the scope was removed and the safety catheter and wire were removed under fluoroscopic guidance.  The drapes were removed and a dressing was applied to the nephrostomy site.  The Foley catheter will be left  in overnight.  The patient was then rolled back supine on the recovery room stretcher.  His anesthetic was reversed.  He was moved to recovery room in stable condition.  There were no complications.     Marshall Cork. Jeffie Pollock, M.D.     JJW/MEDQ  D:  08/18/2014  T:  08/18/2014  Job:  [id number]

## 2014-08-19 NOTE — Progress Notes (Signed)
UR completed 

## 2014-08-19 NOTE — Discharge Summary (Signed)
  Date of admission: 08/16/2014  Date of discharge: 08/19/2014  Admission diagnosis: nephrolithiasis  Discharge diagnosis: same  Secondary diagnoses: none  History and Physical: For full details, please see admission history and physical. Briefly, Oscar Rose is a 54 y.o. year old patient with large L nephrolithiasis. After discussion of potential therapies, he elected for PCNL.   Hospital Course: He underwent PCNL. Postoperative CT scan showed a few fragments in the lower pole. He was taken back for second look on 1/14, and all stones were removed. Foley and neph tube were removed and he urinated and drained well without issues.  On 1/15, his pain was well controlled without pain meds, and he was discharged home.  Laboratory values:  Recent Labs  08/16/14 0820 08/16/14 1629 08/17/14 0353  HGB 12.9* 12.1* 12.7*  HCT 38.7* 36.4* 37.8*    Recent Labs  08/16/14 1629 08/17/14 0353  CREATININE 1.52* 1.48*    Disposition: Home  Discharge instruction: The patient was instructed to be ambulatory but told to refrain from heavy lifting, strenuous activity, or driving.  Discharge medications:    Medication List    TAKE these medications        acetaminophen 500 MG tablet  Commonly known as:  TYLENOL  Take 1,000 mg by mouth every 6 (six) hours as needed for mild pain or moderate pain.     fenofibrate 145 MG tablet  Commonly known as:  TRICOR  Take 145 mg by mouth every morning.     furosemide 20 MG tablet  Commonly known as:  LASIX  Take 20 mg by mouth every morning.     glipiZIDE 10 MG 24 hr tablet  Commonly known as:  GLUCOTROL XL  Take 10 mg by mouth daily with breakfast.     insulin glargine 100 UNIT/ML injection  Commonly known as:  LANTUS  Inject 55 Units into the skin at bedtime.     lisinopril 20 MG tablet  Commonly known as:  PRINIVIL,ZESTRIL  Take 20 mg by mouth every morning.     omeprazole 40 MG capsule  Commonly known as:  PRILOSEC  Take 40 mg by  mouth every morning.     oxyCODONE-acetaminophen 5-325 MG per tablet  Commonly known as:  ROXICET  Take 1 tablet by mouth every 6 (six) hours as needed for severe pain.     phenazopyridine 200 MG tablet  Commonly known as:  PYRIDIUM  Take 1 tablet (200 mg total) by mouth 3 (three) times daily as needed for pain.     rosuvastatin 20 MG tablet  Commonly known as:  CRESTOR  Take 20 mg by mouth every evening.        Followup:      Follow-up Information    Follow up with Malka So, MD.   Specialty:  Urology   Why:  If not already scheduled, make an appt for 2-3 weeks.    Contact information:   [address] [place] [id number] [phone]

## 2014-11-02 ENCOUNTER — Encounter: Payer: Self-pay | Admitting: Cardiology

## 2014-11-02 ENCOUNTER — Ambulatory Visit (INDEPENDENT_AMBULATORY_CARE_PROVIDER_SITE_OTHER): Payer: Medicaid Other | Admitting: Cardiology

## 2014-11-02 VITALS — BP 142/82 | HR 90 | Ht 70.5 in | Wt 320.0 lb

## 2014-11-02 DIAGNOSIS — R072 Precordial pain: Secondary | ICD-10-CM

## 2014-11-02 NOTE — Patient Instructions (Signed)
The current medical regimen is effective;  continue present plan and medications.  Follow up in 1 month with Dr Percival Spanish in [place].  Thank you for choosing [facility]!!

## 2014-11-02 NOTE — Progress Notes (Signed)
HPI The patient presents for follow up of chest discomfort. He has significant cardiovascular risk factors. He has poorly controlled diabetes. After the initial visit last time I sent him for a stress perfusion study which demonstrated a preserved ejection fraction and diaphragmatic attenuation but no evidence of ischemia.  He is seen today because on Sunday he had some chest discomfort. This was unlike his previous reflux. He was sitting after dinner and he developed some discomfort that he describes over his entire stomach and up into his chest. It was a pressure discomfort. He did do some belching. He refused to take a nitroglycerin. His blood pressure was elevated at that time. He was mildly short of breath and his wife said pale. However, it eventually went away. Since that time he's had no further discomfort. He has not had symptoms like this before. He was not having discomfort into his jaw or down his arms. He otherwise has had no acute symptoms. He did see his primary provider.  No Known Allergies  Current Outpatient Prescriptions  Medication Sig Dispense Refill  . acetaminophen (TYLENOL) 500 MG tablet Take 1,000 mg by mouth every 6 (six) hours as needed for mild pain or moderate pain.    . fenofibrate (TRICOR) 145 MG tablet Take 145 mg by mouth every morning.     . furosemide (LASIX) 20 MG tablet Take 20 mg by mouth every morning.    Marland Kitchen glipiZIDE (GLUCOTROL XL) 10 MG 24 hr tablet Take 10 mg by mouth daily with breakfast.    . insulin glargine (LANTUS) 100 UNIT/ML injection Inject 55 Units into the skin at bedtime.     Marland Kitchen lisinopril (PRINIVIL,ZESTRIL) 20 MG tablet Take 20 mg by mouth every morning.    Marland Kitchen omeprazole (PRILOSEC) 40 MG capsule Take 40 mg by mouth every morning.     . pravastatin (PRAVACHOL) 40 MG tablet Take 40 mg by mouth.     No current facility-administered medications for this visit.    Past Medical History  Diagnosis Date  . Hyperlipidemia   . Diabetic neuropathy     . GERD (gastroesophageal reflux disease)   . Arthritis   . Hypertension   . Depression        . Type 2 diabetes mellitus   . History of kidney stones   . Retained ureteral stent     w/ encrustation SINCE 2012  . 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 History of retinal detachment   . Rotator cuff syndrome of right shoulder   . Restless leg syndrome   . At risk for sleep apnea     STOP-BANG= 7    SENT TO PCP 06-29-2014  . CKD (chronic kidney disease), stage II     montitored by nephrologist    Past Surgical History  Procedure Laterality Date  . 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 Cardiovascular stress test  04-05-2014  dr croitoru    low risk lexiscan nuclear study with mild diaphragmatic attenuation artifact/  no ischemia/  ef 58%  . Tonsillectomy and adenoidectomy  as child  . Cataract extraction w/ intraocular lens  implant, bilateral  2013  . I & d  infected spider bite upper back  06/ 2012  . Retinal detachment surgery Left 2013  . Cysto /  left ureteral stent placement  2012  . Cystoscopy w/ ureteral stent  removal Left 07/07/2014    Procedure: CYSTO WITH LEFT PORTION STENT REMOVAL;  Surgeon: Malka So, MD;  [facility];  Service: Urology;  Laterality: Left;  . Cystoscopy with ureteroscopy and stent placement Left 07/07/2014    Procedure: CYSTOLITHALOPAXY URETEROSCOPY WITH STENT;  Surgeon: Malka So, MD;  Location: [facility];  Service: Urology;  Laterality: Left;  . Holmium laser application N/A [id number]    Procedure: HOLMIUM LASER APPLICATION;  Surgeon: Malka So, MD;  Location[facility];  Service: Urology;  Laterality: N/A;  . Nephrolithotomy Left 08/16/2014    Procedure: LEFT NEPHROLITHOTOMY PERCUTANEOUS;  Surgeon: Malka So, MD;  Location: [facility];  Service: Urology;  Laterality: Left;  . Nephrolithotomy  Left 08/18/2014    Procedure: LEFT NEPHROLITHOTOMY PERCUTANEOUS SECOND LOOK;  Surgeon: Malka So, MD;  Location: [facility];  Service: Urology;  Laterality: Left;    ROS:  Positive for cramping, pain, the, headaches, dizziness, tinnitus, nausea, constipation, reflux. Otherwise as stated in the HPI and negative for all other systems.  PHYSICAL EXAM BP 142/82 mmHg  Pulse 90  Ht 5' 10.5" (1.791 m)  Wt 320 lb (145.151 kg)  BMI 45.25 kg/m2 GENERAL:  Well appearing NECK:  No jugular venous distention, waveform within normal limits, carotid upstroke brisk and symmetric, no bruits, no thyromegaly LYMPHATICS:  No cervical, inguinal adenopathy LUNGS:  Clear to auscultation bilaterally BACK:  No CVA tenderness CHEST:  Unremarkable HEART:  PMI not displaced or sustained,S1 and S2 within normal limits, no S3, no S4, no clicks, no rubs, no murmurs ABD:  Flat, positive bowel sounds normal in frequency in pitch, no bruits, no rebound, no guarding, no midline pulsatile mass, no hepatomegaly, no splenomegaly,obese EXT:  2 plus pulses throughout, mild edema, no cyanosis no clubbing   EKG:  Sinus rhythm, right axis deviation, rate 90, intervals within normal limits, no acute ST-T wave changes.  11/02/2014   ASSESSMENT AND PLAN  CHEST PAIN:  The pain is atypical and a one time event.  We had a long discussion about this. Given the fact that he's not having ongoing symptoms I am not planning further testing today. However, he's then go on some walking and other activities and if he has any symptoms at all levels reconsider imaging given his risk factors.  SNORING:  His wife describes apnea but he has refused sleep testing.   HTN:  His blood pressure is well controlled typically. He'll continue the meds as listed.  DM:  His most recent A1c increase to 7.9. This is being followed by Sherrie Mustache, MD   DYSLIPIDEMIA:  His LDL was 86. His triglycerides were increased 13 days ago. He will be working on  his diet.  Outside records reviewed.

## 2014-12-14 ENCOUNTER — Ambulatory Visit (INDEPENDENT_AMBULATORY_CARE_PROVIDER_SITE_OTHER): Payer: Medicaid Other | Admitting: Cardiology

## 2014-12-14 ENCOUNTER — Encounter: Payer: Self-pay | Admitting: Cardiology

## 2014-12-14 VITALS — BP 136/80 | HR 88 | Ht 70.5 in | Wt 321.0 lb

## 2014-12-14 DIAGNOSIS — R072 Precordial pain: Secondary | ICD-10-CM | POA: Diagnosis not present

## 2014-12-14 NOTE — Patient Instructions (Signed)
Your physician recommends that you continue on your current medications as directed. Please refer to the Current Medication list given to you today.  Follow up in 6 months with Dr. Percival Spanish in [place].  You will receive a letter in the mail 2 months before you are due.  Please call us when you receive this letter to schedule your follow up appointment.  Thank you for choosing [facility]!!

## 2014-12-14 NOTE — Progress Notes (Signed)
HPI The patient presents for follow up of chest discomfort.  He has complained about this previous visits. In September of last year II sent him for a stress perfusion study which demonstrated a preserved ejection fraction and diaphragmatic attenuation but no evidence of ischemia.   Since I saw him recently he has had none of the severe discomfort that he was describing at that visit. He has some occasional discomfort that he ascribes to reflux.  He's had a little today but he did not take his omeprazole.  He denies any reproducible symptoms however. He's had some mild chest congestion. He's very limited by joint and back pains. Because of decreased activity he's continued to gain weight.  No Known Allergies  Current Outpatient Prescriptions  Medication Sig Dispense Refill  . acetaminophen (TYLENOL) 500 MG tablet Take 1,000 mg by mouth every 6 (six) hours as needed for mild pain or moderate pain.    . fenofibrate (TRICOR) 145 MG tablet Take 145 mg by mouth every morning.     . furosemide (LASIX) 20 MG tablet Take 20 mg by mouth every morning.    Marland Kitchen glipiZIDE (GLUCOTROL XL) 10 MG 24 hr tablet Take 10 mg by mouth daily with breakfast.    . insulin glargine (LANTUS) 100 UNIT/ML injection Inject 55 Units into the skin at bedtime.     Marland Kitchen lisinopril (PRINIVIL,ZESTRIL) 20 MG tablet Take 20 mg by mouth every morning.    Marland Kitchen omeprazole (PRILOSEC) 40 MG capsule Take 40 mg by mouth every morning.     . pravastatin (PRAVACHOL) 40 MG tablet Take 40 mg by mouth.     No current facility-administered medications for this visit.    Past Medical History  Diagnosis Date  . Hyperlipidemia   . Diabetic neuropathy   . GERD (gastroesophageal reflux disease)   . Arthritis   . Hypertension   . Depression        . Type 2 diabetes mellitus   . History of kidney stones   . Retained ureteral stent     w/ encrustation SINCE 2012  . Legal blindness of left eye, as defined in [place]     SECONDARY TO RETAINAL  DETACHMENT  . History of retinal detachment   . Rotator cuff syndrome of right shoulder   . Restless leg syndrome   . At risk for sleep apnea     STOP-BANG= 7    SENT TO PCP 06-29-2014  . CKD (chronic kidney disease), stage II     montitored by nephrologist    Past Surgical History  Procedure Laterality Date  . 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 Cardiovascular stress test  04-05-2014  dr croitoru    low risk lexiscan nuclear study with mild diaphragmatic attenuation artifact/  no ischemia/  ef 58%  . Tonsillectomy and adenoidectomy  as child  . Cataract extraction w/ intraocular lens  implant, bilateral  2013  . I & d  infected spider bite upper back  06/ 2012  . Retinal detachment surgery Left 2013  . Cysto /  left ureteral stent placement  2012  . Cystoscopy w/ ureteral stent removal Left 07/07/2014    Procedure: CYSTO WITH LEFT PORTION STENT REMOVAL;  Surgeon: Malka So, MD;  Location: [facility];  Service: Urology;  Laterality: Left;  . Cystoscopy with ureteroscopy and stent placement Left 07/07/2014    Procedure: CYSTOLITHALOPAXY URETEROSCOPY WITH STENT;  Surgeon: Jenny Reichmann  Keene Breath, MD;  Location: [facility];  Service: Urology;  Laterality: Left;  . Holmium laser application N/A [id number]    Procedure: HOLMIUM LASER APPLICATION;  Surgeon: Malka So, MD;  Location: [facility];  Service: Urology;  Laterality: N/A;  . Nephrolithotomy Left 08/16/2014    Procedure: LEFT NEPHROLITHOTOMY PERCUTANEOUS;  Surgeon: Malka So, MD;  Location: [facility];  Service: Urology;  Laterality: Left;  . Nephrolithotomy Left 08/18/2014    Procedure: LEFT NEPHROLITHOTOMY PERCUTANEOUS SECOND LOOK;  Surgeon: Malka So, MD;  Location: [facility];  Service: Urology;  Laterality: Left;    ROS:  As stated in the HPI and negative for all other systems.  PHYSICAL EXAM BP 136/80 mmHg  Pulse 88  Ht 5' 10.5" (1.791 m)   Wt 321 lb (145.605 kg)  BMI 45.39 kg/m2 GENERAL:  Well appearing NECK:  No jugular venous distention, waveform within normal limits, carotid upstroke brisk and symmetric, no bruits, no thyromegaly LUNGS:  Clear to auscultation bilaterally CHEST:  Unremarkable HEART:  PMI not displaced or sustained,S1 and S2 within normal limits, no S3, no S4, no clicks, no rubs, no murmurs ABD:  Flat, positive bowel sounds normal in frequency in pitch, no bruits, no rebound, no guarding, no midline pulsatile mass, no hepatomegaly, no splenomegaly,obese EXT:  2 plus pulses throughout, mild edema, no cyanosis no clubbing   ASSESSMENT AND PLAN  CHEST PAIN:  He has some atypical discomfort not different from symptoms he had prior stress testing last September. There are no indicators that this is acute coronary syndrome or new obstructive disease. He certainly is at high risk for future cardiovascular events because of his risk factors but at this point I don't think further cardiovascular testing is indicated. He needs aggressive primary risk reduction.  SNORING:  His wife describes apnea but he has refused sleep testing.   HTN:  His blood pressure is well controlled typically. He'll continue the meds as listed.  DM:   This is being followed by Sherrie Mustache, MD . Of note he did have his insulin increased recently because his A1c was up from his previous. He says it was greater than 8.

## 2015-01-11 ENCOUNTER — Ambulatory Visit: Payer: Medicaid Other | Admitting: Cardiology

## 2015-05-31 ENCOUNTER — Emergency Department (HOSPITAL_COMMUNITY)
Admission: EM | Admit: 2015-05-31 | Discharge: 2015-05-31 | Disposition: A | Discharge status: Home / Self Care | Payer: Medicaid Other | Attending: Emergency Medicine | Admitting: Emergency Medicine

## 2015-05-31 ENCOUNTER — Encounter (HOSPITAL_COMMUNITY): Payer: Self-pay | Admitting: *Deleted

## 2015-05-31 ENCOUNTER — Emergency Department (HOSPITAL_COMMUNITY): Payer: Medicaid Other

## 2015-05-31 DIAGNOSIS — Z794 Long term (current) use of insulin: Secondary | ICD-10-CM | POA: Diagnosis not present

## 2015-05-31 DIAGNOSIS — Z8659 Personal history of other mental and behavioral disorders: Secondary | ICD-10-CM | POA: Insufficient documentation

## 2015-05-31 DIAGNOSIS — K5900 Constipation, unspecified: Secondary | ICD-10-CM | POA: Diagnosis not present

## 2015-05-31 DIAGNOSIS — N289 Disorder of kidney and ureter, unspecified: Secondary | ICD-10-CM

## 2015-05-31 DIAGNOSIS — R109 Unspecified abdominal pain: Secondary | ICD-10-CM | POA: Diagnosis present

## 2015-05-31 DIAGNOSIS — H548 Legal blindness, as defined in USA: Secondary | ICD-10-CM | POA: Diagnosis not present

## 2015-05-31 DIAGNOSIS — R61 Generalized hyperhidrosis: Secondary | ICD-10-CM | POA: Insufficient documentation

## 2015-05-31 DIAGNOSIS — E1140 Type 2 diabetes mellitus with diabetic neuropathy, unspecified: Secondary | ICD-10-CM | POA: Diagnosis not present

## 2015-05-31 DIAGNOSIS — E785 Hyperlipidemia, unspecified: Secondary | ICD-10-CM | POA: Diagnosis not present

## 2015-05-31 DIAGNOSIS — N200 Calculus of kidney: Secondary | ICD-10-CM

## 2015-05-31 DIAGNOSIS — Z79899 Other long term (current) drug therapy: Secondary | ICD-10-CM | POA: Diagnosis not present

## 2015-05-31 DIAGNOSIS — K219 Gastro-esophageal reflux disease without esophagitis: Secondary | ICD-10-CM | POA: Insufficient documentation

## 2015-05-31 DIAGNOSIS — Z960 Presence of urogenital implants: Secondary | ICD-10-CM | POA: Diagnosis not present

## 2015-05-31 DIAGNOSIS — I129 Hypertensive chronic kidney disease with stage 1 through stage 4 chronic kidney disease, or unspecified chronic kidney disease: Secondary | ICD-10-CM | POA: Insufficient documentation

## 2015-05-31 DIAGNOSIS — M1990 Unspecified osteoarthritis, unspecified site: Secondary | ICD-10-CM | POA: Diagnosis not present

## 2015-05-31 DIAGNOSIS — N182 Chronic kidney disease, stage 2 (mild): Secondary | ICD-10-CM | POA: Insufficient documentation

## 2015-05-31 LAB — CBC WITH DIFFERENTIAL/PLATELET
BASOS PCT: 0 %
Basophils Absolute: 0 10*3/uL (ref 0.0–0.1)
Eosinophils Absolute: 0 10*3/uL (ref 0.0–0.7)
Eosinophils Relative: 0 %
HEMATOCRIT: 42.7 % (ref 39.0–52.0)
HEMOGLOBIN: 14.4 g/dL (ref 13.0–17.0)
Lymphocytes Relative: 6 %
Lymphs Abs: 0.9 10*3/uL (ref 0.7–4.0)
MCH: 29.8 pg (ref 26.0–34.0)
MCHC: 33.7 g/dL (ref 30.0–36.0)
MCV: 88.4 fL (ref 78.0–100.0)
Monocytes Absolute: 0.7 10*3/uL (ref 0.1–1.0)
Monocytes Relative: 5 %
NEUTROS PCT: 89 %
Neutro Abs: 12.4 10*3/uL — ABNORMAL HIGH (ref 1.7–7.7)
Platelets: 235 10*3/uL (ref 150–400)
RBC: 4.83 MIL/uL (ref 4.22–5.81)
RDW: 12.9 % (ref 11.5–15.5)
WBC: 14.1 10*3/uL — ABNORMAL HIGH (ref 4.0–10.5)

## 2015-05-31 LAB — COMPREHENSIVE METABOLIC PANEL
ALK PHOS: 67 U/L (ref 38–126)
ALT: 18 U/L (ref 17–63)
ANION GAP: 11 (ref 5–15)
AST: 26 U/L (ref 15–41)
Albumin: 4.5 g/dL (ref 3.5–5.0)
BUN: 33 mg/dL — ABNORMAL HIGH (ref 6–20)
CALCIUM: 9.8 mg/dL (ref 8.9–10.3)
CO2: 26 mmol/L (ref 22–32)
Chloride: 102 mmol/L (ref 101–111)
Creatinine, Ser: 2.01 mg/dL — ABNORMAL HIGH (ref 0.61–1.24)
GFR calc Af Amer: 42 mL/min — ABNORMAL LOW (ref >60)
GFR calc non Af Amer: 36 mL/min — ABNORMAL LOW (ref >60)
Glucose, Bld: 239 mg/dL — ABNORMAL HIGH (ref 65–99)
POTASSIUM: 4.6 mmol/L (ref 3.5–5.1)
SODIUM: 139 mmol/L (ref 135–145)
Total Bilirubin: 0.8 mg/dL (ref 0.3–1.2)
Total Protein: 8.5 g/dL — ABNORMAL HIGH (ref 6.5–8.1)

## 2015-05-31 LAB — URINALYSIS, ROUTINE W REFLEX MICROSCOPIC
BILIRUBIN URINE: NEGATIVE
Glucose, UA: >1000 mg/dL — AB
Ketones, ur: NEGATIVE mg/dL
Leukocytes, UA: NEGATIVE
NITRITE: NEGATIVE
Protein, ur: 100 mg/dL — AB
SPECIFIC GRAVITY, URINE: 1.023 (ref 1.005–1.030)
Urobilinogen, UA: 0.2 mg/dL (ref 0.0–1.0)
pH: 6.5 (ref 5.0–8.0)

## 2015-05-31 LAB — URINE MICROSCOPIC-ADD ON

## 2015-05-31 MED ORDER — ONDANSETRON HCL 4 MG PO TABS: 4.0000 mg | ORAL_TABLET | Freq: Four times a day (QID) | ORAL | Status: DC

## 2015-05-31 MED ORDER — IBUPROFEN 800 MG PO TABS: 800.0000 mg | ORAL_TABLET | Freq: Three times a day (TID) | ORAL | Status: DC

## 2015-05-31 MED ORDER — KETOROLAC TROMETHAMINE 30 MG/ML IJ SOLN
30.0000 mg | Freq: Once | INTRAMUSCULAR | Status: AC
  Administered 2015-05-31: 30 mg via INTRAVENOUS
  Filled 2015-05-31: qty 1

## 2015-05-31 MED ORDER — TAMSULOSIN HCL 0.4 MG PO CAPS: 0.4000 mg | ORAL_CAPSULE | Freq: Every day | ORAL | Status: DC

## 2015-05-31 MED ORDER — IOHEXOL 300 MG/ML  SOLN: 50.0000 mL | Freq: Once | INTRAMUSCULAR | Status: DC | PRN

## 2015-05-31 MED ORDER — OXYCODONE-ACETAMINOPHEN 5-325 MG PO TABS: 1.0000 | ORAL_TABLET | ORAL | Status: DC | PRN

## 2015-05-31 NOTE — ED Notes (Signed)
Bed: WA17 Expected date:  Expected time:  Means of arrival:  Comments: EMS flank pain

## 2015-05-31 NOTE — Discharge Instructions (Signed)
Stay hydrated and drink plenty of water.  Take medications as prescribed. Follow up with primary doctor in 3 days.  Please return if any new or worsening symptoms.  Kidney Stones Kidney stones (urolithiasis) are solid masses that form inside your kidneys. The intense pain is caused by the stone moving through the kidney, ureter, bladder, and urethra (urinary tract). When the stone moves, the ureter starts to spasm around the stone. The stone is usually passed in your pee (urine).  HOME CARE  Drink enough fluids to keep your pee clear or pale yellow. This helps to get the stone out.  Take a 24-hour pee (urine) sample as told by your doctor. You may need to take another sample every 6-12 months.  Strain all pee through the provided strainer. Do not pee without peeing through the strainer, not even once. If you pee the stone out, catch it in the strainer. The stone may be as small as a grain of salt. Take this to your doctor. This will help your doctor figure out what you can do to try to prevent more kidney stones.  Only take medicine as told by your doctor.  Make changes to your daily diet as told by your doctor. You may be told to:  Limit how much salt you eat.  Eat 5 or more servings of fruits and vegetables each day.  Limit how much meat, poultry, fish, and eggs you eat.  Keep all follow-up visits as told by your doctor. This is important.  Get follow-up X-rays as told by your doctor. GET HELP IF: You have pain that gets worse even if you have been taking pain medicine. GET HELP RIGHT AWAY IF:   Your pain does not get better with medicine.  You have a fever or shaking chills.  Your pain increases and gets worse over 18 hours.  You have new belly (abdominal) pain.  You feel faint or pass out.  You are unable to pee.   This information is not intended to replace advice given to you by your health care provider. Make sure you discuss any questions you have with your health  care provider.   Document Released: 01/08/2008 Document Revised: 04/12/2015 Document Reviewed: 12/23/2012 Elsevier Interactive Patient Education [employer].

## 2015-05-31 NOTE — ED Provider Notes (Signed)
CSN: [id number]     Arrival date & time 05/31/15  [id number] History   First MD Initiated Contact with Patient 05/31/15 [phone]     Chief Complaint  Patient presents with  . Flank Pain     (Consider location/radiation/quality/duration/timing/severity/associated sxs/prior Treatment) Patient is a 54 y.o. male presenting with flank pain. The history is provided by the patient and medical records. No language interpreter was used.  Flank Pain Associated symptoms include diaphoresis, nausea and vomiting. Pertinent negatives include no abdominal pain, arthralgias, congestion, coughing, headaches, myalgias, neck pain, rash, sore throat or weakness.   LAMIR SYDNEY is a 54 yo CM with PMH of multiple episodes of nephrolithiasis and CKD II who presents with back and flank pain since yesterday afternoon. Pain is described as a "twinge of pain" and "spasms", waxing and waning, and currently a 4/10 (8/10 at its worst). Location: right low back with radiation of pain anterior toward RLQ/groin, worse with spasms. Associated symptoms include nausea and two episodes of vomiting beginning this morning. Patient denies any alleviating or aggravating factors and has not tried any medications prior to arrival to ease pain.   Past Medical History  Diagnosis Date  . Hyperlipidemia   . Diabetic neuropathy ([place])   . GERD (gastroesophageal reflux disease)   . Arthritis   . Hypertension   . Depression        . Type 2 diabetes mellitus ([place])   . History of kidney stones   . Retained ureteral stent     w/ encrustation SINCE 2012  . 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 History of retinal detachment   . Rotator cuff syndrome of right shoulder   . Restless leg syndrome   . At risk for sleep apnea     STOP-BANG= 7    SENT TO PCP 06-29-2014  . CKD (chronic kidney disease), stage II     montitored by nephrologist   Past Surgical History  Procedure Laterality Date  .  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 Cardiovascular stress test  04-05-2014  dr croitoru    low risk lexiscan nuclear study with mild diaphragmatic attenuation artifact/  no ischemia/  ef 58%  . Tonsillectomy and adenoidectomy  as child  . Cataract extraction w/ intraocular lens  implant, bilateral  2013  . I & d  infected spider bite upper back  06/ 2012  . Retinal detachment surgery Left 2013  . Cysto /  left ureteral stent placement  2012  . Cystoscopy w/ ureteral stent removal Left 07/07/2014    Procedure: CYSTO WITH LEFT PORTION STENT REMOVAL;  Surgeon: Malka So, MD;  Location: [facility];  Service: Urology;  Laterality: Left;  . Cystoscopy with ureteroscopy and stent placement Left 07/07/2014    Procedure: CYSTOLITHALOPAXY URETEROSCOPY WITH STENT;  Surgeon: Malka So, MD;  Location: [facility];  Service: Urology;  Laterality: Left;  . Holmium laser application N/A [id number]    Procedure: HOLMIUM LASER APPLICATION;  Surgeon: Malka So, MD;  Location: [facility];  Service: Urology;  Laterality: N/A;  . Nephrolithotomy Left 08/16/2014    Procedure: LEFT NEPHROLITHOTOMY PERCUTANEOUS;  Surgeon: Malka So, MD;  Location: [facility];  Service: Urology;  Laterality: Left;  . Nephrolithotomy Left 08/18/2014    Procedure: LEFT NEPHROLITHOTOMY PERCUTANEOUS SECOND LOOK;  Surgeon: Marshall Cork  Jeffie Pollock, MD;  Location: [facility];  Service: Urology;  Laterality: Left;   Family History  Problem Relation Age of Onset  . Cancer Mother     Throat   Social History  Substance Use Topics  . Smoking status: Never Smoker   . Smokeless tobacco: Never Used  . Alcohol Use: Yes     Comment: Occassionally    Review of Systems  Constitutional: Positive for diaphoresis.  HENT: Negative for congestion, rhinorrhea and sore throat.   Respiratory: Negative for cough, shortness of breath and wheezing.   Cardiovascular:  Negative.   Gastrointestinal: Positive for nausea, vomiting and constipation. Negative for abdominal pain, diarrhea and blood in stool.  Endocrine: Negative for polydipsia and polyuria.  Genitourinary: Positive for flank pain. Negative for urgency, frequency, hematuria, decreased urine volume and difficulty urinating.  Musculoskeletal: Negative for myalgias, back pain, arthralgias and neck pain.  Skin: Negative for rash.  Neurological: Negative for dizziness, weakness and headaches.      Allergies  Review of patient's allergies indicates no known allergies.  Home Medications   Prior to Admission medications   Medication Sig Start Date End Date Taking? Authorizing Provider  acetaminophen (TYLENOL) 500 MG tablet Take 1,000 mg by mouth every 6 (six) hours as needed for mild pain or moderate pain.    Historical Provider, MD  fenofibrate (TRICOR) 145 MG tablet Take 145 mg by mouth every morning.     Historical Provider, MD  furosemide (LASIX) 20 MG tablet Take 20 mg by mouth every morning.    Historical Provider, MD  glipiZIDE (GLUCOTROL XL) 10 MG 24 hr tablet Take 10 mg by mouth daily with breakfast.    Historical Provider, MD  insulin glargine (LANTUS) 100 UNIT/ML injection Inject 55 Units into the skin at bedtime.     Historical Provider, MD  lisinopril (PRINIVIL,ZESTRIL) 20 MG tablet Take 20 mg by mouth every morning.    Historical Provider, MD  omeprazole (PRILOSEC) 40 MG capsule Take 40 mg by mouth every morning.     Historical Provider, MD  pravastatin (PRAVACHOL) 40 MG tablet Take 40 mg by mouth. 09/05/14 09/05/15  Historical Provider, MD   BP 215/97 mmHg  Pulse 84  Temp(Src) 98.1 F (36.7 C) (Oral)  Resp 18  Ht 5\' 10"  (1.778 m)  Wt 329 lb (149.233 kg)  BMI 47.21 kg/m2  SpO2 93% Physical Exam  Constitutional: He is oriented to person, place, and time. He appears well-developed and well-nourished.  HENT:  Head: Normocephalic and atraumatic.  Cardiovascular: Normal rate, regular  rhythm, normal heart sounds and intact distal pulses.  Exam reveals no gallop and no friction rub.   No murmur heard. Pulmonary/Chest: Effort normal and breath sounds normal. No respiratory distress. He has no wheezes. He has no rales. He exhibits no tenderness.  Abdominal: Soft. Bowel sounds are normal. He exhibits no distension and no mass. There is tenderness. There is no rebound and no guarding.  Abdomen soft, non-distended Bowel sounds positive in all four quadrants Generalized abdominal tenderness    Musculoskeletal: He exhibits no edema.  No CVA tenderness   Neurological: He is alert and oriented to person, place, and time.  Skin: Skin is warm. No rash noted. He is diaphoretic.  Psychiatric: He has a normal mood and affect. His behavior is normal. Judgment and thought content normal.    ED Course  Procedures (including critical care time) Labs Review Labs Reviewed  COMPREHENSIVE METABOLIC PANEL  CBC WITH DIFFERENTIAL/PLATELET  URINALYSIS, ROUTINE W REFLEX  MICROSCOPIC (NOT AT [facility])    Imaging Review No results found. I have personally reviewed and evaluated these images and lab results as part of my medical decision-making.   EKG Interpretation None      MDM   Final diagnoses:  None   CAMRYNN MACARI has PMH of multiple kidney stones with intervention required in the past. Denies hematuria. Renal US done at bedside showed signs of hydronephrosis of the right kidney, therefore formal CT imaging was done. CT results showed a 25mm stone in the right vesicoureteral junction which due to the location and size, he can pass at home. Labs reviewed. Urine shows no signs of infection. WBC 14.1, likely inflammatory since urine is clean. Signs of renal insufficiency. Stress importance of maintaining hydration when at home.   Patient to be discharged with flomax, zofran, percocet for pain, and ibuprofen for inflammation with detailed instructions of reasons to return and follow up.    Ozella Almond Salah Nakamura, PA-C     [facility] Ai Sonnenfeld, [place] 05/31/15 [place], MD 06/01/15 [phone]

## 2015-05-31 NOTE — ED Provider Notes (Signed)
That Pt has had onset of R flank pain starting yesterday, intermittent, sharp and stabbing, associated vomiting, hx of multiple stones and interventional procedures, under care of Dr. Jeffie Pollock.  On exam has soft obese abd, mild R flank pain.  EMERGENCY DEPARTMENT US RENAL EXAM  "Study: Limited Retroperitoneal Ultrasound of Kidneys"  INDICATIONS: Flank pain  Long and short axis of both kidneys were obtained.   PERFORMED BY: Myself  IMAGES ARCHIVED?: Yes  LIMITATIONS: Body habitus  VIEWS USED: Long axis and Short axis   INTERPRETATION: Right Hydronephrosis mild   CPT Code: [phone] (limited retroperitoneal)  Medical screening examination/treatment/procedure(s) were conducted as a shared visit with non-physician practitioner(s) and myself.  I personally evaluated the patient during the encounter.  Clinical Impression:   Final diagnoses:  Nephrolithiasis  Renal insufficiency           Noemi Chapel, MD 06/01/15 1531

## 2015-05-31 NOTE — ED Notes (Signed)
Pt arrives to the ER via EMS for complaint of flank pain; pt states that yesterday he began having rt flank / back pain; pt states that the pain radiates to RLQ; pt reports nausea x 2 days; vomiting x 1 this am; pt was given 4mg  Zofran IM; pt states that this is worse than previous kidney stones; pt also diaphoretic upon EMS arrival

## 2016-01-15 IMAGING — CR DG ABDOMEN 1V
2 series · 2 of 2 positions shown · non-contrast
Comparison: CT scan 06/20/2014

CLINICAL DATA: Preop for lithotripsy.

EXAM:
ABDOMEN - 1 VIEW

[t abdomen supine (1 of 2)]
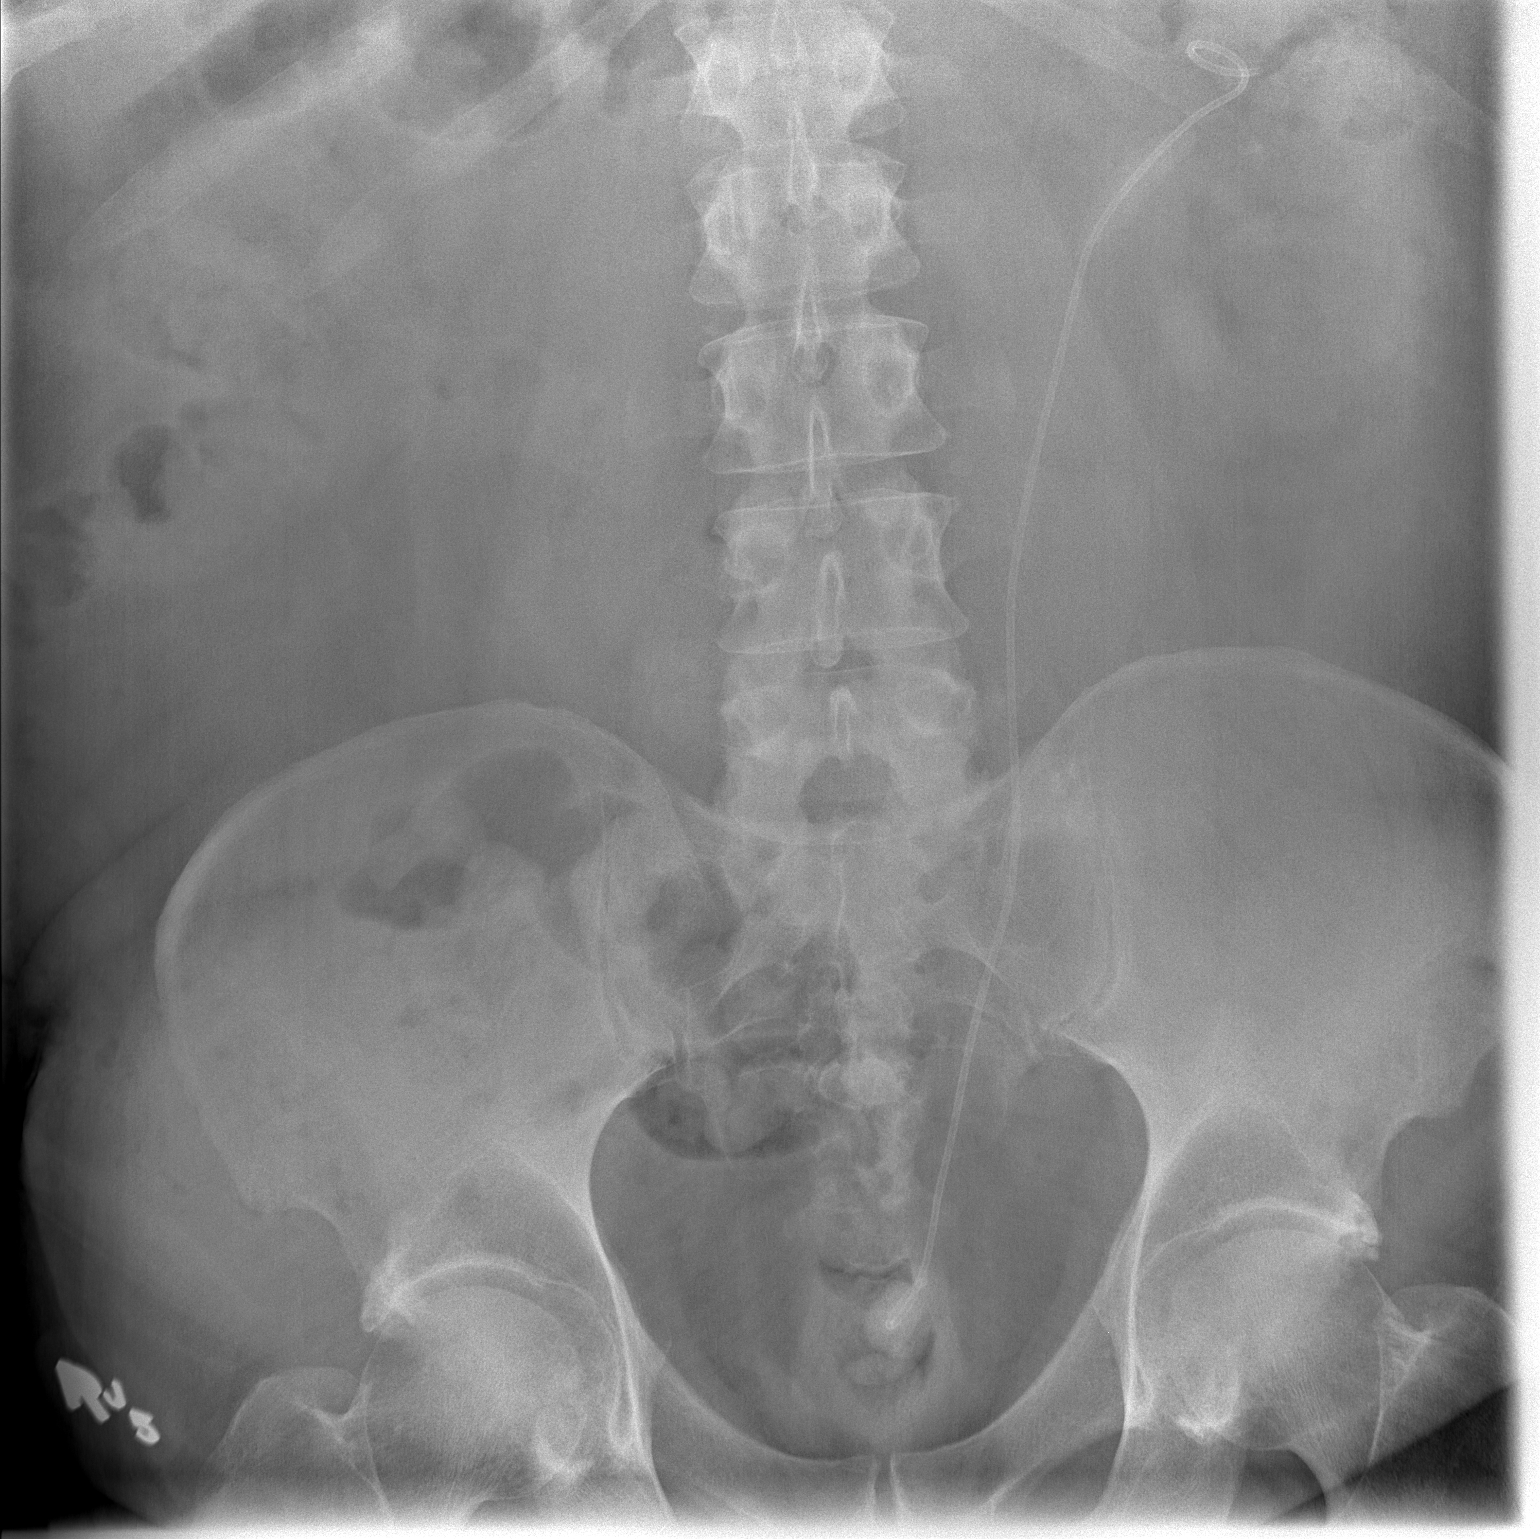

[t abdomen supine (2 of 2)]
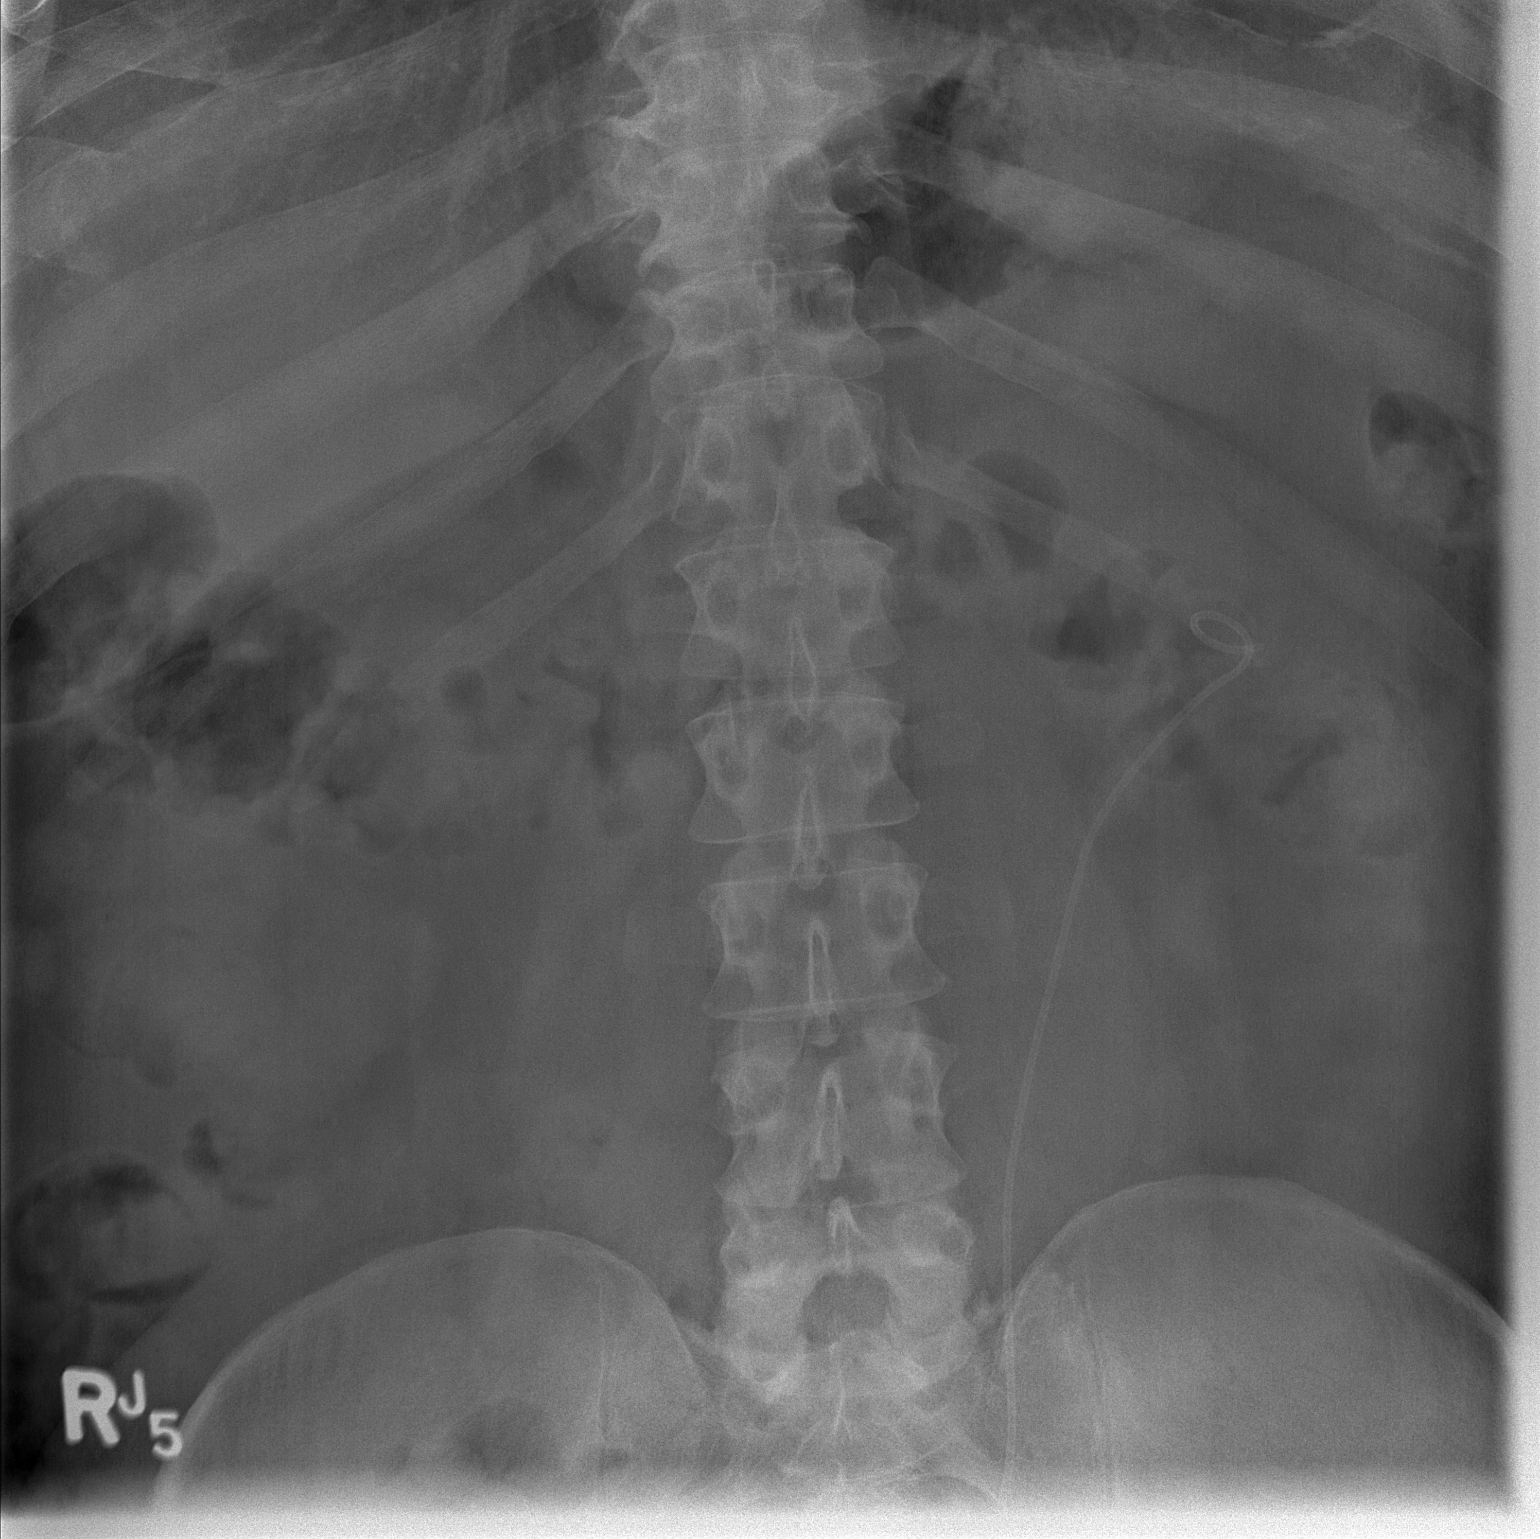

[2 of 2 positions shown; findings below may reference images not displayed]

FINDINGS: Stable left-sided double-J ureteral stent. Stable thick encrusting
calcifications surrounding the upper left ureter and also around the
loop of the catheter in the bladder. No definite calculi along the
course of the ureter.
IMPRESSION: Stable thick encrusting calcification involving the proximal left
double j stent and distal stent in the bladder.

## 2016-02-13 ENCOUNTER — Emergency Department (HOSPITAL_COMMUNITY): Payer: Medicare Other

## 2016-02-13 ENCOUNTER — Encounter (HOSPITAL_COMMUNITY): Payer: Self-pay | Admitting: *Deleted

## 2016-02-13 ENCOUNTER — Inpatient Hospital Stay (HOSPITAL_COMMUNITY)
Admission: EM | Admit: 2016-02-13 | Discharge: 2016-02-16 | DRG: 064 | Disposition: A | Discharge status: Skilled Nursing Facility | Payer: Medicare Other | Attending: Internal Medicine | Admitting: Internal Medicine

## 2016-02-13 DIAGNOSIS — G9340 Encephalopathy, unspecified: Secondary | ICD-10-CM | POA: Diagnosis present

## 2016-02-13 DIAGNOSIS — I129 Hypertensive chronic kidney disease with stage 1 through stage 4 chronic kidney disease, or unspecified chronic kidney disease: Secondary | ICD-10-CM | POA: Diagnosis present

## 2016-02-13 DIAGNOSIS — E1122 Type 2 diabetes mellitus with diabetic chronic kidney disease: Secondary | ICD-10-CM | POA: Diagnosis present

## 2016-02-13 DIAGNOSIS — Z6841 Body Mass Index (BMI) 40.0 and over, adult: Secondary | ICD-10-CM

## 2016-02-13 DIAGNOSIS — Z794 Long term (current) use of insulin: Secondary | ICD-10-CM | POA: Diagnosis not present

## 2016-02-13 DIAGNOSIS — E1159 Type 2 diabetes mellitus with other circulatory complications: Secondary | ICD-10-CM

## 2016-02-13 DIAGNOSIS — E119 Type 2 diabetes mellitus without complications: Secondary | ICD-10-CM | POA: Insufficient documentation

## 2016-02-13 DIAGNOSIS — E1140 Type 2 diabetes mellitus with diabetic neuropathy, unspecified: Secondary | ICD-10-CM | POA: Diagnosis present

## 2016-02-13 DIAGNOSIS — E871 Hypo-osmolality and hyponatremia: Secondary | ICD-10-CM | POA: Diagnosis present

## 2016-02-13 DIAGNOSIS — Z87442 Personal history of urinary calculi: Secondary | ICD-10-CM

## 2016-02-13 DIAGNOSIS — E860 Dehydration: Secondary | ICD-10-CM | POA: Diagnosis present

## 2016-02-13 DIAGNOSIS — G2581 Restless legs syndrome: Secondary | ICD-10-CM | POA: Diagnosis present

## 2016-02-13 DIAGNOSIS — G459 Transient cerebral ischemic attack, unspecified: Secondary | ICD-10-CM | POA: Diagnosis not present

## 2016-02-13 DIAGNOSIS — R531 Weakness: Secondary | ICD-10-CM | POA: Diagnosis not present

## 2016-02-13 DIAGNOSIS — H548 Legal blindness, as defined in USA: Secondary | ICD-10-CM

## 2016-02-13 DIAGNOSIS — F329 Major depressive disorder, single episode, unspecified: Secondary | ICD-10-CM | POA: Diagnosis present

## 2016-02-13 DIAGNOSIS — Y92009 Unspecified place in unspecified non-institutional (private) residence as the place of occurrence of the external cause: Secondary | ICD-10-CM

## 2016-02-13 DIAGNOSIS — G8194 Hemiplegia, unspecified affecting left nondominant side: Secondary | ICD-10-CM | POA: Diagnosis present

## 2016-02-13 DIAGNOSIS — Z809 Family history of malignant neoplasm, unspecified: Secondary | ICD-10-CM | POA: Diagnosis not present

## 2016-02-13 DIAGNOSIS — I10 Essential (primary) hypertension: Secondary | ICD-10-CM | POA: Insufficient documentation

## 2016-02-13 DIAGNOSIS — E785 Hyperlipidemia, unspecified: Secondary | ICD-10-CM | POA: Diagnosis present

## 2016-02-13 DIAGNOSIS — R410 Disorientation, unspecified: Secondary | ICD-10-CM

## 2016-02-13 DIAGNOSIS — N182 Chronic kidney disease, stage 2 (mild): Secondary | ICD-10-CM | POA: Diagnosis not present

## 2016-02-13 DIAGNOSIS — I639 Cerebral infarction, unspecified: Principal | ICD-10-CM | POA: Insufficient documentation

## 2016-02-13 DIAGNOSIS — W19XXXA Unspecified fall, initial encounter: Secondary | ICD-10-CM

## 2016-02-13 DIAGNOSIS — K219 Gastro-esophageal reflux disease without esophagitis: Secondary | ICD-10-CM | POA: Diagnosis present

## 2016-02-13 LAB — URINE MICROSCOPIC-ADD ON

## 2016-02-13 LAB — RAPID URINE DRUG SCREEN, HOSP PERFORMED
AMPHETAMINES: NOT DETECTED
Barbiturates: NOT DETECTED
Benzodiazepines: NOT DETECTED
Cocaine: NOT DETECTED
Opiates: NOT DETECTED
Tetrahydrocannabinol: NOT DETECTED

## 2016-02-13 LAB — TROPONIN I: Troponin I: 0.05 ng/mL (ref <0.03)

## 2016-02-13 LAB — COMPREHENSIVE METABOLIC PANEL
ALBUMIN: 3.3 g/dL — AB (ref 3.5–5.0)
ALT: 22 U/L (ref 17–63)
AST: 22 U/L (ref 15–41)
Alkaline Phosphatase: 71 U/L (ref 38–126)
Anion gap: 10 (ref 5–15)
BILIRUBIN TOTAL: 1 mg/dL (ref 0.3–1.2)
BUN: 25 mg/dL — AB (ref 6–20)
CALCIUM: 8.3 mg/dL — AB (ref 8.9–10.3)
CO2: 22 mmol/L (ref 22–32)
Chloride: 99 mmol/L — ABNORMAL LOW (ref 101–111)
Creatinine, Ser: 1.47 mg/dL — ABNORMAL HIGH (ref 0.61–1.24)
GFR calc Af Amer: >60 mL/min (ref >60)
GFR calc non Af Amer: 53 mL/min — ABNORMAL LOW (ref >60)
GLUCOSE: 227 mg/dL — AB (ref 65–99)
Potassium: 3.9 mmol/L (ref 3.5–5.1)
Sodium: 131 mmol/L — ABNORMAL LOW (ref 135–145)
TOTAL PROTEIN: 6.6 g/dL (ref 6.5–8.1)

## 2016-02-13 LAB — CBC WITH DIFFERENTIAL/PLATELET
BASOS ABS: 0 10*3/uL (ref 0.0–0.1)
BASOS PCT: 0 %
EOS PCT: 0 %
Eosinophils Absolute: 0 10*3/uL (ref 0.0–0.7)
HEMATOCRIT: 39 % (ref 39.0–52.0)
Hemoglobin: 13.3 g/dL (ref 13.0–17.0)
LYMPHS PCT: 12 %
Lymphs Abs: 1.5 10*3/uL (ref 0.7–4.0)
MCH: 29.5 pg (ref 26.0–34.0)
MCHC: 34.1 g/dL (ref 30.0–36.0)
MCV: 86.5 fL (ref 78.0–100.0)
Monocytes Absolute: 1 10*3/uL (ref 0.1–1.0)
Monocytes Relative: 8 %
NEUTROS ABS: 9.6 10*3/uL — AB (ref 1.7–7.7)
Neutrophils Relative %: 80 %
Platelets: 215 10*3/uL (ref 150–400)
RBC: 4.51 MIL/uL (ref 4.22–5.81)
RDW: 13 % (ref 11.5–15.5)
WBC: 12 10*3/uL — AB (ref 4.0–10.5)

## 2016-02-13 LAB — URINALYSIS, ROUTINE W REFLEX MICROSCOPIC
Leukocytes, UA: NEGATIVE
Nitrite: NEGATIVE
Specific Gravity, Urine: >1.03 — ABNORMAL HIGH (ref 1.005–1.030)
pH: 6 (ref 5.0–8.0)

## 2016-02-13 LAB — CBG MONITORING, ED: GLUCOSE-CAPILLARY: 230 mg/dL — AB (ref 65–99)

## 2016-02-13 LAB — LIPASE, BLOOD: Lipase: 13 U/L (ref 11–51)

## 2016-02-13 MED ORDER — SODIUM CHLORIDE 0.9 % IV BOLUS (SEPSIS)
1000.0000 mL | Freq: Once | INTRAVENOUS | Status: AC
  Administered 2016-02-13: 1000 mL via INTRAVENOUS

## 2016-02-13 MED ORDER — ASPIRIN 325 MG PO TABS
325.0000 mg | ORAL_TABLET | Freq: Once | ORAL | Status: AC
  Administered 2016-02-13: 325 mg via ORAL
  Filled 2016-02-13: qty 1

## 2016-02-13 MED ORDER — ONDANSETRON HCL 4 MG/2ML IJ SOLN
4.0000 mg | Freq: Once | INTRAMUSCULAR | Status: AC
  Administered 2016-02-13: 4 mg via INTRAVENOUS
  Filled 2016-02-13: qty 2

## 2016-02-13 NOTE — ED Notes (Signed)
Pt unable to give urine specimen at this time 

## 2016-02-13 NOTE — ED Notes (Signed)
Pt comes in by EMS for fall. Pt lives at home by himself and has people check on him daily. Pt had a fall yesterday at 5 pm, he was found by friends this morning at 0800 but refused for them to call EMS. Pt then had another friend check on him and called EMS. Pt states he is unsure why he fell (he is legally blind in the left eye). Pt states his head is hurting from a sinus infection. Pt states he was unable to get up because he felt so bad.

## 2016-02-13 NOTE — ED Notes (Addendum)
Friends contact info:  April Brame [phone]

## 2016-02-13 NOTE — ED Notes (Signed)
MD at bedside. 

## 2016-02-13 NOTE — ED Provider Notes (Signed)
CSN: [id number]     Arrival date & time 02/13/16  1459 History   First MD Initiated Contact with Patient 02/13/16 1504     Chief Complaint  Patient presents with  . Fall     (Consider location/radiation/quality/duration/timing/severity/associated sxs/prior Treatment) HPI.....Marland KitchenMarland KitchenLevel V caveat for altered mental status. Patient  fell at home at 5 PM yesterday and was found by a friend at 8 AM today, but initially refused EMS transport. Later in the day another friend checked on him, and transport was arranged. Complains of frontal headache secondary to "sinus infection".  No fever, sweats, chills, stiff neck, chest pain, dyspnea  Past Medical History  Diagnosis Date  . Hyperlipidemia   . Diabetic neuropathy ([place])   . GERD (gastroesophageal reflux disease)   . Arthritis   . Hypertension   . Depression        . Type 2 diabetes mellitus ([place])   . History of kidney stones   . Retained ureteral stent     w/ encrustation SINCE 2012  . 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 History of retinal detachment   . Rotator cuff syndrome of right shoulder   . Restless leg syndrome   . At risk for sleep apnea     STOP-BANG= 7    SENT TO PCP 06-29-2014  . CKD (chronic kidney disease), stage II     montitored by nephrologist   Past Surgical History  Procedure Laterality Date  . 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 Cardiovascular stress test  04-05-2014  dr croitoru    low risk lexiscan nuclear study with mild diaphragmatic attenuation artifact/  no ischemia/  ef 58%  . Tonsillectomy and adenoidectomy  as child  . Cataract extraction w/ intraocular lens  implant, bilateral  2013  . I & d  infected spider bite upper back  06/ 2012  . Retinal detachment surgery Left 2013  . Cysto /  left ureteral stent placement  2012  . Cystoscopy w/ ureteral stent removal Left 07/07/2014    Procedure: CYSTO WITH LEFT  PORTION STENT REMOVAL;  Surgeon: Malka So, MD;  Location: [facility];  Service: Urology;  Laterality: Left;  . Cystoscopy with ureteroscopy and stent placement Left 07/07/2014    Procedure: CYSTOLITHALOPAXY URETEROSCOPY WITH STENT;  Surgeon: Malka So, MD;  Location[facility];  Service: Urology;  Laterality: Left;  . Holmium laser application N/A [id number]    Procedure: HOLMIUM LASER APPLICATION;  Surgeon: Malka So, MD;  Location: [facility];  Service: Urology;  Laterality: N/A;  . Nephrolithotomy Left 08/16/2014    Procedure: LEFT NEPHROLITHOTOMY PERCUTANEOUS;  Surgeon: Malka So, MD;  Location: [facility];  Service: Urology;  Laterality: Left;  . Nephrolithotomy Left 08/18/2014    Procedure: LEFT NEPHROLITHOTOMY PERCUTANEOUS SECOND LOOK;  Surgeon: Malka So, MD;  Location: [facility];  Service: Urology;  Laterality: Left;   Family History  Problem Relation Age of Onset  . Cancer Mother     Throat   Social History  Substance Use Topics  . Smoking status: Never Smoker   . Smokeless tobacco: Never Used  . Alcohol Use: Yes     Comment: Occassionally    Review of Systems  Reason unable to perform ROS: Altered mental status.      Allergies  Review of patient's allergies  indicates no known allergies.  Home Medications   Prior to Admission medications   Medication Sig Start Date End Date Taking? Authorizing Provider  acetaminophen (TYLENOL) 500 MG tablet Take 1,000 mg by mouth every 6 (six) hours as needed for mild pain or moderate pain.   Yes Historical Provider, MD  amoxicillin (AMOXIL) 500 MG capsule Take 500 mg by mouth 3 (three) times daily.   Yes Historical Provider, MD  Choline Fenofibrate (TRILIPIX) 135 MG capsule Take 135 mg by mouth daily.   Yes Historical Provider, MD  glipiZIDE (GLUCOTROL XL) 10 MG 24 hr tablet Take 20 mg by mouth at bedtime.    Yes Historical Provider, MD  insulin glargine (LANTUS) 100 UNIT/ML  injection Inject 100 Units into the skin at bedtime.    Yes Historical Provider, MD  losartan (COZAAR) 100 MG tablet Take 100 mg by mouth at bedtime. 05/19/15 05/18/16 Yes Historical Provider, MD  pravastatin (PRAVACHOL) 40 MG tablet Take 40 mg by mouth at bedtime.  09/05/14 02/13/16 Yes Historical Provider, MD   BP 113/59 mmHg  Pulse 96  Resp 20  SpO2 96% Physical Exam  Constitutional:  Obese, alert and oriented 3, but responds slowly to questions  HENT:  Head: Normocephalic and atraumatic.  Eyes: Conjunctivae are normal.  Neck: Neck supple.  Cardiovascular: Normal rate and regular rhythm.   Pulmonary/Chest: Effort normal and breath sounds normal.  Abdominal: Soft. Bowel sounds are normal.  Musculoskeletal: Normal range of motion.  Neurological:  Can move all extremities but slowly  Skin: Skin is warm and dry.  Psychiatric: He has a normal mood and affect. His behavior is normal.  Nursing note and vitals reviewed.   ED Course  Procedures (including critical care time) Labs Review Labs Reviewed  CBC WITH DIFFERENTIAL/PLATELET - Abnormal; Notable for the following:    WBC 12.0 (*)    Neutro Abs 9.6 (*)    All other components within normal limits  COMPREHENSIVE METABOLIC PANEL - Abnormal; Notable for the following:    Sodium 131 (*)    Chloride 99 (*)    Glucose, Bld 227 (*)    BUN 25 (*)    Creatinine, Ser 1.47 (*)    Calcium 8.3 (*)    Albumin 3.3 (*)    GFR calc non Af Amer 53 (*)    All other components within normal limits  URINALYSIS, ROUTINE W REFLEX MICROSCOPIC (NOT AT [facility]) - Abnormal; Notable for the following:    APPearance HAZY (*)    Specific Gravity, Urine >1.030 (*)    Glucose, UA >1000 (*)    Hgb urine dipstick LARGE (*)    Bilirubin Urine SMALL (*)    Ketones, ur TRACE (*)    Protein, ur >300 (*)    All other components within normal limits  URINE MICROSCOPIC-ADD ON - Abnormal; Notable for the following:    Squamous Epithelial / LPF 0-5 (*)     Bacteria, UA FEW (*)    Casts GRANULAR CAST (*)    Crystals URIC ACID CRYSTALS (*)    All other components within normal limits  CBG MONITORING, ED - Abnormal; Notable for the following:    Glucose-Capillary 230 (*)    All other components within normal limits  LIPASE, BLOOD  URINE RAPID DRUG SCREEN, HOSP PERFORMED  TROPONIN I    Imaging Review Ct Head Wo Contrast  02/13/2016  CLINICAL DATA:  Fall yesterday, head pain EXAM: CT HEAD WITHOUT CONTRAST TECHNIQUE: Contiguous axial images were obtained from  the base of the skull through the vertex without intravenous contrast. COMPARISON:  None. FINDINGS: No skull fracture is noted. Paranasal sinuses and mastoid air cells are unremarkable. No intracranial hemorrhage, mass effect or midline shift. Axial image twenty-five and 26 there is subtle area of cortical decreased attenuation in right frontal lobe. Subacute or evolving infarct cannot be excluded. Clinical correlation is necessary. Further correlation with MRI with diffusion imaging is recommended as clinically warranted. No mass lesion is noted on this unenhanced scan. No hydrocephalus. No intraventricular hemorrhage. There is focal decreased attenuation in right caudate nucleus measures about 5 mm. Also right temporal lobe axial image 14 This may represent infarct of indeterminate age. IMPRESSION: No intracranial hemorrhage, mass effect or midline shift. Axial image twenty-five and 26 there is subtle area of cortical decreased attenuation in right frontal lobe. Subacute or evolving infarct cannot be excluded. Further correlation with MRI is recommended as clinically warranted. There is focal decreased attenuation in right caudate nucleus measures about 5 mm. Also right temporal lobe axial image 14. This may represent infarct of indeterminate age. These results were called by telephone at the time of interpretation on 02/13/2016 at 7:36 pm to Dr. Nat Christen , who verbally acknowledged these results.  Electronically Signed   By: Lahoma Crocker M.D.   On: 02/13/2016 19:38   Dg Chest Port 1 View  02/13/2016  CLINICAL DATA:  Weakness, hypertension, diabetes mellitus, GERD EXAM: PORTABLE CHEST 1 VIEW COMPARISON:  Portable exam 1611 hours compared 08/16/2014 FINDINGS: Lordotic positioning. Low lung volumes. Upper normal heart size. Mediastinal contours and pulmonary vascularity normal. No gross infiltrate, pleural effusion or pneumothorax. IMPRESSION: Low lung volumes without definite infiltrate. Electronically Signed   By: Lavonia Dana M.D.   On: 02/13/2016 16:24   I have personally reviewed and evaluated these images and lab results as part of my medical decision-making.   EKG Interpretation   Date/Time:  Tuesday February 13 2016 22:28:18 EDT Ventricular Rate:  96 PR Interval:    QRS Duration: 81 QT Interval:  344 QTC Calculation: 435 R Axis:   -128 Text Interpretation:  Sinus rhythm Probable left atrial enlargement Right  axis deviation Consider anterior infarct ST elevation, consider inferior  injury Confirmed by Zakiah Beckerman  MD, Josilynn Losh ([id number]) on 02/13/2016 10:56:43 PM     CRITICAL CARE Performed by: Nat Christen Total critical care time: 30 minutes Critical care time was exclusive of separately billable procedures and treating other patients. Critical care was necessary to treat or prevent imminent or life-threatening deterioration. Critical care was time spent personally by me on the following activities: development of treatment plan with patient and/or surrogate as well as nursing, discussions with consultants, evaluation of patient's response to treatment, examination of patient, obtaining history from patient or surrogate, ordering and performing treatments and interventions, ordering and review of laboratory studies, ordering and review of radiographic studies, pulse oximetry and re-evaluation of patient's condition. MDM   Final diagnoses:  Fall, initial encounter  Cerebral infarction due to  unspecified mechanism    Patient presents with fall and altered mental status. CT scan suggests a possible right frontal lobe infarct. Discussed with neurologist Dr. [name].  Admit to general medicine.    Nat Christen, MD 02/13/16 [phone]

## 2016-02-13 NOTE — ED Notes (Addendum)
CRITICAL VALUE ALERT  Critical value received:  Troponin 0.05  Date of notification:  02/13/16  Time of notification:  2353  Critical value read back:Yes.    Nurse who received alert:  Derek Mound, RN  MD notified (1st page):  Shanon Brow  Time of first page:  2354  MD notified (2nd page):  Time of second page:  Responding MD:  Shanon Brow  Time MD responded:  2355

## 2016-02-13 NOTE — ED Notes (Signed)
Pt assisted to standing at bedside. Urine sample provided.

## 2016-02-13 NOTE — ED Notes (Signed)
Lab at bedside

## 2016-02-13 NOTE — ED Notes (Signed)
Pt wanted to stand to give urine specimen. Pt needed 2 person assist and was very unstable.

## 2016-02-14 ENCOUNTER — Inpatient Hospital Stay (HOSPITAL_COMMUNITY): Payer: Medicare Other

## 2016-02-14 ENCOUNTER — Encounter (HOSPITAL_COMMUNITY): Payer: Self-pay

## 2016-02-14 DIAGNOSIS — I10 Essential (primary) hypertension: Secondary | ICD-10-CM | POA: Diagnosis present

## 2016-02-14 DIAGNOSIS — E860 Dehydration: Secondary | ICD-10-CM

## 2016-02-14 DIAGNOSIS — I639 Cerebral infarction, unspecified: Secondary | ICD-10-CM | POA: Diagnosis present

## 2016-02-14 DIAGNOSIS — N182 Chronic kidney disease, stage 2 (mild): Secondary | ICD-10-CM

## 2016-02-14 LAB — COMPREHENSIVE METABOLIC PANEL
ALK PHOS: 69 U/L (ref 38–126)
ALT: 21 U/L (ref 17–63)
AST: 19 U/L (ref 15–41)
Albumin: 3.4 g/dL — ABNORMAL LOW (ref 3.5–5.0)
Anion gap: 9 (ref 5–15)
BILIRUBIN TOTAL: 0.9 mg/dL (ref 0.3–1.2)
BUN: 27 mg/dL — AB (ref 6–20)
CALCIUM: 8.3 mg/dL — AB (ref 8.9–10.3)
CHLORIDE: 101 mmol/L (ref 101–111)
CO2: 24 mmol/L (ref 22–32)
CREATININE: 1.4 mg/dL — AB (ref 0.61–1.24)
GFR calc Af Amer: >60 mL/min (ref >60)
GFR, EST NON AFRICAN AMERICAN: 56 mL/min — AB (ref >60)
Glucose, Bld: 166 mg/dL — ABNORMAL HIGH (ref 65–99)
Potassium: 3.8 mmol/L (ref 3.5–5.1)
Sodium: 134 mmol/L — ABNORMAL LOW (ref 135–145)
TOTAL PROTEIN: 6.7 g/dL (ref 6.5–8.1)

## 2016-02-14 LAB — GLUCOSE, CAPILLARY
GLUCOSE-CAPILLARY: 184 mg/dL — AB (ref 65–99)
GLUCOSE-CAPILLARY: 153 mg/dL — AB (ref 65–99)
GLUCOSE-CAPILLARY: 296 mg/dL — AB (ref 65–99)
GLUCOSE-CAPILLARY: 93 mg/dL (ref 65–99)
GLUCOSE-CAPILLARY: 112 mg/dL — AB (ref 65–99)
Glucose-Capillary: 129 mg/dL — ABNORMAL HIGH (ref 65–99)

## 2016-02-14 LAB — BLOOD GAS, ARTERIAL
Acid-Base Excess: 0.8 mmol/L (ref 0.0–2.0)
BICARBONATE: 23.8 meq/L (ref 20.0–24.0)
Drawn by: 213101
O2 Content: 2 L/min
O2 Saturation: 95.7 %
PCO2 ART: 38.8 mmHg (ref 35.0–45.0)
PH ART: 7.398 (ref 7.350–7.450)
PO2 ART: 82.1 mmHg (ref 80.0–100.0)
TCO2: 17.8 mmol/L (ref 0–100)

## 2016-02-14 LAB — LIPID PANEL
Cholesterol: 270 mg/dL — ABNORMAL HIGH (ref 0–200)
HDL: 32 mg/dL — ABNORMAL LOW (ref >40)
LDL Cholesterol: 160 mg/dL — ABNORMAL HIGH (ref 0–99)
Total CHOL/HDL Ratio: 8.4 RATIO
Triglycerides: 391 mg/dL — ABNORMAL HIGH (ref <150)
VLDL: 78 mg/dL — ABNORMAL HIGH (ref 0–40)

## 2016-02-14 MED ORDER — LOSARTAN POTASSIUM 50 MG PO TABS
25.0000 mg | ORAL_TABLET | Freq: Every day | ORAL | Status: DC
  Administered 2016-02-14 – 2016-02-16 (×3): 25 mg via ORAL
  Filled 2016-02-14 (×3): qty 1

## 2016-02-14 MED ORDER — GEMFIBROZIL 600 MG PO TABS
600.0000 mg | ORAL_TABLET | Freq: Two times a day (BID) | ORAL | Status: DC
  Administered 2016-02-14 – 2016-02-16 (×5): 600 mg via ORAL
  Filled 2016-02-14 (×5): qty 1

## 2016-02-14 MED ORDER — SODIUM CHLORIDE 0.9 % IV SOLN: INTRAVENOUS | Status: DC

## 2016-02-14 MED ORDER — SODIUM CHLORIDE 0.9 % IV SOLN
INTRAVENOUS | Status: DC
  Administered 2016-02-14 – 2016-02-16 (×3): via INTRAVENOUS

## 2016-02-14 MED ORDER — INSULIN GLARGINE 100 UNIT/ML ~~LOC~~ SOLN
100.0000 [IU] | Freq: Every day | SUBCUTANEOUS | Status: DC
  Administered 2016-02-14 – 2016-02-15 (×3): 100 [IU] via SUBCUTANEOUS
  Filled 2016-02-14 (×6): qty 1

## 2016-02-14 MED ORDER — ASPIRIN 325 MG PO TABS
325.0000 mg | ORAL_TABLET | Freq: Every day | ORAL | Status: DC
  Administered 2016-02-14 – 2016-02-16 (×3): 325 mg via ORAL
  Filled 2016-02-14 (×3): qty 1

## 2016-02-14 MED ORDER — LIVING WELL WITH DIABETES BOOK
Freq: Once | Status: AC
  Administered 2016-02-14: 18:00:00
  Filled 2016-02-14: qty 1

## 2016-02-14 MED ORDER — STROKE: EARLY STAGES OF RECOVERY BOOK
Freq: Once | Status: AC
  Administered 2016-02-14: 18:00:00
  Filled 2016-02-14: qty 1

## 2016-02-14 MED ORDER — ASPIRIN 300 MG RE SUPP: 300.0000 mg | Freq: Every day | RECTAL | Status: DC

## 2016-02-14 MED ORDER — AMOXICILLIN 250 MG PO CAPS
500.0000 mg | ORAL_CAPSULE | Freq: Three times a day (TID) | ORAL | Status: DC
  Administered 2016-02-14 – 2016-02-15 (×4): 500 mg via ORAL
  Filled 2016-02-14 (×4): qty 2

## 2016-02-14 MED ORDER — ATORVASTATIN CALCIUM 40 MG PO TABS
40.0000 mg | ORAL_TABLET | Freq: Every day | ORAL | Status: DC
  Administered 2016-02-14 – 2016-02-15 (×2): 40 mg via ORAL
  Filled 2016-02-14 (×2): qty 1

## 2016-02-14 MED ORDER — INSULIN ASPART 100 UNIT/ML ~~LOC~~ SOLN
0.0000 [IU] | Freq: Three times a day (TID) | SUBCUTANEOUS | Status: DC
  Administered 2016-02-14: 5 [IU] via SUBCUTANEOUS
  Administered 2016-02-14: 1 [IU] via SUBCUTANEOUS
  Administered 2016-02-16: 2 [IU] via SUBCUTANEOUS

## 2016-02-14 NOTE — Consult Note (Signed)
Oscar A. [name], MD     [email or web address]          Oscar Rose is an 55 y.o. male.   ASSESSMENT/PLAN: 1. Symptomatic right carotid high-grade stenosis presented with left-sided numbness, weakness and the left homonymous hemianopia. Risk factors are many including hypertension, diabetes, dyslipidemia, untreated sleep apnea syndrome and obesity.  2. Associated confusion and oral trauma/biting of the tongue quite worrisome for seizures.  RECOMMENDATION: The patient should have urgent carotid endarterectomy. He is currently reluctant and seemed to have indifference attitude to his medical well-being. However, recommend that the patient should have outpatient evaluation for endarterectomy or other revascularization procedures.  Continue with aspirin.  EEG.  Long-term risk factor reduction including diabetes control, weight loss, blood pressure control and better control of dyslipidemia. He also recommended that he retry season his CPAP machine.   Additional labs for the following: RPR, HIV, homocysteine level and vitamin B12.    The patient is a 55 year old white male who presents to the hospital with confusion and left-sided weakness. The patient was found on the ground confused. The patient has limited recollection as to why he was on the ground. He thinks that he may have fallen to the ground. He continues to have left upper extremity numbness. He does not report having any weakness. The patient reports that he has mild dysarthria which she thinks is coming from having bitten his tongue. He tells me that he has been his tongue a couple times recently you him before he had his fall and hospitalization a couple days ago. The patient reports that he is otherwise feeling well. He denies chest pain, shortness of breath, or GU symptoms. He does report having some heartburn. Review systems otherwise negative.       GENERAL: This is a pleasant morbidly obese  man in no acute distress.  HEENT: He has a large stocky neck. There is significant trauma involving the tip of the tongue from biting. Tongue size is large with circumferential crowding of the posterior space.  ABDOMEN: soft  EXTREMITIES: No edema   BACK: Normal.  SKIN: Normal by inspection.    MENTAL STATUS: He is awake and alert. He is oriented to year month and location. However, he states his age is 67. No evidence of dysarthria or aphasia. He follows commands well.  CRANIAL NERVES: Pupils are equal, round and reactive to light and accomodation; extra ocular movements are full, there is no significant nystagmus; visual fields are full; upper and lower facial muscles are normal in strength and symmetric, there is no flattening of the nasolabial folds; tongue is midline; uvula is midline; shoulder elevation is normal. There is a dense left homonymous hemianopia.  MOTOR: Normal tone, bulk and strength - except for hand muscles are slightly weak 4+/5; LUE pronator drift. There is a significant drift of the left upper extremity. There is no drift of the legs.  COORDINATION: Left finger to nose is normal, right finger to nose is normal, No rest tremor; no intention tremor; no postural tremor; no bradykinesia.  REFLEXES: Deep tendon reflexes are symmetrical and normal.   SENSATION: Normal to light touch and temperature. The patient extinguished to double simultaneous stimulation tactilely on the left side.      NIH stroke scale 1, 2, 1, 1     total 5.    Blood pressure 144/60, pulse 86, temperature 98.5 F (36.9 C), temperature source Oral, resp. rate 18, weight 300 lb (136.079 kg), SpO2 92 %.  Past Medical History  Diagnosis Date  . Hyperlipidemia   . Diabetic neuropathy ([place])   . GERD (gastroesophageal reflux disease)   . Arthritis   . Hypertension   . Depression        . Type 2 diabetes mellitus ([place])   . History of kidney stones   . Retained ureteral stent     w/  encrustation SINCE 2012  . 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 History of retinal detachment   . Rotator cuff syndrome of right shoulder   . Restless leg syndrome   . At risk for sleep apnea     STOP-BANG= 7    SENT TO PCP 06-29-2014  . CKD (chronic kidney disease), stage II     montitored by nephrologist    Past Surgical History  Procedure Laterality Date  . 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 Cardiovascular stress test  04-05-2014  dr croitoru    low risk lexiscan nuclear study with mild diaphragmatic attenuation artifact/  no ischemia/  ef 58%  . Tonsillectomy and adenoidectomy  as child  . Cataract extraction w/ intraocular lens  implant, bilateral  2013  . I & d  infected spider bite upper back  06/ 2012  . Retinal detachment surgery Left 2013  . Cysto /  left ureteral stent placement  2012  . Cystoscopy w/ ureteral stent removal Left 07/07/2014    Procedure: CYSTO WITH LEFT PORTION STENT REMOVAL;  Surgeon: Malka So, MD;  Location: [facility];  Service: Urology;  Laterality: Left;  . Cystoscopy with ureteroscopy and stent placement Left 07/07/2014    Procedure: CYSTOLITHALOPAXY URETEROSCOPY WITH STENT;  Surgeon: Malka So, MD;  Location: [facility];  Service: Urology;  Laterality: Left;  . Holmium laser application N/A 10/07/5972    Procedure: HOLMIUM LASER APPLICATION;  Surgeon: Malka So, MD;  Location: [facility];  Service: Urology;  Laterality: N/A;  . Nephrolithotomy Left 08/16/2014    Procedure: LEFT NEPHROLITHOTOMY PERCUTANEOUS;  Surgeon: Malka So, MD;  Location: [facility];  Service: Urology;  Laterality: Left;  . Nephrolithotomy Left 08/18/2014    Procedure: LEFT NEPHROLITHOTOMY PERCUTANEOUS SECOND LOOK;  Surgeon: Malka So, MD;  Location: [facility];  Service: Urology;  Laterality: Left;    Family History  Problem  Relation Age of Onset  . Cancer Mother     Throat    Social History:  reports that he has never smoked. He has never used smokeless tobacco. He reports that he drinks alcohol. He reports that he does not use illicit drugs.  Allergies: No Known Allergies  Medications: Prior to Admission medications   Medication Sig Start Date End Date Taking? Authorizing Provider  acetaminophen (TYLENOL) 500 MG tablet Take 1,000 mg by mouth every 6 (six) hours as needed for mild pain or moderate pain.   Yes Historical Provider, MD  amoxicillin (AMOXIL) 500 MG capsule Take 500 mg by mouth 3 (three) times daily.   Yes Historical Provider, MD  Choline Fenofibrate (TRILIPIX) 135 MG capsule Take 135 mg by mouth daily.   Yes Historical Provider, MD  glipiZIDE (GLUCOTROL XL) 10 MG 24 hr tablet Take 20 mg by mouth at bedtime.    Yes Historical Provider, MD  insulin glargine (LANTUS) 100 UNIT/ML injection Inject 100 Units into the skin at bedtime.  Yes Historical Provider, MD  losartan (COZAAR) 100 MG tablet Take 100 mg by mouth at bedtime. 05/19/15 05/18/16 Yes Historical Provider, MD  pravastatin (PRAVACHOL) 40 MG tablet Take 40 mg by mouth at bedtime.  09/05/14 02/13/16 Yes Historical Provider, MD    Scheduled Meds: . amoxicillin  500 mg Oral TID  . aspirin  300 mg Rectal Daily   Or  . aspirin  325 mg Oral Daily  . atorvastatin  40 mg Oral q1800  . gemfibrozil  600 mg Oral BID AC  . insulin aspart  0-9 Units Subcutaneous TID WC  . insulin glargine  100 Units Subcutaneous QHS  . losartan  25 mg Oral Daily   Continuous Infusions: . sodium chloride 75 mL/hr at 02/14/16 1735   PRN Meds:.     Results for orders placed or performed during the hospital encounter of 02/13/16 (from the past 48 hour(s))  CBG monitoring, ED     Status: Abnormal   Collection Time: 02/13/16  3:12 PM  Result Value Ref Range   Glucose-Capillary 230 (H) 65 - 99 mg/dL  CBC with Differential     Status: Abnormal   Collection  Time: 02/13/16  3:49 PM  Result Value Ref Range   WBC 12.0 (H) 4.0 - 10.5 K/uL   RBC 4.51 4.22 - 5.81 MIL/uL   Hemoglobin 13.3 13.0 - 17.0 g/dL   HCT 39.0 39.0 - 52.0 %   MCV 86.5 78.0 - 100.0 fL   MCH 29.5 26.0 - 34.0 pg   MCHC 34.1 30.0 - 36.0 g/dL   RDW 13.0 11.5 - 15.5 %   Platelets 215 150 - 400 K/uL   Neutrophils Relative % 80 %   Neutro Abs 9.6 (H) 1.7 - 7.7 K/uL   Lymphocytes Relative 12 %   Lymphs Abs 1.5 0.7 - 4.0 K/uL   Monocytes Relative 8 %   Monocytes Absolute 1.0 0.1 - 1.0 K/uL   Eosinophils Relative 0 %   Eosinophils Absolute 0.0 0.0 - 0.7 K/uL   Basophils Relative 0 %   Basophils Absolute 0.0 0.0 - 0.1 K/uL  Lipase, blood     Status: None   Collection Time: 02/13/16  3:49 PM  Result Value Ref Range   Lipase 13 11 - 51 U/L  Comprehensive metabolic panel     Status: Abnormal   Collection Time: 02/13/16  3:49 PM  Result Value Ref Range   Sodium 131 (L) 135 - 145 mmol/L   Potassium 3.9 3.5 - 5.1 mmol/L   Chloride 99 (L) 101 - 111 mmol/L   CO2 22 22 - 32 mmol/L   Glucose, Bld 227 (H) 65 - 99 mg/dL   BUN 25 (H) 6 - 20 mg/dL   Creatinine, Ser 1.47 (H) 0.61 - 1.24 mg/dL   Calcium 8.3 (L) 8.9 - 10.3 mg/dL   Total Protein 6.6 6.5 - 8.1 g/dL   Albumin 3.3 (L) 3.5 - 5.0 g/dL   AST 22 15 - 41 U/L   ALT 22 17 - 63 U/L   Alkaline Phosphatase 71 38 - 126 U/L   Total Bilirubin 1.0 0.3 - 1.2 mg/dL   GFR calc non Af Amer 53 (L) >60 mL/min   GFR calc Af Amer >60 >60 mL/min    Comment: (NOTE) The eGFR has been calculated using the CKD EPI equation. This calculation has not been validated in all clinical situations. eGFR's persistently <60 mL/min signify possible Chronic Kidney Disease.    Anion gap 10 5 -  15  Troponin I (q 6hr x 3)     Status: Abnormal   Collection Time: 02/13/16  4:06 PM  Result Value Ref Range   Troponin I 0.05 (HH) <0.03 ng/mL    Comment: CRITICAL RESULT CALLED TO, READ BACK BY AND VERIFIED WITH: HAYMORE R AT 2351 ON [id number] BY FORSYTH K     Urinalysis, Routine w reflex microscopic (not at [facility])     Status: Abnormal   Collection Time: 02/13/16 10:20 PM  Result Value Ref Range   Color, Urine YELLOW YELLOW   APPearance HAZY (A) CLEAR   Specific Gravity, Urine >1.030 (H) 1.005 - 1.030   pH 6.0 5.0 - 8.0   Glucose, UA >1000 (A) NEGATIVE mg/dL   Hgb urine dipstick LARGE (A) NEGATIVE   Bilirubin Urine SMALL (A) NEGATIVE   Ketones, ur TRACE (A) NEGATIVE mg/dL   Protein, ur >300 (A) NEGATIVE mg/dL   Nitrite NEGATIVE NEGATIVE   Leukocytes, UA NEGATIVE NEGATIVE  Urine rapid drug screen (hosp performed)     Status: None   Collection Time: 02/13/16 10:20 PM  Result Value Ref Range   Opiates NONE DETECTED NONE DETECTED   Cocaine NONE DETECTED NONE DETECTED   Benzodiazepines NONE DETECTED NONE DETECTED   Amphetamines NONE DETECTED NONE DETECTED   Tetrahydrocannabinol NONE DETECTED NONE DETECTED   Barbiturates NONE DETECTED NONE DETECTED    Comment:        DRUG SCREEN FOR MEDICAL PURPOSES ONLY.  IF CONFIRMATION IS NEEDED FOR ANY PURPOSE, NOTIFY LAB WITHIN 5 DAYS.        LOWEST DETECTABLE LIMITS FOR URINE DRUG SCREEN Drug Class       Cutoff (ng/mL) Amphetamine      1000 Barbiturate      200 Benzodiazepine   045 Tricyclics       409 Opiates          300 Cocaine          300 THC              50   Urine microscopic-add on     Status: Abnormal   Collection Time: 02/13/16 10:20 PM  Result Value Ref Range   Squamous Epithelial / LPF 0-5 (A) NONE SEEN   WBC, UA 0-5 0 - 5 WBC/hpf   RBC / HPF 0-5 0 - 5 RBC/hpf   Bacteria, UA FEW (A) NONE SEEN   Casts GRANULAR CAST (A) NEGATIVE    Comment: HYALINE CASTS   Crystals URIC ACID CRYSTALS (A) NEGATIVE  Blood gas, arterial     Status: None   Collection Time: 02/14/16 12:02 AM  Result Value Ref Range   O2 Content 2.0 L/min   Delivery systems NASAL CANNULA    pH, Arterial 7.398 7.350 - 7.450   pCO2 arterial 38.8 35.0 - 45.0 mmHg   pO2, Arterial 82.1 80.0 - 100.0 mmHg    Bicarbonate 23.8 20.0 - 24.0 mEq/L   TCO2 17.8 0 - 100 mmol/L   Acid-Base Excess 0.8 0.0 - 2.0 mmol/L   O2 Saturation 95.7 %   Collection site RIGHT RADIAL    Drawn by [id number]    Sample type ARTERIAL DRAW    Allens test (pass/fail) PASS PASS  Glucose, capillary     Status: Abnormal   Collection Time: 02/14/16  2:25 AM  Result Value Ref Range   Glucose-Capillary 184 (H) 65 - 99 mg/dL  Lipid panel     Status: Abnormal   Collection Time: 02/14/16  5:54 AM  Result Value Ref Range   Cholesterol 270 (H) 0 - 200 mg/dL   Triglycerides 391 (H) <150 mg/dL   HDL 32 (L) >40 mg/dL   Total CHOL/HDL Ratio 8.4 RATIO   VLDL 78 (H) 0 - 40 mg/dL   LDL Cholesterol 160 (H) 0 - 99 mg/dL    Comment:        Total Cholesterol/HDL:CHD Risk Coronary Heart Disease Risk Table                     Men   Women  1/2 Average Risk   3.4   3.3  Average Risk       5.0   4.4  2 X Average Risk   9.6   7.1  3 X Average Risk  23.4   11.0        Use the calculated Patient Ratio above and the CHD Risk Table to determine the patient's CHD Risk.        ATP III CLASSIFICATION (LDL):  <100     mg/dL   Optimal  100-129  mg/dL   Near or Above                    Optimal  130-159  mg/dL   Borderline  160-189  mg/dL   High  >190     mg/dL   Very High   Comprehensive metabolic panel     Status: Abnormal   Collection Time: 02/14/16  5:54 AM  Result Value Ref Range   Sodium 134 (L) 135 - 145 mmol/L   Potassium 3.8 3.5 - 5.1 mmol/L   Chloride 101 101 - 111 mmol/L   CO2 24 22 - 32 mmol/L   Glucose, Bld 166 (H) 65 - 99 mg/dL   BUN 27 (H) 6 - 20 mg/dL   Creatinine, Ser 1.40 (H) 0.61 - 1.24 mg/dL   Calcium 8.3 (L) 8.9 - 10.3 mg/dL   Total Protein 6.7 6.5 - 8.1 g/dL   Albumin 3.4 (L) 3.5 - 5.0 g/dL   AST 19 15 - 41 U/L   ALT 21 17 - 63 U/L   Alkaline Phosphatase 69 38 - 126 U/L   Total Bilirubin 0.9 0.3 - 1.2 mg/dL   GFR calc non Af Amer 56 (L) >60 mL/min   GFR calc Af Amer >60 >60 mL/min    Comment: (NOTE) The eGFR  has been calculated using the CKD EPI equation. This calculation has not been validated in all clinical situations. eGFR's persistently <60 mL/min signify possible Chronic Kidney Disease.    Anion gap 9 5 - 15  Glucose, capillary     Status: Abnormal   Collection Time: 02/14/16  7:50 AM  Result Value Ref Range   Glucose-Capillary 153 (H) 65 - 99 mg/dL  Glucose, capillary     Status: Abnormal   Collection Time: 02/14/16  7:57 AM  Result Value Ref Range   Glucose-Capillary 296 (H) 65 - 99 mg/dL  Glucose, capillary     Status: None   Collection Time: 02/14/16 11:20 AM  Result Value Ref Range   Glucose-Capillary 93 65 - 99 mg/dL   Comment 1 Notify RN    Comment 2 Document in Chart   Glucose, capillary     Status: Abnormal   Collection Time: 02/14/16  4:05 PM  Result Value Ref Range   Glucose-Capillary 129 (H) 65 - 99 mg/dL    Studies/Results:   BRAIN MRI/MRA 1. 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 No evidence of intracranial hemorrhage. 2. No large vessel occlusion, high-grade stenosis, or aneurysm of the circle of Willis is identified.     CAROTID DOPPLERS 1. Bilateral carotid bifurcation and proximal ICA plaque, resulting in 70-99% diameter right ICA stenosis, less than 50% diameter left ICA stenosis. Consider vascular surgical or neurointerventional radiology consultation. 2. Antegrade bilateral vertebral arterial flow.    Paije Goodhart A. Merlene Rose, M.D.  Diplomate, Tax adviser of Psychiatry and Neurology ( Neurology). 02/14/2016, 6:36 PM

## 2016-02-14 NOTE — Plan of Care (Signed)
Problem: Food- and Nutrition-Related Knowledge Deficit (NB-1.1) Goal: Nutrition education Formal process to instruct or train a patient/client in a skill or to impart knowledge to help patients/clients voluntarily manage or modify food choices and eating behavior to maintain or improve health. Outcome: Adequate for Discharge  RD consulted for nutrition education regarding diabetes. His presents following a fall. His home diet is regular and patient says he doesn't check his blood glucose consistently.   No results found for: HGBA1C  RD provided "Carbohydrate Counting, Label Reading, Chemical engineer and [employer]" handouts. Also provided an extensive list of "apps" that are diabetes or nutriiton related.  Discussed importance of controlled and consistent carbohydrate intake throughout the day. He usually never eats breakfast and main meal is at night.  Provided examples of ways to balance meals/snacks and encouraged intake of high-fiber, whole grain complex carbohydrates. Teach back method used.  Expect poor compliance. Pt doesn't make eye contact during the entire visit he is looking away toward the wall and reluctantly answers diet hx questions.   Body mass index is 43.05 kg/(m^2). Pt meets criteria for obesity class IIbased on current BMI.  Current diet order is cho modified, patient is consuming approximately >75% of meals at this time.   Labs and medications reviewed.   Recent Labs Lab 02/13/16 1549 02/14/16 0554  NA 131* 134*  K 3.9 3.8  CL 99* 101  CO2 22 24  BUN 25* 27*  CREATININE 1.47* 1.40*  CALCIUM 8.3* 8.3*  GLUCOSE 227* 166*     No further nutrition interventions warranted at this time. RD contact information provided. If additional nutrition issues arise, please re-consult RD.  Colman Cater MS,RD,CSG,LDN Office: [phone] Pager: [phone]

## 2016-02-14 NOTE — H&P (Signed)
History and Physical    Oscar Rose [id number] DOB: Apr 02, 1961 DOA: 02/13/2016  PCP: Sherrie Mustache, MD  Patient coming from: home  Chief Complaint: fall, left arm numb and tingling  HPI: Oscar Rose is a 55 y.o. male with medical history significant of DM, CKD, depression comes in after falling today, his friends checked on him and insisted he come to the ED and they called 911.  Pt says he was diagnosed with a sinus in the last couple of days.  He has been having a bad headache for over 2 days associated with some numbness, tingling and weakness of his left arm.  He says his arm weakness has gotten better however.  He denies any weakness, numbness or tingling in his legs but he does have diabetic neuropathy in his feet chronically and this is why he fell today per his report.  Denies any fevers.  Just hasnt been feeling "right".  Some nausea.  Ct head shows a questionable infarct, pt referred for admission for possible stroke.  Review of Systems: As per HPI otherwise 10 point review of systems negative.   Past Medical History  Diagnosis Date  . Hyperlipidemia   . Diabetic neuropathy ([place])   . GERD (gastroesophageal reflux disease)   . Arthritis   . Hypertension   . Depression        . Type 2 diabetes mellitus ([place])   . History of kidney stones   . Retained ureteral stent     w/ encrustation SINCE 2012  . 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 History of retinal detachment   . Rotator cuff syndrome of right shoulder   . Restless leg syndrome   . At risk for sleep apnea     STOP-BANG= 7    SENT TO PCP 06-29-2014  . CKD (chronic kidney disease), stage II     montitored by nephrologist    Past Surgical History  Procedure Laterality Date  . 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 Cardiovascular stress test  04-05-2014  dr croitoru    low risk lexiscan nuclear study with  mild diaphragmatic attenuation artifact/  no ischemia/  ef 58%  . Tonsillectomy and adenoidectomy  as child  . Cataract extraction w/ intraocular lens  implant, bilateral  2013  . I & d  infected spider bite upper back  06/ 2012  . Retinal detachment surgery Left 2013  . Cysto /  left ureteral stent placement  2012  . Cystoscopy w/ ureteral stent removal Left 07/07/2014    Procedure: CYSTO WITH LEFT PORTION STENT REMOVAL;  Surgeon: Malka So, MD;  [facility];  Service: Urology;  Laterality: Left;  . Cystoscopy with ureteroscopy and stent placement Left 07/07/2014    Procedure: CYSTOLITHALOPAXY URETEROSCOPY WITH STENT;  Surgeon: Malka So, MD;  Location[facility];  Service: Urology;  Laterality: Left;  . Holmium laser application N/A [id number]    Procedure: HOLMIUM LASER APPLICATION;  Surgeon: Malka So, MD;  Location: [facility];  Service: Urology;  Laterality: N/A;  . Nephrolithotomy Left 08/16/2014    Procedure: LEFT NEPHROLITHOTOMY PERCUTANEOUS;  Surgeon: Malka So, MD;  Location: [facility];  Service: Urology;  Laterality: Left;  . Nephrolithotomy Left 08/18/2014    Procedure: LEFT NEPHROLITHOTOMY PERCUTANEOUS SECOND LOOK;  Surgeon: Malka So,  MD;  Location: [facility];  Service: Urology;  Laterality: Left;     reports that he has never smoked. He has never used smokeless tobacco. He reports that he drinks alcohol. He reports that he does not use illicit drugs.  No Known Allergies  Family History  Problem Relation Age of Onset  . Cancer Mother     Throat    Prior to Admission medications   Medication Sig Start Date End Date Taking? Authorizing Provider  acetaminophen (TYLENOL) 500 MG tablet Take 1,000 mg by mouth every 6 (six) hours as needed for mild pain or moderate pain.   Yes Historical Provider, MD  amoxicillin (AMOXIL) 500 MG capsule Take 500 mg by mouth 3 (three) times daily.   Yes Historical Provider, MD    Choline Fenofibrate (TRILIPIX) 135 MG capsule Take 135 mg by mouth daily.   Yes Historical Provider, MD  glipiZIDE (GLUCOTROL XL) 10 MG 24 hr tablet Take 20 mg by mouth at bedtime.    Yes Historical Provider, MD  insulin glargine (LANTUS) 100 UNIT/ML injection Inject 100 Units into the skin at bedtime.    Yes Historical Provider, MD  losartan (COZAAR) 100 MG tablet Take 100 mg by mouth at bedtime. 05/19/15 05/18/16 Yes Historical Provider, MD  pravastatin (PRAVACHOL) 40 MG tablet Take 40 mg by mouth at bedtime.  09/05/14 02/13/16 Yes Historical Provider, MD    Physical Exam: Filed Vitals:   02/13/16 2030 02/13/16 2130 02/13/16 2230 02/13/16 2231  BP: 125/75 123/69 113/59 113/59  Pulse: 93 95 95 96  Resp: 13 20 20 20   SpO2: 97% 92% 90% 96%      Constitutional: NAD, calm, comfortable Filed Vitals:   02/13/16 2030 02/13/16 2130 02/13/16 2230 02/13/16 2231  BP: 125/75 123/69 113/59 113/59  Pulse: 93 95 95 96  Resp: 13 20 20 20   SpO2: 97% 92% 90% 96%   Eyes: PERRL, lids and conjunctivae normal ENMT: Mucous membranes are moist. Posterior pharynx clear of any exudate or lesions.Normal dentition.  Neck: normal, supple, no masses, no thyromegaly Respiratory: clear to auscultation bilaterally, no wheezing, no crackles. Normal respiratory effort. No accessory muscle use.  Cardiovascular: Regular rate and rhythm, no murmurs / rubs / gallops. No extremity edema. 2+ pedal pulses. No carotid bruits.  Abdomen: no tenderness, no masses palpated. No hepatosplenomegaly. Bowel sounds positive.  Musculoskeletal: no clubbing / cyanosis. No joint deformity upper and lower extremities. Good ROM, no contractures. Normal muscle tone.  Skin: no rashes, lesions, ulcers. No induration Neurologic: CN 2-12 grossly intact. Sensation intact, DTR normal. Strength 5/5 in all 4.  Psychiatric: Normal judgment and insight. Alert and oriented x 3. Normal mood.    Labs on Admission: I have personally reviewed following  labs and imaging studies  CBC:  Recent Labs Lab 02/13/16 1549  WBC 12.0*  NEUTROABS 9.6*  HGB 13.3  HCT 39.0  MCV 86.5  PLT 123456   Basic Metabolic Panel:  Recent Labs Lab 02/13/16 1549  NA 131*  K 3.9  CL 99*  CO2 22  GLUCOSE 227*  BUN 25*  CREATININE 1.47*  CALCIUM 8.3*   GFR: CrCl cannot be calculated (Unknown ideal weight.). Liver Function Tests:  Recent Labs Lab 02/13/16 1549  AST 22  ALT 22  ALKPHOS 71  BILITOT 1.0  PROT 6.6  ALBUMIN 3.3*    Recent Labs Lab 02/13/16 1549  LIPASE 13   No results for input(s): AMMONIA in the last 168 hours. Coagulation Profile: No results for input(s):  INR, PROTIME in the last 168 hours. Cardiac Enzymes:  Recent Labs Lab 02/13/16 1606  TROPONINI 0.05*   BNP (last 3 results) No results for input(s): PROBNP in the last 8760 hours. HbA1C: No results for input(s): HGBA1C in the last 72 hours. CBG:  Recent Labs Lab 02/13/16 1512  GLUCAP 230*   Lipid Profile: No results for input(s): CHOL, HDL, LDLCALC, TRIG, CHOLHDL, LDLDIRECT in the last 72 hours. Thyroid Function Tests: No results for input(s): TSH, T4TOTAL, FREET4, T3FREE, THYROIDAB in the last 72 hours. Anemia Panel: No results for input(s): VITAMINB12, FOLATE, FERRITIN, TIBC, IRON, RETICCTPCT in the last 72 hours. Urine analysis:    Component Value Date/Time   COLORURINE YELLOW 02/13/2016 2220   APPEARANCEUR HAZY* 02/13/2016 2220   LABSPEC >1.030* 02/13/2016 2220   PHURINE 6.0 02/13/2016 2220   GLUCOSEU >1000* 02/13/2016 2220   HGBUR LARGE* 02/13/2016 2220   BILIRUBINUR SMALL* 02/13/2016 2220   KETONESUR TRACE* 02/13/2016 2220   PROTEINUR >300* 02/13/2016 2220   UROBILINOGEN 0.2 05/31/2015 0752   NITRITE NEGATIVE 02/13/2016 2220   LEUKOCYTESUR NEGATIVE 02/13/2016 2220   Sepsis Labs: !!!!!!!!!!!!!!!!!!!!!!!!!!!!!!!!!!!!!!!!!!!! @LABRCNTIP (procalcitonin:4,lacticidven:4) )No results found for this or any previous visit (from the past 240  hour(s)).   Radiological Exams on Admission: Ct Head Wo Contrast  02/13/2016  CLINICAL DATA:  Fall yesterday, head pain EXAM: CT HEAD WITHOUT CONTRAST TECHNIQUE: Contiguous axial images were obtained from the base of the skull through the vertex without intravenous contrast. COMPARISON:  None. FINDINGS: No skull fracture is noted. Paranasal sinuses and mastoid air cells are unremarkable. No intracranial hemorrhage, mass effect or midline shift. Axial image twenty-five and 26 there is subtle area of cortical decreased attenuation in right frontal lobe. Subacute or evolving infarct cannot be excluded. Clinical correlation is necessary. Further correlation with MRI with diffusion imaging is recommended as clinically warranted. No mass lesion is noted on this unenhanced scan. No hydrocephalus. No intraventricular hemorrhage. There is focal decreased attenuation in right caudate nucleus measures about 5 mm. Also right temporal lobe axial image 14 This may represent infarct of indeterminate age. IMPRESSION: No intracranial hemorrhage, mass effect or midline shift. Axial image twenty-five and 26 there is subtle area of cortical decreased attenuation in right frontal lobe. Subacute or evolving infarct cannot be excluded. Further correlation with MRI is recommended as clinically warranted. There is focal decreased attenuation in right caudate nucleus measures about 5 mm. Also right temporal lobe axial image 14. This may represent infarct of indeterminate age. These results were called by telephone at the time of interpretation on 02/13/2016 at 7:36 pm to Dr. Nat Christen , who verbally acknowledged these results. Electronically Signed   By: Lahoma Crocker M.D.   On: 02/13/2016 19:38   Dg Chest Port 1 View  02/13/2016  CLINICAL DATA:  Weakness, hypertension, diabetes mellitus, GERD EXAM: PORTABLE CHEST 1 VIEW COMPARISON:  Portable exam 1611 hours compared 08/16/2014 FINDINGS: Lordotic positioning. Low lung volumes. Upper  normal heart size. Mediastinal contours and pulmonary vascularity normal. No gross infiltrate, pleural effusion or pneumothorax. IMPRESSION: Low lung volumes without definite infiltrate. Electronically Signed   By: Lavonia Dana M.D.   On: 02/13/2016 16:24    EKG: Independently reviewed. Nsr, RAD  Assessment/Plan 55 yo male with left arm weakness,/n/t with headache and abnormal ct head concerning for stroke  Principal Problem:   Cerebral infarction due to unspecified mechanism- full cva work up.  Check mri brain in am along with carotid dopplers and cardiac echocardiogram.  cva pathway,  aspirin.  Check flp and hga1c.  Active Problems:   Dehydration-  ivf overnight   Fall at home- noted, no acute injuries   Hyponatremia- noted, ivf   Encephalopathy- this seems to have resolved, was noted by dr cook earlier in the ED   Essential hypertension- noted, stable     DVT prophylaxis: scds Code Status: full  Rajon Bisig A MD [facility]  If 7PM-7AM, please contact night-coverage [email or web address] Password [email or web address]  02/14/2016, 1:47 AM

## 2016-02-14 NOTE — Progress Notes (Signed)
Patient seen and examined  55 y.o. male with medical history significant of DM, CKD, depression comes in after falling today, his friends checked on him and insisted he come to the ED and they called 911. Pt says he was diagnosed with a sinus in the last couple of days. He has been having a bad headache for over 2 days associated with some numbness, tingling and weakness of his left arm. He says his arm weakness has gotten better however. He denies any weakness, numbness or tingling in his legs but he does have diabetic neuropathy in his feet chronically and this is why he fell today per his report. Denies any fevers. Just hasnt been feeling "right". Some nausea. Ct head shows a questionable infarct, pt referred for admission for possible stroke in the right frontal lobe. MRI pending  Assessment and plan Probable acute CVA MRI/MRA of the brain pending, carotid Doppler, 2-D echo pending PT OT speech Triglycerides 391, LDL 160 Hemoglobin A1c pending Consult neurology    Hyperlipidemia-will start patient on Lopid and continue statin, patient takes pravastatin at home   Hypertension-continue Cozaar  Depression-stable  Diabetes mellitus type 2-hemoglobin A1c pending, continue glipizide and Lantus, start sliding scale insulin  Chronic kidney disease stage II-baseline creatinine 1.5, creatinine at baseline

## 2016-02-14 NOTE — Evaluation (Addendum)
Occupational Therapy Evaluation Patient Details Name: JAIVIAN GUILLOT MRN: [id number] DOB: [birth year] Today's Date: 02/14/2016    History of Present Illness DESHUN URLACHER is a 55 y.o. male with medical history significant of DM, CKD, depression comes in after falling today, his friends checked on him and insisted he come to the ED and they called 911. Pt says he was diagnosed with a sinus in the last couple of days. He has been having a bad headache for over 2 days associated with some numbness, tingling and weakness of his left arm. He says his arm weakness has gotten better however. He denies any weakness, numbness or tingling in his legs but he does have diabetic neuropathy in his feet chronically and this is why he fell today per his report. Denies any fevers. Just hasnt been feeling "right". Some nausea. Ct head shows a questionable infarct, pt referred for admission for possible stroke.   Clinical Impression   Pt awake, alert, oriented to person, place, time, and situation. Pt reports he feels he does not need to be in the hospital. Pt also reports no weakness in BUE or BLE, however on examination pt has LUE weakness as well as decreased coordination in LUE. Pt able to perform ADL tasks while seated at EOB with increased time due to strength and coordination deficits in LUE, as well as moderate verbal cuing for performing tasks. Unable to test transfers or functional mobility during initial evaluation due to time constraint, will further assess pt task completion while standing at a later time. Pt demonstrates impulsivity during evaluation, requires verbal cuing for task completion and safety.  Recommend SLP evaluation to further evaluate cognitive deficits.  Pt will benefit from continued acute OT to further assess functioning during ADL and functional mobility tasks, as well as increasing strength, coordination, and safety during functional task completion.   Addendum: Pt is unsafe to  return home as home support is unknown, however per chart review pt appears to have little to no family support. Discussed pt with PT, feel pt would benefit from CIR or SNF setting to improve functional deficits in ADL completion while increasing safety and independence in ADL tasks.      Follow Up Recommendations  CIR/SNF   Equipment Recommendations  None recommended by OT       Precautions / Restrictions Precautions Precautions: Fall Restrictions Weight Bearing Restrictions: No      Mobility Bed Mobility Overal bed mobility: Supervision             General bed mobility comments: Pt able to complete supine-sit, scooting, sit-supine, and scooting up with supervision for safety due to impulsivity  Transfers                 General transfer comment: not tested due to time constraints         ADL Overall ADL's : Needs assistance/impaired Eating/Feeding: Set-up                    Lower Body Dressing: Supervision; Sitting/lateral leans Lower Body Dressing Details (indicate cue type and reason): Increased time required due to decreased coordination with left hand, verbal cuing for safety     Toileting- Clothing Manipulation and Hygiene: Min assist;Bed level Toileting - Clothing Manipulation Details (indicate cue type and reason): Pt able to doff gown neck and sleeves independently, requires assistance for donning gown due to decreased coordination       General ADL Comments: Unable to test functional mobility  during initial evaluation, will continue to assess     Vision Vision Assessment?: Yes Eye Alignment: Within Functional Limits Ocular Range of Motion: Within Functional Limits Alignment/Gaze Preference: Within Defined Limits Tracking/Visual Pursuits: Able to track stimulus in all quads without difficulty Saccades: Within functional limits Convergence: Within functional limits Visual Fields: Left visual field deficit (blind in left eye)           Pertinent Vitals/Pain Pain Assessment: No/denies pain     Hand Dominance Right   Extremity/Trunk Assessment Upper Extremity Assessment Upper Extremity Assessment: LUE deficits/detail LUE Deficits / Details: LUE ROM is WFL, shoulder strength 3/5, elbow 3+/5, grip strength fair.  LUE Coordination: decreased fine motor   Lower Extremity Assessment Lower Extremity Assessment: Defer to PT evaluation       Communication Communication Communication: No difficulties   Cognition Arousal/Alertness: Awake/alert Behavior During Therapy: WFL for tasks assessed/performed Overall Cognitive Status: Impaired/different from baseline  Areas of impairment: Safety/judgement; awareness Safety/judgement: decreased awareness of safety; decreased knowledge of deficits                                 Home Living Family/patient expects to be discharged to:: Private residence Living Arrangements: Other (Comment) (roommate) Available Help at Discharge: Friend(s);Available PRN/intermittently Type of Home: House Home Access: Stairs to enter           Bathroom Shower/Tub: Teacher, early years/pre: Standard     Home Equipment: Civil engineer, contracting - built in          Prior Functioning/Environment Level of Independence: Independent        Comments: Pt reports he was independent in all ADL tasks, no DME used for functional mobility, pt does not drive    OT Diagnosis: Hemiplegia non-dominant side   OT Problem List: Decreased strength;Decreased activity tolerance;Impaired balance (sitting and/or standing);Decreased coordination;Decreased safety awareness;Decreased knowledge of use of DME or AE;Impaired UE functional use   OT Treatment/Interventions: Self-care/ADL training;Therapeutic exercise;DME and/or AE instruction;Therapeutic activities;Patient/family education    OT Goals(Current goals can be found in the care plan section) Acute Rehab OT Goals Patient Stated Goal: To go  home OT Goal Formulation: With patient Time For Goal Achievement: 02/28/16 Potential to Achieve Goals: Good  OT Frequency: Min 2X/week    End of Session    Activity Tolerance: Patient tolerated treatment well Patient left: in bed;with call bell/phone within reach;with bed alarm set   Time: [id number] OT Time Calculation (min): 27 min Charges:  OT General Charges $OT Visit: 1 Procedure OT Evaluation $OT Eval Low Complexity: 1 Procedure  Guadelupe Sabin, OTR/L  [phone]  02/14/2016, 11:45 AM

## 2016-02-14 NOTE — Progress Notes (Addendum)
Inpatient Diabetes Program Recommendations  AACE/ADA: New Consensus Statement on Inpatient Glycemic Control (2015)  Target Ranges:  Prepandial:   less than 140 mg/dL      Peak postprandial:   less than 180 mg/dL (1-2 hours)      Critically ill patients:  140 - 180 mg/dL   Lab Results  Component Value Date   GLUCAP 296* 02/14/2016    Review of Glycemic Control  Diabetes history: type 2  Outpatient Diabetes medications: Lantus 100 units and glipizide 20 mg at bedtime.  Current orders for Inpatient glycemic control:  Lantus 100 units and sensitive correction tidwc.  Inpatient Diabetes Program Recommendations:    Consult received for DM education. Noted admission for potential stroke. Dietician visited and taught carbohydrate counting-states ready for discharge  A!C at 7.0%. Have ordered all teaching materials-manual for 'Living with DM", videos per netword education, and RD consult has been completed.  Thank you Rosita Kea, RN, MSN, CDE  Diabetes Inpatient Program Office: [phone] Pager: [phone] 8:00 am to 5:00 pm

## 2016-02-14 NOTE — Evaluation (Signed)
Physical Therapy Evaluation Patient Details Name: Oscar Rose MRN: [id number] DOB: Dec 13, 1960 Today's Date: 02/14/2016   History of Present Illness  55 yo M admitted 02/13/2016 s/p fall recent sinus infection with bad HA, numbness/tingling and weakness of his L UE.  Head CT with questionable infarct.   MRI Several areas of acute/early subacute infarction within the right MCA distribution as described corresponding to areas of hypoattenuation on the prior CT of the head. No evidence of intracranial hemorrhage.  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  Uncertain if PLOF information obtained is completely accurate, however pt states he normally does not use any DME for ambulation, and he is independent with all ADL's.  During PT evaluation today he was found to be saturated in a pool of urine, which the aide states has happened multiple times today. Encouraged pt to call for assistance when he needs to void.  Pt demonstrated supine<>sit with supervision due to impulsivity, and attempting to get out of bed on the opposite side with the rail still up.  Pt was assisting with hygiene and gown change.  Sit<>stand required Min A and multiple vc's for safety with hand placement.  Pt has great difficulty placing L hand on the RW, and needs hand over hand assistance to do so.  Min A for SPT from the bed<>chair with RW.  Pt then ambulated 92ft with RW and min/Mod A due to L knee buckling, as well as veering to the left.  Pt needed constant cues for safety during gait.  Pt also noted to have L visual field cut, however, unsure if this is due to his previous retinal detachment.   This patient demonstrates significant deficits in balance, safety, strength and  endurance, and would greatly benefit from continued skilled PT.  Recommend pt d/c to inpatient rehab to progress pt with the previously mentioned deficits.  If he does not qualify for CIR, he will need SNF.   Also recommend a cognitive assessment from SLP due to noted safety awareness deficits.       Follow Up Recommendations CIR    Equipment Recommendations  None recommended by PT    Recommendations for Other Services Speech consult (Cognitive)     Precautions / Restrictions Precautions Precautions: Fall Restrictions Weight Bearing Restrictions: No      Mobility  Bed Mobility Overal bed mobility: Needs Assistance Bed Mobility: Supine to Sit     Supine to sit: Supervision     General bed mobility comments: due to impulsivity.  Pt initially attempted to get out of bed on the right side with bed rail still up when therapist was preparing for him to get OOB on the left.   Transfers Overall transfer level: Needs assistance Equipment used: Rolling walker (2 wheeled) Transfers: Sit to/from Stand Sit to Stand: Min assist            Ambulation/Gait Ambulation/Gait assistance: Min assist;Mod assist Ambulation Distance (Feet): 40 Feet Assistive device: Rolling walker (2 wheeled) Gait Pattern/deviations: Step-to pattern;Staggering left     General Gait Details: Pt demonstrated 1 significant episode of L knee buckling, and requires extensive verbal and tactile cues due to veering left.  Pt required strong correction to avoid entering another pt's room. Pt is very  impulsive, and will change gait speed quickly.    Stairs            Wheelchair Mobility    Modified Rankin (Stroke Patients Only)       Balance Overall balance assessment: Needs assistance Sitting-balance support: Bilateral upper extremity supported Sitting balance-Leahy Scale: Good     Standing balance support: Bilateral upper extremity supported Standing balance-Leahy Scale: Fair                                Pertinent Vitals/Pain      Home Living   Living Arrangements: Other (Comment) (roommate) Available Help at Discharge:  (April - girlfriend's sister takes him shopping every week. ) Type of Home: House Home Access: Stairs to enter Entrance Stairs-Rails: Can reach both Entrance Stairs-Number of Steps: 4 Home Layout: One level Home Equipment: Shower seat - built in      Prior Function     Gait / Transfers Assistance Needed: independent with ambulation  ADL's / Homemaking Assistance Needed: independent with ADL's.   Comments: Pt reports he was independent in all ADL tasks, no DME used for functional mobility, pt does not drive     Hand Dominance   Dominant Hand: Right    Extremity/Trunk Assessment   Upper Extremity Assessment: Defer to OT evaluation           Lower Extremity Assessment: Generalized weakness         Communication   Communication: No difficulties  Pt expressed "I know it has to be Thursday, because I always have things to do on Tuesdays and Thursdays."  Apparently these are the days he plays [employer].  Pt informed that it was in fact Wednesday, not Thursday.     Cognition Arousal/Alertness: Awake/alert Behavior During Therapy: WFL for tasks assessed/performed Overall Cognitive Status: Within Functional Limits for tasks assessed                      General Comments General comments (skin integrity, edema, etc.): Pt has a wound on the right 2nd toe that appears black in color, B feet with moderate amount of dry sloughing skin.      Exercises        Assessment/Plan    PT Assessment Patient needs continued PT services  PT Diagnosis Difficulty walking;Abnormality of gait;Generalized weakness;Altered mental status   PT Problem List Decreased strength;Decreased activity tolerance;Decreased balance;Decreased mobility;Decreased coordination;Decreased cognition;Decreased knowledge of use of DME;Decreased  safety awareness;Decreased knowledge of precautions;Obesity  PT Treatment Interventions DME instruction;Gait training;Stair training;Functional mobility training;Therapeutic activities;Therapeutic exercise;Balance training;Cognitive remediation;Patient/family education   PT Goals (Current goals can be found in the Care Plan section) Acute Rehab PT Goals Patient Stated Goal: To go home PT Goal Formulation: With patient Time For Goal Achievement: 02/21/16 Potential to Achieve Goals: Fair    Frequency 7X/week   Barriers to discharge Decreased caregiver support      Co-evaluation               End of Session Equipment Utilized During Treatment: Gait belt Activity Tolerance: Patient tolerated treatment well Patient left: in bed;with bed alarm set Nurse Communication: Mobility status    Functional Assessment Tool Used: [employer] "6-clicks"  Functional Limitation: Mobility: Walking and moving around Mobility: Walking and Moving Around Current Status [phone]): At least 20 percent but less than 40 percent impaired, limited or restricted Mobility: Walking and Moving Around Goal  Status [phone]): At least 1 percent but less than 20 percent impaired, limited or restricted    Time: 1450-1530 PT Time Calculation (min) (ACUTE ONLY): 40 min   Charges:   PT Evaluation $PT Eval Moderate Complexity: 1 Procedure PT Treatments $Gait Training: 8-22 mins $Therapeutic Activity: 8-22 mins   PT G Codes:   PT G-Codes **NOT FOR INPATIENT CLASS** Functional Assessment Tool Used: [employer] "6-clicks"  Functional Limitation: Mobility: Walking and moving around Mobility: Walking and Moving Around Current Status [phone]): At least 20 percent but less than 40 percent impaired, limited or restricted Mobility: Walking and Moving Around Goal Status [phone]): At least 1 percent but less than 20 percent impaired, limited or restricted    Beth Elexis Pollak, PT, DPT X: [phone]

## 2016-02-15 ENCOUNTER — Inpatient Hospital Stay (HOSPITAL_COMMUNITY)
Admit: 2016-02-15 | Discharge: 2016-02-15 | Disposition: A | Discharge status: Home / Self Care | Payer: Medicare Other | Attending: Neurology | Admitting: Neurology

## 2016-02-15 ENCOUNTER — Inpatient Hospital Stay (HOSPITAL_COMMUNITY): Payer: Medicare Other

## 2016-02-15 DIAGNOSIS — I10 Essential (primary) hypertension: Secondary | ICD-10-CM

## 2016-02-15 DIAGNOSIS — I639 Cerebral infarction, unspecified: Principal | ICD-10-CM

## 2016-02-15 DIAGNOSIS — G459 Transient cerebral ischemic attack, unspecified: Secondary | ICD-10-CM

## 2016-02-15 LAB — CBC
HEMATOCRIT: 41 % (ref 39.0–52.0)
HEMOGLOBIN: 13.9 g/dL (ref 13.0–17.0)
MCH: 29.7 pg (ref 26.0–34.0)
MCHC: 33.9 g/dL (ref 30.0–36.0)
MCV: 87.6 fL (ref 78.0–100.0)
Platelets: 214 10*3/uL (ref 150–400)
RBC: 4.68 MIL/uL (ref 4.22–5.81)
RDW: 13 % (ref 11.5–15.5)
WBC: 8.4 10*3/uL (ref 4.0–10.5)

## 2016-02-15 LAB — GLUCOSE, CAPILLARY
GLUCOSE-CAPILLARY: 92 mg/dL (ref 65–99)
Glucose-Capillary: 103 mg/dL — ABNORMAL HIGH (ref 65–99)
Glucose-Capillary: 98 mg/dL (ref 65–99)
Glucose-Capillary: 161 mg/dL — ABNORMAL HIGH (ref 65–99)

## 2016-02-15 LAB — HEMOGLOBIN A1C
HEMOGLOBIN A1C: 10.8 % — AB (ref 4.8–5.6)
Mean Plasma Glucose: 263 mg/dL

## 2016-02-15 LAB — ECHOCARDIOGRAM COMPLETE: WEIGHTICAEL: 4800 [oz_av]

## 2016-02-15 LAB — VITAMIN B12: Vitamin B-12: 361 pg/mL (ref 180–914)

## 2016-02-15 MED ORDER — PERFLUTREN LIPID MICROSPHERE
1.0000 mL | INTRAVENOUS | Status: AC | PRN
  Administered 2016-02-15: 3 mL via INTRAVENOUS

## 2016-02-15 MED ORDER — LEVETIRACETAM 250 MG PO TABS
250.0000 mg | ORAL_TABLET | Freq: Two times a day (BID) | ORAL | Status: DC
  Administered 2016-02-15 – 2016-02-16 (×2): 250 mg via ORAL
  Filled 2016-02-15 (×2): qty 1

## 2016-02-15 NOTE — Progress Notes (Addendum)
PROGRESS NOTE    Oscar Rose  [id number] DOB: Apr 20, 1961 DOA: 02/13/2016 PCP: Sherrie Mustache, MD     Brief Narrative:  55 year old man admitted to the hospital on 7/11 from home after a fall. Also complained of weakness of his left arm, dizziness, headache as well as visual disturbances.   Assessment & Plan:   Principal Problem:   Cerebral infarction due to unspecified mechanism Active Problems:   Dehydration   Fall at home   Hyponatremia   Encephalopathy   Essential hypertension   Multiple embolic right brain CVA -Workup has shown symptomatic right carotid high-grade stenosis. -This has been discussed with Deitra Mayo, vascular surgeon. Will need to be seen soon in follow-up to plan for carotid endarterectomy. Okay to continue aspirin. -Has also been advised on risk factor modification, continue aspirin for secondary stroke prevention. -2-D echocardiogram pending. -Will request speech therapy consultation for language and cognition evaluation. -Appreciate neurology input and recommendations.  Hyperlipidemia -Continue statin   Hypertension -Continue Cozaar, fair control.  Stage II chronic kidney disease -Creatinine remains at baseline of around 1.5.   Type 2 diabetes -Well-controlled, continue current regimen   DVT prophylaxis: SCDs Code Status: Full code Family Communication: Patient only Disposition Plan: CIR versus SNF  Consultants:   None  Procedures:   None  Antimicrobials:   None    Subjective: Still skeptical about the fact that he had a stroke, wants a day to discuss with friends and family whether rehabilitation is appropriate for him.  Objective: Filed Vitals:   02/15/16 0735 02/15/16 0900 02/15/16 1135 02/15/16 1535  BP: 138/81 138/65 166/75 152/76  Pulse: 95 83 85 84  Temp: 98.2 F (36.8 C) 98.6 F (37 C) 98.2 F (36.8 C) 98.1 F (36.7 C)  TempSrc: Oral Oral Oral Oral  Resp: 18  20 20   Weight:      SpO2:  95% 95% 93% 99%    Intake/Output Summary (Last 24 hours) at 02/15/16 1654 Last data filed at 02/15/16 1300  Gross per 24 hour  Intake    720 ml  Output      0 ml  Net    720 ml   Filed Weights   02/14/16 0146  Weight: 136.079 kg (300 lb)    Examination:  General exam: Alert, awake, oriented x 3 Respiratory system: Clear to auscultation. Respiratory effort normal. Cardiovascular system:RRR. No murmurs, rubs, gallops. Gastrointestinal system: Abdomen is nondistended, soft and nontender. No organomegaly or masses felt. Normal bowel sounds heard. Central nervous system: Alert and oriented. Left arm weakness Extremities: No C/C/E, +pedal pulses Skin: No rashes, lesions or ulcers    Data Reviewed: I have personally reviewed following labs and imaging studies  CBC:  Recent Labs Lab 02/13/16 1549 02/15/16 0512  WBC 12.0* 8.4  NEUTROABS 9.6*  --   HGB 13.3 13.9  HCT 39.0 41.0  MCV 86.5 87.6  PLT 215 [id number]   Basic Metabolic Panel:  Recent Labs Lab 02/13/16 1549 02/14/16 0554  NA 131* 134*  K 3.9 3.8  CL 99* 101  CO2 22 24  GLUCOSE 227* 166*  BUN 25* 27*  CREATININE 1.47* 1.40*  CALCIUM 8.3* 8.3*   GFR: CrCl cannot be calculated (Unknown ideal weight.). Liver Function Tests:  Recent Labs Lab 02/13/16 1549 02/14/16 0554  AST 22 19  ALT 22 21  ALKPHOS 71 69  BILITOT 1.0 0.9  PROT 6.6 6.7  ALBUMIN 3.3* 3.4*    Recent Labs Lab 02/13/16 1549  LIPASE 13  No results for input(s): AMMONIA in the last 168 hours. Coagulation Profile: No results for input(s): INR, PROTIME in the last 168 hours. Cardiac Enzymes:  Recent Labs Lab 02/13/16 1606  TROPONINI 0.05*   BNP (last 3 results) No results for input(s): PROBNP in the last 8760 hours. HbA1C:  Recent Labs  02/13/16 1549  HGBA1C 10.8*   CBG:  Recent Labs Lab 02/14/16 1605 02/14/16 2250 02/15/16 0745 02/15/16 1145 02/15/16 1636  GLUCAP 129* 112* 103* 98 92   Lipid Profile:  Recent  Labs  02/14/16 0554  CHOL 270*  HDL 32*  LDLCALC 160*  TRIG 391*  CHOLHDL 8.4   Thyroid Function Tests: No results for input(s): TSH, T4TOTAL, FREET4, T3FREE, THYROIDAB in the last 72 hours. Anemia Panel:  Recent Labs  02/15/16 0512  VITAMINB12 361   Urine analysis:    Component Value Date/Time   COLORURINE YELLOW 02/13/2016 2220   APPEARANCEUR HAZY* 02/13/2016 2220   LABSPEC >1.030* 02/13/2016 2220   PHURINE 6.0 02/13/2016 2220   GLUCOSEU >1000* 02/13/2016 2220   HGBUR LARGE* 02/13/2016 2220   BILIRUBINUR SMALL* 02/13/2016 2220   KETONESUR TRACE* 02/13/2016 2220   PROTEINUR >300* 02/13/2016 2220   UROBILINOGEN 0.2 05/31/2015 0752   NITRITE NEGATIVE 02/13/2016 2220   LEUKOCYTESUR NEGATIVE 02/13/2016 2220   Sepsis Labs: @LABRCNTIP (procalcitonin:4,lacticidven:4)  )No results found for this or any previous visit (from the past 240 hour(s)).       Radiology Studies: Ct Head Wo Contrast  02/13/2016  CLINICAL DATA:  Fall yesterday, head pain EXAM: CT HEAD WITHOUT CONTRAST TECHNIQUE: Contiguous axial images were obtained from the base of the skull through the vertex without intravenous contrast. COMPARISON:  None. FINDINGS: No skull fracture is noted. Paranasal sinuses and mastoid air cells are unremarkable. No intracranial hemorrhage, mass effect or midline shift. Axial image twenty-five and 26 there is subtle area of cortical decreased attenuation in right frontal lobe. Subacute or evolving infarct cannot be excluded. Clinical correlation is necessary. Further correlation with MRI with diffusion imaging is recommended as clinically warranted. No mass lesion is noted on this unenhanced scan. No hydrocephalus. No intraventricular hemorrhage. There is focal decreased attenuation in right caudate nucleus measures about 5 mm. Also right temporal lobe axial image 14 This may represent infarct of indeterminate age. IMPRESSION: No intracranial hemorrhage, mass effect or midline shift.  Axial image twenty-five and 26 there is subtle area of cortical decreased attenuation in right frontal lobe. Subacute or evolving infarct cannot be excluded. Further correlation with MRI is recommended as clinically warranted. There is focal decreased attenuation in right caudate nucleus measures about 5 mm. Also right temporal lobe axial image 14. This may represent infarct of indeterminate age. These results were called by telephone at the time of interpretation on 02/13/2016 at 7:36 pm to Dr. Nat Christen , who verbally acknowledged these results. Electronically Signed   By: Lahoma Crocker M.D.   On: 02/13/2016 19:38   Mr Virgel Paling Wo Contrast  02/14/2016  CLINICAL DATA:  55 year old male with 2 days of altered mental status, weakness, inability to walk. Patient fell yesterday and does not remember falling. EXAM: MRI HEAD WITHOUT CONTRAST MRA HEAD WITHOUT CONTRAST TECHNIQUE: Multiplanar, multiecho pulse sequences of the brain and surrounding structures were obtained without intravenous contrast. Angiographic images of the head were obtained using MRA technique without contrast. COMPARISON:  CT of the head 02/13/2016. FINDINGS: MRI HEAD FINDINGS Brain: There are several areas of diffusion restriction within the right MCA distribution within the  right anterolateral frontal lobe, scattered small foci in the right parietal lobe, within the right posterior temporal lobe, at the right temporo-occipital junction, and small foci within the right caudate nucleus head and body compatible with acute/early subacute infarctions. No additional focus of diffusion restriction is identified elsewhere in the brain parenchyma. There is T2 FLAIR hyperintense signal abnormality and slight focal mass effect associated with the areas of infarction. No abnormal susceptibility hyperintensity to indicate intracranial hemorrhage. Extra-axial space: Normal ventricular size. No hydrocephalus or midline shift. No effacement of basilar cisterns. No  extra-axial collection is identified. Proximal intracranial flow voids are maintained. No abnormality of the cervical medullary junction. Other: Partial opacification of the left sphenoid sinus, otherwise the visualized paranasal sinuses demonstrate normal signal. No abnormal signal of the mastoid air cells. Inner ear structures are grossly unremarkable. Orbits are unremarkable. Calvarium is unremarkable. MRA HEAD FINDINGS Internal carotid arteries:  Patent. Anterior cerebral arteries:  Patent. Middle cerebral arteries: Patent. Anterior communicating artery: Patent. Posterior communicating arteries:  Patent. Posterior cerebral arteries: Patent. Small P1 segments with large posterior communicating arteries compatible with persistent fetal circulation, normal variant. Basilar artery:  Patent. Vertebral arteries:  Patent. No evidence of high-grade stenosis, large vessel occlusion, or aneurysm unless noted above. IMPRESSION: 1. Several areas of acute/early subacute infarction within the right MCA distribution as described corresponding to areas of hypoattenuation on the prior CT of the head. No evidence of intracranial hemorrhage. 2. No large vessel occlusion, high-grade stenosis, or aneurysm of the circle of Willis is identified. Electronically Signed   By: Kristine Garbe M.D.   On: 02/14/2016 11:42   Mr Brain Wo Contrast  02/14/2016  CLINICAL DATA:  55 year old male with 2 days of altered mental status, weakness, inability to walk. Patient fell yesterday and does not remember falling. EXAM: MRI HEAD WITHOUT CONTRAST MRA HEAD WITHOUT CONTRAST TECHNIQUE: Multiplanar, multiecho pulse sequences of the brain and surrounding structures were obtained without intravenous contrast. Angiographic images of the head were obtained using MRA technique without contrast. COMPARISON:  CT of the head 02/13/2016. FINDINGS: MRI HEAD FINDINGS Brain: There are several areas of diffusion restriction within the right MCA  distribution within the right anterolateral frontal lobe, scattered small foci in the right parietal lobe, within the right posterior temporal lobe, at the right temporo-occipital junction, and small foci within the right caudate nucleus head and body compatible with acute/early subacute infarctions. No additional focus of diffusion restriction is identified elsewhere in the brain parenchyma. There is T2 FLAIR hyperintense signal abnormality and slight focal mass effect associated with the areas of infarction. No abnormal susceptibility hyperintensity to indicate intracranial hemorrhage. Extra-axial space: Normal ventricular size. No hydrocephalus or midline shift. No effacement of basilar cisterns. No extra-axial collection is identified. Proximal intracranial flow voids are maintained. No abnormality of the cervical medullary junction. Other: Partial opacification of the left sphenoid sinus, otherwise the visualized paranasal sinuses demonstrate normal signal. No abnormal signal of the mastoid air cells. Inner ear structures are grossly unremarkable. Orbits are unremarkable. Calvarium is unremarkable. MRA HEAD FINDINGS Internal carotid arteries:  Patent. Anterior cerebral arteries:  Patent. Middle cerebral arteries: Patent. Anterior communicating artery: Patent. Posterior communicating arteries:  Patent. Posterior cerebral arteries: Patent. Small P1 segments with large posterior communicating arteries compatible with persistent fetal circulation, normal variant. Basilar artery:  Patent. Vertebral arteries:  Patent. No evidence of high-grade stenosis, large vessel occlusion, or aneurysm unless noted above. IMPRESSION: 1. Several areas of acute/early subacute infarction within the right MCA distribution as  described corresponding to areas of hypoattenuation on the prior CT of the head. No evidence of intracranial hemorrhage. 2. No large vessel occlusion, high-grade stenosis, or aneurysm of the circle of Willis is  identified. Electronically Signed   By: Kristine Garbe M.D.   On: 02/14/2016 11:42   US Carotid Bilateral  02/14/2016  CLINICAL DATA:  Altered mental status, headache, syncope with fall, left arm numbness. Hyperlipidemia, diabetes. EXAM: BILATERAL CAROTID DUPLEX ULTRASOUND TECHNIQUE: Pearline Cables scale imaging, color Doppler and duplex ultrasound was performed of bilateral carotid and vertebral arteries in the neck. COMPARISON:  None. TECHNIQUE: Quantification of carotid stenosis is based on velocity parameters that correlate the residual internal carotid diameter with NASCET-based stenosis levels, using the diameter of the distal internal carotid lumen as the denominator for stenosis measurement. The following velocity measurements were obtained: PEAK SYSTOLIC/END DIASTOLIC RIGHT ICA:                     367/96cm/sec CCA:                     [id number] SYSTOLIC ICA/CCA RATIO:  3.1 DIASTOLIC ICA/CCA RATIO: 4.5 ECA:                     168cm/sec LEFT ICA:                     120/43cm/sec CCA:                     [id number] SYSTOLIC ICA/CCA RATIO:  1.4 DIASTOLIC ICA/CCA RATIO: 1.7 ECA:                     210cm/sec FINDINGS: RIGHT CAROTID ARTERY: Partial calcified plaque and effaces the carotid bulb and extends into the proximal ICA resulting in at least moderate stenosis. Elevated peak systolic velocities are recorded in the proximal internal and external iliac arteries just distal to the plaque. RIGHT VERTEBRAL ARTERY:  Normal flow direction and waveform. LEFT CAROTID ARTERY: Mild circumferential put scratch the circumferential plaque in the carotid bulb and proximal ICA resulting in at least mild stenosis. Normal waveforms and color Doppler signal. LEFT VERTEBRAL ARTERY: Normal flow direction and waveform. IMPRESSION: 1. Bilateral carotid bifurcation and proximal ICA plaque, resulting in 70-99% diameter right ICA stenosis, less than 50% diameter left ICA stenosis. Consider vascular surgical or  neurointerventional radiology consultation. 2.  Antegrade bilateral vertebral arterial flow. Electronically Signed   By: Lucrezia Europe M.D.   On: 02/14/2016 11:12        Scheduled Meds: . aspirin  300 mg Rectal Daily   Or  . aspirin  325 mg Oral Daily  . atorvastatin  40 mg Oral q1800  . gemfibrozil  600 mg Oral BID AC  . insulin aspart  0-9 Units Subcutaneous TID WC  . insulin glargine  100 Units Subcutaneous QHS  . losartan  25 mg Oral Daily   Continuous Infusions: . sodium chloride 75 mL/hr at 02/14/16 1735     LOS: 2 days    Time spent: 25 minutes. Greater than 50% of this time was spent in direct contact with the patient coordinating care.     Lelon Frohlich, MD [facility] Pager [phone]  If 7PM-7AM, please contact night-coverage [email or web address] Password [facility] 02/15/2016, 4:54 PM

## 2016-02-15 NOTE — Care Management Note (Signed)
Case Management Note  Patient Details  Name: PAPE YAHR MRN: [id number] Date of Birth: [birth year]  Subjective/Objective:                  Admitted with CVA. Pt is from home, lives with roommate. PT has recommended CIR/SNF. Pt not interested in CIR but is interested in SNF. CSW has seen pt and will make arrangements for placement.   Action/Plan: No CM needs anticipated, anticipate DC on 02/15/2016.  Expected Discharge Date:    713/2017              Expected Discharge Plan:  Skilled Nursing Facility  In-House Referral:  Clinical Social Work  Discharge planning Services  CM Consult  Post Acute Care Choice:  NA Choice offered to:  NA  DME Arranged:    DME Agency:     HH Arranged:    [place] Agency:     Status of Service:  Completed, signed off  If discussed at [employer], dates discussed:    Additional Comments:  Sherald Barge, RN 02/15/2016, 2:14 PM

## 2016-02-15 NOTE — Progress Notes (Signed)
Nunez A. [name], MD     [email or web address]          Oscar Rose is an 55 y.o. male.   Assessment/Plan: 1. Symptomatic right carotid high-grade stenosis presented with left-sided numbness, weakness and the left homonymous hemianopia. Risk factors are many including hypertension, diabetes, dyslipidemia, untreated sleep apnea syndrome and obesity.  2. Associated confusion and oral trauma/biting of the tongue quite worrisome for seizures.  RECOMMENDATION: The patient should have urgent carotid endarterectomy. He is currently reluctant and seemed to have indifference attitude to his medical well-being. However, recommend that the patient should have outpatient evaluation for endarterectomy or other revascularization procedures.  Continue with aspirin.  EEG.  Long-term risk factor reduction including diabetes control, weight loss, blood pressure control and better control of dyslipidemia. He also recommended that he retry season his CPAP machine.   Additional labs for the following: RPR, HIV, homocysteine level and vitamin B12.       The patient seems agreeable to have surgery done today.    GENERAL: This is a pleasant morbidly obese man in no acute distress.  HEENT: He has a large stocky neck. There is significant trauma involving the tip of the tongue from biting. Tongue size is large with circumferential crowding of the posterior space.  ABDOMEN: soft  EXTREMITIES: No edema   BACK: Normal.  SKIN: Normal by inspection.   MENTAL STATUS: He is awake and alert. He is oriented to year month and location. However, he states his age is 25. No evidence of dysarthria or aphasia. He follows commands well.  CRANIAL NERVES: Pupils are equal, round and reactive to light and accomodation; extra ocular movements are full, there is no significant nystagmus; visual fields are full; upper and lower facial muscles are normal in strength and symmetric, there  is no flattening of the nasolabial folds; tongue is midline; uvula is midline; shoulder elevation is normal. There is a dense left homonymous hemianopia versus blindness on the left eye which seems more consistent with examination today.  MOTOR: Normal tone, bulk and strength - except for hand muscles are slightly weak 4+/5; LUE pronator drift. There is a significant drift of the left upper extremity. There is no drift of the legs.  COORDINATION: Left finger to nose is normal, right finger to nose is normal, No rest tremor; no intention tremor; no postural tremor; no bradykinesia.  REFLEXES: Deep tendon reflexes are symmetrical and normal.   SENSATION: Normal to light touch and temperature. The patient extinguished to double simultaneous stimulation tactilely on the left side.      NIH stroke scale 1, 2, 1, 1 total 5.    Objective: Vital signs in last 24 hours: Temp:  [97.5 F (36.4 C)-98.6 F (37 C)] 98.1 F (36.7 C) (07/13 1535) Pulse Rate:  [81-95] 84 (07/13 1535) Resp:  [15-20] 20 (07/13 1535) BP: (138-166)/(65-81) 152/76 mmHg (07/13 1535) SpO2:  [93 %-100 %] 99 % (07/13 1535)  Intake/Output from previous day: 07/12 0701 - 07/13 0700 In: 1630 [P.O.:720; I.V.:910] Out: -  Intake/Output this shift:   Nutritional status: Diet Carb Modified Fluid consistency:: Thin; Room service appropriate?: Yes   Lab Results: Results for orders placed or performed during the hospital encounter of 02/13/16 (from the past 48 hour(s))  Urinalysis, Routine w reflex microscopic (not at [facility])     Status: Abnormal   Collection Time: 02/13/16 10:20 PM  Result Value Ref Range   Color, Urine YELLOW YELLOW   APPearance HAZY (A)  CLEAR   Specific Gravity, Urine >1.030 (H) 1.005 - 1.030   pH 6.0 5.0 - 8.0   Glucose, UA >1000 (A) NEGATIVE mg/dL   Hgb urine dipstick LARGE (A) NEGATIVE   Bilirubin Urine SMALL (A) NEGATIVE   Ketones, ur TRACE (A) NEGATIVE mg/dL   Protein, ur >300 (A) NEGATIVE  mg/dL   Nitrite NEGATIVE NEGATIVE   Leukocytes, UA NEGATIVE NEGATIVE  Urine rapid drug screen (hosp performed)     Status: None   Collection Time: 02/13/16 10:20 PM  Result Value Ref Range   Opiates NONE DETECTED NONE DETECTED   Cocaine NONE DETECTED NONE DETECTED   Benzodiazepines NONE DETECTED NONE DETECTED   Amphetamines NONE DETECTED NONE DETECTED   Tetrahydrocannabinol NONE DETECTED NONE DETECTED   Barbiturates NONE DETECTED NONE DETECTED    Comment:        DRUG SCREEN FOR MEDICAL PURPOSES ONLY.  IF CONFIRMATION IS NEEDED FOR ANY PURPOSE, NOTIFY LAB WITHIN 5 DAYS.        LOWEST DETECTABLE LIMITS FOR URINE DRUG SCREEN Drug Class       Cutoff (ng/mL) Amphetamine      1000 Barbiturate      200 Benzodiazepine   696 Tricyclics       295 Opiates          300 Cocaine          300 THC              50   Urine microscopic-add on     Status: Abnormal   Collection Time: 02/13/16 10:20 PM  Result Value Ref Range   Squamous Epithelial / LPF 0-5 (A) NONE SEEN   WBC, UA 0-5 0 - 5 WBC/hpf   RBC / HPF 0-5 0 - 5 RBC/hpf   Bacteria, UA FEW (A) NONE SEEN   Casts GRANULAR CAST (A) NEGATIVE    Comment: HYALINE CASTS   Crystals URIC ACID CRYSTALS (A) NEGATIVE  Blood gas, arterial     Status: None   Collection Time: 02/14/16 12:02 AM  Result Value Ref Range   O2 Content 2.0 L/min   Delivery systems NASAL CANNULA    pH, Arterial 7.398 7.350 - 7.450   pCO2 arterial 38.8 35.0 - 45.0 mmHg   pO2, Arterial 82.1 80.0 - 100.0 mmHg   Bicarbonate 23.8 20.0 - 24.0 mEq/L   TCO2 17.8 0 - 100 mmol/L   Acid-Base Excess 0.8 0.0 - 2.0 mmol/L   O2 Saturation 95.7 %   Collection site RIGHT RADIAL    Drawn by [id number]    Sample type ARTERIAL DRAW    Allens test (pass/fail) PASS PASS  Glucose, capillary     Status: Abnormal   Collection Time: 02/14/16  2:25 AM  Result Value Ref Range   Glucose-Capillary 184 (H) 65 - 99 mg/dL  Lipid panel     Status: Abnormal   Collection Time: 02/14/16  5:54 AM    Result Value Ref Range   Cholesterol 270 (H) 0 - 200 mg/dL   Triglycerides 391 (H) <150 mg/dL   HDL 32 (L) >40 mg/dL   Total CHOL/HDL Ratio 8.4 RATIO   VLDL 78 (H) 0 - 40 mg/dL   LDL Cholesterol 160 (H) 0 - 99 mg/dL    Comment:        Total Cholesterol/HDL:CHD Risk Coronary Heart Disease Risk Table                     Men   Women  1/2 Average Risk   3.4   3.3  Average Risk       5.0   4.4  2 X Average Risk   9.6   7.1  3 X Average Risk  23.4   11.0        Use the calculated Patient Ratio above and the CHD Risk Table to determine the patient's CHD Risk.        ATP III CLASSIFICATION (LDL):  <100     mg/dL   Optimal  100-129  mg/dL   Near or Above                    Optimal  130-159  mg/dL   Borderline  160-189  mg/dL   High  >190     mg/dL   Very High   Comprehensive metabolic panel     Status: Abnormal   Collection Time: 02/14/16  5:54 AM  Result Value Ref Range   Sodium 134 (L) 135 - 145 mmol/L   Potassium 3.8 3.5 - 5.1 mmol/L   Chloride 101 101 - 111 mmol/L   CO2 24 22 - 32 mmol/L   Glucose, Bld 166 (H) 65 - 99 mg/dL   BUN 27 (H) 6 - 20 mg/dL   Creatinine, Ser 1.40 (H) 0.61 - 1.24 mg/dL   Calcium 8.3 (L) 8.9 - 10.3 mg/dL   Total Protein 6.7 6.5 - 8.1 g/dL   Albumin 3.4 (L) 3.5 - 5.0 g/dL   AST 19 15 - 41 U/L   ALT 21 17 - 63 U/L   Alkaline Phosphatase 69 38 - 126 U/L   Total Bilirubin 0.9 0.3 - 1.2 mg/dL   GFR calc non Af Amer 56 (L) >60 mL/min   GFR calc Af Amer >60 >60 mL/min    Comment: (NOTE) The eGFR has been calculated using the CKD EPI equation. This calculation has not been validated in all clinical situations. eGFR's persistently <60 mL/min signify possible Chronic Kidney Disease.    Anion gap 9 5 - 15  Glucose, capillary     Status: Abnormal   Collection Time: 02/14/16  7:50 AM  Result Value Ref Range   Glucose-Capillary 153 (H) 65 - 99 mg/dL  Glucose, capillary     Status: Abnormal   Collection Time: 02/14/16  7:57 AM  Result Value Ref Range    Glucose-Capillary 296 (H) 65 - 99 mg/dL  Glucose, capillary     Status: None   Collection Time: 02/14/16 11:20 AM  Result Value Ref Range   Glucose-Capillary 93 65 - 99 mg/dL   Comment 1 Notify RN    Comment 2 Document in Chart   Glucose, capillary     Status: Abnormal   Collection Time: 02/14/16  4:05 PM  Result Value Ref Range   Glucose-Capillary 129 (H) 65 - 99 mg/dL  Glucose, capillary     Status: Abnormal   Collection Time: 02/14/16 10:50 PM  Result Value Ref Range   Glucose-Capillary 112 (H) 65 - 99 mg/dL  CBC     Status: None   Collection Time: 02/15/16  5:12 AM  Result Value Ref Range   WBC 8.4 4.0 - 10.5 K/uL   RBC 4.68 4.22 - 5.81 MIL/uL   Hemoglobin 13.9 13.0 - 17.0 g/dL   HCT 41.0 39.0 - 52.0 %   MCV 87.6 78.0 - 100.0 fL   MCH 29.7 26.0 - 34.0 pg   MCHC 33.9 30.0 - 36.0 g/dL   RDW  13.0 11.5 - 15.5 %   Platelets 214 150 - 400 K/uL  Vitamin B12     Status: None   Collection Time: 02/15/16  5:12 AM  Result Value Ref Range   Vitamin B-12 361 180 - 914 pg/mL    Comment: (NOTE) This assay is not validated for testing neonatal or myeloproliferative syndrome specimens for Vitamin B12 levels. Performed at [facility]   Glucose, capillary     Status: Abnormal   Collection Time: 02/15/16  7:45 AM  Result Value Ref Range   Glucose-Capillary 103 (H) 65 - 99 mg/dL  Glucose, capillary     Status: None   Collection Time: 02/15/16 11:45 AM  Result Value Ref Range   Glucose-Capillary 98 65 - 99 mg/dL   Comment 1 Notify RN    Comment 2 Document in Chart   Glucose, capillary     Status: None   Collection Time: 02/15/16  4:36 PM  Result Value Ref Range   Glucose-Capillary 92 65 - 99 mg/dL    Lipid Panel  Recent Labs  02/14/16 0554  CHOL 270*  TRIG 391*  HDL 32*  CHOLHDL 8.4  VLDL 78*  LDLCALC 160*    Studies/Results:   Medications:  Scheduled Meds: . aspirin  300 mg Rectal Daily   Or  . aspirin  325 mg Oral Daily  . atorvastatin  40 mg Oral  q1800  . gemfibrozil  600 mg Oral BID AC  . insulin aspart  0-9 Units Subcutaneous TID WC  . insulin glargine  100 Units Subcutaneous QHS  . losartan  25 mg Oral Daily   Continuous Infusions: . sodium chloride 75 mL/hr at 02/14/16 1735   PRN Meds:.     LOS: 2 days   Garhett Bernhard A. [name], M.D.  Diplomate, Tax adviser of Psychiatry and Neurology ( Neurology).

## 2016-02-15 NOTE — Progress Notes (Signed)
*  PRELIMINARY RESULTS* Echocardiogram 2D Echocardiogram has been performed with Definity.  Oscar Rose 02/15/2016, 1:07 PM

## 2016-02-15 NOTE — Progress Notes (Signed)
Physical Therapy Treatment Patient Details Name: Oscar Rose MRN: [id number] DOB: [birth year] Today's Date: 02/15/2016    History of Present Illness 55 yo M admitted 02/13/2016 s/p fall recent sinus infection with bad HA, numbness/tingling and weakness of his L UE.  Head CT with questionable infarct.   MRI Several areas of acute/early subacute infarction within the right MCA distribution as described corresponding to areas of hypoattenuation on the prior CT of the head. No evidence of intracranial hemorrhage.  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  Pt expressed visual changes where it looked to him like there were pieces of the ceiling that were hanging down from the ceiling.  Pt also expressed seeing fake doors while ambulating in the hallway.  When he sat down in the chair he asked the PT "why are you turning me on my stomach" when the pt was stationary in the chair - pt expressed feeling as if he was on a boat.  Pt continues to require increased assistance for mobility.  Upon standing he demonstrated moderate LOB which required assist from PT to prevent falling.  Pt also demonstrates some visual inattention and difficulty finding his way back to his room despite being able to recall his room number.  At this point, he is a high risk for falling with cognitive deficits compounding his mobility deficits.  He is not safe to return home - his roommate works during the day.  He is recommended for acute inpatient rehab vs SNF.  However, pt has been reluctant to these choices.  Therefore, if he does go home, he will need strict 24/7 supervision/assistance, as well as a RW, BSC, and if possible a w/c for longer distances.   Follow Up  Recommendations  CIR;SNF;Supervision/Assistance - 24 hour     Equipment Recommendations  None recommended by PT    Recommendations for Other Services Speech consult (Cognitive)     Precautions / Restrictions Precautions Precautions: Fall Restrictions Weight Bearing Restrictions: No    Mobility  Bed Mobility Overal bed mobility: Needs Assistance Bed Mobility: Supine to Sit     Supine to sit: Supervision     General bed mobility comments: due to impulsivity.   Transfers Overall transfer level: Needs assistance Equipment used: Rolling walker (2 wheeled) Transfers: Sit to/from Stand Sit to Stand: Min guard         General transfer comment: Pt demonstrates moderate LOB upon standing, and required Mod A to prevent falling.    Ambulation/Gait Ambulation/Gait assistance: Min assist Ambulation Distance (Feet): 50 Feet Assistive device: Rolling walker (2 wheeled) Gait Pattern/deviations: Step-through pattern     General Gait Details: Pt demonsrtated improved midline awareness today, however he continues to demonstrate poor safety awareness, and needed extensive cues to prevent from going into the wrong room again today - despite being able to recall what his room number was.    Stairs            Wheelchair Mobility    Modified Rankin (Stroke Patients Only)       Balance   Sitting-balance support: Bilateral upper extremity supported Sitting balance-Leahy Scale: Good     Standing balance support: Bilateral upper extremity supported Standing balance-Leahy Scale: Fair  Cognition Arousal/Alertness: Awake/alert Behavior During Therapy: WFL for tasks assessed/performed Overall Cognitive Status: No family/caregiver present to determine baseline cognitive functioning Area of Impairment: Safety/judgement;Awareness     Memory: Decreased short-term memory;Decreased recall of precautions   Safety/Judgement: Decreased awareness of  safety;Decreased awareness of deficits     General Comments: Pt expressed to PT that he does not believe that he has had a CVA.  He demonstrates and expresses multiple symptoms of CVA which was confirmed on MRI, however he states that he still does not believe he has had one.      Exercises Other Exercises Other Exercises: 5x sit<>stand with min guard.     General Comments        Pertinent Vitals/Pain Pain Assessment: No/denies pain    Home Living                      Prior Function            PT Goals (current goals can now be found in the care plan section) Acute Rehab PT Goals Patient Stated Goal: To go home PT Goal Formulation: With patient Time For Goal Achievement: 02/21/16 Potential to Achieve Goals: Fair Progress towards PT goals: Progressing toward goals    Frequency  7X/week    PT Plan      Co-evaluation             End of Session Equipment Utilized During Treatment: Gait belt Activity Tolerance: Patient tolerated treatment well Patient left: in bed;with bed alarm set;with nursing/sitter in room     Time: [id number] PT Time Calculation (min) (ACUTE ONLY): 26 min  Charges:  $Gait Training: 8-22 mins $Therapeutic Activity: 8-22 mins                    G Codes:      Beth Marah [place], PT, DPT X: [phone]

## 2016-02-15 NOTE — Clinical Social Work Placement (Signed)
   CLINICAL SOCIAL WORK PLACEMENT  NOTE  Date:  02/15/2016  Patient Details  Name: Oscar Rose MRN: [id number] Date of Birth: 10-04-1960  Clinical Social Work is seeking post-discharge placement for this patient at the [place] level of care (*CSW will initial, date and re-position this form in  chart as items are completed):  Yes   Patient/family provided with [place] Work Department's list of facilities offering this level of care within the geographic area requested by the patient (or if unable, by the patient's family).  Yes   Patient/family informed of their freedom to choose among providers that offer the needed level of care, that participate in Medicare, Medicaid or managed care program needed by the patient, have an available bed and are willing to accept the patient.  Yes   Patient/family informed of [facility]'s ownership interest in [facility] and [facility], as well as of the fact that they are under no obligation to receive care at these facilities.  PASRR submitted to EDS on 02/15/16     PASRR number received on 02/15/16     Existing PASRR number confirmed on       FL2 transmitted to all facilities in geographic area requested by pt/family on 02/15/16     FL2 transmitted to all facilities within larger geographic area on       Patient informed that his/her managed care company has contracts with or will negotiate with certain facilities, including the following:            Patient/family informed of bed offers received.  Patient chooses bed at       Physician recommends and patient chooses bed at      Patient to be transferred to   on  .  Patient to be transferred to facility by       Patient family notified on   of transfer.  Name of family member notified:        PHYSICIAN       Additional Comment:    _______________________________________________ [name], [place] 02/15/2016, 1:53  PM [phone]

## 2016-02-15 NOTE — Procedures (Signed)
  Waverly A. [name], MD     [email or web address]           HISTORY: The patient presents with episodes of confusion and altered mental status. The spells are associated with tongue biting worrisome for seizures. The patient has had an acute right hemispheric infarct.  MEDICATIONS: Scheduled Meds: . aspirin  300 mg Rectal Daily   Or  . aspirin  325 mg Oral Daily  . atorvastatin  40 mg Oral q1800  . gemfibrozil  600 mg Oral BID AC  . insulin aspart  0-9 Units Subcutaneous TID WC  . insulin glargine  100 Units Subcutaneous QHS  . levETIRAcetam  250 mg Oral BID  . losartan  25 mg Oral Daily   Continuous Infusions: . sodium chloride 75 mL/hr at 02/14/16 1735   PRN Meds:.  Prior to Admission medications   Medication Sig Start Date End Date Taking? Authorizing Provider  acetaminophen (TYLENOL) 500 MG tablet Take 1,000 mg by mouth every 6 (six) hours as needed for mild pain or moderate pain.   Yes Historical Provider, MD  amoxicillin (AMOXIL) 500 MG capsule Take 500 mg by mouth 3 (three) times daily.   Yes Historical Provider, MD  Choline Fenofibrate (TRILIPIX) 135 MG capsule Take 135 mg by mouth daily.   Yes Historical Provider, MD  glipiZIDE (GLUCOTROL XL) 10 MG 24 hr tablet Take 20 mg by mouth at bedtime.    Yes Historical Provider, MD  insulin glargine (LANTUS) 100 UNIT/ML injection Inject 100 Units into the skin at bedtime.    Yes Historical Provider, MD  losartan (COZAAR) 100 MG tablet Take 100 mg by mouth at bedtime. 05/19/15 05/18/16 Yes Historical Provider, MD  pravastatin (PRAVACHOL) 40 MG tablet Take 40 mg by mouth at bedtime.  09/05/14 02/13/16 Yes Historical Provider, MD      ANALYSIS: A 16 channel recording using standard 10 20 measurements is conducted for 21 minutes. The background activity is as high as 7 Hz on the left side and 6 Hz on the right side. Awake and drowsy activities are observed. There is beta activity observing the frontal areas.  Photic simulation is carried out without abnormal changes in the background activity. There is frequent right temporal delta slowing at 4 Hz throughout the recording. There are several episodes of sharp wave activity that phase reverses at T4. No electrographic seizures are observed however.   IMPRESSION: This recording is abnormal for the following reasons: 1. Frequent right temporal slowing indicating a right temporal cerebral disturbance. However, focal slowing can also be associated with the epileptic seizures. 2. 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 3. Right hemispheric slowing indicating a right hemispheric cerebral disturbance. This is most likely due to the underlying stroke. 4. Overall mild global slowing indicating a mild global encephalopathy.  Given the patient's clinical history and the above findings, he will be started on low-dose Keppra.      Lucero Auzenne A. [name], M.D.  Diplomate, Tax adviser of Psychiatry and Neurology ( Neurology).

## 2016-02-15 NOTE — Clinical Social Work Note (Signed)
Clinical Social Work Assessment  Patient Details  Name: Oscar Rose MRN: [id number] Date of Birth: 10-Sep-1960  Date of referral:  02/15/16               Reason for consult:  Discharge Planning                Permission sought to share information with:    Permission granted to share information::     Name::        Agency::     Relationship::     Contact Information:     Housing/Transportation Living arrangements for the past 2 months:  Single Family Home Source of Information:  Patient Patient Interpreter Needed:  None Criminal Activity/Legal Involvement Pertinent to Current Situation/Hospitalization:  No - Comment as needed Significant Relationships:  Friend Lives with:  Roommate Do you feel safe going back to the place where you live?  No Need for family participation in patient care:  No (Coment)  Care giving concerns:  Pt does not have adequate support at home.    Social Worker assessment / plan:  CSW met with pt at bedside. Pt states he lives with a roommate. Admitted due to CVA. Pt reports he is independent at baseline. His best support are friends, Fritz Pickerel and April. PT evaluated pt and recommend SNF. CSW discussed placement process, including Medicare coverage/criteria. Pt is willing to go to SNF at [facility] or [place] if bed is not available there.   Employment status:  Disabled (Comment on whether or not currently receiving Disability) Insurance information:  Medicare PT Recommendations:  [place] / Referral to community resources:  [place]  Patient/Family's Response to care:  Pt agreeable to SNF at d/c.   Patient/Family's Understanding of and Emotional Response to Diagnosis, Current Treatment, and Prognosis:  Pt appears to have more understanding of admission diagnosis following discussion with MD today. He is aware of recommendations and agrees that SNF would be best at this time.   Emotional  Assessment Appearance:  Appears older than stated age Attitude/Demeanor/Rapport:  Other (Cooperative) Affect (typically observed):  Restless Orientation:  Oriented to Self, Oriented to Place, Oriented to  Time Alcohol / Substance use:  Not Applicable Psych involvement (Current and /or in the community):  No (Comment)  Discharge Needs  Concerns to be addressed:  Discharge Planning Concerns Readmission within the last 30 days:  No Current discharge risk:  Physical Impairment Barriers to Discharge:  Continued Medical Work up   [employer], [employer], [place] 02/15/2016, 2:12 PM [phone]

## 2016-02-15 NOTE — NC FL2 (Signed)
[place] LEVEL OF CARE SCREENING TOOL     IDENTIFICATION  Patient Name: Oscar Rose Birthdate: 03-30-1961 Sex: male Admission Date (Current Location): 02/13/2016  [name] and [place] Number:  [name] [name] and Address:  [name] [name], [name]      Provider Number: [id number]  Attending Physician Name and Address:  Koleen Nimrod Acost*  Relative Name and Phone Number:       Current Level of Care: Hospital Recommended Level of Care: [place] Prior Approval Number:    Date Approved/Denied:   PASRR Number: [id number]  Discharge Plan: SNF    Current Diagnoses: Patient Active Problem List   Diagnosis Date Noted  . Cerebral infarction due to unspecified mechanism   . Essential hypertension   . CVA (cerebral infarction) 02/13/2016  . Dehydration 02/13/2016  . Fall at home 02/13/2016  . Hyponatremia 02/13/2016  . Encephalopathy 02/13/2016  . Hypertension   . Type 2 diabetes mellitus ([place])   . Legal blindness of left eye, as defined in [place]   . CKD (chronic kidney disease), stage II   . Renal stone 08/16/2014  . Precordial pain 03/23/2014  . Impingement syndrome of shoulder 02/02/2013    Orientation RESPIRATION BLADDER Height & Weight     Self, Time, Place  Normal Continent Weight: 300 lb (136.079 kg) Height:     BEHAVIORAL SYMPTOMS/MOOD NEUROLOGICAL BOWEL NUTRITION STATUS  Other (Comment) (n/a)  (n/a) Continent Diet (Carb modified)  AMBULATORY STATUS COMMUNICATION OF NEEDS Skin   Limited Assist Verbally Other (Comment) (Abrasion to arm/hand)                       Personal Care Assistance Level of Assistance  Bathing, Feeding, Dressing Bathing Assistance: Limited assistance Feeding assistance: Limited assistance Dressing Assistance: Limited assistance     Functional Limitations Info  Sight, Hearing, Speech Sight Info: Adequate Hearing Info: Adequate Speech  Info: Adequate    SPECIAL CARE FACTORS FREQUENCY  OT (By licensed OT)     PT Frequency: 5 OT Frequency: 5            Contractures Contractures Info: Not present    Additional Factors Info  Insulin Sliding Scale Code Status Info: Full code Allergies Info: No known allergies           Current Medications (02/15/2016):  This is the current hospital active medication list Current Facility-Administered Medications  Medication Dose Route Frequency Provider Last Rate Last Dose  . 0.9 %  sodium chloride infusion   Intravenous Continuous Phillips Grout, MD 75 mL/hr at 02/14/16 1735    . amoxicillin (AMOXIL) capsule 500 mg  500 mg Oral TID Phillips Grout, MD   500 mg at 02/15/16 [id number]  . aspirin suppository 300 mg  300 mg Rectal Daily Phillips Grout, MD       Or  . aspirin tablet 325 mg  325 mg Oral Daily Phillips Grout, MD   325 mg at 02/15/16 [id number]  . atorvastatin (LIPITOR) tablet 40 mg  40 mg Oral q1800 Reyne Dumas, MD   40 mg at 02/14/16 1736  . gemfibrozil (LOPID) tablet 600 mg  600 mg Oral BID AC Reyne Dumas, MD   600 mg at 02/15/16 0832  . insulin aspart (novoLOG) injection 0-9 Units  0-9 Units Subcutaneous TID WC Phillips Grout, MD   1 Units at 02/14/16 1735  . insulin glargine (LANTUS) injection 100  Units  100 Units Subcutaneous QHS Phillips Grout, MD   100 Units at 02/14/16 2337  . losartan (COZAAR) tablet 25 mg  25 mg Oral Daily Reyne Dumas, MD   25 mg at 02/15/16 0831     Discharge Medications: Please see discharge summary for a list of discharge medications.  Relevant Imaging Results:  Relevant Lab Results:   Additional Information SSN: [id number]  [name], [name]

## 2016-02-15 NOTE — Progress Notes (Signed)
EEG completed; results pending.    

## 2016-02-16 LAB — GLUCOSE, CAPILLARY
GLUCOSE-CAPILLARY: 154 mg/dL — AB (ref 65–99)
Glucose-Capillary: 63 mg/dL — ABNORMAL LOW (ref 65–99)
Glucose-Capillary: 134 mg/dL — ABNORMAL HIGH (ref 65–99)

## 2016-02-16 LAB — HIV ANTIBODY (ROUTINE TESTING W REFLEX): HIV SCREEN 4TH GENERATION: NONREACTIVE

## 2016-02-16 LAB — RPR: RPR Ser Ql: NONREACTIVE

## 2016-02-16 LAB — HOMOCYSTEINE: Homocysteine: 10.4 umol/L (ref 0.0–15.0)

## 2016-02-16 MED ORDER — LOSARTAN POTASSIUM 25 MG PO TABS: 25.0000 mg | ORAL_TABLET | Freq: Every day | ORAL | Status: DC

## 2016-02-16 MED ORDER — LEVETIRACETAM 250 MG PO TABS: 250.0000 mg | ORAL_TABLET | Freq: Two times a day (BID) | ORAL | Status: DC

## 2016-02-16 MED ORDER — ATORVASTATIN CALCIUM 40 MG PO TABS: 40.0000 mg | ORAL_TABLET | Freq: Every day | ORAL | Status: DC

## 2016-02-16 NOTE — Care Management Important Message (Signed)
Important Message  Patient Details  Name: Oscar Rose MRN: [id number] Date of Birth: Dec 28, 1960   Medicare Important Message Given:  Yes    Sherald Barge, RN 02/16/2016, 9:33 AM

## 2016-02-16 NOTE — Clinical Social Work Placement (Signed)
   CLINICAL SOCIAL WORK PLACEMENT  NOTE  Date:  02/16/2016  Patient Details  Name: Oscar Rose MRN: [id number] Date of Birth: 03-21-1961  Clinical Social Work is seeking post-discharge placement for this patient at the [place] level of care (*CSW will initial, date and re-position this form in  chart as items are completed):  Yes   Patient/family provided with [place] Work Department's list of facilities offering this level of care within the geographic area requested by the patient (or if unable, by the patient's family).  Yes   Patient/family informed of their freedom to choose among providers that offer the needed level of care, that participate in Medicare, Medicaid or managed care program needed by the patient, have an available bed and are willing to accept the patient.  Yes   Patient/family informed of [facility]'s ownership interest in [facility] and [facility], as well as of the fact that they are under no obligation to receive care at these facilities.  PASRR submitted to EDS on 02/15/16     PASRR number received on 02/15/16     Existing PASRR number confirmed on       FL2 transmitted to all facilities in geographic area requested by pt/family on 02/15/16     FL2 transmitted to all facilities within larger geographic area on       Patient informed that his/her managed care company has contracts with or will negotiate with certain facilities, including the following:        Yes   Patient/family informed of bed offers received.  Patient chooses bed at [facility]     Physician recommends and patient chooses bed at      Patient to be transferred to [facility] on 02/16/16.  Patient to be transferred to facility by [facility]     Patient family notified on 02/16/16 of transfer.  Name of family member notified:  April- friend     PHYSICIAN       Additional Comment:     _______________________________________________ [name], [place] 02/16/2016, 12:00 PM [phone]

## 2016-02-16 NOTE — Progress Notes (Signed)
Occupational Therapy Treatment Patient Details Name: Oscar Rose MRN: [id number] DOB: 12/30/1960 Today's Date: 02/16/2016    History of present illness 55 yo M admitted 02/13/2016 s/p fall recent sinus infection with bad HA, numbness/tingling and weakness of his L UE.  Head CT with questionable infarct.   MRI Several areas of acute/early subacute infarction within the right MCA distribution as described corresponding to areas of hypoattenuation on the prior CT of the head. No evidence of intracranial hemorrhage.  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 Pt agreeable to CVA diagnosis today, also aware that he is seeing "things" that may or may not be there such as "things hanging from the ceiling like the builders needed to take up space." Pt declined to participate in ADL tasks as "there is nothing I need to do." Session focused on LUE strengthening and coordination activities. Pt with slightly ataxic movement of LUE during exercises, reports mild fatigue. Pt demonstrates improved opposition since evaluation, however has mod-max difficulty isolating digits during digit lifts (finger taps). Discharge to SNF continues to be appropriate to improve safety and independence during ADL and functional mobility tasks.    Follow Up Recommendations  SNF;Supervision/Assistance - 24 hour    Equipment Recommendations  None recommended by OT       Precautions / Restrictions Precautions Precautions: Fall Restrictions Weight Bearing Restrictions: No         Cognition   Behavior During Therapy: WFL for tasks assessed/performed Overall Cognitive Status: No family/caregiver present to determine baseline cognitive functioning Area of  Impairment: Safety/judgement;Awareness     Memory: Decreased short-term memory;Decreased recall of precautions    Safety/Judgement: Decreased awareness of safety;Decreased awareness of deficits     General Comments: Pt now believes he has "something going on with my brain"      Exercises General Exercises - Upper Extremity Shoulder Flexion: AROM;10 reps Shoulder Horizontal ABduction: AROM;10 reps Shoulder Horizontal ADduction: AROM;10 reps Elbow Flexion: AROM;10 reps Elbow Extension: AROM;10 reps Hand Exercises Digit Composite Abduction: AROM;10 reps Digit Composite Adduction: AROM;10 reps Digit Lifts: AROM;10 reps Digit Lifts Limitations: Max difficulty isolating digits on LUE, required verbal and visual cuing for correct completion Opposition: AROM;10 reps           Pertinent Vitals/ Pain       Pain Assessment: No/denies pain         Frequency Min 2X/week     Progress Toward Goals  OT Goals(current goals can now be found in the care plan section)  Progress towards OT goals: Progressing toward goals  Acute Rehab OT Goals Patient Stated Goal: To go home OT Goal Formulation: With patient Time For Goal Achievement: 02/28/16 Potential to Achieve Goals: Good ADL Goals Pt Will Perform Grooming: with supervision;standing Pt Will Transfer to Toilet: with supervision;regular height toilet;ambulating Pt/caregiver will Perform Home Exercise Program: Increased strength;Left upper extremity;Independently;With written HEP provided  Plan Discharge plan remains appropriate       End of Session     Activity Tolerance Patient tolerated treatment well   Patient Left in bed;with call bell/phone within reach;with bed alarm set           Time: [id number] OT Time Calculation (min): 26 min  Charges: OT General Charges $OT  Visit: 1 Procedure OT Treatments $Therapeutic Exercise: 23-37 mins  Guadelupe Sabin, OTR/L  [phone]  02/16/2016, 9:10 AM

## 2016-02-16 NOTE — Progress Notes (Signed)
Hypoglycemic Event  CBG: 63  Treatment: 15 GM carbohydrate snack  Symptoms: None  Follow-up CBG: [id number] CBG Result:137  Possible Reasons for Event: Unknown      Oswald Hillock

## 2016-02-16 NOTE — Progress Notes (Addendum)
Discharged PT per MD order and protocol. Pt verbalized understanding and left with all belongings. VSS. IV catheter D/C. Report called to [facility] at [facility].  Patient taken out by [facility] facility workers. Oswald Hillock, RN

## 2016-02-16 NOTE — Progress Notes (Addendum)
Inpatient Diabetes Program Recommendations  AACE/ADA: New Consensus Statement on Inpatient Glycemic Control (2015)  Target Ranges:  Prepandial:   less than 140 mg/dL      Peak postprandial:   less than 180 mg/dL (1-2 hours)      Critically ill patients:  140 - 180 mg/dL   Results for Oscar Rose, Oscar Rose (MRN [id number]) as of 02/16/2016 08:23  Ref. Range 02/15/2016 07:45 02/15/2016 11:45 02/15/2016 16:36 02/15/2016 21:41 02/16/2016 08:01  Glucose-Capillary Latest Ref Range: 65-99 mg/dL 103 (H) 98 92 161 (H) 63 (L)   Review of Glycemic Control  Diabetes history: DM2 Outpatient Diabetes medications: Lantus 100 units QHS, Glipizide 20 mg QHS Current orders for Inpatient glycemic control: Lantus 100 units QHS, Novolog 0-9 units TID with meals  Insulin - Basal: Per chart review patient has poor PO intake documented. Fasting glucose 60 mg/dl this morning. May want to consider decreasing Lantus to 95 units QHS.  Thanks, Barnie Alderman, RN, MSN, CDE Diabetes Coordinator Inpatient Diabetes Program [phone] (Team Pager from [place] to [place]) [phone] ([place] office) [phone] [facility] office) [phone] [facility] office)

## 2016-02-16 NOTE — Discharge Summary (Signed)
Physician Discharge Summary  Oscar Rose [id number] DOB: 05-16-1961 DOA: 02/13/2016  PCP: Sherrie Mustache, MD  Admit date: 02/13/2016 Discharge date: 02/16/2016  Time spent: 45 minutes  Recommendations for Outpatient Follow-up:  -Will be discharged to SNF today. -We will schedule appointment with vascular surgery, Dr. Scot Dock within the next week for evaluation and repair of his right critical ICA stenosis.   Discharge Diagnoses:  Principal Problem:   Cerebral infarction due to unspecified mechanism Active Problems:   Dehydration   Fall at home   Hyponatremia   Encephalopathy   Essential hypertension   Discharge Condition: Stable and improved  Filed Weights   02/14/16 0146  Weight: 136.079 kg (300 lb)    History of present illness:  As per Dr. Shanon Brow on 7/12: Oscar Rose is a 55 y.o. male with medical history significant of DM, CKD, depression comes in after falling today, his friends checked on him and insisted he come to the ED and they called 34. Pt says he was diagnosed with a sinus in the last couple of days. He has been having a bad headache for over 2 days associated with some numbness, tingling and weakness of his left arm. He says his arm weakness has gotten better however. He denies any weakness, numbness or tingling in his legs but he does have diabetic neuropathy in his feet chronically and this is why he fell today per his report. Denies any fevers. Just hasnt been feeling "right". Some nausea. Ct head shows a questionable infarct, pt referred for admission for possible stroke.  Hospital Course:   Multiple embolic right brain CVA -Workup has shown symptomatic right carotid high-grade stenosis. -This has been discussed with Deitra Mayo, vascular surgeon. Will need to be seen soon in follow-up to plan for carotid endarterectomy. Okay to continue aspirin. -Has also been advised on risk factor modification, continue aspirin for  secondary stroke prevention. -2-D echocardiogram: Impressions:  - Mild to moderate LVH with LVEF 55-60%. Grade 2 diastolic  dysfunction with increased LV filling pressure. Upper normal left  atrial chamber size. Trivial mitral regurgitation. Mildly ectatic  aortic root. Trivial tricuspid regurgitation. No obvious PFO or  ASD. -Appreciate neurology input and recommendations. -On EEG: This recording is abnormal for the following reasons: 1. Frequent right temporal slowing indicating a right temporal cerebral disturbance. However, focal slowing can also be associated with the epileptic seizures. 2. 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 3. Right hemispheric slowing indicating a right hemispheric cerebral disturbance. This is most likely due to the underlying stroke. 4. Overall mild global slowing indicating a mild global encephalopathy.  -Given the patient's clinical history and the above findings, he will be started on low-dose Keppra for seizures.  Hyperlipidemia -Continue statin   Hypertension -Continue Cozaar, fair control.  Stage II chronic kidney disease -Creatinine remains at baseline of around 1.5.   Type 2 diabetes -Well-controlled, continue current regimen  Procedures:  EEG as above   ECHO as above  Carotid Dopplers as above  Consultations:  Neurology  Discharge Instructions  Discharge Instructions    Diet - low sodium heart healthy    Complete by:  As directed      Increase activity slowly    Complete by:  As directed             Medication List    STOP taking these medications        amoxicillin 500 MG capsule  Commonly known as:  AMOXIL  pravastatin 40 MG tablet  Commonly known as:  PRAVACHOL     TRILIPIX 135 MG capsule  Generic drug:  Choline Fenofibrate      TAKE these medications        acetaminophen 500 MG tablet  Commonly known as:  TYLENOL  Take 1,000 mg by mouth every 6 (six) hours as needed for mild  pain or moderate pain.     atorvastatin 40 MG tablet  Commonly known as:  LIPITOR  Take 1 tablet (40 mg total) by mouth daily at 6 PM.     glipiZIDE 10 MG 24 hr tablet  Commonly known as:  GLUCOTROL XL  Take 20 mg by mouth at bedtime.     insulin glargine 100 UNIT/ML injection  Commonly known as:  LANTUS  Inject 100 Units into the skin at bedtime.     levETIRAcetam 250 MG tablet  Commonly known as:  KEPPRA  Take 1 tablet (250 mg total) by mouth 2 (two) times daily.     losartan 25 MG tablet  Commonly known as:  COZAAR  Take 1 tablet (25 mg total) by mouth daily.       No Known Allergies     Follow-up Information    Follow up with Sherrie Mustache, MD. Schedule an appointment as soon as possible for a visit in 2 weeks.   Specialty:  Family Medicine   Contact information:   [place] [address] [phone]       Follow up with Deitra Mayo, MD. Schedule an appointment as soon as possible for a visit in 1 week.   Specialties:  Vascular Surgery, Cardiology   Contact information:   [address] [place] [address] [phone]        The results of significant diagnostics from this hospitalization (including imaging, microbiology, ancillary and laboratory) are listed below for reference.    Significant Diagnostic Studies: Ct Head Wo Contrast  02/13/2016  CLINICAL DATA:  Fall yesterday, head pain EXAM: CT HEAD WITHOUT CONTRAST TECHNIQUE: Contiguous axial images were obtained from the base of the skull through the vertex without intravenous contrast. COMPARISON:  None. FINDINGS: No skull fracture is noted. Paranasal sinuses and mastoid air cells are unremarkable. No intracranial hemorrhage, mass effect or midline shift. Axial image twenty-five and 26 there is subtle area of cortical decreased attenuation in right frontal lobe. Subacute or evolving infarct cannot be excluded. Clinical correlation is necessary. Further correlation with MRI  with diffusion imaging is recommended as clinically warranted. No mass lesion is noted on this unenhanced scan. No hydrocephalus. No intraventricular hemorrhage. There is focal decreased attenuation in right caudate nucleus measures about 5 mm. Also right temporal lobe axial image 14 This may represent infarct of indeterminate age. IMPRESSION: No intracranial hemorrhage, mass effect or midline shift. Axial image twenty-five and 26 there is subtle area of cortical decreased attenuation in right frontal lobe. Subacute or evolving infarct cannot be excluded. Further correlation with MRI is recommended as clinically warranted. There is focal decreased attenuation in right caudate nucleus measures about 5 mm. Also right temporal lobe axial image 14. This may represent infarct of indeterminate age. These results were called by telephone at the time of interpretation on 02/13/2016 at 7:36 pm to Dr. Nat Christen , who verbally acknowledged these results. Electronically Signed   By: Lahoma Crocker M.D.   On: 02/13/2016 19:38   Mr Virgel Paling Wo Contrast  02/14/2016  CLINICAL DATA:  55 year old male with 2 days of  altered mental status, weakness, inability to walk. Patient fell yesterday and does not remember falling. EXAM: MRI HEAD WITHOUT CONTRAST MRA HEAD WITHOUT CONTRAST TECHNIQUE: Multiplanar, multiecho pulse sequences of the brain and surrounding structures were obtained without intravenous contrast. Angiographic images of the head were obtained using MRA technique without contrast. COMPARISON:  CT of the head 02/13/2016. FINDINGS: MRI HEAD FINDINGS Brain: There are several areas of diffusion restriction within the right MCA distribution within the right anterolateral frontal lobe, scattered small foci in the right parietal lobe, within the right posterior temporal lobe, at the right temporo-occipital junction, and small foci within the right caudate nucleus head and body compatible with acute/early subacute infarctions. No  additional focus of diffusion restriction is identified elsewhere in the brain parenchyma. There is T2 FLAIR hyperintense signal abnormality and slight focal mass effect associated with the areas of infarction. No abnormal susceptibility hyperintensity to indicate intracranial hemorrhage. Extra-axial space: Normal ventricular size. No hydrocephalus or midline shift. No effacement of basilar cisterns. No extra-axial collection is identified. Proximal intracranial flow voids are maintained. No abnormality of the cervical medullary junction. Other: Partial opacification of the left sphenoid sinus, otherwise the visualized paranasal sinuses demonstrate normal signal. No abnormal signal of the mastoid air cells. Inner ear structures are grossly unremarkable. Orbits are unremarkable. Calvarium is unremarkable. MRA HEAD FINDINGS Internal carotid arteries:  Patent. Anterior cerebral arteries:  Patent. Middle cerebral arteries: Patent. Anterior communicating artery: Patent. Posterior communicating arteries:  Patent. Posterior cerebral arteries: Patent. Small P1 segments with large posterior communicating arteries compatible with persistent fetal circulation, normal variant. Basilar artery:  Patent. Vertebral arteries:  Patent. No evidence of high-grade stenosis, large vessel occlusion, or aneurysm unless noted above. IMPRESSION: 1. Several areas of acute/early subacute infarction within the right MCA distribution as described corresponding to areas of hypoattenuation on the prior CT of the head. No evidence of intracranial hemorrhage. 2. No large vessel occlusion, high-grade stenosis, or aneurysm of the circle of Willis is identified. Electronically Signed   By: Kristine Garbe M.D.   On: 02/14/2016 11:42   Mr Brain Wo Contrast  02/14/2016  CLINICAL DATA:  55 year old male with 2 days of altered mental status, weakness, inability to walk. Patient fell yesterday and does not remember falling. EXAM: MRI HEAD  WITHOUT CONTRAST MRA HEAD WITHOUT CONTRAST TECHNIQUE: Multiplanar, multiecho pulse sequences of the brain and surrounding structures were obtained without intravenous contrast. Angiographic images of the head were obtained using MRA technique without contrast. COMPARISON:  CT of the head 02/13/2016. FINDINGS: MRI HEAD FINDINGS Brain: There are several areas of diffusion restriction within the right MCA distribution within the right anterolateral frontal lobe, scattered small foci in the right parietal lobe, within the right posterior temporal lobe, at the right temporo-occipital junction, and small foci within the right caudate nucleus head and body compatible with acute/early subacute infarctions. No additional focus of diffusion restriction is identified elsewhere in the brain parenchyma. There is T2 FLAIR hyperintense signal abnormality and slight focal mass effect associated with the areas of infarction. No abnormal susceptibility hyperintensity to indicate intracranial hemorrhage. Extra-axial space: Normal ventricular size. No hydrocephalus or midline shift. No effacement of basilar cisterns. No extra-axial collection is identified. Proximal intracranial flow voids are maintained. No abnormality of the cervical medullary junction. Other: Partial opacification of the left sphenoid sinus, otherwise the visualized paranasal sinuses demonstrate normal signal. No abnormal signal of the mastoid air cells. Inner ear structures are grossly unremarkable. Orbits are unremarkable. Calvarium is unremarkable. MRA HEAD  FINDINGS Internal carotid arteries:  Patent. Anterior cerebral arteries:  Patent. Middle cerebral arteries: Patent. Anterior communicating artery: Patent. Posterior communicating arteries:  Patent. Posterior cerebral arteries: Patent. Small P1 segments with large posterior communicating arteries compatible with persistent fetal circulation, normal variant. Basilar artery:  Patent. Vertebral arteries:  Patent.  No evidence of high-grade stenosis, large vessel occlusion, or aneurysm unless noted above. IMPRESSION: 1. Several areas of acute/early subacute infarction within the right MCA distribution as described corresponding to areas of hypoattenuation on the prior CT of the head. No evidence of intracranial hemorrhage. 2. No large vessel occlusion, high-grade stenosis, or aneurysm of the circle of Willis is identified. Electronically Signed   By: Kristine Garbe M.D.   On: 02/14/2016 11:42   US Carotid Bilateral  02/14/2016  CLINICAL DATA:  Altered mental status, headache, syncope with fall, left arm numbness. Hyperlipidemia, diabetes. EXAM: BILATERAL CAROTID DUPLEX ULTRASOUND TECHNIQUE: Pearline Cables scale imaging, color Doppler and duplex ultrasound was performed of bilateral carotid and vertebral arteries in the neck. COMPARISON:  None. TECHNIQUE: Quantification of carotid stenosis is based on velocity parameters that correlate the residual internal carotid diameter with NASCET-based stenosis levels, using the diameter of the distal internal carotid lumen as the denominator for stenosis measurement. The following velocity measurements were obtained: PEAK SYSTOLIC/END DIASTOLIC RIGHT ICA:                     367/96cm/sec CCA:                     [id number] SYSTOLIC ICA/CCA RATIO:  3.1 DIASTOLIC ICA/CCA RATIO: 4.5 ECA:                     168cm/sec LEFT ICA:                     120/43cm/sec CCA:                     [id number] SYSTOLIC ICA/CCA RATIO:  1.4 DIASTOLIC ICA/CCA RATIO: 1.7 ECA:                     210cm/sec FINDINGS: RIGHT CAROTID ARTERY: Partial calcified plaque and effaces the carotid bulb and extends into the proximal ICA resulting in at least moderate stenosis. Elevated peak systolic velocities are recorded in the proximal internal and external iliac arteries just distal to the plaque. RIGHT VERTEBRAL ARTERY:  Normal flow direction and waveform. LEFT CAROTID ARTERY: Mild circumferential put scratch the  circumferential plaque in the carotid bulb and proximal ICA resulting in at least mild stenosis. Normal waveforms and color Doppler signal. LEFT VERTEBRAL ARTERY: Normal flow direction and waveform. IMPRESSION: 1. Bilateral carotid bifurcation and proximal ICA plaque, resulting in 70-99% diameter right ICA stenosis, less than 50% diameter left ICA stenosis. Consider vascular surgical or neurointerventional radiology consultation. 2.  Antegrade bilateral vertebral arterial flow. Electronically Signed   By: Lucrezia Europe M.D.   On: 02/14/2016 11:12   Dg Chest Port 1 View  02/13/2016  CLINICAL DATA:  Weakness, hypertension, diabetes mellitus, GERD EXAM: PORTABLE CHEST 1 VIEW COMPARISON:  Portable exam 1611 hours compared 08/16/2014 FINDINGS: Lordotic positioning. Low lung volumes. Upper normal heart size. Mediastinal contours and pulmonary vascularity normal. No gross infiltrate, pleural effusion or pneumothorax. IMPRESSION: Low lung volumes without definite infiltrate. Electronically Signed   By: Lavonia Dana M.D.   On: 02/13/2016 16:24    Microbiology: No results found for this  or any previous visit (from the past 240 hour(s)).   Labs: Basic Metabolic Panel:  Recent Labs Lab 02/13/16 1549 02/14/16 0554  NA 131* 134*  K 3.9 3.8  CL 99* 101  CO2 22 24  GLUCOSE 227* 166*  BUN 25* 27*  CREATININE 1.47* 1.40*  CALCIUM 8.3* 8.3*   Liver Function Tests:  Recent Labs Lab 02/13/16 1549 02/14/16 0554  AST 22 19  ALT 22 21  ALKPHOS 71 69  BILITOT 1.0 0.9  PROT 6.6 6.7  ALBUMIN 3.3* 3.4*    Recent Labs Lab 02/13/16 1549  LIPASE 13   No results for input(s): AMMONIA in the last 168 hours. CBC:  Recent Labs Lab 02/13/16 1549 02/15/16 0512  WBC 12.0* 8.4  NEUTROABS 9.6*  --   HGB 13.3 13.9  HCT 39.0 41.0  MCV 86.5 87.6  PLT 215 214   Cardiac Enzymes:  Recent Labs Lab 02/13/16 1606  TROPONINI 0.05*   BNP: BNP (last 3 results) No results for input(s): BNP in the last 8760  hours.  ProBNP (last 3 results) No results for input(s): PROBNP in the last 8760 hours.  CBG:  Recent Labs Lab 02/15/16 1145 02/15/16 1636 02/15/16 2141 02/16/16 0801 02/16/16 0850  GLUCAP 98 92 161* 63* 134*       Signed:  [facility] Pager: [phone] 02/16/2016, 11:45 AM

## 2016-02-27 IMAGING — CR DG CHEST 2V
2 series · 2 of 2 positions shown · non-contrast
Comparison: None.

CLINICAL DATA: Preop for percutaneous nephrolithotomy

EXAM:
CHEST  2 VIEW

[w chest pa]
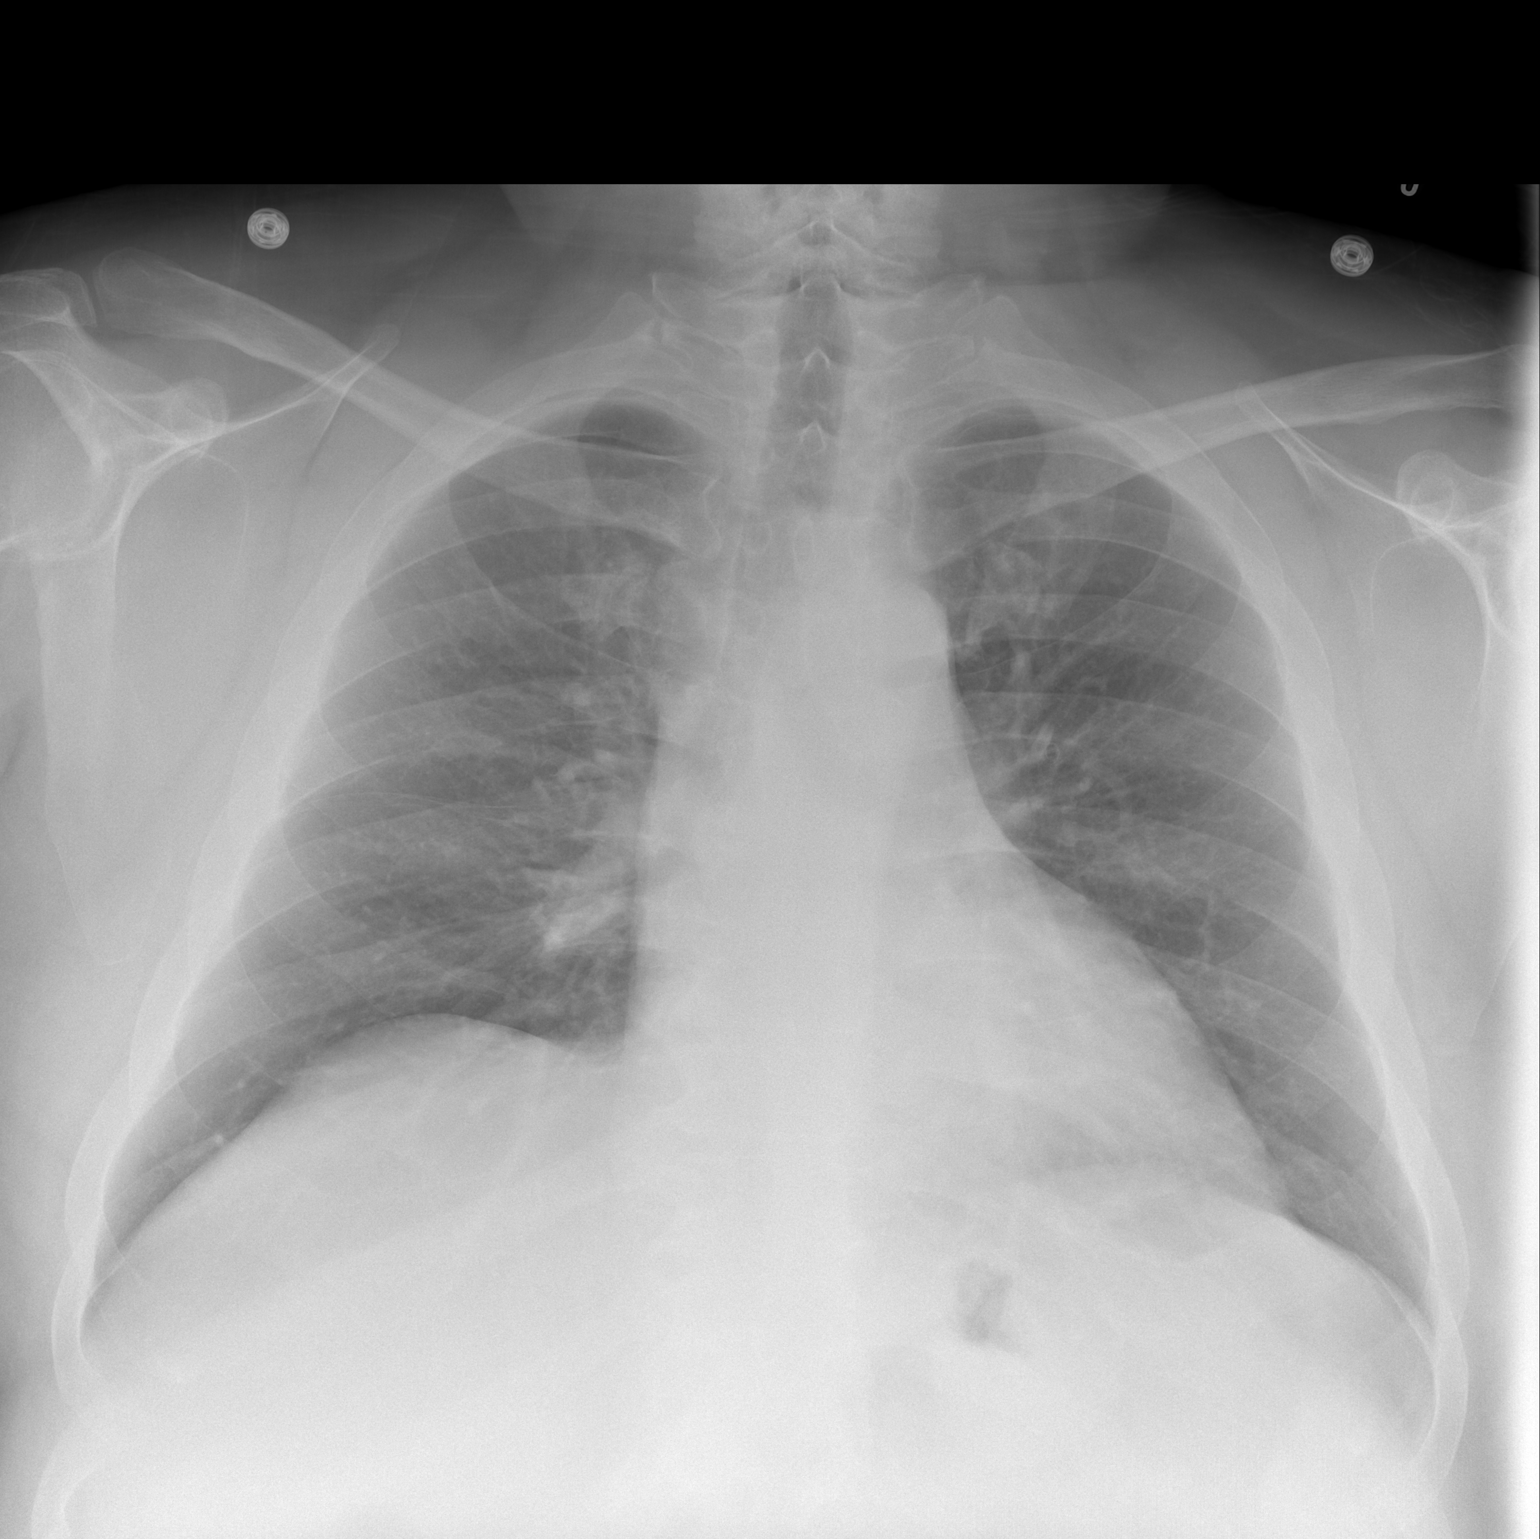

[w chest lat]
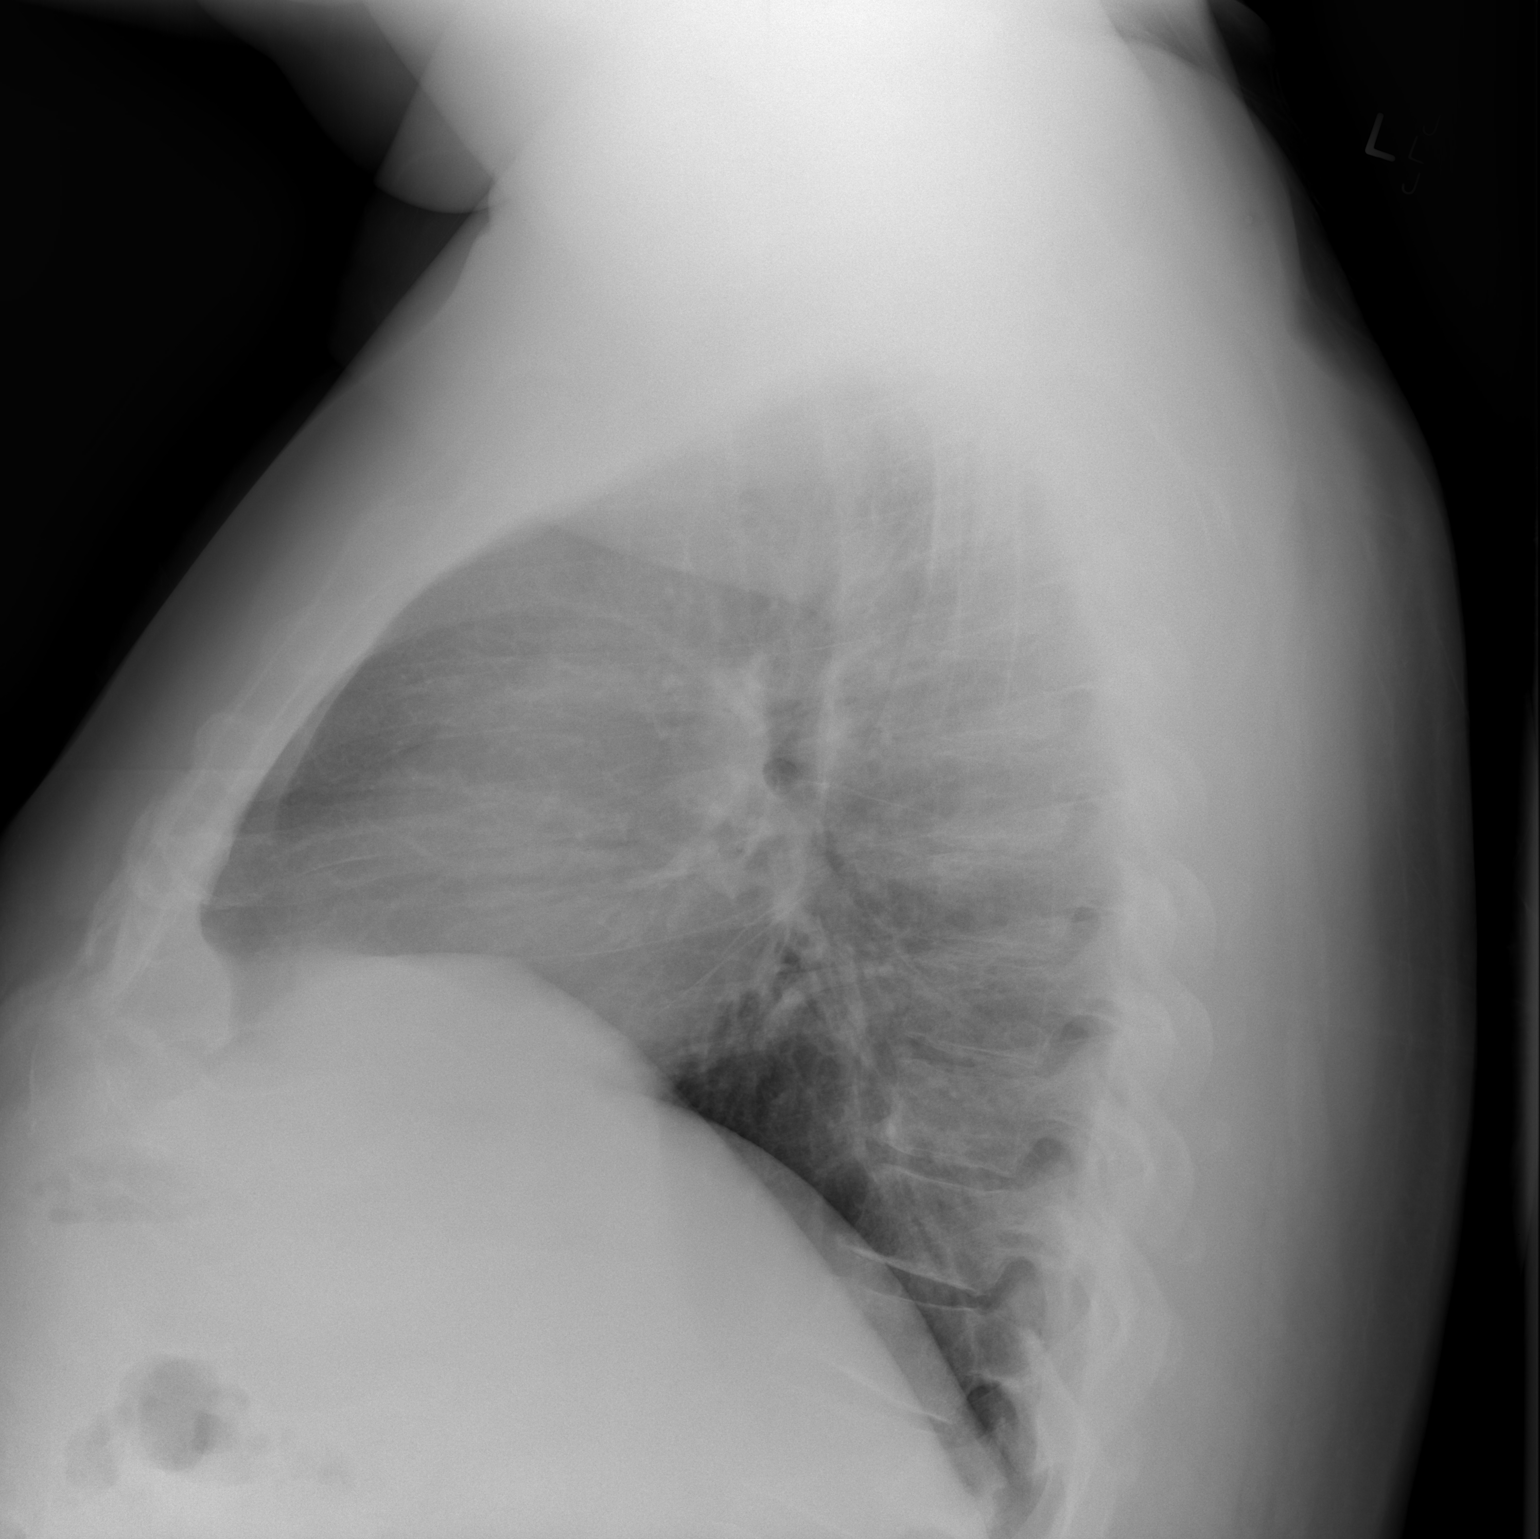

[2 of 2 positions shown; findings below may reference images not displayed]

FINDINGS: The heart size and mediastinal contours are within normal limits.
Both lungs are clear. The visualized skeletal structures are
unremarkable.
IMPRESSION: No active cardiopulmonary disease.

## 2016-02-27 IMAGING — RF DG ABDOMEN 1V
1 series · 3 of 3 positions shown · non-contrast
Comparison: 07/07/2014.

CLINICAL DATA: Left percutaneous nephrolithotomy.

EXAM:
ABDOMEN - 1 VIEW

[Series 1: run · 3 of 3 slices shown]
[im 1/3]
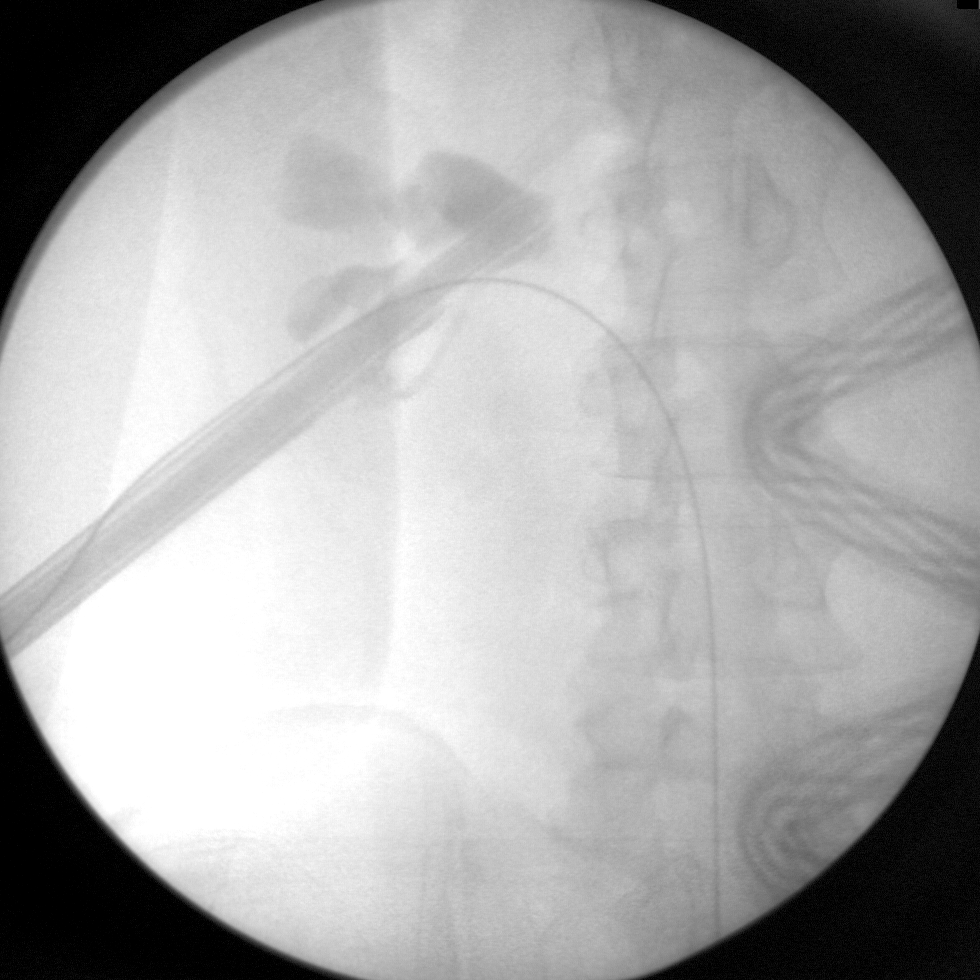
[im 2/3]
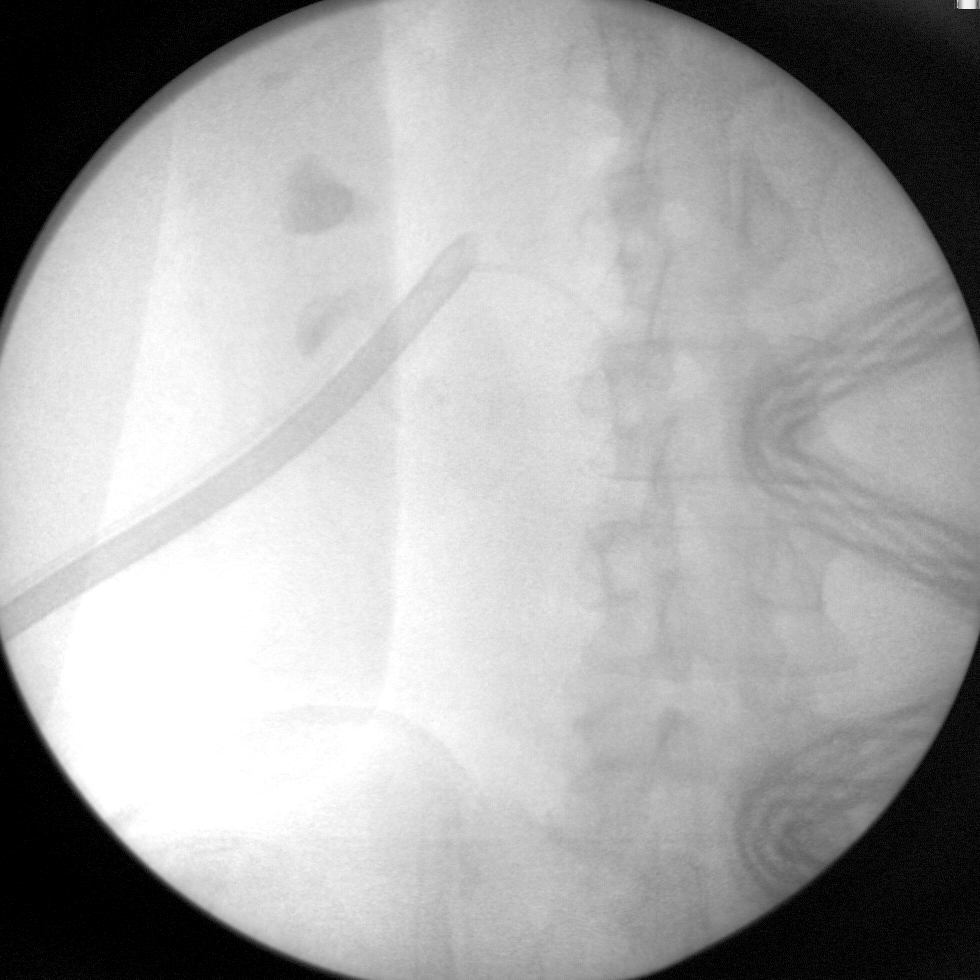
[im 3/3]
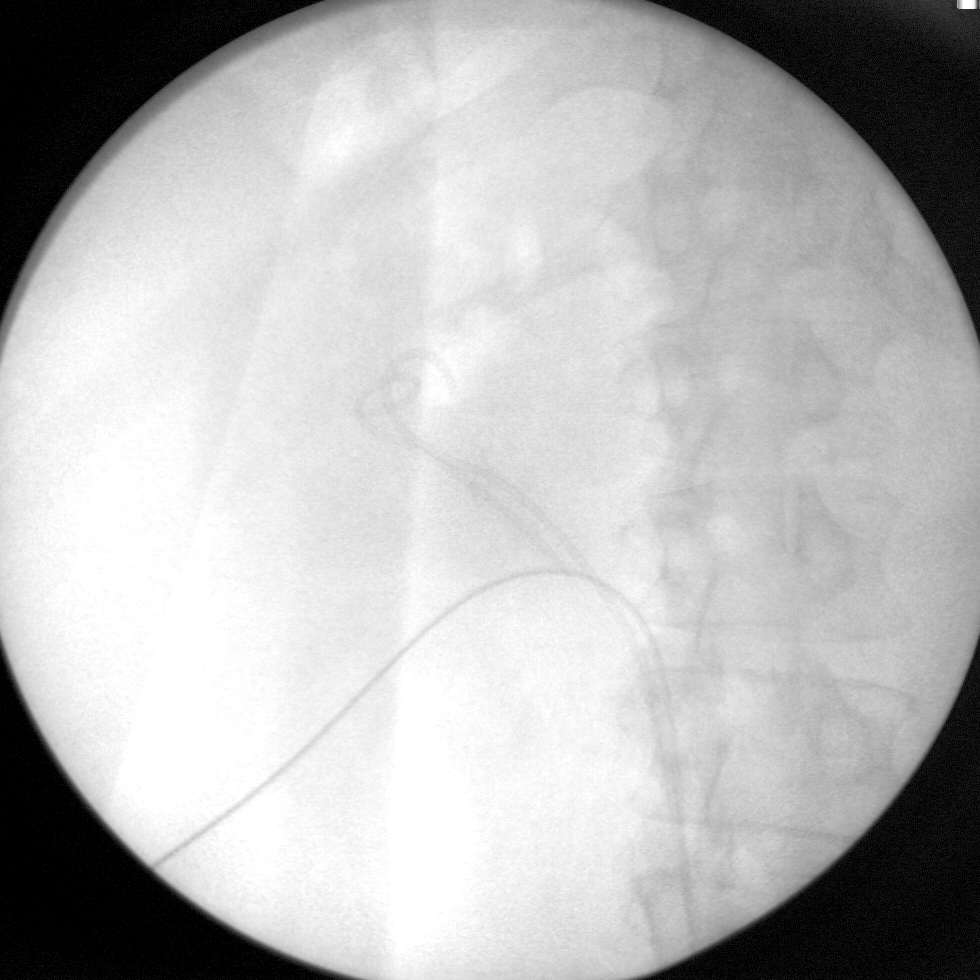

[3 of 3 positions shown; findings below may reference images not displayed]

FINDINGS: Left ureteral stent and stent fragment noted. Guidewire noted. Left
nephrolithiasis. Percutaneous renal sheath noted. Contrast in the
collecting system.
IMPRESSION: Intraoperative findings as above.

## 2016-03-22 ENCOUNTER — Other Ambulatory Visit: Payer: Self-pay | Admitting: *Deleted

## 2016-03-22 DIAGNOSIS — I6523 Occlusion and stenosis of bilateral carotid arteries: Secondary | ICD-10-CM

## 2016-03-25 ENCOUNTER — Ambulatory Visit (HOSPITAL_COMMUNITY)
Admission: RE | Admit: 2016-03-25 | Discharge: 2016-03-25 | Disposition: A | Discharge status: Home / Self Care | Payer: Medicare Other | Source: Ambulatory Visit | Attending: Surgery | Admitting: Surgery

## 2016-03-25 ENCOUNTER — Encounter (HOSPITAL_COMMUNITY): Payer: Medicare Other

## 2016-03-25 ENCOUNTER — Encounter: Payer: Self-pay | Admitting: Vascular Surgery

## 2016-03-25 DIAGNOSIS — F329 Major depressive disorder, single episode, unspecified: Secondary | ICD-10-CM | POA: Diagnosis not present

## 2016-03-25 DIAGNOSIS — K219 Gastro-esophageal reflux disease without esophagitis: Secondary | ICD-10-CM | POA: Diagnosis not present

## 2016-03-25 DIAGNOSIS — E1140 Type 2 diabetes mellitus with diabetic neuropathy, unspecified: Secondary | ICD-10-CM | POA: Diagnosis not present

## 2016-03-25 DIAGNOSIS — E785 Hyperlipidemia, unspecified: Secondary | ICD-10-CM | POA: Diagnosis not present

## 2016-03-25 DIAGNOSIS — N182 Chronic kidney disease, stage 2 (mild): Secondary | ICD-10-CM | POA: Insufficient documentation

## 2016-03-25 DIAGNOSIS — I129 Hypertensive chronic kidney disease with stage 1 through stage 4 chronic kidney disease, or unspecified chronic kidney disease: Secondary | ICD-10-CM | POA: Insufficient documentation

## 2016-03-25 DIAGNOSIS — E1122 Type 2 diabetes mellitus with diabetic chronic kidney disease: Secondary | ICD-10-CM | POA: Diagnosis not present

## 2016-03-25 DIAGNOSIS — I6523 Occlusion and stenosis of bilateral carotid arteries: Secondary | ICD-10-CM

## 2016-03-25 LAB — VAS US CAROTID
RIGHT CCA MID DIAS: 14 cm/s
RIGHT ECA DIAS: -8 cm/s
Right CCA prox dias: 9 cm/s
Right CCA prox sys: 80 cm/s
Right cca dist sys: -75 cm/s

## 2016-03-26 ENCOUNTER — Encounter (HOSPITAL_COMMUNITY): Payer: Medicare Other

## 2016-03-27 ENCOUNTER — Other Ambulatory Visit: Payer: Self-pay | Admitting: *Deleted

## 2016-03-27 ENCOUNTER — Encounter: Payer: Self-pay | Admitting: Vascular Surgery

## 2016-03-27 ENCOUNTER — Encounter: Payer: Self-pay | Admitting: *Deleted

## 2016-03-27 ENCOUNTER — Ambulatory Visit (INDEPENDENT_AMBULATORY_CARE_PROVIDER_SITE_OTHER): Payer: Medicare Other | Admitting: Vascular Surgery

## 2016-03-27 VITALS — BP 132/85 | HR 91 | Temp 97.8°F | Resp 20 | Ht 70.0 in | Wt 295.0 lb

## 2016-03-27 DIAGNOSIS — I6523 Occlusion and stenosis of bilateral carotid arteries: Secondary | ICD-10-CM | POA: Diagnosis not present

## 2016-03-27 DIAGNOSIS — I6521 Occlusion and stenosis of right carotid artery: Secondary | ICD-10-CM

## 2016-03-27 NOTE — Progress Notes (Addendum)
Patient name: Oscar Rose MRN: [id number] DOB: Jun 06, 1961 Sex: male  REASON FOR CONSULT: Right carotid stenosis  HPI: Oscar Rose is a 55 y.o. male, who was referred as a new patient with a right carotid stenosis. The patient was admitted in July after he fell at home. Prior to falling he had been describing some intermittent episodes of left upper extremity weakness which would last several minutes and resolved. This had been going on for a week. Since his hospitalization he denies any new symptoms of weakness or paresthesias in his upper extremities or lower extremities. He denies any history of expressive or receptive aphasia or amaurosis fugax. He was not on aspirin prior to the event. He was on a statin. He has been started on aspirin.  I have reviewed the discharge summary from July of this year. The patient was admitted on 02/13/2016 with a stroke. The patient was discharged to a skilled nursing facility on 02/16/2016. The patient had fallen at home and that's why they were admitted to the hospital. Workup included the duplex scan which is described below and shows a significant right carotid stenosis. CT of the head showed no intracranial hemorrhage. Was a suspicion for a right frontal infarct. MRI showed several areas of acute and early subacute infarcts within the right middle cerebral artery distribution.  Past Medical History:  Diagnosis Date  . Arthritis   . At risk for sleep apnea    STOP-BANG= 7    SENT TO PCP 06-29-2014  . CKD (chronic kidney disease), stage II    montitored by nephrologist  . Depression       . Diabetic neuropathy ([place])   . GERD (gastroesophageal reflux disease)   . History of kidney stones   . History of retinal detachment   . Hyperlipidemia   . Hypertension   . 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 Restless leg syndrome   . Retained ureteral stent    w/ encrustation SINCE 2012  . Rotator cuff  syndrome of right shoulder   . Type 2 diabetes mellitus (H[facility])     Family History  Problem Relation Age of Onset  . Cancer Mother     Throat    SOCIAL HISTORY: Social History   Social History  . Marital status: Single    Spouse name: N/A  . Number of children: 0  . Years of education: N/A   Occupational History  . Not on file.   Social History Main Topics  . Smoking status: Never Smoker  . Smokeless tobacco: Never Used  . Alcohol use Yes     Comment: Occassionally  . Drug use: No  . Sexual activity: Not on file   Other Topics Concern  . Not on file   Social History Narrative   Lives with "wife"    No Known Allergies  Current Outpatient Prescriptions  Medication Sig Dispense Refill  . acetaminophen (TYLENOL) 500 MG tablet Take 1,000 mg by mouth every 6 (six) hours as needed for mild pain or moderate pain.    Marland Kitchen atorvastatin (LIPITOR) 40 MG tablet Take 1 tablet (40 mg total) by mouth daily at 6 PM.    . Choline Fenofibrate 135 MG capsule Take 135 mg by mouth daily.    Marland Kitchen glipiZIDE (GLUCOTROL XL) 10 MG 24 hr tablet Take 20 mg by mouth at bedtime.     . insulin glargine (LANTUS) 100 UNIT/ML injection  Inject 100 Units into the skin at bedtime.     Marland Kitchen losartan (COZAAR) 25 MG tablet Take 1 tablet (25 mg total) by mouth daily.    Marland Kitchen levETIRAcetam (KEPPRA) 250 MG tablet Take 1 tablet (250 mg total) by mouth 2 (two) times daily. (Patient not taking: Reported on 03/27/2016)     No current facility-administered medications for this visit.     REVIEW OF SYSTEMS:  [X]  denotes positive finding, [ ]  denotes negative finding Cardiac  Comments:  Chest pain or chest pressure: X The symptoms described sound like indigestion in that they're relieved with antiacids.  Shortness of breath upon exertion: X   Short of breath when lying flat:    Irregular heart rhythm:        Vascular    Pain in calf, thigh, or hip brought on by ambulation:    Pain in feet at night that wakes you up from  your sleep:     Blood clot in your veins:    Leg swelling:  X       Pulmonary    Oxygen at home:    Productive cough:     Wheezing:         Neurologic    Sudden weakness in arms or legs:     Sudden numbness in arms or legs:     Sudden onset of difficulty speaking or slurred speech:    Temporary loss of vision in one eye:     Problems with dizziness:         Gastrointestinal    Blood in stool:     Vomited blood:         Genitourinary    Burning when urinating:     Blood in urine:        Psychiatric    Major depression:         Hematologic    Bleeding problems:    Problems with blood clotting too easily:        Skin    Rashes or ulcers:        Constitutional    Fever or chills:      PHYSICAL EXAM: Vitals:   03/27/16 1020 03/27/16 1024  BP: 140/85 132/85  Pulse: 91   Resp: 20   Temp: 97.8 F (36.6 C)   TempSrc: Oral   SpO2: 99%   Weight: 295 lb (133.8 kg)   Height: 5\' 10"  (1.778 m)     GENERAL: The patient is a well-nourished male, in no acute distress. The vital signs are documented above. CARDIAC: There is a regular rate and rhythm.  VASCULAR: I do not detect carotid bruits. The right side, he has a palpable dorsalis pedis and posterior tibial pulse. On the left side he has a palpable dorsalis pedis pulse. I cannot palpate a posterior tibial pulse. Has mild bilateral lower extremity swelling. PULMONARY: There is good air exchange bilaterally without wheezing or rales. ABDOMEN: Soft and non-tender with normal pitched bowel sounds.  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 NEUROLOGIC: No focal weakness or paresthesias are detected. SKIN: There are no ulcers or rashes noted. PSYCHIATRIC: The patient has a normal affect.  DATA:   CAROTID DUPLEX: I have reviewed the carotid duplex scan that was done on 02/14/2016. This was interpreted as showing a greater than 70% right carotid  stenosis with a less than 50% left carotid stenosis. On the right, the end-diastolic velocity  was 96 cm/s suggesting that this is less than 80%.  LIMITED CAROTID DUPLEX: The patient had a right carotid duplex in our office on 03/25/2016. This showed evidence of a 60-79% right carotid stenosis. This was based on the end-diastolic velocity. By the peak systolic velocity the stenosis was greater than 80%. The stenosis was at the mid [place] level and there was a normal segment above the stenosis identified.  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 I have recommended right carotid endarterectomy. I have reviewed the indications for carotid endarterectomy, that is to lower the risk of future stroke. I have also reviewed the potential complications of surgery, including but not limited to: bleeding, stroke (perioperative risk 1-2%), MI, nerve injury of other unpredictable medical problems. All of the patients questions were answered and they are agreeable to proceed with surgery.   He knows to continue his aspirin and statin right up through surgery.  Deitra Mayo [facility] [phone]

## 2016-04-02 ENCOUNTER — Other Ambulatory Visit (HOSPITAL_COMMUNITY): Payer: Medicare Other

## 2016-04-03 ENCOUNTER — Encounter (HOSPITAL_COMMUNITY)
Admission: RE | Admit: 2016-04-03 | Discharge: 2016-04-03 | Disposition: A | Discharge status: Home / Self Care | Payer: Medicare Other | Source: Ambulatory Visit | Attending: Vascular Surgery | Admitting: Vascular Surgery

## 2016-04-03 ENCOUNTER — Encounter (HOSPITAL_COMMUNITY): Payer: Self-pay

## 2016-04-03 HISTORY — DX: Cerebral infarction, unspecified: I63.9

## 2016-04-03 HISTORY — DX: Polyneuropathy, unspecified: G62.9

## 2016-04-03 HISTORY — DX: Headache, unspecified: R51.9

## 2016-04-03 HISTORY — DX: Headache: R51

## 2016-04-03 LAB — URINE MICROSCOPIC-ADD ON

## 2016-04-03 LAB — URINALYSIS, ROUTINE W REFLEX MICROSCOPIC
BILIRUBIN URINE: NEGATIVE
Glucose, UA: >1000 mg/dL — AB
KETONES UR: NEGATIVE mg/dL
Leukocytes, UA: NEGATIVE
NITRITE: NEGATIVE
Protein, ur: >300 mg/dL — AB
Specific Gravity, Urine: 1.031 — ABNORMAL HIGH (ref 1.005–1.030)
pH: 5 (ref 5.0–8.0)

## 2016-04-03 LAB — COMPREHENSIVE METABOLIC PANEL
ALT: 14 U/L — ABNORMAL LOW (ref 17–63)
AST: 16 U/L (ref 15–41)
Albumin: 3.7 g/dL (ref 3.5–5.0)
Alkaline Phosphatase: 84 U/L (ref 38–126)
Anion gap: 8 (ref 5–15)
BUN: 26 mg/dL — ABNORMAL HIGH (ref 6–20)
CHLORIDE: 103 mmol/L (ref 101–111)
CO2: 25 mmol/L (ref 22–32)
Calcium: 9.2 mg/dL (ref 8.9–10.3)
Creatinine, Ser: 1.17 mg/dL (ref 0.61–1.24)
Glucose, Bld: 143 mg/dL — ABNORMAL HIGH (ref 65–99)
POTASSIUM: 3.8 mmol/L (ref 3.5–5.1)
Sodium: 136 mmol/L (ref 135–145)
Total Bilirubin: 0.7 mg/dL (ref 0.3–1.2)
Total Protein: 7.5 g/dL (ref 6.5–8.1)

## 2016-04-03 LAB — ABO/RH: ABO/RH(D): B NEG

## 2016-04-03 LAB — CBC
HCT: 42.4 % (ref 39.0–52.0)
HEMOGLOBIN: 14.3 g/dL (ref 13.0–17.0)
MCH: 29.5 pg (ref 26.0–34.0)
MCHC: 33.7 g/dL (ref 30.0–36.0)
MCV: 87.4 fL (ref 78.0–100.0)
PLATELETS: 245 10*3/uL (ref 150–400)
RBC: 4.85 MIL/uL (ref 4.22–5.81)
RDW: 12.8 % (ref 11.5–15.5)
WBC: 7.9 10*3/uL (ref 4.0–10.5)

## 2016-04-03 LAB — TYPE AND SCREEN
ABO/RH(D): B NEG
ANTIBODY SCREEN: NEGATIVE

## 2016-04-03 LAB — SURGICAL PCR SCREEN
MRSA, PCR: POSITIVE — AB
STAPHYLOCOCCUS AUREUS: POSITIVE — AB

## 2016-04-03 LAB — GLUCOSE, CAPILLARY: Glucose-Capillary: 161 mg/dL — ABNORMAL HIGH (ref 65–99)

## 2016-04-03 NOTE — Pre-Procedure Instructions (Signed)
SUDEYS PRUNER  04/03/2016      [id number] - [place], [place] [address] Phone: [phone] Fax: [phone]  [id number] - [place], [place] [address] [place] [address] Phone: [phone] Fax: [phone]    Your procedure is scheduled on    Thursday 04/04/16  Report to [facility] Admitting at 730 A.M.  Call this number if you have problems the morning of surgery:  [phone]   Remember:  Do not eat food or drink liquids after midnight.  Take these medicines the morning of surgery with A SIP OF WATER  OMEPRAZOLE (PRILOSEC)    How to Manage Your Diabetes Before and After Surgery  Why is it important to control my blood sugar before and after surgery? . Improving blood sugar levels before and after surgery helps healing and can limit problems. . A way of improving blood sugar control is eating a healthy diet by: o  Eating less sugar and carbohydrates o  Increasing activity/exercise o  Talking with your doctor about reaching your blood sugar goals . High blood sugars (greater than 180 mg/dL) can raise your risk of infections and slow your recovery, so you will need to focus on controlling your diabetes during the weeks before surgery. . Make sure that the doctor who takes care of your diabetes knows about your planned surgery including the date and location.  How do I manage my blood sugar before surgery? . Check your blood sugar at least 4 times a day, starting 2 days before surgery, to make sure that the level is not too high or low. o Check your blood sugar the morning of your surgery when you wake up and every 2 hours until you get to the Short Stay unit. . If your blood sugar is less than 70 mg/dL, you will need to treat for low blood sugar: o Do not take insulin. o Treat a low blood sugar (less than 70 mg/dL) with  cup of clear juice (cranberry or apple), 4  glucose tablets, OR glucose gel. o Recheck blood sugar in 15 minutes after treatment (to make sure it is greater than 70 mg/dL). If your blood sugar is not greater than 70 mg/dL on recheck, call [phone] for further instructions. . Report your blood sugar to the short stay nurse when you get to Short Stay.  . If you are admitted to the hospital after surgery: o Your blood sugar will be checked by the staff and you will probably be given insulin after surgery (instead of oral diabetes medicines) to make sure you have good blood sugar levels. o The goal for blood sugar control after surgery is 80-180 mg/dL.              WHAT DO I DO ABOUT MY DIABETES MEDICATION?   Marland Kitchen Do not take oral diabetes medicines (pills) the morning of surgery.  . THE NIGHT BEFORE SURGERY, take ___50________ units of ____LANTUS_______insulin.       Marland Kitchen HE MORNING OF SURGERY, take ________NONE_____ units of ___NONE_______insulin.  . The day of surgery, do not take other diabetes injectables, including Byetta (exenatide), Bydureon (exenatide ER), Victoza (liraglutide), or Trulicity (dulaglutide).  . If your CBG is greater than 220 mg/dL, you may take  of your sliding scale (correction) dose of insulin.  Other Instructions:          Patient Signature:  Date:  Nurse Signature:  Date:   Reviewed and Endorsed by [facility] Patient Education Committee, August 2015  Do not wear jewelry, make-up or nail polish.  Do not wear lotions, powders, or perfumes, or deoderant.  Do not shave 48 hours prior to surgery.  Men may shave face and neck.  Do not bring valuables to the hospital.  [facility] is not responsible for any belongings or valuables.  Contacts, dentures or bridgework may not be worn into surgery.  Leave your suitcase in the car.  After surgery it may be brought to your room.  For patients admitted to the hospital, discharge time will be determined by your treatment team.  Patients  discharged the day of surgery will not be allowed to drive home.   Name and phone number of your driver:    Special instructions:  SEE PREPARING FOR SURGERY  Please read over the following fact sheets that you were given. Coughing and Deep Breathing, MRSA Information and Surgical Site Infection Prevention

## 2016-04-03 NOTE — Progress Notes (Signed)
   04/03/16 1218  OBSTRUCTIVE SLEEP APNEA  Have you ever been diagnosed with sleep apnea through a sleep study? (UNABLE TO COMPLETE TEST, BUT PATIENT STATES WAS TOLD HE HAD)  Do you snore loudly (loud enough to be heard through closed doors)?  1  Do you often feel tired, fatigued, or sleepy during the daytime (such as falling asleep during driving or talking to someone)? 0  Has anyone observed you stop breathing during your sleep? 1  Do you have, or are you being treated for high blood pressure? 1  BMI more than 35 kg/m2? 1  Neck circumference greater than:Male 16 inches or larger, Male 17inches or larger? 1  Male Gender (Yes=1) 1  Obstructive Sleep Apnea Score 6  Score 5 or greater  Results sent to PCP

## 2016-04-04 ENCOUNTER — Inpatient Hospital Stay (HOSPITAL_COMMUNITY)
Admission: RE | Admit: 2016-04-04 | Discharge: 2016-04-05 | DRG: 038 | Disposition: A | Discharge status: Home / Self Care | Payer: Medicare Other | Source: Ambulatory Visit | Attending: Vascular Surgery | Admitting: Vascular Surgery

## 2016-04-04 ENCOUNTER — Encounter (HOSPITAL_COMMUNITY)
Admission: RE | Disposition: A | Discharge status: Home / Self Care | Payer: Self-pay | Source: Ambulatory Visit | Attending: Vascular Surgery

## 2016-04-04 ENCOUNTER — Inpatient Hospital Stay (HOSPITAL_COMMUNITY): Payer: Medicare Other | Admitting: Certified Registered Nurse Anesthetist

## 2016-04-04 ENCOUNTER — Encounter (HOSPITAL_COMMUNITY): Payer: Self-pay

## 2016-04-04 DIAGNOSIS — E1140 Type 2 diabetes mellitus with diabetic neuropathy, unspecified: Secondary | ICD-10-CM | POA: Diagnosis present

## 2016-04-04 DIAGNOSIS — I129 Hypertensive chronic kidney disease with stage 1 through stage 4 chronic kidney disease, or unspecified chronic kidney disease: Secondary | ICD-10-CM | POA: Diagnosis present

## 2016-04-04 DIAGNOSIS — I6529 Occlusion and stenosis of unspecified carotid artery: Secondary | ICD-10-CM

## 2016-04-04 DIAGNOSIS — E1122 Type 2 diabetes mellitus with diabetic chronic kidney disease: Secondary | ICD-10-CM | POA: Diagnosis present

## 2016-04-04 DIAGNOSIS — Z794 Long term (current) use of insulin: Secondary | ICD-10-CM

## 2016-04-04 DIAGNOSIS — I6521 Occlusion and stenosis of right carotid artery: Secondary | ICD-10-CM | POA: Diagnosis not present

## 2016-04-04 DIAGNOSIS — K219 Gastro-esophageal reflux disease without esophagitis: Secondary | ICD-10-CM | POA: Diagnosis present

## 2016-04-04 DIAGNOSIS — N182 Chronic kidney disease, stage 2 (mild): Secondary | ICD-10-CM | POA: Diagnosis present

## 2016-04-04 DIAGNOSIS — Z6841 Body Mass Index (BMI) 40.0 and over, adult: Secondary | ICD-10-CM | POA: Diagnosis not present

## 2016-04-04 DIAGNOSIS — H5442 Blindness, left eye, normal vision right eye: Secondary | ICD-10-CM | POA: Diagnosis present

## 2016-04-04 DIAGNOSIS — I720 Aneurysm of carotid artery: Secondary | ICD-10-CM | POA: Diagnosis present

## 2016-04-04 DIAGNOSIS — G2581 Restless legs syndrome: Secondary | ICD-10-CM | POA: Diagnosis present

## 2016-04-04 DIAGNOSIS — Z8673 Personal history of transient ischemic attack (TIA), and cerebral infarction without residual deficits: Secondary | ICD-10-CM

## 2016-04-04 DIAGNOSIS — E785 Hyperlipidemia, unspecified: Secondary | ICD-10-CM | POA: Diagnosis present

## 2016-04-04 HISTORY — PX: PATCH ANGIOPLASTY: SHX6230

## 2016-04-04 HISTORY — PX: ENDARTERECTOMY: SHX5162

## 2016-04-04 LAB — GLUCOSE, CAPILLARY
GLUCOSE-CAPILLARY: 166 mg/dL — AB (ref 65–99)
GLUCOSE-CAPILLARY: 168 mg/dL — AB (ref 65–99)
GLUCOSE-CAPILLARY: 167 mg/dL — AB (ref 65–99)
GLUCOSE-CAPILLARY: 260 mg/dL — AB (ref 65–99)

## 2016-04-04 LAB — CREATININE, SERUM
CREATININE: 1.32 mg/dL — AB (ref 0.61–1.24)
GFR calc Af Amer: >60 mL/min (ref >60)

## 2016-04-04 LAB — CBC
HCT: 35.8 % — ABNORMAL LOW (ref 39.0–52.0)
HEMOGLOBIN: 11.9 g/dL — AB (ref 13.0–17.0)
MCH: 29.2 pg (ref 26.0–34.0)
MCHC: 33.2 g/dL (ref 30.0–36.0)
MCV: 87.7 fL (ref 78.0–100.0)
PLATELETS: 198 10*3/uL (ref 150–400)
RBC: 4.08 MIL/uL — AB (ref 4.22–5.81)
RDW: 12.8 % (ref 11.5–15.5)
WBC: 9.2 10*3/uL (ref 4.0–10.5)

## 2016-04-04 LAB — PROTIME-INR
INR: 1.04
Prothrombin Time: 13.6 seconds (ref 11.4–15.2)

## 2016-04-04 LAB — APTT: aPTT: 28 seconds (ref 24–36)

## 2016-04-04 SURGERY — ENDARTERECTOMY, CAROTID
Anesthesia: General | Site: Neck | Laterality: Right

## 2016-04-04 MED ORDER — POTASSIUM CHLORIDE CRYS ER 20 MEQ PO TBCR: 20.0000 meq | EXTENDED_RELEASE_TABLET | Freq: Every day | ORAL | Status: DC | PRN

## 2016-04-04 MED ORDER — CANAGLIFLOZIN 100 MG PO TABS
100.0000 mg | ORAL_TABLET | Freq: Every day | ORAL | Status: DC
  Administered 2016-04-05: 100 mg via ORAL
  Filled 2016-04-04: qty 1

## 2016-04-04 MED ORDER — LABETALOL HCL 5 MG/ML IV SOLN
10.0000 mg | INTRAVENOUS | Status: DC | PRN
  Administered 2016-04-04: 10 mg via INTRAVENOUS

## 2016-04-04 MED ORDER — PROMETHAZINE HCL 25 MG/ML IJ SOLN: 6.2500 mg | INTRAMUSCULAR | Status: DC | PRN

## 2016-04-04 MED ORDER — SODIUM CHLORIDE 0.9 % IV SOLN: INTRAVENOUS | Status: DC

## 2016-04-04 MED ORDER — LIDOCAINE-EPINEPHRINE (PF) 1 %-1:200000 IJ SOLN
INTRAMUSCULAR | Status: AC
  Filled 2016-04-04: qty 30

## 2016-04-04 MED ORDER — PHENYLEPHRINE 40 MCG/ML (10ML) SYRINGE FOR IV PUSH (FOR BLOOD PRESSURE SUPPORT)
PREFILLED_SYRINGE | INTRAVENOUS | Status: AC
  Filled 2016-04-04: qty 10

## 2016-04-04 MED ORDER — LIDOCAINE-EPINEPHRINE (PF) 1 %-1:200000 IJ SOLN
INTRAMUSCULAR | Status: DC | PRN
  Administered 2016-04-04: 10 mL

## 2016-04-04 MED ORDER — PROTAMINE SULFATE 10 MG/ML IV SOLN
INTRAVENOUS | Status: DC | PRN
  Administered 2016-04-04: 10 mg via INTRAVENOUS
  Administered 2016-04-04 (×2): 20 mg via INTRAVENOUS

## 2016-04-04 MED ORDER — ESMOLOL HCL 100 MG/10ML IV SOLN
INTRAVENOUS | Status: DC | PRN
  Administered 2016-04-04 (×2): 50 ug via INTRAVENOUS

## 2016-04-04 MED ORDER — 0.9 % SODIUM CHLORIDE (POUR BTL) OPTIME
TOPICAL | Status: DC | PRN
  Administered 2016-04-04 (×2): 1000 mL

## 2016-04-04 MED ORDER — PHENYLEPHRINE 40 MCG/ML (10ML) SYRINGE FOR IV PUSH (FOR BLOOD PRESSURE SUPPORT)
PREFILLED_SYRINGE | INTRAVENOUS | Status: DC | PRN
  Administered 2016-04-04: 40 ug via INTRAVENOUS
  Administered 2016-04-04 (×2): 80 ug via INTRAVENOUS
  Administered 2016-04-04: 40 ug via INTRAVENOUS
  Administered 2016-04-04 (×2): 80 ug via INTRAVENOUS

## 2016-04-04 MED ORDER — OXYCODONE-ACETAMINOPHEN 5-325 MG PO TABS
1.0000 | ORAL_TABLET | ORAL | Status: DC | PRN
  Administered 2016-04-04: 2 via ORAL
  Filled 2016-04-04: qty 2

## 2016-04-04 MED ORDER — INSULIN ASPART 100 UNIT/ML ~~LOC~~ SOLN
0.0000 [IU] | Freq: Three times a day (TID) | SUBCUTANEOUS | Status: DC
  Administered 2016-04-04 – 2016-04-05 (×2): 2 [IU] via SUBCUTANEOUS

## 2016-04-04 MED ORDER — METOPROLOL TARTRATE 5 MG/5ML IV SOLN: 2.0000 mg | INTRAVENOUS | Status: DC | PRN

## 2016-04-04 MED ORDER — MAGNESIUM SULFATE 2 GM/50ML IV SOLN: 2.0000 g | Freq: Every day | INTRAVENOUS | Status: DC | PRN

## 2016-04-04 MED ORDER — HEPARIN SODIUM (PORCINE) 1000 UNIT/ML IJ SOLN
INTRAMUSCULAR | Status: AC
  Filled 2016-04-04: qty 1

## 2016-04-04 MED ORDER — SODIUM CHLORIDE 0.9 % IV SOLN
INTRAVENOUS | Status: DC
  Administered 2016-04-04: 14:00:00 via INTRAVENOUS

## 2016-04-04 MED ORDER — MIDAZOLAM HCL 5 MG/5ML IJ SOLN
INTRAMUSCULAR | Status: DC | PRN
  Administered 2016-04-04: 1 mg via INTRAVENOUS

## 2016-04-04 MED ORDER — LABETALOL HCL 5 MG/ML IV SOLN
INTRAVENOUS | Status: AC
  Administered 2016-04-04: 10 mg via INTRAVENOUS
  Filled 2016-04-04: qty 4

## 2016-04-04 MED ORDER — LIDOCAINE 2% (20 MG/ML) 5 ML SYRINGE
INTRAMUSCULAR | Status: AC
  Filled 2016-04-04: qty 10

## 2016-04-04 MED ORDER — SENNOSIDES-DOCUSATE SODIUM 8.6-50 MG PO TABS: 1.0000 | ORAL_TABLET | Freq: Every evening | ORAL | Status: DC | PRN

## 2016-04-04 MED ORDER — DOXYCYCLINE HYCLATE 100 MG PO CAPS: 100.0000 mg | ORAL_CAPSULE | Freq: Every day | ORAL | Status: DC

## 2016-04-04 MED ORDER — PANTOPRAZOLE SODIUM 40 MG PO TBEC
40.0000 mg | DELAYED_RELEASE_TABLET | Freq: Every day | ORAL | Status: DC
  Administered 2016-04-04 – 2016-04-05 (×2): 40 mg via ORAL
  Filled 2016-04-04 (×2): qty 1

## 2016-04-04 MED ORDER — LACTATED RINGERS IV SOLN
INTRAVENOUS | Status: DC
  Administered 2016-04-04: 50 mL/h via INTRAVENOUS

## 2016-04-04 MED ORDER — PHENYLEPHRINE HCL 10 MG/ML IJ SOLN
INTRAVENOUS | Status: DC | PRN
  Administered 2016-04-04: 20 ug/min via INTRAVENOUS

## 2016-04-04 MED ORDER — PROTAMINE SULFATE 10 MG/ML IV SOLN
INTRAVENOUS | Status: AC
  Filled 2016-04-04: qty 5

## 2016-04-04 MED ORDER — PROPOFOL 10 MG/ML IV BOLUS
INTRAVENOUS | Status: AC
  Filled 2016-04-04: qty 40

## 2016-04-04 MED ORDER — ASPIRIN EC 81 MG PO TBEC
81.0000 mg | DELAYED_RELEASE_TABLET | Freq: Every day | ORAL | Status: DC
  Administered 2016-04-04 – 2016-04-05 (×2): 81 mg via ORAL
  Filled 2016-04-04 (×2): qty 1

## 2016-04-04 MED ORDER — FENOFIBRATE 54 MG PO TABS
54.0000 mg | ORAL_TABLET | Freq: Every day | ORAL | Status: DC
  Administered 2016-04-04 – 2016-04-05 (×2): 54 mg via ORAL
  Filled 2016-04-04 (×2): qty 1

## 2016-04-04 MED ORDER — SUGAMMADEX SODIUM 200 MG/2ML IV SOLN
INTRAVENOUS | Status: AC
  Filled 2016-04-04: qty 2

## 2016-04-04 MED ORDER — FENTANYL CITRATE (PF) 100 MCG/2ML IJ SOLN
INTRAMUSCULAR | Status: AC
  Filled 2016-04-04: qty 2

## 2016-04-04 MED ORDER — PHENOL 1.4 % MT LIQD
1.0000 | OROMUCOSAL | Status: DC | PRN
  Administered 2016-04-04: 1 via OROMUCOSAL
  Filled 2016-04-04: qty 177

## 2016-04-04 MED ORDER — MIDAZOLAM HCL 2 MG/2ML IJ SOLN
INTRAMUSCULAR | Status: AC
  Filled 2016-04-04: qty 2

## 2016-04-04 MED ORDER — ONDANSETRON HCL 4 MG/2ML IJ SOLN
INTRAMUSCULAR | Status: DC | PRN
  Administered 2016-04-04: 4 mg via INTRAVENOUS

## 2016-04-04 MED ORDER — CHLORHEXIDINE GLUCONATE CLOTH 2 % EX PADS
6.0000 | MEDICATED_PAD | Freq: Every day | CUTANEOUS | Status: DC
  Administered 2016-04-05: 6 via TOPICAL

## 2016-04-04 MED ORDER — SODIUM CHLORIDE 0.9 % IV SOLN: 500.0000 mL | Freq: Once | INTRAVENOUS | Status: DC | PRN

## 2016-04-04 MED ORDER — LIDOCAINE HCL 4 % MT SOLN
OROMUCOSAL | Status: DC | PRN
  Administered 2016-04-04: 3 mL via TOPICAL

## 2016-04-04 MED ORDER — HEPARIN SODIUM (PORCINE) 1000 UNIT/ML IJ SOLN
INTRAMUSCULAR | Status: DC | PRN
  Administered 2016-04-04: 11000 [IU] via INTRAVENOUS

## 2016-04-04 MED ORDER — LACTATED RINGERS IV SOLN
INTRAVENOUS | Status: DC | PRN
  Administered 2016-04-04: 09:00:00 via INTRAVENOUS

## 2016-04-04 MED ORDER — PRAVASTATIN SODIUM 40 MG PO TABS
80.0000 mg | ORAL_TABLET | Freq: Every evening | ORAL | Status: DC
  Administered 2016-04-04: 80 mg via ORAL
  Filled 2016-04-04: qty 2

## 2016-04-04 MED ORDER — ONDANSETRON HCL 4 MG/2ML IJ SOLN
INTRAMUSCULAR | Status: AC
  Filled 2016-04-04: qty 2

## 2016-04-04 MED ORDER — DEXTROSE 5 % IV SOLN
1.5000 g | Freq: Two times a day (BID) | INTRAVENOUS | Status: DC
  Filled 2016-04-04: qty 1.5

## 2016-04-04 MED ORDER — GLIPIZIDE ER 10 MG PO TB24
10.0000 mg | ORAL_TABLET | Freq: Two times a day (BID) | ORAL | Status: DC
  Administered 2016-04-04 – 2016-04-05 (×2): 10 mg via ORAL
  Filled 2016-04-04 (×3): qty 1

## 2016-04-04 MED ORDER — LABETALOL HCL 5 MG/ML IV SOLN
INTRAVENOUS | Status: DC | PRN
  Administered 2016-04-04: 5 mg via INTRAVENOUS
  Administered 2016-04-04: 2.5 mg via INTRAVENOUS
  Administered 2016-04-04: 5 mg via INTRAVENOUS

## 2016-04-04 MED ORDER — DEXTRAN 40 IN SALINE 10-0.9 % IV SOLN
INTRAVENOUS | Status: DC | PRN
  Administered 2016-04-04: 500 mL

## 2016-04-04 MED ORDER — CHLORHEXIDINE GLUCONATE CLOTH 2 % EX PADS
6.0000 | MEDICATED_PAD | Freq: Once | CUTANEOUS | Status: DC
Start: 2016-04-04 — End: 2016-04-04

## 2016-04-04 MED ORDER — LIDOCAINE HCL (PF) 1 % IJ SOLN
INTRAMUSCULAR | Status: AC
  Filled 2016-04-04: qty 30

## 2016-04-04 MED ORDER — INSULIN GLARGINE 100 UNIT/ML ~~LOC~~ SOLN
100.0000 [IU] | Freq: Every day | SUBCUTANEOUS | Status: DC
  Administered 2016-04-04: 100 [IU] via SUBCUTANEOUS
  Filled 2016-04-04 (×2): qty 1

## 2016-04-04 MED ORDER — ENOXAPARIN SODIUM 40 MG/0.4ML ~~LOC~~ SOLN
40.0000 mg | SUBCUTANEOUS | Status: DC
  Administered 2016-04-05: 40 mg via SUBCUTANEOUS
  Filled 2016-04-04: qty 0.4

## 2016-04-04 MED ORDER — DEXTROSE 5 % IV SOLN
1.5000 g | Freq: Three times a day (TID) | INTRAVENOUS | Status: AC
  Administered 2016-04-04 (×2): 1.5 g via INTRAVENOUS
  Filled 2016-04-04 (×2): qty 1.5

## 2016-04-04 MED ORDER — MORPHINE SULFATE (PF) 2 MG/ML IV SOLN: 2.0000 mg | INTRAVENOUS | Status: DC | PRN

## 2016-04-04 MED ORDER — SODIUM CHLORIDE 0.9 % IV SOLN
0.0125 ug/kg/min | INTRAVENOUS | Status: AC
  Administered 2016-04-04: .1 ug/kg/min via INTRAVENOUS
  Filled 2016-04-04: qty 2000

## 2016-04-04 MED ORDER — BISACODYL 5 MG PO TBEC: 5.0000 mg | DELAYED_RELEASE_TABLET | Freq: Every day | ORAL | Status: DC | PRN

## 2016-04-04 MED ORDER — ONDANSETRON HCL 4 MG/2ML IJ SOLN: 4.0000 mg | Freq: Four times a day (QID) | INTRAMUSCULAR | Status: DC | PRN

## 2016-04-04 MED ORDER — ACETAMINOPHEN 500 MG PO TABS
1000.0000 mg | ORAL_TABLET | Freq: Four times a day (QID) | ORAL | Status: DC | PRN
  Administered 2016-04-04: 1000 mg via ORAL
  Filled 2016-04-04: qty 2

## 2016-04-04 MED ORDER — ALUM & MAG HYDROXIDE-SIMETH 200-200-20 MG/5ML PO SUSP: 15.0000 mL | ORAL | Status: DC | PRN

## 2016-04-04 MED ORDER — SUGAMMADEX SODIUM 200 MG/2ML IV SOLN
INTRAVENOUS | Status: DC | PRN
  Administered 2016-04-04: 100 mg via INTRAVENOUS

## 2016-04-04 MED ORDER — ROCURONIUM BROMIDE 10 MG/ML (PF) SYRINGE
PREFILLED_SYRINGE | INTRAVENOUS | Status: AC
  Filled 2016-04-04: qty 10

## 2016-04-04 MED ORDER — GUAIFENESIN-DM 100-10 MG/5ML PO SYRP: 15.0000 mL | ORAL_SOLUTION | ORAL | Status: DC | PRN

## 2016-04-04 MED ORDER — DOCUSATE SODIUM 100 MG PO CAPS
100.0000 mg | ORAL_CAPSULE | Freq: Every day | ORAL | Status: DC
  Filled 2016-04-04: qty 1

## 2016-04-04 MED ORDER — MUPIROCIN 2 % EX OINT
1.0000 | TOPICAL_OINTMENT | Freq: Two times a day (BID) | CUTANEOUS | Status: DC
Start: 2016-04-04 — End: 2016-04-05
  Administered 2016-04-04 – 2016-04-05 (×3): 1 via NASAL
  Filled 2016-04-04 (×2): qty 22

## 2016-04-04 MED ORDER — HYDROMORPHONE HCL 1 MG/ML IJ SOLN: 0.2500 mg | INTRAMUSCULAR | Status: DC | PRN

## 2016-04-04 MED ORDER — ROCURONIUM BROMIDE 10 MG/ML (PF) SYRINGE
PREFILLED_SYRINGE | INTRAVENOUS | Status: DC | PRN
  Administered 2016-04-04: 70 mg via INTRAVENOUS

## 2016-04-04 MED ORDER — LOSARTAN POTASSIUM 50 MG PO TABS
100.0000 mg | ORAL_TABLET | Freq: Every evening | ORAL | Status: DC
  Administered 2016-04-04: 100 mg via ORAL
  Filled 2016-04-04: qty 2

## 2016-04-04 MED ORDER — PROPOFOL 10 MG/ML IV BOLUS
INTRAVENOUS | Status: DC | PRN
  Administered 2016-04-04 (×3): 20 mg via INTRAVENOUS
  Administered 2016-04-04: 180 mg via INTRAVENOUS

## 2016-04-04 MED ORDER — HYDRALAZINE HCL 20 MG/ML IJ SOLN: 5.0000 mg | INTRAMUSCULAR | Status: DC | PRN

## 2016-04-04 MED ORDER — NITROGLYCERIN 0.2 MG/ML ON CALL CATH LAB
INTRAVENOUS | Status: DC | PRN
  Administered 2016-04-04 (×2): 20 ug via INTRAVENOUS

## 2016-04-04 MED ORDER — THROMBIN 20000 UNITS EX SOLR
CUTANEOUS | Status: AC
  Filled 2016-04-04: qty 20000

## 2016-04-04 MED ORDER — DEXTRAN 40 IN SALINE 10-0.9 % IV SOLN
INTRAVENOUS | Status: AC
  Filled 2016-04-04: qty 500

## 2016-04-04 MED ORDER — HEPARIN SODIUM (PORCINE) 5000 UNIT/ML IJ SOLN
INTRAMUSCULAR | Status: DC | PRN
  Administered 2016-04-04: 11:00:00

## 2016-04-04 MED ORDER — LIDOCAINE 2% (20 MG/ML) 5 ML SYRINGE
INTRAMUSCULAR | Status: DC | PRN
  Administered 2016-04-04: 80 mg via INTRAVENOUS

## 2016-04-04 MED ORDER — CHLORHEXIDINE GLUCONATE CLOTH 2 % EX PADS: 6.0000 | MEDICATED_PAD | Freq: Once | CUTANEOUS | Status: DC

## 2016-04-04 MED ORDER — DEXTROSE 5 % IV SOLN
1.5000 g | INTRAVENOUS | Status: AC
  Administered 2016-04-04: 1.5 g via INTRAVENOUS
  Filled 2016-04-04: qty 1.5

## 2016-04-04 SURGICAL SUPPLY — 46 items
BAG DECANTER FOR FLEXI CONT (MISCELLANEOUS) ×3 IMPLANT
CANISTER SUCTION 2500CC (MISCELLANEOUS) ×3 IMPLANT
CANNULA VESSEL 3MM 2 BLNT TIP (CANNULA) ×6 IMPLANT
CATH ROBINSON RED A/P 18FR (CATHETERS) ×3 IMPLANT
CLIP TI MEDIUM 24 (CLIP) ×3 IMPLANT
CLIP TI WIDE RED SMALL 24 (CLIP) ×3 IMPLANT
CRADLE DONUT ADULT HEAD (MISCELLANEOUS) ×3 IMPLANT
DRAIN CHANNEL 15F RND FF W/TCR (WOUND CARE) IMPLANT
ELECT REM PT RETURN 9FT ADLT (ELECTROSURGICAL) ×3
ELECTRODE REM PT RTRN 9FT ADLT (ELECTROSURGICAL) ×1 IMPLANT
EVACUATOR SILICONE 100CC (DRAIN) IMPLANT
GLOVE BIO SURGEON STRL SZ7.5 (GLOVE) ×3 IMPLANT
GLOVE BIOGEL PI IND STRL 6.5 (GLOVE) ×1 IMPLANT
GLOVE BIOGEL PI IND STRL 7.5 (GLOVE) ×1 IMPLANT
GLOVE BIOGEL PI IND STRL 8 (GLOVE) ×2 IMPLANT
GLOVE BIOGEL PI INDICATOR 6.5 (GLOVE) ×2
GLOVE BIOGEL PI INDICATOR 7.5 (GLOVE) ×2
GLOVE BIOGEL PI INDICATOR 8 (GLOVE) ×4
GLOVE ECLIPSE 6.5 STRL STRAW (GLOVE) ×3 IMPLANT
GLOVE SURG SS PI 6.5 STRL IVOR (GLOVE) ×6 IMPLANT
GOWN STRL REUS W/ TWL LRG LVL3 (GOWN DISPOSABLE) ×3 IMPLANT
GOWN STRL REUS W/ TWL XL LVL3 (GOWN DISPOSABLE) ×1 IMPLANT
GOWN STRL REUS W/TWL LRG LVL3 (GOWN DISPOSABLE) ×6
GOWN STRL REUS W/TWL XL LVL3 (GOWN DISPOSABLE) ×2
KIT BASIN OR (CUSTOM PROCEDURE TRAY) ×3 IMPLANT
KIT ROOM TURNOVER OR (KITS) ×3 IMPLANT
LIQUID BAND (GAUZE/BANDAGES/DRESSINGS) ×3 IMPLANT
NEEDLE HYPO 25X1 1.5 SAFETY (NEEDLE) ×3 IMPLANT
NS IRRIG 1000ML POUR BTL (IV SOLUTION) ×6 IMPLANT
PACK CAROTID (CUSTOM PROCEDURE TRAY) ×3 IMPLANT
PAD ARMBOARD 7.5X6 YLW CONV (MISCELLANEOUS) ×6 IMPLANT
PATCH VASC XENOSURE 1CMX6CM (Vascular Products) ×2 IMPLANT
PATCH VASC XENOSURE 1X6 (Vascular Products) ×1 IMPLANT
SHUNT CAROTID BYPASS 10 (VASCULAR PRODUCTS) ×3 IMPLANT
SHUNT CAROTID BYPASS 12FRX15.5 (VASCULAR PRODUCTS) IMPLANT
SPONGE INTESTINAL PEANUT (DISPOSABLE) IMPLANT
SPONGE SURGIFOAM ABS GEL 100 (HEMOSTASIS) IMPLANT
SUT PROLENE 6 0 BV (SUTURE) ×6 IMPLANT
SUT PROLENE 7 0 BV 1 (SUTURE) IMPLANT
SUT SILK 2 0 FS (SUTURE) IMPLANT
SUT VIC AB 3-0 SH 27 (SUTURE) ×2
SUT VIC AB 3-0 SH 27X BRD (SUTURE) ×1 IMPLANT
SUT VICRYL 4-0 PS2 18IN ABS (SUTURE) ×3 IMPLANT
SYR CONTROL 10ML LL (SYRINGE) ×3 IMPLANT
SYRINGE 20CC LL (MISCELLANEOUS) ×3 IMPLANT
WATER STERILE IRR 1000ML POUR (IV SOLUTION) ×3 IMPLANT

## 2016-04-04 NOTE — H&P (View-Only) (Signed)
Patient name: Oscar Rose MRN: [id number] DOB: 1960-12-28 Sex: male  REASON FOR CONSULT: Right carotid stenosis  HPI: Oscar Rose is a 55 y.o. male, who was referred as a new patient with a right carotid stenosis. The patient was admitted in July after he fell at home. Prior to falling he had been describing some intermittent episodes of left upper extremity weakness which would last several minutes and resolved. This had been going on for a week. Since his hospitalization he denies any new symptoms of weakness or paresthesias in his upper extremities or lower extremities. He denies any history of expressive or receptive aphasia or amaurosis fugax. He was not on aspirin prior to the event. He was on a statin. He has been started on aspirin.  I have reviewed the discharge summary from July of this year. The patient was admitted on 02/13/2016 with a stroke. The patient was discharged to a skilled nursing facility on 02/16/2016. The patient had fallen at home and that's why they were admitted to the hospital. Workup included the duplex scan which is described below and shows a significant right carotid stenosis. CT of the head showed no intracranial hemorrhage. Was a suspicion for a right frontal infarct. MRI showed several areas of acute and early subacute infarcts within the right middle cerebral artery distribution.  Past Medical History:  Diagnosis Date  . Arthritis   . At risk for sleep apnea    STOP-BANG= 7    SENT TO PCP 06-29-2014  . CKD (chronic kidney disease), stage II    montitored by nephrologist  . Depression       . Diabetic neuropathy ([place])   . GERD (gastroesophageal reflux disease)   . History of kidney stones   . History of retinal detachment   . Hyperlipidemia   . Hypertension   . 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 Restless leg syndrome   . Retained ureteral stent    w/ encrustation SINCE 2012  . Rotator cuff  syndrome of right shoulder   . Type 2 diabetes mellitus (HCC)     Family History  Problem Relation Age of Onset  . Cancer Mother     Throat    SOCIAL HISTORY: Social History   Social History  . Marital status: Single    Spouse name: N/A  . Number of children: 0  . Years of education: N/A   Occupational History  . Not on file.   Social History Main Topics  . Smoking status: Never Smoker  . Smokeless tobacco: Never Used  . Alcohol use Yes     Comment: Occassionally  . Drug use: No  . Sexual activity: Not on file   Other Topics Concern  . Not on file   Social History Narrative   Lives with "wife"    No Known Allergies  Current Outpatient Prescriptions  Medication Sig Dispense Refill  . acetaminophen (TYLENOL) 500 MG tablet Take 1,000 mg by mouth every 6 (six) hours as needed for mild pain or moderate pain.    Marland Kitchen atorvastatin (LIPITOR) 40 MG tablet Take 1 tablet (40 mg total) by mouth daily at 6 PM.    . Choline Fenofibrate 135 MG capsule Take 135 mg by mouth daily.    Marland Kitchen glipiZIDE (GLUCOTROL XL) 10 MG 24 hr tablet Take 20 mg by mouth at bedtime.     . insulin glargine (LANTUS) 100 UNIT/ML injection  Inject 100 Units into the skin at bedtime.     Marland Kitchen losartan (COZAAR) 25 MG tablet Take 1 tablet (25 mg total) by mouth daily.    Marland Kitchen levETIRAcetam (KEPPRA) 250 MG tablet Take 1 tablet (250 mg total) by mouth 2 (two) times daily. (Patient not taking: Reported on 03/27/2016)     No current facility-administered medications for this visit.     REVIEW OF SYSTEMS:  [X]  denotes positive finding, [ ]  denotes negative finding Cardiac  Comments:  Chest pain or chest pressure: X The symptoms described sound like indigestion in that they're relieved with antiacids.  Shortness of breath upon exertion: X   Short of breath when lying flat:    Irregular heart rhythm:        Vascular    Pain in calf, thigh, or hip brought on by ambulation:    Pain in feet at night that wakes you up from  your sleep:     Blood clot in your veins:    Leg swelling:  X       Pulmonary    Oxygen at home:    Productive cough:     Wheezing:         Neurologic    Sudden weakness in arms or legs:     Sudden numbness in arms or legs:     Sudden onset of difficulty speaking or slurred speech:    Temporary loss of vision in one eye:     Problems with dizziness:         Gastrointestinal    Blood in stool:     Vomited blood:         Genitourinary    Burning when urinating:     Blood in urine:        Psychiatric    Major depression:         Hematologic    Bleeding problems:    Problems with blood clotting too easily:        Skin    Rashes or ulcers:        Constitutional    Fever or chills:      PHYSICAL EXAM: Vitals:   03/27/16 1020 03/27/16 1024  BP: 140/85 132/85  Pulse: 91   Resp: 20   Temp: 97.8 F (36.6 C)   TempSrc: Oral   SpO2: 99%   Weight: 295 lb (133.8 kg)   Height: 5\' 10"  (1.778 m)     GENERAL: The patient is a well-nourished male, in no acute distress. The vital signs are documented above. CARDIAC: There is a regular rate and rhythm.  VASCULAR: I do not detect carotid bruits. The right side, he has a palpable dorsalis pedis and posterior tibial pulse. On the left side he has a palpable dorsalis pedis pulse. I cannot palpate a posterior tibial pulse. Has mild bilateral lower extremity swelling. PULMONARY: There is good air exchange bilaterally without wheezing or rales. ABDOMEN: Soft and non-tender with normal pitched bowel sounds.  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 NEUROLOGIC: No focal weakness or paresthesias are detected. SKIN: There are no ulcers or rashes noted. PSYCHIATRIC: The patient has a normal affect.  DATA:   CAROTID DUPLEX: I have reviewed the carotid duplex scan that was done on 02/14/2016. This was interpreted as showing a greater than 70% right carotid  stenosis with a less than 50% left carotid stenosis. On the right, the end-diastolic velocity  was 96 cm/s suggesting that this is less than 80%.  LIMITED CAROTID DUPLEX: The patient had a right carotid duplex in our office on 03/25/2016. This showed evidence of a 60-79% right carotid stenosis. This was based on the end-diastolic velocity. By the peak systolic velocity the stenosis was greater than 80%. The stenosis was at the mid [place] level and there was a normal segment above the stenosis identified.  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 I have recommended right carotid endarterectomy. I have reviewed the indications for carotid endarterectomy, that is to lower the risk of future stroke. I have also reviewed the potential complications of surgery, including but not limited to: bleeding, stroke (perioperative risk 1-2%), MI, nerve injury of other unpredictable medical problems. All of the patients questions were answered and they are agreeable to proceed with surgery.   He knows to continue his aspirin and statin right up through surgery.  Deitra Mayo [facility] [phone]

## 2016-04-04 NOTE — Anesthesia Preprocedure Evaluation (Signed)
Anesthesia Evaluation  Patient identified by MRN, date of birth, ID band Patient awake    Reviewed: Allergy & Precautions, NPO status , Patient's Chart, lab work & pertinent test results  History of Anesthesia Complications Negative for: history of anesthetic complications  Airway Mallampati: III  TM Distance: >3 FB Neck ROM: Full    Dental  (+) Poor Dentition, Dental Advisory Given   Pulmonary neg pulmonary ROS,    Pulmonary exam normal        Cardiovascular hypertension, Normal cardiovascular exam  Study Conclusions  - Left ventricle: The cavity size was normal. Wall thickness was   increased increased in a pattern of mild to moderate LVH.   Systolic function was normal. The estimated ejection fraction was   in the range of 55% to 60%. Wall motion was normal; there were no   regional wall motion abnormalities. Features are consistent with   a pseudonormal left ventricular filling pattern, with concomitant   abnormal relaxation and increased filling pressure (grade 2   diastolic dysfunction). - Aortic valve: Mildly calcified annulus. Trileaflet. - Aortic root: The aortic root was mildly ectatic. - Mitral valve: There was trivial regurgitation.   Neuro/Psych  Headaches, PSYCHIATRIC DISORDERS Depression CVA    GI/Hepatic Neg liver ROS, GERD  Medicated and Controlled,  Endo/Other  diabetesMorbid obesity  Renal/GU negative Renal ROS     Musculoskeletal   Abdominal   Peds  Hematology   Anesthesia Other Findings   Reproductive/Obstetrics                             Anesthesia Physical Anesthesia Plan  ASA: III  Anesthesia Plan: General   Post-op Pain Management:    Induction: Intravenous  Airway Management Planned: Oral ETT  Additional Equipment: Arterial line  Intra-op Plan:   Post-operative Plan: Extubation in OR  Informed Consent: I have reviewed the patients History and  Physical, chart, labs and discussed the procedure including the risks, benefits and alternatives for the proposed anesthesia with the patient or authorized representative who has indicated his/her understanding and acceptance.   Dental advisory given  Plan Discussed with: CRNA, Anesthesiologist and Surgeon  Anesthesia Plan Comments:         Anesthesia Quick Evaluation

## 2016-04-04 NOTE — Op Note (Signed)
    NAME: Oscar Rose    MRN: [id number] DOB: 1961/04/21    DATE OF OPERATION: 04/04/2016  PREOP DIAGNOSIS: Symptomatic right carotid stenosis  POSTOP DIAGNOSIS: Same  PROCEDURE: Right carotid endarterectomy with bovine pericardial patch angioplasty  SURGEON: Judeth Cornfield. Scot Dock, MD, FACS  ASSIST: Gerri Lins PA  ANESTHESIA: Gen.   EBL: minimal  INDICATIONS: Oscar Rose is a 55 y.o. male who had a right brain stroke. He had an 80% right carotid stenosis and right carotid endarterectomy was recommended in order to lower his risk of future stroke.  FINDINGS: hemorrhagic plaque right carotid, 90%  TECHNIQUE: The patient was taken to the operating room after an arterial line was placed by anesthesia. He received general anesthetic. After careful positioning, the right neck was prepped and draped in the usual sterile fashion. An incision was made along the anterior border of the sternocleidomastoid and dissection carried down to the common carotid artery which was dissected free and controlled with Rummel tourniquet. The facial vein was divided between 2-0 silk ties. The patient was heparinized. The internal carotid artery was controlled above the plaque. The external carotid artery and superior thyroid arteries were controlled. Clamps were then placed on the internal and the common and then the external carotid artery. A longitudinal arteriotomy was made in the common carotid artery. This was extended through the plaque into the normal internal carotid artery. A 10 shunt was placed into the internal carotid artery, backbled and then placed into the common carotid artery and secured with Rummel tourniquet. Flow was reestablished to the shunt. An endarterectomy plane was established proximally. The plaque here was sharply divided. Eversion endarterectomy was performed of the external carotid artery. Distally, there was a nice taper and the plaque in the internal carotid artery and no  tacking sutures were required. The artery was irrigated with copious amounts of heparin and dextran all loose debris removed. A bovine pericardial patch was sewn using continuous 6-0 Prolene suture. Prior to completing the patch closure, the shunt was removed. The arteries were backbled and flushed appropriately and the anastomosis completed. Flow was reestablished first to the external carotid artery and then to the internal carotid artery. There was an excellent Doppler signal in the distal internal carotid artery with good diastolic flow. The heparin was partially reversed with protamine. Hemostasis was obtained in the wound. The wound was closed with a deep layer of 3-0 Vicryl. The platysma was closed with running 3-0 Vicryl. The skin was closed with 4-0 Vicryl. Sterile vessels was applied. The patient awoke neurologically intact. All needle sponge counts were correct. He was transferred to the recovery room in stable condition.  Deitra Mayo, MD, FACS [facility]  DATE OF DICTATION:   04/04/2016

## 2016-04-04 NOTE — Interval H&P Note (Signed)
History and Physical Interval Note:  04/04/2016 9:20 AM  Oscar Rose  has presented today for surgery, with the diagnosis of Right carotid stenosis  The various methods of treatment have been discussed with the patient and family. After consideration of risks, benefits and other options for treatment, the patient has consented to  Procedure(s): ENDARTERECTOMY CAROTID (Right) as a surgical intervention .  The patient's history has been reviewed, patient examined, no change in status, stable for surgery.  I have reviewed the patient's chart and labs.  Questions were answered to the patient's satisfaction.     Deitra Mayo

## 2016-04-04 NOTE — Progress Notes (Signed)
MEDICATION RELATED CONSULT NOTE - INITIAL   Pharmacy Consult for Antibiotic Regnal dose Adjustment  Indication: Post-Op  No Known Allergies  Patient Measurements: Weight: 293 lb (132.9 kg)  Vital Signs: Temp: 98.4 F (36.9 C) (08/31 1330) Temp Source: Oral (08/31 0745) BP: 146/71 (08/31 0745) Pulse Rate: 78 (08/31 0745)  Recent Labs  04/03/16 1253 04/04/16 0730  WBC 7.9  --   HGB 14.3  --   HCT 42.4  --   PLT 245  --   APTT  --  28  CREATININE 1.17  --   ALBUMIN 3.7  --   PROT 7.5  --   AST 16  --   ALT 14*  --   ALKPHOS 84  --   BILITOT 0.7  --    Estimated Creatinine Clearance: 100.9 mL/min (by C-G formula based on SCr of 1.17 mg/dL).   Assessment: 55 yo male receiving cefuroxime for  post-op abx coverage. Pharmacy consulted for renal dose adjustment.     Plan:  Abx have been reviewed for appropriate dose and frequency based on patients current renal function.   Nancy Fetter, PharmD Clinical Pharmacist 04/04/2016 2:22 PM

## 2016-04-04 NOTE — Progress Notes (Signed)
   VASCULAR SURGERY POSTOP NOTE:  Stable postop.  Anticipate discharge in a.m.  SUBJECTIVE: Comfortable  PHYSICAL EXAM: Vitals:   04/04/16 0745 04/04/16 1156 04/04/16 1330  BP: (!) 146/71    Pulse: 78    Resp: 20    Temp: 98 F (36.7 C) 98.2 F (36.8 C) 98.4 F (36.9 C)  TempSrc: Oral    SpO2: 97% 99%   Weight: 293 lb (132.9 kg)     NEURO: Intact. Incision looks fine.  CBG (last 3)   Recent Labs  04/03/16 1144 04/04/16 0756 04/04/16 1206  GLUCAP 161* 166* 168*    Active Problems:   Carotid aneurysm, right ([place])  Gae Gallop Beeper: [id number] 04/04/2016

## 2016-04-04 NOTE — Anesthesia Procedure Notes (Signed)
Procedure Name: Intubation Date/Time: 04/04/2016 9:50 AM Performed by: Merdis Delay Pre-anesthesia Checklist: Patient identified, Emergency Drugs available, Suction available, Patient being monitored and Timeout performed Patient Re-evaluated:Patient Re-evaluated prior to inductionOxygen Delivery Method: Circle system utilized Preoxygenation: Pre-oxygenation with 100% oxygen Intubation Type: IV induction Ventilation: Mask ventilation without difficulty, Oral airway inserted - appropriate to patient size and Two handed mask ventilation required Laryngoscope Size: Mac and 4 Grade View: Grade I Tube type: Oral Tube size: 7.5 mm Number of attempts: 1 Airway Equipment and Method: Stylet Placement Confirmation: ETT inserted through vocal cords under direct vision,  positive ETCO2,  CO2 detector and breath sounds checked- equal and bilateral Secured at: 22 cm Tube secured with: Tape Dental Injury: Teeth and Oropharynx as per pre-operative assessment

## 2016-04-04 NOTE — Anesthesia Postprocedure Evaluation (Signed)
Anesthesia Post Note  [name]  Procedure(s) Performed: Procedure(s) (LRB): ENDARTERECTOMY CAROTID ARTERY RIGHT (Right) PATCH ANGIOPLASTY RIGHT CAROTID ARTERY (Right)  Patient location during evaluation: PACU Anesthesia Type: General Level of consciousness: sedated Pain management: pain level controlled Vital Signs Assessment: post-procedure vital signs reviewed and stable Respiratory status: spontaneous breathing and respiratory function stable Cardiovascular status: stable Anesthetic complications: no    Last Vitals:  Vitals:   04/04/16 0745 04/04/16 1156  BP: (!) 146/71   Pulse: 78   Resp: 20   Temp: 36.7 C 36.8 C    Last Pain:  Vitals:   04/04/16 1230  TempSrc:   PainSc: 2                  Arye Weyenberg DANIEL

## 2016-04-04 NOTE — Transfer of Care (Addendum)
Immediate Anesthesia Transfer of Care Note  [name]  Procedure(s) Performed: Procedure(s): ENDARTERECTOMY CAROTID ARTERY RIGHT (Right) PATCH ANGIOPLASTY RIGHT CAROTID ARTERY (Right)  Patient Location: PACU  Anesthesia Type:General  Level of Consciousness: awake, alert  and oriented  Airway & Oxygen Therapy: Patient Spontanous Breathing and Patient connected to nasal cannula oxygen  Post-op Assessment: Report given to RN and Post -op Vital signs reviewed and stable  Post vital signs: Reviewed and stable  Last Vitals:  Vitals:   04/04/16 0745 04/04/16 1156  BP: (!) 146/71   Pulse: 78   Resp: 20   Temp: 36.7 C 36.8 C    Last Pain:  Vitals:   04/04/16 0745  TempSrc: Oral         Complications: No apparent anesthesia complications    BP 0000000 systolic via arterial line and [id number] systolic via bp cuff in PACU. Dickson ordered bp [id number] systolic. Will continue to monitor.

## 2016-04-05 ENCOUNTER — Encounter (HOSPITAL_COMMUNITY): Payer: Self-pay | Admitting: Vascular Surgery

## 2016-04-05 LAB — BASIC METABOLIC PANEL
Anion gap: 5 (ref 5–15)
BUN: 17 mg/dL (ref 6–20)
CO2: 25 mmol/L (ref 22–32)
CREATININE: 1.19 mg/dL (ref 0.61–1.24)
Calcium: 8.5 mg/dL — ABNORMAL LOW (ref 8.9–10.3)
Chloride: 109 mmol/L (ref 101–111)
GFR calc Af Amer: >60 mL/min (ref >60)
Glucose, Bld: 120 mg/dL — ABNORMAL HIGH (ref 65–99)
POTASSIUM: 3.8 mmol/L (ref 3.5–5.1)
SODIUM: 139 mmol/L (ref 135–145)

## 2016-04-05 LAB — CBC
HCT: 41.3 % (ref 39.0–52.0)
Hemoglobin: 13.7 g/dL (ref 13.0–17.0)
MCH: 29.1 pg (ref 26.0–34.0)
MCHC: 33.2 g/dL (ref 30.0–36.0)
MCV: 87.7 fL (ref 78.0–100.0)
PLATELETS: 157 10*3/uL (ref 150–400)
RBC: 4.71 MIL/uL (ref 4.22–5.81)
RDW: 12.6 % (ref 11.5–15.5)
WBC: 5.4 10*3/uL (ref 4.0–10.5)

## 2016-04-05 LAB — GLUCOSE, CAPILLARY: Glucose-Capillary: 153 mg/dL — ABNORMAL HIGH (ref 65–99)

## 2016-04-05 LAB — HEMOGLOBIN A1C
HEMOGLOBIN A1C: 8.3 % — AB (ref 4.8–5.6)
MEAN PLASMA GLUCOSE: 192 mg/dL

## 2016-04-05 MED ORDER — OXYCODONE-ACETAMINOPHEN 5-325 MG PO TABS
1.0000 | ORAL_TABLET | Freq: Four times a day (QID) | ORAL | 0 refills | Status: DC | PRN
Start: 2016-04-05 — End: 2017-04-30

## 2016-04-05 NOTE — Care Management Note (Addendum)
Case Management Note  Patient Details  Name: Oscar Rose MRN: [id number] Date of Birth: 05/14/1961  Subjective/Objective:   Patient is s/p  Right carotid endarterectomy  For dc today, he has a pcpc , has medication coverage and transport at dc,no needs.              Action/Plan:   Expected Discharge Date:  04/05/16               Expected Discharge Plan:  Home/Self Care  In-House Referral:     Discharge planning Services  CM Consult  Post Acute Care Choice:    Choice offered to:     DME Arranged:    DME Agency:     HH Arranged:    HH Agency:     Status of Service:  Completed, signed off  If discussed at [employer] of Stay Meetings, dates discussed:    Additional Comments:  Tauheed, Gmerek, RN 04/05/2016, 9:35 AM

## 2016-04-05 NOTE — Progress Notes (Addendum)
[facility] [place]  Subjective  - Doing well voided, ambulated and tolerating PO's well.   Objective (!) 174/79 87 97.7 F (36.5 C) (Oral) 17 (!) 89%  Intake/Output Summary (Last 24 hours) at 04/05/16 0717 Last data filed at 04/05/16 0227  Gross per 24 hour  Intake             2515 ml  Output             1575 ml  Net              940 ml    Right neck incision soft without hematoma Smile symmetric, no tongue deviation Grip 5/5 equal B Heart RRR Lungs non labored breathing  Assessment/Planning: POD #Right CEA  Plan for discharge home today in stable condition F/U in 2 weeks with Dr. Pryor Montes, [facility] 04/05/2016 7:17 AM --  Laboratory Lab Results:  Recent Labs  04/04/16 1610 04/05/16 0600  WBC 9.2 5.4  HGB 11.9* 13.7  HCT 35.8* 41.3  PLT 198 157   BMET  Recent Labs  04/03/16 1253 04/04/16 1610 04/05/16 0600  NA 136  --  139  K 3.8  --  3.8  CL 103  --  109  CO2 25  --  25  GLUCOSE 143*  --  120*  BUN 26*  --  17  CREATININE 1.17 1.32* 1.19  CALCIUM 9.2  --  8.5*    COAG Lab Results  Component Value Date   INR 1.04 04/04/2016   INR 0.96 08/16/2014   No results found for: PTT  I have interviewed the patient and examined the patient. I agree with the findings by the PA. Home today.  Gae Gallop, MD [phone]

## 2016-04-05 NOTE — Progress Notes (Signed)
DC instructions given to patient. Rx given to patient. Pt educated on wound care, restrictions and follow up appts. VSS. eICU and CCMD notified of discharge. PIV DC, hemostasis achieved. Pt escorted via volunteers to private vehicle driven by family.

## 2016-04-11 NOTE — Discharge Summary (Signed)
[facility] Discharge Summary   Patient ID:  Oscar Rose MRN: [id number] DOB/AGE: [birth year] 55 y.o.  Admit date: 04/04/2016 Discharge date: 04/11/2016 Date of Surgery: 04/04/2016 Surgeon: Surgeon(s): Angelia Mould, MD  Admission Diagnosis: Right carotid stenosis  Discharge Diagnoses:  Right carotid stenosis  Secondary Diagnoses: Past Medical History:  Diagnosis Date  . Arthritis   . At risk for sleep apnea    STOP-BANG= 7    SENT TO PCP 06-29-2014  . CKD (chronic kidney disease), stage II    montitored by nephrologist  . Depression       . Diabetic neuropathy ([place])   . GERD (gastroesophageal reflux disease)   . Headache    SINUS  . History of kidney stones   . History of retinal detachment   . Hyperlipidemia   . Hypertension   . 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 Neuropathy ([place])   . Restless leg syndrome   . Retained ureteral stent    w/ encrustation SINCE 2012  . Rotator cuff syndrome of right shoulder   . Stroke [facility])    ?  STROKE  JULY  2017  . Type 2 diabetes mellitus (HCC)     Procedure(s): ENDARTERECTOMY CAROTID ARTERY RIGHT PATCH ANGIOPLASTY RIGHT CAROTID ARTERY  Discharged Condition: good  HPI: Oscar Rose is a 55 y.o. male, who was referred as a new patient with a right carotid stenosis. The patient was admitted in July after he fell at home. Prior to falling he had been describing some intermittent episodes of left upper extremity weakness which would last several minutes and resolved. This had been going on for a week. Since his hospitalization he denies any new symptoms of weakness or paresthesias in his upper extremities or lower extremities. He denies any history of expressive or receptive aphasia or amaurosis fugax. He was not on aspirin prior to the event. He was on a statin. He has been started on aspirin.  I have reviewed the discharge summary from July of this  year. The patient was admitted on 02/13/2016 with a stroke. The patient was discharged to a skilled nursing facility on 02/16/2016. The patient had fallen at home and that's why they were admitted to the hospital. Workup included the duplex scan which is described below and shows a significant right carotid stenosis. CT of the head showed no intracranial hemorrhage. Was a suspicion for a right frontal infarct. MRI showed several areas of acute and early subacute infarcts within the right middle cerebral artery distribution.      Hospital Course:  Oscar Rose is a 55 y.o. male is S/P  Procedure(s): ENDARTERECTOMY CAROTID ARTERY RIGHT PATCH ANGIOPLASTY RIGHT CAROTID ARTERY Right neck incision soft without hematoma Smile symmetric, no tongue deviation Grip 5/5 equal B Heart RRR Lungs non labored breathing  Assessment/Planning: POD # 1 Right CEA  Plan for discharge home today in stable condition F/U in 2 weeks with Dr. Scot Dock    Significant Diagnostic Studies: CBC Lab Results  Component Value Date   WBC 5.4 04/05/2016   HGB 13.7 04/05/2016   HCT 41.3 04/05/2016   MCV 87.7 04/05/2016   PLT 157 04/05/2016    BMET    Component Value Date/Time   NA 139 04/05/2016 0600   K 3.8 04/05/2016 0600   CL 109 04/05/2016 0600   CO2 25 04/05/2016 0600   GLUCOSE 120 (H) 04/05/2016 0600  BUN 17 04/05/2016 0600   CREATININE 1.19 04/05/2016 0600   CALCIUM 8.5 (L) 04/05/2016 0600   GFRNONAA >60 04/05/2016 0600   GFRAA >60 04/05/2016 0600   COAG Lab Results  Component Value Date   INR 1.04 04/04/2016   INR 0.96 08/16/2014     Disposition:  Discharge to :Home Discharge Instructions    Call MD for:  redness, tenderness, or signs of infection (pain, swelling, bleeding, redness, odor or green/yellow discharge around incision site)    Complete by:  As directed   Call MD for:  severe or increased pain, loss or decreased feeling  in affected limb(s)    Complete by:  As directed    Call MD for:  temperature >100.5    Complete by:  As directed   Discharge instructions    Complete by:  As directed   You may shower in 24 hours   Discharge patient    Complete by:  As directed   Discharge pt to home   Driving Restrictions    Complete by:  As directed   No driving for 1 week   Increase activity slowly    Complete by:  As directed   Walk with assistance use walker or cane as needed   Lifting restrictions    Complete by:  As directed   No heavy lifting for 4 weeks   Resume previous diet    Complete by:  As directed       Medication List    STOP taking these medications   doxycycline 100 MG capsule Commonly known as:  VIBRAMYCIN     TAKE these medications   acetaminophen 500 MG tablet Commonly known as:  TYLENOL Take 1,000 mg by mouth every 6 (six) hours as needed for mild pain or moderate pain.   aspirin EC 81 MG tablet Take 81 mg by mouth daily.   Choline Fenofibrate 135 MG capsule Take 135 mg by mouth every evening.   dapagliflozin propanediol 10 MG Tabs tablet Commonly known as:  FARXIGA Take 10 mg by mouth every evening.   glipiZIDE 10 MG 24 hr tablet Commonly known as:  GLUCOTROL XL Take 10 mg by mouth 2 (two) times daily.   insulin glargine 100 UNIT/ML injection Commonly known as:  LANTUS Inject 100 Units into the skin at bedtime.   losartan 100 MG tablet Commonly known as:  COZAAR Take 100 mg by mouth every evening.   omeprazole 40 MG capsule Commonly known as:  PRILOSEC Take 40 mg by mouth daily.   oxyCODONE-acetaminophen 5-325 MG tablet Commonly known as:  PERCOCET/ROXICET Take 1 tablet by mouth every 6 (six) hours as needed for moderate pain.   pravastatin 80 MG tablet Commonly known as:  PRAVACHOL Take 80 mg by mouth every evening.      Verbal and written Discharge instructions given to the patient. Wound care per Discharge AVS   Signed: [facility] 04/11/2016, 8:17 AM --- For VQI Registry use --- Instructions:  Press F2 to tab through selections.  Delete question if not applicable.   Modified Rankin score at D/C (0-6): Rankin Score=0  IV medication needed for:  1. Hypertension: No 2. Hypotension: No  Post-op Complications: No  1. Post-op CVA or TIA: No  If yes: Event classification (right eye, left eye, right cortical, left cortical, verterobasilar, other):   If yes: Timing of event (intra-op, <6 hrs post-op, >=6 hrs post-op, unknown):   2. CN injury: No  If yes: CN  injuried  3. Myocardial infarction: No  If yes: Dx by (EKG or clinical, Troponin):   4.  CHF: No  5.  Dysrhythmia (new): No  6. Wound infection: No  7. Reperfusion symptoms: No  8. Return to OR: No  If yes: return to OR for (bleeding, neurologic, other CEA incision, other):   Discharge medications: Statin use:  Yes ASA use:  Yes Beta blocker use:  No  for medical reason   ACE-Inhibitor use:  No  for medical reason   P2Y12 Antagonist use: [ x] None, [ ]  Plavix, [ ]  Plasugrel, [ ]  Ticlopinine, [ ]  Ticagrelor, [ ]  Other, [ ]  No for medical reason, [ ]  Non-compliant, [ ]  Not-indicated Anti-coagulant use:  [x ] None, [ ]  Warfarin, [ ]  Rivaroxaban, [ ]  Dabigatran, [ ]  Other, [ ]  No for medical reason, [ ]  Non-compliant, [ ]  Not-indicated

## 2016-05-01 ENCOUNTER — Encounter: Payer: Medicare Other | Admitting: Vascular Surgery

## 2016-05-01 ENCOUNTER — Encounter (HOSPITAL_COMMUNITY): Payer: Medicare Other

## 2017-04-30 ENCOUNTER — Ambulatory Visit (INDEPENDENT_AMBULATORY_CARE_PROVIDER_SITE_OTHER): Payer: Medicare Other | Admitting: Neurology

## 2017-04-30 ENCOUNTER — Encounter: Payer: Self-pay | Admitting: Neurology

## 2017-04-30 VITALS — BP 142/80 | HR 88 | Ht 70.5 in | Wt 320.5 lb

## 2017-04-30 DIAGNOSIS — H8110 Benign paroxysmal vertigo, unspecified ear: Secondary | ICD-10-CM | POA: Diagnosis not present

## 2017-04-30 DIAGNOSIS — I639 Cerebral infarction, unspecified: Secondary | ICD-10-CM | POA: Diagnosis not present

## 2017-04-30 DIAGNOSIS — R42 Dizziness and giddiness: Secondary | ICD-10-CM | POA: Diagnosis not present

## 2017-04-30 NOTE — Progress Notes (Signed)
[facility]    Provider:  Dr Jaynee Eagles Referring Provider: Dione Housekeeper, MD Primary Care Physician:  Dione Housekeeper, MD  CC:  Vertigo  HPI:  Oscar Rose is a 56 y.o. male here as a referral from Dr. Edrick Oh for vertigo. He has a past medical history of hypertension, diabetes, depression, hyperlipidemia, cataract, kidney stones, toe amputation, upper back surgery and stroke.Patient had a stroke and 2017 due to symptomatic right carotid high-grade stenosis presented with left-sided weakness and numbness and left homonymous hemianopsia. Risk factors included hypertension, diabetes, dyslipidemia, untreated sleep apnea syndrome and obesity. He is status post carotid endarterectomy. Patient reports vertigo in bed when turning over. Would last a few minutes. Not moving helped, moving more slowly helped. Room spinning. No nausea or vomiting, episodes last a few minutes. Better with treatment of sinuses with nasal spray, may be due to sinus infection. Started worsening last month in the setting of sinus problems. No new numbness or new weakness or new vision or speech changes or other associated symptoms. He has untreated OSA. No headaches. No other focal neurologic deficits, associated symptoms, inciting events or modifiable factors.  Reviewed notes, labs and imaging from outside physicians, which showed:  MRI brain/MRA head : 1. Several areas of acute/early subacute infarction within the right MCA distribution as described corresponding to areas of hypoattenuation on the prior CT of the head. No evidence of intracranial hemorrhage. 2. No large vessel occlusion, high-grade stenosis, or aneurysm of the circle of Willis is identified.    Patient has intermittent dizziness, he has been using his nose spray, mostly positional when he turns over in bed, he almost fell out of bed early because of the vertigo, no heat headache no hearing loss. He's had this on and off over the past  year. About a year ago that his CVA was seen by gastroenterology, subsequently seen by neurosurgery. His hypertension is well controlled, blood sugars are much better, is followed by endocrinology. Vertiginous symptoms probably labyrinthitis. He was given Zofran and Diamox.  Patient had a stroke and 2017 due to symptomatic right carotid high-grade stenosis presented with left-sided weakness and numbness and left homonymous hemianopsia. Risk factors included hypertension, diabetes, dyslipidemia, untreated sleep apnea syndrome and obesity.  Review of Systems: Patient complains of symptoms per HPI as well as the following symptoms: Fatigue, swelling of legs, snoring, dizziness, insomnia, sleepiness, sedation, impotence. Pertinent negatives and positives per HPI. All others negative.   Social History   Social History  . Marital status: Single    Spouse name: N/A  . Number of children: 0  . Years of education: N/A   Occupational History  . Not on file.   Social History Main Topics  . Smoking status: Never Smoker  . Smokeless tobacco: Never Used  . Alcohol use Yes     Comment: Occassionally  . Drug use: No  . Sexual activity: Not on file   Other Topics Concern  . Not on file   Social History Narrative   Lives with "wife"    Family History  Problem Relation Age of Onset  . Cancer Mother        Throat  . Stroke Neg Hx     Past Medical History:  Diagnosis Date  . Arthritis   . At risk for sleep apnea    STOP-BANG= 7    SENT TO PCP 06-29-2014  . CKD (chronic kidney disease), stage II    montitored by nephrologist  . Depression       .  Diabetic neuropathy ([place])   . GERD (gastroesophageal reflux disease)   . Headache    SINUS  . History of kidney stones   . History of retinal detachment   . Hyperlipidemia   . Hypertension   . 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 Neuropathy   . Restless leg syndrome   . Retained  ureteral stent    w/ encrustation SINCE 2012  . Rotator cuff syndrome of right shoulder   . Stroke [facility])    ?  STROKE  JULY  2017  . Type 2 diabetes mellitus ([place])     Past Surgical History:  Procedure Laterality Date  . AMPUTATION Bilateral 2012   Left big toe partial and right big toe complete  . CARDIOVASCULAR STRESS TEST  04-05-2014  dr croitoru   low risk lexiscan nuclear study with mild diaphragmatic attenuation artifact/  no ischemia/  ef 58%  . CATARACT EXTRACTION W/ INTRAOCULAR LENS  IMPLANT, BILATERAL  2013  . CYSTO /  LEFT URETERAL STENT PLACEMENT  2012  . CYSTOSCOPY W/ URETERAL STENT REMOVAL Left 07/07/2014   Procedure: CYSTO WITH LEFT PORTION STENT REMOVAL;  Surgeon: Malka So, MD;  [facility];  Service: Urology;  Laterality: Left;  . CYSTOSCOPY WITH URETEROSCOPY AND STENT PLACEMENT Left 07/07/2014   Procedure: CYSTOLITHALOPAXY URETEROSCOPY WITH STENT;  Surgeon: Malka So, MD;  Location[facility];  Service: Urology;  Laterality: Left;  . ENDARTERECTOMY Right 04/04/2016   Procedure: ENDARTERECTOMY CAROTID ARTERY RIGHT;  Surgeon: Angelia Mould, MD;  Location: [place];  Service: Vascular;  Laterality: Right;  . HOLMIUM LASER APPLICATION N/A 27/0/3500   Procedure: HOLMIUM LASER APPLICATION;  Surgeon: Malka So, MD;  Location: [facility];  Service: Urology;  Laterality: N/A;  . I & D  INFECTED SPIDER BITE UPPER BACK  06/ 2012  . NEPHROLITHOTOMY Left 08/16/2014   Procedure: LEFT NEPHROLITHOTOMY PERCUTANEOUS;  Surgeon: Malka So, MD;  Location: [facility];  Service: Urology;  Laterality: Left;  . NEPHROLITHOTOMY Left 08/18/2014   Procedure: LEFT NEPHROLITHOTOMY PERCUTANEOUS SECOND LOOK;  Surgeon: Malka So, MD;  Location: [facility];  Service: Urology;  Laterality: Left;  . PATCH ANGIOPLASTY Right 04/04/2016   Procedure: PATCH ANGIOPLASTY RIGHT CAROTID ARTERY;  Surgeon: Angelia Mould, MD;  Location: [place];   Service: Vascular;  Laterality: Right;  . RETINAL DETACHMENT SURGERY Bilateral 2013  . TONSILLECTOMY AND ADENOIDECTOMY  as child    Current Outpatient Prescriptions  Medication Sig Dispense Refill  . acetaminophen (TYLENOL) 500 MG tablet Take 1,000 mg by mouth every 6 (six) hours as needed for mild pain or moderate pain.    Marland Kitchen acetaZOLAMIDE (DIAMOX) 125 MG tablet Take 1 tablet by mouth daily.    Marland Kitchen aspirin EC 81 MG tablet Take 81 mg by mouth daily.    . Choline Fenofibrate 135 MG capsule Take 135 mg by mouth every evening.     . Empagliflozin-Metformin HCl ER 12-998 MG TB24 Take 1 tablet by mouth 2 (two) times daily after a meal.    . escitalopram (LEXAPRO) 10 MG tablet Take 1 tablet by mouth daily.    . fenofibrate 160 MG tablet Take 1 tablet by mouth daily.    Marland Kitchen glipiZIDE (GLUCOTROL XL) 10 MG 24 hr tablet Take 10 mg by mouth 2 (two) times daily.     . insulin glargine (LANTUS) 100 UNIT/ML injection  Inject 48 Units into the skin at bedtime.     Marland Kitchen losartan (COZAAR) 100 MG tablet Take 100 mg by mouth every evening.     Marland Kitchen omeprazole (PRILOSEC) 40 MG capsule Take 40 mg by mouth daily.    . ondansetron (ZOFRAN) 4 MG tablet Take 1 tablet by mouth daily as needed.    . rosuvastatin (CRESTOR) 20 MG tablet Take 1 tablet by mouth daily.     No current facility-administered medications for this visit.     Allergies as of 04/30/2017  . (No Known Allergies)    Vitals: BP (!) 142/80   Pulse 88   Ht 5' 10.5" (1.791 m)   Wt (!) 320 lb 8 oz (145.4 kg)   BMI 45.34 kg/m  Last Weight:  Wt Readings from Last 1 Encounters:  04/30/17 (!) 320 lb 8 oz (145.4 kg)   Last Height:   Ht Readings from Last 1 Encounters:  04/30/17 5' 10.5" (1.791 m)   Physical exam: Exam: Gen: NAD, conversant, well nourised, morbidly obese, well groomed                     CV: RRR, no MRG. No Carotid Bruits. No peripheral edema, warm, nontender Eyes: Conjunctivae clear without exudates or hemorrhage  Neuro: Detailed  Neurologic Exam  Speech:    Speech is normal; fluent and spontaneous with normal comprehension.  Cognition:    The patient is oriented to person, place, and time;     recent and remote memory intact;     language fluent;     normal attention, concentration,     fund of knowledge Cranial Nerves:    The pupils are equal, round, and reactive to light. Attempted fundoscopic exam could not visualize Visual fields are impaired to finger confrontation in every quadrant. Extraocular movements are intact. Trigeminal sensation is intact and the muscles of mastication are normal. The face is symmetric. The palate elevates in the midline. Hearing intact. Voice is normal. Shoulder shrug is normal. The tongue has normal motion without fasciculations.   Coordination:    No dysmetria  Gait:    Wide balanced possibly due to extremely large body habitus  Motor Observation:    No asymmetry, no atrophy, and no involuntary movements noted. Tone:    Normal muscle tone.    Posture:    Posture is normal. normal erect    Strength:    Strength is V/V in the upper and lower limbs.      Sensation: intact to LT     Reflex Exam:  DTR's:    Absent AJS otherwise deep tendon reflexes in the upper and lower extremities are symmetrical bilaterally.   Toes:    Attempted but the toes are amputated bilaterally Clonus:    Clonus is absent.     Assessment/Plan:  Patient here for positional vertigo in bed when rolling over in the setting of nasal congestion. Has improved since starting nasal steroid. Patient had a stroke and 2017 due to symptomatic right carotid high-grade stenosis presented with left-sided weakness and numbness and left homonymous hemianopsia. Risk factors included hypertension, diabetes, dyslipidemia, untreated sleep apnea syndrome and obesity. He has untreated OSA, discussed severe sequelae including stroke, CV disease but he declines any more workup or using his cpap.  Likely BPPV but need to  rule out other etiologies.   - Patient needs follow up with Vascular s/p carotid Endardectomy and repeat vascular studiesas clinically warranted per vascular, asked patient to follow  up - MRI brain w/wo contrast to eval for causes of vertigo including stroke, schwannoma, tumor, lesions - If the symptoms resume recommend Physical Therapy vestibular therapy and ENT evaluation, he declines at this time  Orders Placed This Encounter  Procedures  . MR BRAIN W WO CONTRAST  . Comprehensive metabolic panel  . CBC   For any new symptoms or worsening symptoms or concerning symptoms call 911 or go to the emergency room  I had a long d/w patient about his past stroke, risk for recurrent stroke/TIAs, personally independently reviewed imaging studies and stroke evaluation results and answered questions. Recommend ASA 325mg  for secondary stroke prevention and maintain strict control of hypertension with blood pressure goal below 130/90, diabetes with hemoglobin A1c goal below 6.5% and lipids with LDL cholesterol goal below 70 mg/dL. I also advised the patient to eat a healthy diet with plenty of whole grains, cereals, fruits and vegetables, exercise regularly and maintain ideal body weight .Followup in the future as necessary.  Highly recommend treating OSA, he declines  Cc: Dr. Rogers Blocker, [facility] [address] [place], [name] [address]  Phone [phone] Fax [phone]

## 2017-04-30 NOTE — Patient Instructions (Signed)

## 2017-05-01 LAB — COMPREHENSIVE METABOLIC PANEL
A/G RATIO: 1.4 (ref 1.2–2.2)
ALK PHOS: 82 IU/L (ref 39–117)
ALT: 15 IU/L (ref 0–44)
AST: 15 IU/L (ref 0–40)
Albumin: 4.2 g/dL (ref 3.5–5.5)
BILIRUBIN TOTAL: 0.2 mg/dL (ref 0.0–1.2)
BUN / CREAT RATIO: 18 (ref 9–20)
BUN: 28 mg/dL — ABNORMAL HIGH (ref 6–24)
CHLORIDE: 103 mmol/L (ref 96–106)
CO2: 24 mmol/L (ref 20–29)
Calcium: 9.3 mg/dL (ref 8.7–10.2)
Creatinine, Ser: 1.52 mg/dL — ABNORMAL HIGH (ref 0.76–1.27)
GFR calc non Af Amer: 51 mL/min/{1.73_m2} — ABNORMAL LOW (ref >59)
GFR, EST AFRICAN AMERICAN: 59 mL/min/{1.73_m2} — AB (ref >59)
GLOBULIN, TOTAL: 3 g/dL (ref 1.5–4.5)
GLUCOSE: 126 mg/dL — AB (ref 65–99)
Potassium: 4.9 mmol/L (ref 3.5–5.2)
SODIUM: 139 mmol/L (ref 134–144)
Total Protein: 7.2 g/dL (ref 6.0–8.5)

## 2017-05-01 LAB — CBC
Hematocrit: 41.9 % (ref 37.5–51.0)
Hemoglobin: 14.1 g/dL (ref 13.0–17.7)
MCH: 29.4 pg (ref 26.6–33.0)
MCHC: 33.7 g/dL (ref 31.5–35.7)
MCV: 88 fL (ref 79–97)
PLATELETS: 249 10*3/uL (ref 150–379)
RBC: 4.79 x10E6/uL (ref 4.14–5.80)
RDW: 13.7 % (ref 12.3–15.4)
WBC: 7.5 10*3/uL (ref 3.4–10.8)

## 2017-05-05 ENCOUNTER — Telehealth: Payer: Self-pay

## 2017-05-05 NOTE — Telephone Encounter (Signed)
-----   Message from Melvenia Beam, MD sent at 05/05/2017  8:56 AM EDT ----- Elevated glucose and creatinine, this is chronic however and appears stable. thanks

## 2017-05-05 NOTE — Telephone Encounter (Signed)
I called pt. I advised him that his labs showed elevated glucose and creatinine, but this is chronic and appears to be stable. Pt verbalized understanding of results. Pt had no questions at this time but was encouraged to call back if questions arise.

## 2017-05-16 ENCOUNTER — Ambulatory Visit
Admission: RE | Admit: 2017-05-16 | Discharge: 2017-05-16 | Disposition: A | Discharge status: Home / Self Care | Payer: Medicare Other | Source: Ambulatory Visit | Attending: Neurology | Admitting: Neurology

## 2017-05-16 DIAGNOSIS — R42 Dizziness and giddiness: Secondary | ICD-10-CM | POA: Diagnosis not present

## 2017-05-16 DIAGNOSIS — I639 Cerebral infarction, unspecified: Secondary | ICD-10-CM

## 2017-05-16 MED ORDER — GADOBENATE DIMEGLUMINE 529 MG/ML IV SOLN
20.0000 mL | Freq: Once | INTRAVENOUS | Status: AC | PRN
  Administered 2017-05-16: 20 mL via INTRAVENOUS

## 2017-05-22 ENCOUNTER — Telehealth: Payer: Self-pay

## 2017-05-22 NOTE — Telephone Encounter (Signed)
Rn call patient about MRI results.No changes in the brain since 2017, There is a old strokes but nothing new (which is good) or any causes in the brain for his vertigo. However there is some sinus disease and some effusions in his mastoids(bone behind the ears) and wonder if this is a source of his vertigo. I suggest and ENT referral just to make sure. She would like to refer to Dr. Ernesto Rutherford if he does not have an ENT he already sees. PT verbalized understanding of results. He refused referral to ENT, Dr Ernesto Rutherford. Pt stated he will contact his PCP for a ENT if he wants to pursue it.

## 2017-05-22 NOTE — Telephone Encounter (Signed)
-----   Message from Melvenia Beam, MD sent at 05/20/2017  6:51 PM EDT ----- No changes in the brain since 2017, I see his old strokes but nothing new (which is good) or any causes in the brain for his vertigo. However, I do see some sinus disease and some effusions in his mastoids(bone behind the ears) and wonder if this is a source of his vertigo. I suggest and ENT referral just to make sure, I like to refer to Dr. Ernesto Rutherford if he does not have an ENT he already sees. Discuss with patient and order the referral to ENT Dr. Ernesto Rutherford or let m eknow and I can order it thanks

## 2019-04-13 ENCOUNTER — Emergency Department (HOSPITAL_COMMUNITY): Payer: Medicare Other

## 2019-04-13 ENCOUNTER — Other Ambulatory Visit: Payer: Self-pay

## 2019-04-13 ENCOUNTER — Encounter (HOSPITAL_COMMUNITY): Payer: Self-pay | Admitting: Emergency Medicine

## 2019-04-13 ENCOUNTER — Emergency Department (HOSPITAL_COMMUNITY)
Admission: EM | Admit: 2019-04-13 | Discharge: 2019-04-13 | Disposition: A | Discharge status: Home / Self Care | Payer: Medicare Other | Attending: Emergency Medicine | Admitting: Emergency Medicine

## 2019-04-13 DIAGNOSIS — N182 Chronic kidney disease, stage 2 (mild): Secondary | ICD-10-CM | POA: Insufficient documentation

## 2019-04-13 DIAGNOSIS — Z794 Long term (current) use of insulin: Secondary | ICD-10-CM | POA: Diagnosis not present

## 2019-04-13 DIAGNOSIS — I10 Essential (primary) hypertension: Secondary | ICD-10-CM

## 2019-04-13 DIAGNOSIS — R739 Hyperglycemia, unspecified: Secondary | ICD-10-CM

## 2019-04-13 DIAGNOSIS — Z7982 Long term (current) use of aspirin: Secondary | ICD-10-CM | POA: Insufficient documentation

## 2019-04-13 DIAGNOSIS — Z79899 Other long term (current) drug therapy: Secondary | ICD-10-CM | POA: Diagnosis not present

## 2019-04-13 DIAGNOSIS — E119 Type 2 diabetes mellitus without complications: Secondary | ICD-10-CM | POA: Insufficient documentation

## 2019-04-13 DIAGNOSIS — I129 Hypertensive chronic kidney disease with stage 1 through stage 4 chronic kidney disease, or unspecified chronic kidney disease: Secondary | ICD-10-CM | POA: Diagnosis not present

## 2019-04-13 DIAGNOSIS — Z9114 Patient's other noncompliance with medication regimen: Secondary | ICD-10-CM

## 2019-04-13 DIAGNOSIS — R42 Dizziness and giddiness: Secondary | ICD-10-CM | POA: Diagnosis present

## 2019-04-13 HISTORY — DX: Sleep apnea, unspecified: G47.30

## 2019-04-13 LAB — CBC WITH DIFFERENTIAL/PLATELET
Abs Immature Granulocytes: 0.03 10*3/uL (ref 0.00–0.07)
Basophils Absolute: 0 10*3/uL (ref 0.0–0.1)
Basophils Relative: 0 %
Eosinophils Absolute: 0 10*3/uL (ref 0.0–0.5)
Eosinophils Relative: 0 %
HCT: 42.8 % (ref 39.0–52.0)
Hemoglobin: 14.6 g/dL (ref 13.0–17.0)
Immature Granulocytes: 0 %
Lymphocytes Relative: 11 %
Lymphs Abs: 0.9 10*3/uL (ref 0.7–4.0)
MCH: 29.6 pg (ref 26.0–34.0)
MCHC: 34.1 g/dL (ref 30.0–36.0)
MCV: 86.6 fL (ref 80.0–100.0)
Monocytes Absolute: 0.3 10*3/uL (ref 0.1–1.0)
Monocytes Relative: 3 %
Neutro Abs: 6.6 10*3/uL (ref 1.7–7.7)
Neutrophils Relative %: 86 %
Platelets: 211 10*3/uL (ref 150–400)
RBC: 4.94 MIL/uL (ref 4.22–5.81)
RDW: 12.1 % (ref 11.5–15.5)
WBC: 7.8 10*3/uL (ref 4.0–10.5)
nRBC: 0 % (ref 0.0–0.2)

## 2019-04-13 LAB — URINALYSIS, ROUTINE W REFLEX MICROSCOPIC
Bilirubin Urine: NEGATIVE
Glucose, UA: >=500 mg/dL — AB
Ketones, ur: 5 mg/dL — AB
Leukocytes,Ua: NEGATIVE
Nitrite: NEGATIVE
Protein, ur: 100 mg/dL — AB
Specific Gravity, Urine: 1.021 (ref 1.005–1.030)
pH: 6 (ref 5.0–8.0)

## 2019-04-13 LAB — COMPREHENSIVE METABOLIC PANEL
ALT: 14 U/L (ref 0–44)
AST: 13 U/L — ABNORMAL LOW (ref 15–41)
Albumin: 3.6 g/dL (ref 3.5–5.0)
Alkaline Phosphatase: 100 U/L (ref 38–126)
Anion gap: 12 (ref 5–15)
BUN: 23 mg/dL — ABNORMAL HIGH (ref 6–20)
CO2: 21 mmol/L — ABNORMAL LOW (ref 22–32)
Calcium: 8.6 mg/dL — ABNORMAL LOW (ref 8.9–10.3)
Chloride: 99 mmol/L (ref 98–111)
Creatinine, Ser: 1.29 mg/dL — ABNORMAL HIGH (ref 0.61–1.24)
GFR calc Af Amer: >60 mL/min (ref >60)
GFR calc non Af Amer: >60 mL/min (ref >60)
Glucose, Bld: 357 mg/dL — ABNORMAL HIGH (ref 70–99)
Potassium: 3.9 mmol/L (ref 3.5–5.1)
Sodium: 132 mmol/L — ABNORMAL LOW (ref 135–145)
Total Bilirubin: 0.5 mg/dL (ref 0.3–1.2)
Total Protein: 6.7 g/dL (ref 6.5–8.1)

## 2019-04-13 LAB — CBG MONITORING, ED
Glucose-Capillary: 354 mg/dL — ABNORMAL HIGH (ref 70–99)
Glucose-Capillary: 318 mg/dL — ABNORMAL HIGH (ref 70–99)

## 2019-04-13 LAB — LACTIC ACID, PLASMA: Lactic Acid, Venous: 1.4 mmol/L (ref 0.5–1.9)

## 2019-04-13 LAB — TROPONIN I (HIGH SENSITIVITY)
Troponin I (High Sensitivity): 5 ng/L (ref <18)
Troponin I (High Sensitivity): 5 ng/L (ref <18)

## 2019-04-13 MED ORDER — SODIUM CHLORIDE 0.9 % IV BOLUS
1000.0000 mL | Freq: Once | INTRAVENOUS | Status: AC
  Administered 2019-04-13: 16:00:00 1000 mL via INTRAVENOUS

## 2019-04-13 MED ORDER — LABETALOL HCL 5 MG/ML IV SOLN
10.0000 mg | Freq: Once | INTRAVENOUS | Status: AC
  Administered 2019-04-13: 16:00:00 10 mg via INTRAVENOUS
  Filled 2019-04-13: qty 4

## 2019-04-13 MED ORDER — LABETALOL HCL 5 MG/ML IV SOLN
10.0000 mg | Freq: Once | INTRAVENOUS | Status: AC
  Administered 2019-04-13: 10 mg via INTRAVENOUS
  Filled 2019-04-13: qty 4

## 2019-04-13 MED ORDER — SODIUM CHLORIDE 0.9 % IV BOLUS
1000.0000 mL | Freq: Once | INTRAVENOUS | Status: AC
  Administered 2019-04-13: 1000 mL via INTRAVENOUS

## 2019-04-13 MED ORDER — ACETAMINOPHEN 500 MG PO TABS
1000.0000 mg | ORAL_TABLET | Freq: Once | ORAL | Status: AC
  Administered 2019-04-13: 1000 mg via ORAL
  Filled 2019-04-13: qty 2

## 2019-04-13 MED ORDER — ONDANSETRON HCL 4 MG/2ML IJ SOLN
4.0000 mg | Freq: Once | INTRAMUSCULAR | Status: AC
  Administered 2019-04-13: 17:00:00 4 mg via INTRAVENOUS
  Filled 2019-04-13: qty 2

## 2019-04-13 NOTE — ED Notes (Signed)
Patient given water to drink. Patient states he is still not feeling well. Patient appears pale. Last set of vital signs stable. Patient not able to drink a lot of water and still states nausea.

## 2019-04-13 NOTE — ED Triage Notes (Signed)
Patient suffered a fall after becoming dizzy with no LOC. Patent is hypertensive in route 200/98 with a BS of 429. Patient complains of pain in his head and  bilateral arms and legs.

## 2019-04-13 NOTE — ED Notes (Signed)
Cancel second Lactic acid per Tammy Triplett PA.

## 2019-04-13 NOTE — ED Notes (Signed)
Patient transported to CT 

## 2019-04-13 NOTE — Discharge Instructions (Addendum)
As discussed, it is very important for you to take your medications as prescribed by Dr. Edrick Oh.  I understand that he is recently retired and you will need a new medical provider.  Please refer to the suggestions above for establishing care with a new MD.  An additional option is the health and wellness center in [place], this would be a good option to ensure that you do not run out of your medications if there is delay in you obtaining a more local primary doctor.  Keep a close watch on your blood glucose levels.

## 2019-04-13 NOTE — ED Provider Notes (Signed)
Patient signed out to me by Kem Parkinson, PA-C pending his CT imaging.  CT is negative for acute intracranial process, no subacute stroke.  His blood pressure was greatly improved after the second dose of labetalol with a blood pressure 140/54.  He has received 2 L of IV fluids, CBG is improving, he has no anion gap.  He was given a fluid challenge.  Also complaint of continued headache, also endorsing nasal congestion, stating "I have a cold".  He was given Tylenol for his headache.  Patient's PCP Dr. Edrick Oh has recently retired, clinic closed.  He does have his home medications but has been noncompliant.  He was encouraged to take his medications as prescribed while awaiting establishing with a new PCP.  He was given referrals for this.   Evalee Jefferson, PA-C 04/14/19 0116    Virgel Manifold, MD 04/18/19 1240

## 2019-04-13 NOTE — ED Provider Notes (Signed)
[facility] EMERGENCY DEPARTMENT Provider Note   CSN: [id number] Arrival date & time: 04/13/19  1447     History   Chief Complaint Chief Complaint  Patient presents with  . Hypertension    HPI Oscar Rose is a 58 y.o. male.      HPI  Oscar Rose is a 58 y.o. male with a history of stage II CKD, diabetes, hypertension, hyperlipidemia and stroke (2017) who presents to the Emergency Department complaining of dizziness earlier today and fall.  He states that he was not feeling well this morning and became dizzy while sitting at is computer and fell out of his chair.  No loss of consciousness.  He complains of left-sided headache today and one episode of vomiting prior to arrival.  He contacted EMS and was noted to be hypertensive in route with blood pressure of 200/98 and blood sugar of 429.  He complained initially of weakness to his right side, but then stated it was left-sided weakness.  He denies chest pain or shortness of breath.  He admits that he has not been taking his medications regularly and has missed several doses.  He cannot recall when he last took his antihypertensive or his diabetes medications.  He denies back or neck pain, hip or knee pain.     Past Medical History:  Diagnosis Date  . Arthritis   . At risk for sleep apnea    STOP-BANG= 7    SENT TO PCP 06-29-2014  . CKD (chronic kidney disease), stage II    montitored by nephrologist  . Depression       . Diabetic neuropathy ([place])   . GERD (gastroesophageal reflux disease)   . Headache    SINUS  . History of kidney stones   . History of retinal detachment   . Hyperlipidemia   . Hypertension   . 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 Neuropathy   . Restless leg syndrome   . Retained ureteral stent    w/ encrustation SINCE 2012  . Rotator cuff syndrome of right shoulder   . Sleep apnea in adult   . Stroke [facility])    ?  STROKE  JULY  2017  . Type 2  diabetes mellitus [facility])     Patient Active Problem List   Diagnosis Date Noted  . BPPV (benign paroxysmal positional vertigo) 04/30/2017  . Carotid aneurysm, right ([place]) 04/04/2016  . Cerebral infarction due to unspecified mechanism   . Essential hypertension   . CVA (cerebral infarction) 02/13/2016  . Dehydration 02/13/2016  . Fall at home 02/13/2016  . Hyponatremia 02/13/2016  . Encephalopathy 02/13/2016  . Hypertension   . Type 2 diabetes mellitus ([place])   . Legal blindness of left eye, as defined in [place]   . CKD (chronic kidney disease), stage II   . Renal stone 08/16/2014  . Precordial pain 03/23/2014  . Impingement syndrome of shoulder 02/02/2013    Past Surgical History:  Procedure Laterality Date  . AMPUTATION Bilateral 2012   Left big toe partial and right big toe complete  . CARDIOVASCULAR STRESS TEST  04-05-2014  dr croitoru   low risk lexiscan nuclear study with mild diaphragmatic attenuation artifact/  no ischemia/  ef 58%  . CATARACT EXTRACTION W/ INTRAOCULAR LENS  IMPLANT, BILATERAL  2013  . CYSTO /  LEFT URETERAL STENT PLACEMENT  2012  . CYSTOSCOPY W/ URETERAL STENT REMOVAL Left 07/07/2014  Procedure: CYSTO WITH LEFT PORTION STENT REMOVAL;  Surgeon: Malka So, MD;  Location: [facility];  Service: Urology;  Laterality: Left;  . CYSTOSCOPY WITH URETEROSCOPY AND STENT PLACEMENT Left 07/07/2014   Procedure: CYSTOLITHALOPAXY URETEROSCOPY WITH STENT;  Surgeon: Malka So, MD;  Location: [facility];  Service: Urology;  Laterality: Left;  . ENDARTERECTOMY Right 04/04/2016   Procedure: ENDARTERECTOMY CAROTID ARTERY RIGHT;  Surgeon: Angelia Mould, MD;  Location: [place];  Service: Vascular;  Laterality: Right;  . HOLMIUM LASER APPLICATION N/A [id number]   Procedure: HOLMIUM LASER APPLICATION;  Surgeon: Malka So, MD;  Location: [facility];  Service: Urology;  Laterality: N/A;  . I & D  INFECTED SPIDER BITE UPPER  BACK  06/ 2012  . NEPHROLITHOTOMY Left 08/16/2014   Procedure: LEFT NEPHROLITHOTOMY PERCUTANEOUS;  Surgeon: Malka So, MD;  Location: [facility];  Service: Urology;  Laterality: Left;  . NEPHROLITHOTOMY Left 08/18/2014   Procedure: LEFT NEPHROLITHOTOMY PERCUTANEOUS SECOND LOOK;  Surgeon: Malka So, MD;  Location: [facility];  Service: Urology;  Laterality: Left;  . PATCH ANGIOPLASTY Right 04/04/2016   Procedure: PATCH ANGIOPLASTY RIGHT CAROTID ARTERY;  Surgeon: Angelia Mould, MD;  Location: [place];  Service: Vascular;  Laterality: Right;  . RETINAL DETACHMENT SURGERY Bilateral 2013  . TONSILLECTOMY AND ADENOIDECTOMY  as child        Home Medications    Prior to Admission medications   Medication Sig Start Date End Date Taking? Authorizing Provider  acetaminophen (TYLENOL) 500 MG tablet Take 1,000 mg by mouth every 6 (six) hours as needed for mild pain or moderate pain.    [provider]  acetaZOLAMIDE (DIAMOX) 125 MG tablet Take 1 tablet by mouth daily. 03/14/17   [provider]  aspirin EC 81 MG tablet Take 81 mg by mouth daily.    [provider]  Choline Fenofibrate 135 MG capsule Take 135 mg by mouth every evening.     [provider]  Empagliflozin-Metformin HCl ER 12-998 MG TB24 Take 1 tablet by mouth 2 (two) times daily after a meal.    [provider]  escitalopram (LEXAPRO) 10 MG tablet Take 1 tablet by mouth daily. 01/29/17 01/29/18  [provider]  glipiZIDE (GLUCOTROL XL) 10 MG 24 hr tablet Take 10 mg by mouth 2 (two) times daily.     [provider]  insulin glargine (LANTUS) 100 UNIT/ML injection Inject 48 Units into the skin at bedtime.     [provider]  losartan (COZAAR) 100 MG tablet Take 100 mg by mouth every evening.  03/21/16   [provider]  omeprazole (PRILOSEC) 40 MG capsule Take 40 mg by mouth daily. 03/04/16   [provider]  ondansetron (ZOFRAN) 4 MG tablet Take 1  tablet by mouth daily as needed. 03/14/17   [provider]    Family History Family History  Problem Relation Age of Onset  . Cancer Mother        Throat  . Stroke Neg Hx     Social History Social History   Tobacco Use  . Smoking status: Never Smoker  . Smokeless tobacco: Never Used  Substance Use Topics  . Alcohol use: Yes    Comment: Occassionally  . Drug use: No     Allergies   Patient has no known allergies.   Review of Systems Review of Systems  Constitutional: Negative for activity change, appetite change and fever.  HENT: Negative for facial swelling and trouble swallowing.   Eyes: Positive for photophobia. Negative for pain and visual disturbance.  Respiratory: Negative for chest tightness and shortness of breath.   Cardiovascular: Negative for chest pain.  Gastrointestinal: Positive for nausea and vomiting. Negative for abdominal pain.  Genitourinary: Negative for decreased urine volume and dysuria.  Musculoskeletal: Negative for arthralgias, back pain, neck pain and neck stiffness.  Skin: Negative for rash and wound.  Neurological: Positive for dizziness and headaches. Negative for syncope, facial asymmetry, speech difficulty, weakness and numbness.  Psychiatric/Behavioral: Negative for confusion and decreased concentration.     Physical Exam Updated Vital Signs BP (!) 217/91   Pulse 82   Temp 97.8 F (36.6 C) (Oral)   Resp (!) 23   Ht 5\' 10"  (1.778 m)   Wt (!) 145.2 kg   SpO2 95%   BMI 45.92 kg/m   Physical Exam Vitals signs and nursing note reviewed.  Constitutional:      Appearance: He is not toxic-appearing.  HENT:     Head: Atraumatic.     Mouth/Throat:     Mouth: Mucous membranes are dry.     Comments: Mucous membranes are very dry Eyes:     Extraocular Movements: Extraocular movements intact.     Conjunctiva/sclera: Conjunctivae normal.     Pupils: Pupils are equal, round, and reactive to light.  Neck:      Musculoskeletal: Normal range of motion. No muscular tenderness.  Cardiovascular:     Rate and Rhythm: Normal rate and regular rhythm.     Pulses: Normal pulses.  Pulmonary:     Effort: Pulmonary effort is normal. No respiratory distress.     Breath sounds: Normal breath sounds.  Abdominal:     General: There is no distension.     Palpations: Abdomen is soft.     Tenderness: There is no abdominal tenderness.  Musculoskeletal:        General: No swelling, tenderness or signs of injury.  Lymphadenopathy:     Cervical: No cervical adenopathy.  Skin:    General: Skin is warm.     Capillary Refill: Capillary refill takes less than 2 seconds.     Findings: No bruising or rash.  Neurological:     Mental Status: He is alert.     GCS: GCS eye subscore is 4. GCS verbal subscore is 5. GCS motor subscore is 6.     Sensory: Sensation is intact.     Motor: Motor function is intact. No weakness, abnormal muscle tone or pronator drift.     Coordination: Coordination is intact.     Comments: CN II-XII intact.  Speech is slow, but clear.  No dysarthria.  no pronator drift.  nml finger nose testing.        ED Treatments / Results  Labs (all labs ordered are listed, but only abnormal results are displayed) Labs Reviewed  CBG MONITORING, ED - Abnormal; Notable for the following components:      Result Value   Glucose-Capillary 354 (*)    All other components within normal limits  CBC WITH DIFFERENTIAL/PLATELET  COMPREHENSIVE METABOLIC PANEL  URINALYSIS, ROUTINE W REFLEX MICROSCOPIC  LACTIC ACID, PLASMA  LACTIC ACID, PLASMA  TROPONIN I (HIGH SENSITIVITY)    EKG None  Radiology No results found.  Procedures Procedures (including critical care time)  Medications Ordered in ED Medications  sodium chloride 0.9 % bolus 1,000 mL (1,000 mLs Intravenous New Bag/Given 04/13/19 1558)  labetalol (NORMODYNE) injection 10  mg (10 mg Intravenous Given 04/13/19 1612)     Initial Impression /  Assessment and Plan / ED Course  I have reviewed the triage vital signs and the nursing notes.  Pertinent labs & imaging results that were available during my care of the patient were reviewed by me and considered in my medical decision making (see chart for details).        Pt with generalized dizziness and fall earlier today.  He notes he has been noncompliant with his medications recently.  Initially reported right-sided weakness but then changed history to left-sided weakness.  No focal neuro deficits on my exam.  Clinically, symptoms are likely metabolic and doubtful of acute stroke.  Will obtain labs and CT head.  IV labetalol ordered for his hypertension.    1650  On recheck, blood pressure improving, now [id number] systolic.  [id number]  Labs and CT head still pending.  Discussed pt findings with Evalee Jefferson, PA-C who assumes care.      Final Clinical Impressions(s) / ED Diagnoses   Final diagnoses:  None    ED Discharge Orders    None       Kem Parkinson, PA-C 04/13/19 1721    Virgel Manifold, MD 04/18/19 1243

## 2021-10-10 ENCOUNTER — Ambulatory Visit: Payer: Medicare Other | Admitting: Nurse Practitioner

## 2021-10-17 ENCOUNTER — Other Ambulatory Visit: Payer: Self-pay

## 2021-10-17 ENCOUNTER — Encounter: Payer: Self-pay | Admitting: Vascular Surgery

## 2021-10-17 ENCOUNTER — Ambulatory Visit (INDEPENDENT_AMBULATORY_CARE_PROVIDER_SITE_OTHER): Payer: Medicare Other | Admitting: Vascular Surgery

## 2021-10-17 VITALS — BP 147/84 | HR 84 | Ht 70.5 in | Wt 267.0 lb

## 2021-10-17 DIAGNOSIS — I70222 Atherosclerosis of native arteries of extremities with rest pain, left leg: Secondary | ICD-10-CM | POA: Diagnosis not present

## 2021-10-17 DIAGNOSIS — I739 Peripheral vascular disease, unspecified: Secondary | ICD-10-CM

## 2021-10-17 NOTE — Progress Notes (Signed)
[facility] [place]  Patient name: Oscar Rose MRN: [id number] DOB: January 21, 1961 Sex: male  REASON FOR CONSULT: Evaluation nonhealing left transmetatarsal amputation  HPI: Oscar Rose is a 61 y.o. male, who is here today for evaluation of poorly healing left transmetatarsal amputation.  He has been followed at the [facility] wound center.  He presented with gangrenous changes subsequently underwent transmetatarsal amputation.  He had presented to the emergency department at [facility] on 09/25/2021.  He was found to have renal insufficiency with creatinine of 3.67 on admission which was up from 1.89 at his baseline on 09/05/2021.  He has an open transmetatarsal amputation with very slow healing.  Also has a sinus track in the more proximal arch of his foot.  This is being packed as well.  He has peripheral neuropathy.  He has minimal feeling in his feet bilaterally.  He does have care currently at [facility] nursing facility.  He is known to our practice from a prior carotid endarterectomy by Dr. 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  STROKE  JULY  2017   Type 2 diabetes mellitus (HCC)     Family History  Problem Relation Age of Onset   Cancer Mother         Throat   Stroke Neg Hx     SOCIAL HISTORY: Social History   Socioeconomic History   Marital status: Single    Spouse name: Not on file   Number of children: 0   Years of education: Not on file   Highest education level: Not on file  Occupational History   Not on file  Tobacco Use   Smoking status: Never   Smokeless tobacco: Never  Vaping Use   Vaping Use: Never used  Substance and Sexual Activity   Alcohol use: Yes    Comment: Occassionally   Drug use: No   Sexual activity: Not on file  Other Topics Concern   Not on file  Social History Narrative   Lives with "wife"   Social Determinants of Health   Financial Resource Strain: Not on file  Food Insecurity: Not on file  Transportation Needs: Not on file  Physical Activity: Not on file  Stress: Not on file  Social Connections: Not on file  Intimate Partner Violence: Not on file    No Known Allergies  Current Outpatient Medications  Medication Sig Dispense Refill   acetaminophen (TYLENOL) 500 MG tablet Take 1,000 mg by mouth every 6 (six) hours as needed for mild pain or moderate pain.  Amino Acids-Protein Hydrolys (PRO-STAT PO) Take by mouth. 30 ML BID     amLODipine (NORVASC) 10 MG tablet Take 10 mg by mouth daily.     aspirin EC 81 MG tablet Take 81 mg by mouth daily.     atorvastatin (LIPITOR) 40 MG tablet Take 40 mg by mouth daily.     CIPROFLOXACIN IV Inject 200 mLs into the vein. Q 12 hours until 11/09/2021     collagenase (SANTYL) 250 UNIT/GM ointment Apply 1 application. topically daily. Pply to lt plantar foot topically q day     heparin 100 units/mL SOLN Inject into the vein once. 3 ML BU IV BID     insulin glargine (LANTUS) 100 UNIT/ML injection Inject 40 Units into the skin at bedtime.     meclizine (ANTIVERT) 25 MG tablet Take 25 mg by mouth 3 (three) times daily as needed for dizziness.     meropenem (MERREM) IVPB Inject into the vein. 500 MG/50 ML Q 8 HOURS PRN     Multiple Vitamin  (MULTIVITAMIN) tablet Take 1 tablet by mouth daily.     oxycodone (OXY-IR) 5 MG capsule Take 5 mg by mouth every 8 (eight) hours as needed.     Sodium Chloride Flush (SALINE FLUSH IV) Inject into the vein. 0.9 % USE 10 ML BY IV Q 12 HOURS     sodium hypochlorite (DAKIN'S 1/2 STRENGTH) external solution Irrigate with 1 application. as directed once.     vitamin C (ASCORBIC ACID) 500 MG tablet Take 500 mg by mouth 2 (two) times daily.     zinc gluconate 50 MG tablet Take 50 mg by mouth daily.     zinc sulfate 220 (50 Zn) MG capsule Take 220 mg by mouth daily.     acetaZOLAMIDE (DIAMOX) 125 MG tablet Take 1 tablet by mouth daily.     Choline Fenofibrate 135 MG capsule Take 135 mg by mouth every evening.      Empagliflozin-Metformin HCl ER 12-998 MG TB24 Take 1 tablet by mouth 2 (two) times daily after a meal.     glipiZIDE (GLUCOTROL XL) 10 MG 24 hr tablet Take 10 mg by mouth 2 (two) times daily.      losartan (COZAAR) 100 MG tablet Take 100 mg by mouth every evening.      omeprazole (PRILOSEC) 40 MG capsule Take 40 mg by mouth daily.     ondansetron (ZOFRAN) 4 MG tablet Take 1 tablet by mouth daily as needed.     No current facility-administered medications for this visit.    REVIEW OF SYSTEMS:  [X]  denotes positive finding, [ ]  denotes negative finding Cardiac  Comments:  Chest pain or chest pressure:    Shortness of breath upon exertion:    Short of breath when lying flat:    Irregular heart rhythm:        Vascular    Pain in calf, thigh, or hip brought on by ambulation:    Pain in feet at night that wakes you up from your sleep:     Blood clot in your veins:    Leg swelling:         Pulmonary    Oxygen at home:    Productive cough:     Wheezing:         Neurologic    Sudden weakness in arms or legs:     Sudden numbness in arms or legs:     Sudden onset of difficulty speaking or slurred speech:  Temporary loss of vision in one eye:     Problems with dizziness:          Gastrointestinal    Blood in stool:     Vomited blood:         Genitourinary    Burning when urinating:     Blood in urine:        Psychiatric    Major depression:         Hematologic    Bleeding problems:    Problems with blood clotting too easily:        Skin    Rashes or ulcers:        Constitutional    Fever or chills:      PHYSICAL EXAM: Vitals:   10/17/21 0920  BP: (!) 147/84  Pulse: 84  Weight: 267 lb (121.1 kg)  Height: 5' 10.5" (1.791 m)    GENERAL: The patient is a well-nourished male, in no acute distress. The vital signs are documented above. CARDIOVASCULAR: 2+ radial pulses and 2+ popliteal pulses bilaterally.  I do not palpate pedal pulses bilaterally. PULMONARY: There is good air exchange  MUSCULOSKELETAL: There are no major deformities or cyanosis. NEUROLOGIC: No focal weakness or paresthesias are detected. SKIN: Open transmetatarsal amputation on the left.  Very poor granulation tissue on the superior flap and fat necrosis on the inferior flap.  There is also sinus tract in the arch of the foot.  I do not appreciate any fluctuance. PSYCHIATRIC: The patient has a normal affect.  DATA:  Noninvasive studies from [facility] in February revealed monophasic flow bilaterally with ankle arm index in the 65 to 70% range bilaterally  MEDICAL ISSUES: Had long discussion with the patient.  Feel that he is very high risk for left above-knee amputation related to the tissue loss and tracking in the arch of his foot.  He does have normal flow to the popliteal level so suspect he has severe tibial disease.  I have recommended arteriography for further evaluation.  Determine if there is any possibility for improved arterial flow to his foot due to increase chances of limb salvage.  I did discuss his renal insufficiency and that we would limit iodinated contrast with probably mixture of CO2 and contrast.  We will coordinate this as an outpatient as soon as  possible.   Larina Earthly, MD FACS [facility] [phone] Pager [phone]  Note: Portions of this report may have been transcribed using voice recognition software.  Every effort has been made to ensure accuracy; however, inadvertent computerized transcription errors may still be present.

## 2021-10-30 ENCOUNTER — Telehealth: Payer: Self-pay

## 2021-10-30 ENCOUNTER — Other Ambulatory Visit: Payer: Self-pay

## 2021-10-30 NOTE — Telephone Encounter (Signed)
Have been attempting to reach [facility] since 10/18/21 to schedule patient for aortogram with runoff and possible left tibial intervention, but either call would ring continuously or received message stating call cannot be completed as dialed.  ? ?Today, reached [name] at [facility] and scheduled pt for procedure on 11/08/21, per transportation's next available date. Instructions reviewed and will be faxed to [phone]. ?

## 2021-11-08 ENCOUNTER — Ambulatory Visit (HOSPITAL_COMMUNITY)
Admission: RE | Admit: 2021-11-08 | Discharge: 2021-11-08 | Disposition: A | Discharge status: Home / Self Care | Payer: Medicare Other | Attending: Vascular Surgery | Admitting: Vascular Surgery

## 2021-11-08 ENCOUNTER — Encounter (HOSPITAL_COMMUNITY): Payer: Self-pay | Admitting: Vascular Surgery

## 2021-11-08 ENCOUNTER — Other Ambulatory Visit: Payer: Self-pay

## 2021-11-08 ENCOUNTER — Encounter (HOSPITAL_COMMUNITY)
Admission: RE | Disposition: A | Discharge status: Home / Self Care | Payer: Self-pay | Source: Home / Self Care | Attending: Vascular Surgery

## 2021-11-08 DIAGNOSIS — Z89422 Acquired absence of other left toe(s): Secondary | ICD-10-CM | POA: Diagnosis not present

## 2021-11-08 DIAGNOSIS — E11621 Type 2 diabetes mellitus with foot ulcer: Secondary | ICD-10-CM | POA: Diagnosis not present

## 2021-11-08 DIAGNOSIS — E1151 Type 2 diabetes mellitus with diabetic peripheral angiopathy without gangrene: Secondary | ICD-10-CM | POA: Insufficient documentation

## 2021-11-08 DIAGNOSIS — I129 Hypertensive chronic kidney disease with stage 1 through stage 4 chronic kidney disease, or unspecified chronic kidney disease: Secondary | ICD-10-CM | POA: Insufficient documentation

## 2021-11-08 DIAGNOSIS — Z7984 Long term (current) use of oral hypoglycemic drugs: Secondary | ICD-10-CM | POA: Insufficient documentation

## 2021-11-08 DIAGNOSIS — Z794 Long term (current) use of insulin: Secondary | ICD-10-CM | POA: Diagnosis not present

## 2021-11-08 DIAGNOSIS — Z7982 Long term (current) use of aspirin: Secondary | ICD-10-CM | POA: Diagnosis not present

## 2021-11-08 DIAGNOSIS — L97529 Non-pressure chronic ulcer of other part of left foot with unspecified severity: Secondary | ICD-10-CM | POA: Diagnosis not present

## 2021-11-08 DIAGNOSIS — E1122 Type 2 diabetes mellitus with diabetic chronic kidney disease: Secondary | ICD-10-CM | POA: Diagnosis not present

## 2021-11-08 DIAGNOSIS — Z79899 Other long term (current) drug therapy: Secondary | ICD-10-CM | POA: Diagnosis not present

## 2021-11-08 DIAGNOSIS — T8781 Dehiscence of amputation stump: Secondary | ICD-10-CM | POA: Diagnosis not present

## 2021-11-08 DIAGNOSIS — I70245 Atherosclerosis of native arteries of left leg with ulceration of other part of foot: Secondary | ICD-10-CM | POA: Insufficient documentation

## 2021-11-08 DIAGNOSIS — E1142 Type 2 diabetes mellitus with diabetic polyneuropathy: Secondary | ICD-10-CM | POA: Diagnosis not present

## 2021-11-08 DIAGNOSIS — N182 Chronic kidney disease, stage 2 (mild): Secondary | ICD-10-CM | POA: Diagnosis not present

## 2021-11-08 HISTORY — PX: ABDOMINAL AORTOGRAM W/LOWER EXTREMITY: CATH118223

## 2021-11-08 HISTORY — PX: PERIPHERAL VASCULAR BALLOON ANGIOPLASTY: CATH118281

## 2021-11-08 LAB — POCT I-STAT, CHEM 8
BUN: 31 mg/dL — ABNORMAL HIGH (ref 6–20)
Calcium, Ion: 1.14 mmol/L — ABNORMAL LOW (ref 1.15–1.40)
Chloride: 102 mmol/L (ref 98–111)
Creatinine, Ser: 2.2 mg/dL — ABNORMAL HIGH (ref 0.61–1.24)
Glucose, Bld: 125 mg/dL — ABNORMAL HIGH (ref 70–99)
HCT: 36 % — ABNORMAL LOW (ref 39.0–52.0)
Hemoglobin: 12.2 g/dL — ABNORMAL LOW (ref 13.0–17.0)
Potassium: 4.3 mmol/L (ref 3.5–5.1)
Sodium: 136 mmol/L (ref 135–145)
TCO2: 27 mmol/L (ref 22–32)

## 2021-11-08 LAB — GLUCOSE, CAPILLARY: Glucose-Capillary: 114 mg/dL — ABNORMAL HIGH (ref 70–99)

## 2021-11-08 SURGERY — ABDOMINAL AORTOGRAM W/LOWER EXTREMITY
Anesthesia: LOCAL

## 2021-11-08 MED ORDER — SODIUM CHLORIDE 0.9 % IV SOLN: INTRAVENOUS | Status: DC

## 2021-11-08 MED ORDER — MIDAZOLAM HCL 2 MG/2ML IJ SOLN
INTRAMUSCULAR | Status: DC | PRN
  Administered 2021-11-08: 1 mg via INTRAVENOUS

## 2021-11-08 MED ORDER — LIDOCAINE HCL (PF) 1 % IJ SOLN
INTRAMUSCULAR | Status: DC | PRN
  Administered 2021-11-08: 12 mL

## 2021-11-08 MED ORDER — CLOPIDOGREL BISULFATE 75 MG PO TABS: 75.0000 mg | ORAL_TABLET | Freq: Every day | ORAL | 11 refills | Status: AC

## 2021-11-08 MED ORDER — SODIUM CHLORIDE 0.9% FLUSH
3.0000 mL | INTRAVENOUS | Status: DC | PRN
Start: 2021-11-08 — End: 2021-11-08

## 2021-11-08 MED ORDER — LIDOCAINE HCL (PF) 1 % IJ SOLN
INTRAMUSCULAR | Status: AC
  Filled 2021-11-08: qty 30

## 2021-11-08 MED ORDER — FENTANYL CITRATE (PF) 100 MCG/2ML IJ SOLN
INTRAMUSCULAR | Status: AC
  Filled 2021-11-08: qty 2

## 2021-11-08 MED ORDER — IODIXANOL 320 MG/ML IV SOLN
INTRAVENOUS | Status: DC | PRN
  Administered 2021-11-08: 20 mL

## 2021-11-08 MED ORDER — HEPARIN (PORCINE) IN NACL 1000-0.9 UT/500ML-% IV SOLN
INTRAVENOUS | Status: DC | PRN
  Administered 2021-11-08 (×2): 500 mL

## 2021-11-08 MED ORDER — FENTANYL CITRATE (PF) 100 MCG/2ML IJ SOLN
INTRAMUSCULAR | Status: DC | PRN
  Administered 2021-11-08 (×2): 25 ug via INTRAVENOUS

## 2021-11-08 MED ORDER — CLOPIDOGREL BISULFATE 300 MG PO TABS
ORAL_TABLET | ORAL | Status: DC | PRN
Start: 2021-11-08 — End: 2021-11-08
  Administered 2021-11-08: 300 mg via ORAL

## 2021-11-08 MED ORDER — ONDANSETRON HCL 4 MG/2ML IJ SOLN: 4.0000 mg | Freq: Four times a day (QID) | INTRAMUSCULAR | Status: DC | PRN

## 2021-11-08 MED ORDER — CLOPIDOGREL BISULFATE 75 MG PO TABS: 300.0000 mg | ORAL_TABLET | Freq: Once | ORAL | Status: DC

## 2021-11-08 MED ORDER — ACETAMINOPHEN 325 MG PO TABS: 650.0000 mg | ORAL_TABLET | ORAL | Status: DC | PRN

## 2021-11-08 MED ORDER — SODIUM CHLORIDE 0.9% FLUSH: 3.0000 mL | Freq: Two times a day (BID) | INTRAVENOUS | Status: DC

## 2021-11-08 MED ORDER — MIDAZOLAM HCL 2 MG/2ML IJ SOLN
INTRAMUSCULAR | Status: AC
  Filled 2021-11-08: qty 2

## 2021-11-08 MED ORDER — HEPARIN (PORCINE) IN NACL 1000-0.9 UT/500ML-% IV SOLN
INTRAVENOUS | Status: AC
  Filled 2021-11-08: qty 1000

## 2021-11-08 MED ORDER — CLOPIDOGREL BISULFATE 300 MG PO TABS
ORAL_TABLET | ORAL | Status: AC
  Filled 2021-11-08: qty 1

## 2021-11-08 MED ORDER — LABETALOL HCL 5 MG/ML IV SOLN: 10.0000 mg | INTRAVENOUS | Status: DC | PRN

## 2021-11-08 MED ORDER — CLOPIDOGREL BISULFATE 75 MG PO TABS: 75.0000 mg | ORAL_TABLET | Freq: Every day | ORAL | Status: DC

## 2021-11-08 MED ORDER — SODIUM CHLORIDE 0.9 % IV SOLN: 250.0000 mL | INTRAVENOUS | Status: DC | PRN

## 2021-11-08 MED ORDER — HEPARIN SODIUM (PORCINE) 1000 UNIT/ML IJ SOLN
INTRAMUSCULAR | Status: DC | PRN
Start: 2021-11-08 — End: 2021-11-08
  Administered 2021-11-08: 12000 [IU] via INTRAVENOUS

## 2021-11-08 MED ORDER — HYDRALAZINE HCL 20 MG/ML IJ SOLN: 5.0000 mg | INTRAMUSCULAR | Status: DC | PRN

## 2021-11-08 SURGICAL SUPPLY — 21 items
BALLN COYOTE OTW 3X220X150 (BALLOONS) ×3
BALLOON COYOTE OTW 3X220X150 (BALLOONS) IMPLANT
CATH OMNI FLUSH 5F 65CM (CATHETERS) ×1 IMPLANT
CATH QUICKCROSS .018X135CM (MICROCATHETER) ×1 IMPLANT
CATH QUICKCROSS ANG SELECT (CATHETERS) ×1 IMPLANT
CATH TEMPO AQUA 5F 100CM (CATHETERS) ×1 IMPLANT
DEVICE CLOSURE MYNXGRIP 5F (Vascular Products) ×1 IMPLANT
GUIDEWIRE ANGLED .035X150CM (WIRE) ×1 IMPLANT
KIT ANGIASSIST CO2 SYSTEM (KITS) ×1 IMPLANT
KIT ENCORE 26 ADVANTAGE (KITS) ×2 IMPLANT
KIT MICROPUNCTURE NIT STIFF (SHEATH) ×1 IMPLANT
KIT PV (KITS) ×3 IMPLANT
SHEATH DESTINATION MP 5FR 45CM (SHEATH) ×1 IMPLANT
SHEATH PINNACLE 5F 10CM (SHEATH) ×2 IMPLANT
SHEATH PROBE COVER 6X72 (BAG) ×1 IMPLANT
TRANSDUCER W/STOPCOCK (MISCELLANEOUS) ×3 IMPLANT
TRAY PV CATH (CUSTOM PROCEDURE TRAY) ×3 IMPLANT
WIRE BENTSON .035X145CM (WIRE) ×1 IMPLANT
WIRE G V18X300CM (WIRE) ×1 IMPLANT
WIRE ROSEN-J .035X180CM (WIRE) ×1 IMPLANT
WIRE SPARTACORE .014X300CM (WIRE) ×1 IMPLANT

## 2021-11-08 NOTE — Discharge Instructions (Signed)
Balloon angioplasty was used to open up blockage in the anterior tibial artery in the left leg below the knee.  He should have more than adequate blood flow for wound healing and is optimized.  New prescription for Plavix 75 mg daily and should also continue aspirin and statin.  I will arrange follow-up with me in 1 month in the office on [employer]. ?

## 2021-11-08 NOTE — Op Note (Signed)
? ? ?Patient name: Oscar Rose MRN: [id number] DOB: September 29, 1960 Sex: male ? ?11/08/2021 ?Pre-operative Diagnosis: Critical limb ischemia of the left lower extremity with nonhealing left transmetatarsal amputation ?Post-operative diagnosis:  Same ?Surgeon:  Marty Heck, MD ?Procedure Performed: ?1.  Ultrasound-guided access right common femoral artery ?2.  CO2 aortogram with catheter selection of aorta ?3.  Left lower extremity arteriogram with selection of third order branches ?4.  Left anterior tibial artery angioplasty (3 mm x 220 mm coyote) ?5.  Mynx closure of the right common femoral artery ?6.  63 minutes of monitored moderate conscious sedation time ? ?Indications: 61 year old male that underwent a left TMA at [facility] that has been nonhealing.  He was seen by my partner Dr. Donnetta Hutching in [place] and found to have likely significant tibial disease with a palpable popliteal pulse and severely depressed ABIs.  He presents today for aortogram, left lower extremity arteriogram, and possible intervention after risk benefits discussed.  WIFI (wound 3, ischemia N/a no ABI, infection 2) ? ?Findings:  ? ?Aortogram was performed with CO2 due to CKD.  Renal arteries were poorly visualized given CO2.  There appeared to be some disease in the abdominal aorta at about the level of the IMA approximately 50% and evaluation was limited without contrast.  This did not appear flow-limiting given he has easily palpable femoral pulses bilaterally.  Left lower extremity showed a widely patent common femoral, profunda, SFA.  There was some ulcerated plaque in the above-knee popliteal artery that appeared to be approximate 50% but not flow-limiting.  He has significant tibial disease and the dominant runoff is in the anterior tibial artery that has a 200 mm length high-grade stenosis that is calcified and diffusely diseased greater than 80% throughout the proximal, mid, to distal segment.  Proximal TP trunk is heavily  diseased and near occluded.  The proximal peroneal is occluded and then he reconstitutes a more robust peroneal.  PT is occluded for long segment from the origin.   ? ?Ultimately from right transfemoral access, I was able to get down the left anterior tibial artery into the foot.  The entire anterior tibial was then ballooned with a 3 mm x 220 mm coyote to nominal pressure for 3 minutes.  Excellent results.  No residual stenosis.  Patient now has inline flow into the foot. ?  ?Procedure:  The patient was identified in the holding area and taken to room 8.  The patient was then placed supine on the table and prepped and draped in the usual sterile fashion.  A time out was called.  Ultrasound was used to evaluate the right common femoral artery.  It was patent .  A digital ultrasound image was acquired.  A micropuncture needle was used to access the right common femoral artery under ultrasound guidance.  An 018 wire was advanced without resistance and a micropuncture sheath was placed.  The 018 wire was removed and a benson wire was placed.  The micropuncture sheath was exchanged for a 5 french sheath.  An omniflush catheter was advanced over the wire to the level of L-1.  An abdominal angiogram was obtained with CO2.  Next, using the omniflush catheter and a benson wire, the aortic bifurcation was crossed and the catheter was placed into the left external iliac artery and left runoff was obtained using CO2 and limited contrast distally with a straight flush catheter.  After evaluating images, he had significant tibial disease as his most significant problem for wound  healing.  A long Rosen wire was then placed down the left SFA and exchanged for a long 5 [place] Ansell sheath in the right groin over the aortic bifurcation.  Patient was given 100 units/kg IV heparin.  I then used a long straight flush catheter to get my wire into the below-knee popliteal artery and then a quick cross select catheter to select the  anterior tibial with a V18 wire and ultimately my wire was used to cross all the high-grade stenosis in the anterior tibial artery antegrade.  Ultimately the entire anterior tibial was then treated with a 3 mm x 220 mm coyote to nominal pressure for 2 minutes.  I used a hand-injection of diluted contrast through a long straight flush catheter with Toomey syringe that showed no significant residual stenosis.  I feel his inflow is now optimized form a vascular surgery standpoint.  Wires and catheters were removed.  A 5 French mynx was deployed in the right common femoral artery.  Taken to holding in stable condition. ? ?Plan: Patient will need aspirin Plavix statin.  He is now optimized for wound healing with inline flow to left TMA. I will see him in 1 month with non-invasive imaging. ? ? ?Marty Heck, MD ?[facility] ?Office: [phone] ? ? ?

## 2021-11-08 NOTE — H&P (Signed)
History and Physical Interval Note: ? ?11/08/2021 ?8:11 AM ? ?Oscar Rose  has presented today for surgery, with the diagnosis of ichemia - left lower extremity & left foot infection.  The various methods of treatment have been discussed with the patient and family. After consideration of risks, benefits and other options for treatment, the patient has consented to  Procedure(s): ?ABDOMINAL AORTOGRAM W/LOWER EXTREMITY (N/A) as a surgical intervention.  The patient's history has been reviewed, patient examined, no change in status, stable for surgery.  I have reviewed the patient's chart and labs.  Questions were answered to the patient's satisfaction.   ? ? ?Marty Heck ? ?[facility] [place] ?  ?Patient name: Oscar Rose MRN: [id number]        DOB: 10/06/1960            Sex: male ?  ?REASON FOR CONSULT: Evaluation nonhealing left transmetatarsal amputation ?  ?HPI: ?Oscar Rose is a 61 y.o. male, who is here today for evaluation of poorly healing left transmetatarsal amputation.  He has been followed at the [facility] wound center.  He presented with gangrenous changes subsequently underwent transmetatarsal amputation.  He had presented to the emergency department at [facility] on 09/25/2021.  He was found to have renal insufficiency with creatinine of 3.67 on admission which was up from 1.89 at his baseline on 09/05/2021.  He has an open transmetatarsal amputation with very slow healing.  Also has a sinus track in the more proximal arch of his foot.  This is being packed as well.  He has peripheral neuropathy.  He has minimal feeling in his feet bilaterally.  He does have care currently at [place] facility. ?  ?He is known to our practice from a prior carotid endarterectomy by Dr. Scot Dock for symptomatic disease in 2017 ?  ?    ?Past Medical History:  ?Diagnosis Date  ? Arthritis    ? At risk for sleep apnea    ?  STOP-BANG= 7    SENT TO PCP 06-29-2014  ? CKD  (chronic kidney disease), stage II    ?  montitored by nephrologist  ? Depression    ?     ? Diabetic neuropathy ([place])    ? GERD (gastroesophageal reflux disease)    ? Headache    ?  SINUS  ? History of kidney stones    ? History of retinal detachment    ? Hyperlipidemia    ? Hypertension    ? Legal blindness of left eye, as defined in [place]    ?  SECONDARY TO RETAINAL DETACHMENT  ? Neuropathy    ? Restless leg syndrome    ? Retained ureteral stent    ?  w/ encrustation SINCE 2012  ? Rotator cuff syndrome of right shoulder    ? Sleep apnea in adult    ? Stroke [facility])    ?  ?  STROKE  JULY  2017  ? Type 2 diabetes mellitus ([place])    ?  ?  ?     ?Family History  ?Problem Relation Age of Onset  ? Cancer Mother    ?      Throat  ? Stroke Neg Hx    ?  ?  ?SOCIAL HISTORY: ?Social History  ?  ?     ?Socioeconomic History  ? Marital status: Single  ?    Spouse name: Not on file  ? Number of  children: 0  ? Years of education: Not on file  ? Highest education level: Not on file  ?Occupational History  ? Not on file  ?Tobacco Use  ? Smoking status: Never  ? Smokeless tobacco: Never  ?Vaping Use  ? Vaping Use: Never used  ?Substance and Sexual Activity  ? Alcohol use: Yes  ?    Comment: Occassionally  ? Drug use: No  ? Sexual activity: Not on file  ?Other Topics Concern  ? Not on file  ?Social History Narrative  ?  Lives with "wife"  ?  ?Social Determinants of Health  ?  ?Financial Resource Strain: Not on file  ?Food Insecurity: Not on file  ?Transportation Needs: Not on file  ?Physical Activity: Not on file  ?Stress: Not on file  ?Social Connections: Not on file  ?Intimate Partner Violence: Not on file  ?  ?  ?No Known Allergies ?  ?      ?Current Outpatient Medications  ?Medication Sig Dispense Refill  ? acetaminophen (TYLENOL) 500 MG tablet Take 1,000 mg by mouth every 6 (six) hours as needed for mild pain or moderate pain.      ? Amino Acids-Protein Hydrolys (PRO-STAT PO) Take by mouth. 30 ML BID      ? amLODipine (NORVASC)  10 MG tablet Take 10 mg by mouth daily.      ? aspirin EC 81 MG tablet Take 81 mg by mouth daily.      ? atorvastatin (LIPITOR) 40 MG tablet Take 40 mg by mouth daily.      ? CIPROFLOXACIN IV Inject 200 mLs into the vein. Q 12 hours until 11/09/2021      ? collagenase (SANTYL) 250 UNIT/GM ointment Apply 1 application. topically daily. Pply to lt plantar foot topically q day      ? heparin 100 units/mL SOLN Inject into the vein once. 3 ML BU IV BID      ? insulin glargine (LANTUS) 100 UNIT/ML injection Inject 40 Units into the skin at bedtime.      ? meclizine (ANTIVERT) 25 MG tablet Take 25 mg by mouth 3 (three) times daily as needed for dizziness.      ? meropenem (MERREM) IVPB Inject into the vein. 500 MG/50 ML Q 8 HOURS PRN      ? Multiple Vitamin (MULTIVITAMIN) tablet Take 1 tablet by mouth daily.      ? oxycodone (OXY-IR) 5 MG capsule Take 5 mg by mouth every 8 (eight) hours as needed.      ? Sodium Chloride Flush (SALINE FLUSH IV) Inject into the vein. 0.9 % USE 10 ML BY IV Q 12 HOURS      ? sodium hypochlorite (DAKIN'S 1/2 STRENGTH) external solution Irrigate with 1 application. as directed once.      ? vitamin C (ASCORBIC ACID) 500 MG tablet Take 500 mg by mouth 2 (two) times daily.      ? zinc gluconate 50 MG tablet Take 50 mg by mouth daily.      ? zinc sulfate 220 (50 Zn) MG capsule Take 220 mg by mouth daily.      ? acetaZOLAMIDE (DIAMOX) 125 MG tablet Take 1 tablet by mouth daily.      ? Choline Fenofibrate 135 MG capsule Take 135 mg by mouth every evening.       ? Empagliflozin-Metformin HCl ER 12-998 MG TB24 Take 1 tablet by mouth 2 (two) times daily after a meal.      ?  glipiZIDE (GLUCOTROL XL) 10 MG 24 hr tablet Take 10 mg by mouth 2 (two) times daily.       ? losartan (COZAAR) 100 MG tablet Take 100 mg by mouth every evening.       ? omeprazole (PRILOSEC) 40 MG capsule Take 40 mg by mouth daily.      ? ondansetron (ZOFRAN) 4 MG tablet Take 1 tablet by mouth daily as needed.      ?  ?No current  facility-administered medications for this visit.  ?  ?  ?REVIEW OF SYSTEMS:  ?[X]  denotes positive finding, [ ]  denotes negative finding ?Cardiac   Comments:  ?Chest pain or chest pressure:      ?Shortness of breath upon exertion:      ?Short of breath when lying flat:      ?Irregular heart rhythm:      ?       ?Vascular      ?Pain in calf, thigh, or hip brought on by ambulation:      ?Pain in feet at night that wakes you up from your sleep:       ?Blood clot in your veins:      ?Leg swelling:       ?       ?Pulmonary      ?Oxygen at home:      ?Productive cough:       ?Wheezing:       ?       ?Neurologic      ?Sudden weakness in arms or legs:       ?Sudden numbness in arms or legs:       ?Sudden onset of difficulty speaking or slurred speech:      ?Temporary loss of vision in one eye:       ?Problems with dizziness:       ?       ?Gastrointestinal      ?Blood in stool:       ?Vomited blood:       ?       ?Genitourinary      ?Burning when urinating:       ?Blood in urine:      ?       ?Psychiatric      ?Major depression:       ?       ?Hematologic      ?Bleeding problems:      ?Problems with blood clotting too easily:      ?       ?Skin      ?Rashes or ulcers:      ?       ?Constitutional      ?Fever or chills:      ?  ?  ?PHYSICAL EXAM: ?   ?Vitals:  ?  10/17/21 0920  ?BP: (!) 147/84  ?Pulse: 84  ?Weight: 267 lb (121.1 kg)  ?Height: 5' 10.5" (1.791 m)  ?  ?  ?GENERAL: The patient is a well-nourished male, in no acute distress. The vital signs are documented above. ?CARDIOVASCULAR: 2+ radial pulses and 2+ popliteal pulses bilaterally.  I do not palpate pedal pulses bilaterally. ?PULMONARY: There is good air exchange  ?MUSCULOSKELETAL: There are no major deformities or cyanosis. ?NEUROLOGIC: No focal weakness or paresthesias are detected. ?SKIN: Open transmetatarsal amputation on the left.  Very poor granulation tissue on the superior flap and fat necrosis on the inferior flap.  There is also sinus  tract in the arch  of the foot.  I do not appreciate any fluctuance. ?PSYCHIATRIC: The patient has a normal affect. ?  ?DATA:  ?Noninvasive studies from [facility] in February revealed monophasic flow bilaterally with an

## 2021-11-08 NOTE — Progress Notes (Signed)
Discharge instructions was called to [facility] nurse. Nurse verbalized understanding. ?

## 2021-12-05 ENCOUNTER — Other Ambulatory Visit: Payer: Self-pay | Admitting: *Deleted

## 2021-12-05 DIAGNOSIS — I739 Peripheral vascular disease, unspecified: Secondary | ICD-10-CM

## 2021-12-05 DIAGNOSIS — I70222 Atherosclerosis of native arteries of extremities with rest pain, left leg: Secondary | ICD-10-CM

## 2021-12-12 ENCOUNTER — Encounter: Payer: Medicare Other | Admitting: Vascular Surgery

## 2021-12-19 ENCOUNTER — Encounter: Payer: Self-pay | Admitting: Vascular Surgery

## 2021-12-19 ENCOUNTER — Ambulatory Visit (INDEPENDENT_AMBULATORY_CARE_PROVIDER_SITE_OTHER): Payer: Medicare Other

## 2021-12-19 ENCOUNTER — Ambulatory Visit (INDEPENDENT_AMBULATORY_CARE_PROVIDER_SITE_OTHER): Payer: Medicare Other | Admitting: Vascular Surgery

## 2021-12-19 VITALS — BP 172/90 | HR 81 | Temp 98.4°F | Ht 70.25 in | Wt 258.6 lb

## 2021-12-19 DIAGNOSIS — I739 Peripheral vascular disease, unspecified: Secondary | ICD-10-CM

## 2021-12-19 DIAGNOSIS — I70222 Atherosclerosis of native arteries of extremities with rest pain, left leg: Secondary | ICD-10-CM | POA: Diagnosis not present

## 2021-12-19 NOTE — Progress Notes (Signed)
? ? [facility] [place] ? ?Patient name: Oscar Rose MRN: [id number] DOB: 10-15-1960 Sex: male ? ?REASON FOR VISIT: Follow-up recent right anterior tibial angioplasty with Dr. Carlis Abbott ? ?HPI: ?Oscar Rose is a 61 y.o. male here today for follow-up.  He had presented initially with poorly healing left transmetatarsal amputation which initially was done in February 2023.  He had evidence of lower extremity arterial insufficiency and underwent arteriography with Dr. Carlis Abbott on 11/08/2021 at [facility].  This showed severe three-vessel tibial disease.  He underwent successful crossing of anterior tibial occlusion and angioplasty.  He is here today for follow-up.  He continues to be seen at the [facility] wound center with improvement ? ?Past Medical History:  ?Diagnosis Date  ? Arthritis   ? At risk for sleep apnea   ? STOP-BANG= 7    SENT TO PCP 06-29-2014  ? CKD (chronic kidney disease), stage II   ? montitored by nephrologist  ? Depression   ?    ? Diabetic neuropathy ([place])   ? GERD (gastroesophageal reflux disease)   ? Headache   ? SINUS  ? History of kidney stones   ? History of retinal detachment   ? Hyperlipidemia   ? Hypertension   ? Legal blindness of left eye, as defined in [place]   ? SECONDARY TO RETAINAL DETACHMENT  ? Neuropathy   ? Restless leg syndrome   ? Retained ureteral stent   ? w/ encrustation SINCE 2012  ? Rotator cuff syndrome of right shoulder   ? Sleep apnea in adult   ? Stroke [facility])   ? ?  STROKE  JULY  2017  ? Type 2 diabetes mellitus ([place])   ? ? ?Family History  ?Problem Relation Age of Onset  ? Cancer Mother   ?     Throat  ? Stroke Neg Hx   ? ? ?SOCIAL HISTORY: ?Social History  ? ?Tobacco Use  ? Smoking status: Never  ? Smokeless tobacco: Never  ?Substance Use Topics  ? Alcohol use: Yes  ?  Comment: Occassionally  ? ? ?No Known Allergies ? ?Current Outpatient Medications  ?Medication Sig Dispense Refill  ? Amino  Acids-Protein Hydrolys (PRO-STAT PO) Take 30 mLs by mouth in the morning and at bedtime.    ? amLODipine (NORVASC) 10 MG tablet Take 10 mg by mouth daily.    ? aspirin EC 81 MG tablet Take 81 mg by mouth daily.    ? atorvastatin (LIPITOR) 40 MG tablet Take 40 mg by mouth daily.    ? Cholecalciferol 1.25 MG (50000 UT) TABS Take 1.25 mg by mouth once a week. Mondays    ? CIPROFLOXACIN IV Inject 200 mLs into the vein. Q 12 hours until 11/09/2021    ? clopidogrel (PLAVIX) 75 MG tablet Take 1 tablet (75 mg total) by mouth daily. 30 tablet 11  ? ferrous sulfate 325 (65 FE) MG tablet Take 325 mg by mouth daily with breakfast.    ? hydrALAZINE (APRESOLINE) 25 MG tablet Take 25 mg by mouth 3 (three) times daily.    ? insulin glargine (LANTUS) 100 UNIT/ML injection Inject 40 Units into the skin at bedtime.    ? meclizine (ANTIVERT) 25 MG tablet Take 25 mg by mouth every 8 (eight) hours as needed for dizziness.    ? Multiple Vitamin (MULTIVITAMIN) tablet Take 1 tablet by mouth daily.    ? Sodium Chloride Flush (SALINE FLUSH IV) Inject into the vein.  0.9 % USE 10 ML BY IV Q 12 HOURS    ? vitamin B-12 (CYANOCOBALAMIN) 500 MCG tablet Take 500 mcg by mouth daily.    ? vitamin C (ASCORBIC ACID) 500 MG tablet Take 1,000 mg by mouth 2 (two) times daily.    ? zinc sulfate 220 (50 Zn) MG capsule Take 220 mg by mouth daily.    ? Heparin Sod, Pork, Lock Flush (HEPARIN, PORCINE, LOCK FLUSH IV) Inject 10 Units/mL into the vein See admin instructions. Use 29ml intravenously qid for IV patency flush using sash method (Patient not taking: Reported on 12/19/2021)    ? TIGECYCLINE IV Inject 50 mg into the vein in the morning and at bedtime. (Patient not taking: Reported on 12/19/2021)    ? ?No current facility-administered medications for this visit.  ? ? ?REVIEW OF SYSTEMS:  ?[X]  denotes positive finding, [ ]  denotes negative finding ?Cardiac  Comments:  ?Chest pain or chest pressure:    ?Shortness of breath upon exertion:    ?Short of breath  when lying flat:    ?Irregular heart rhythm:    ?    ?Vascular    ?Pain in calf, thigh, or hip brought on by ambulation:    ?Pain in feet at night that wakes you up from your sleep:     ?Blood clot in your veins:    ?Leg swelling:     ?    ? ? ?PHYSICAL EXAM: ?Vitals:  ? 12/19/21 1044  ?BP: (!) 172/90  ?Pulse: 81  ?Temp: 98.4 ?F (36.9 ?C)  ?TempSrc: Temporal  ?SpO2: 97%  ?Weight: 258 lb 9.6 oz (117.3 kg)  ?Height: 5' 10.25" (1.784 m)  ? ? ?GENERAL: The patient is a well-nourished male, in no acute distress. The vital signs are documented above. ?CARDIOVASCULAR: 2+ left dorsalis pedis pulse ?PULMONARY: There is good air exchange  ?MUSCULOSKELETAL: There are no major deformities or cyanosis. ?NEUROLOGIC: No focal weakness or paresthesias are detected. ?SKIN: The medial aspect of his transmetatarsal is completely healed.  The lateral two thirds are open with excellent granulating tissue ?PSYCHIATRIC: The patient has a normal affect. ? ?DATA:  ?Noninvasive studies today reveal normal ankle arm index on the left and ankle arm index of 0.6 on the right. ? ?Imaging of his anterior tibial artery reveal wide patency with some moderate increase in his flow velocity mid anterior tibial artery ? ?MEDICAL ISSUES: ?Excellent initial results of anterior tibial angioplasty with normal dorsalis pedis pulse and ankle arm index and good healing of his transmetatarsal amputation.  He will continue follow-up with the [facility] wound center and I will see him again in 6 months with repeat noninvasive studies ? ? ? ?Rosetta Posner, MD FACS ?[facility] [place] ?Office Tel [phone] ? ?Note: Portions of this report may have been transcribed using voice recognition software.  Every effort has been made to ensure accuracy; however, inadvertent computerized transcription errors may still be present. ?

## 2021-12-25 ENCOUNTER — Other Ambulatory Visit: Payer: Self-pay | Admitting: *Deleted

## 2021-12-25 DIAGNOSIS — I739 Peripheral vascular disease, unspecified: Secondary | ICD-10-CM

## 2021-12-25 DIAGNOSIS — I70222 Atherosclerosis of native arteries of extremities with rest pain, left leg: Secondary | ICD-10-CM

## 2022-01-08 ENCOUNTER — Telehealth: Payer: Self-pay | Admitting: Nurse Practitioner

## 2022-01-08 NOTE — Telephone Encounter (Signed)
Closed referral. Faxed PCP

## 2022-05-12 ENCOUNTER — Encounter (HOSPITAL_COMMUNITY): Payer: Self-pay

## 2022-05-12 ENCOUNTER — Emergency Department (HOSPITAL_COMMUNITY): Payer: Medicare Other

## 2022-05-12 ENCOUNTER — Inpatient Hospital Stay (HOSPITAL_COMMUNITY)
Admission: EM | Admit: 2022-05-12 | Discharge: 2022-05-18 | DRG: 871 | Disposition: A | Discharge status: Skilled Nursing Facility | Payer: Medicare Other | Attending: Family Medicine | Admitting: Family Medicine

## 2022-05-12 ENCOUNTER — Encounter (HOSPITAL_COMMUNITY): Payer: Self-pay | Admitting: *Deleted

## 2022-05-12 ENCOUNTER — Other Ambulatory Visit: Payer: Self-pay

## 2022-05-12 DIAGNOSIS — L03116 Cellulitis of left lower limb: Secondary | ICD-10-CM

## 2022-05-12 DIAGNOSIS — E871 Hypo-osmolality and hyponatremia: Secondary | ICD-10-CM | POA: Diagnosis present

## 2022-05-12 DIAGNOSIS — E559 Vitamin D deficiency, unspecified: Secondary | ICD-10-CM | POA: Diagnosis present

## 2022-05-12 DIAGNOSIS — J9601 Acute respiratory failure with hypoxia: Secondary | ICD-10-CM | POA: Diagnosis present

## 2022-05-12 DIAGNOSIS — M1990 Unspecified osteoarthritis, unspecified site: Secondary | ICD-10-CM | POA: Diagnosis present

## 2022-05-12 DIAGNOSIS — Z7902 Long term (current) use of antithrombotics/antiplatelets: Secondary | ICD-10-CM

## 2022-05-12 DIAGNOSIS — Z794 Long term (current) use of insulin: Secondary | ICD-10-CM

## 2022-05-12 DIAGNOSIS — A419 Sepsis, unspecified organism: Secondary | ICD-10-CM | POA: Diagnosis present

## 2022-05-12 DIAGNOSIS — E860 Dehydration: Secondary | ICD-10-CM | POA: Diagnosis present

## 2022-05-12 DIAGNOSIS — R339 Retention of urine, unspecified: Secondary | ICD-10-CM

## 2022-05-12 DIAGNOSIS — H548 Legal blindness, as defined in USA: Secondary | ICD-10-CM | POA: Diagnosis present

## 2022-05-12 DIAGNOSIS — I130 Hypertensive heart and chronic kidney disease with heart failure and stage 1 through stage 4 chronic kidney disease, or unspecified chronic kidney disease: Secondary | ICD-10-CM | POA: Diagnosis present

## 2022-05-12 DIAGNOSIS — I959 Hypotension, unspecified: Secondary | ICD-10-CM | POA: Diagnosis present

## 2022-05-12 DIAGNOSIS — K219 Gastro-esophageal reflux disease without esophagitis: Secondary | ICD-10-CM | POA: Diagnosis present

## 2022-05-12 DIAGNOSIS — E1122 Type 2 diabetes mellitus with diabetic chronic kidney disease: Secondary | ICD-10-CM | POA: Diagnosis present

## 2022-05-12 DIAGNOSIS — E1165 Type 2 diabetes mellitus with hyperglycemia: Secondary | ICD-10-CM | POA: Diagnosis present

## 2022-05-12 DIAGNOSIS — J189 Pneumonia, unspecified organism: Secondary | ICD-10-CM | POA: Diagnosis present

## 2022-05-12 DIAGNOSIS — E1151 Type 2 diabetes mellitus with diabetic peripheral angiopathy without gangrene: Secondary | ICD-10-CM | POA: Diagnosis present

## 2022-05-12 DIAGNOSIS — Z8673 Personal history of transient ischemic attack (TIA), and cerebral infarction without residual deficits: Secondary | ICD-10-CM

## 2022-05-12 DIAGNOSIS — D631 Anemia in chronic kidney disease: Secondary | ICD-10-CM | POA: Diagnosis present

## 2022-05-12 DIAGNOSIS — E1140 Type 2 diabetes mellitus with diabetic neuropathy, unspecified: Secondary | ICD-10-CM | POA: Diagnosis present

## 2022-05-12 DIAGNOSIS — I5022 Chronic systolic (congestive) heart failure: Secondary | ICD-10-CM | POA: Diagnosis present

## 2022-05-12 DIAGNOSIS — G2581 Restless legs syndrome: Secondary | ICD-10-CM | POA: Diagnosis present

## 2022-05-12 DIAGNOSIS — N184 Chronic kidney disease, stage 4 (severe): Secondary | ICD-10-CM | POA: Diagnosis present

## 2022-05-12 DIAGNOSIS — E669 Obesity, unspecified: Secondary | ICD-10-CM | POA: Diagnosis present

## 2022-05-12 DIAGNOSIS — R6520 Severe sepsis without septic shock: Secondary | ICD-10-CM | POA: Diagnosis present

## 2022-05-12 DIAGNOSIS — I509 Heart failure, unspecified: Secondary | ICD-10-CM | POA: Diagnosis not present

## 2022-05-12 DIAGNOSIS — E785 Hyperlipidemia, unspecified: Secondary | ICD-10-CM | POA: Diagnosis present

## 2022-05-12 DIAGNOSIS — Z79899 Other long term (current) drug therapy: Secondary | ICD-10-CM

## 2022-05-12 DIAGNOSIS — G4730 Sleep apnea, unspecified: Secondary | ICD-10-CM | POA: Diagnosis present

## 2022-05-12 DIAGNOSIS — Z1152 Encounter for screening for COVID-19: Secondary | ICD-10-CM

## 2022-05-12 DIAGNOSIS — N179 Acute kidney failure, unspecified: Secondary | ICD-10-CM | POA: Diagnosis present

## 2022-05-12 DIAGNOSIS — E119 Type 2 diabetes mellitus without complications: Secondary | ICD-10-CM

## 2022-05-12 DIAGNOSIS — R0602 Shortness of breath: Secondary | ICD-10-CM | POA: Diagnosis present

## 2022-05-12 DIAGNOSIS — Z801 Family history of malignant neoplasm of trachea, bronchus and lung: Secondary | ICD-10-CM

## 2022-05-12 DIAGNOSIS — N183 Chronic kidney disease, stage 3 unspecified: Secondary | ICD-10-CM | POA: Diagnosis present

## 2022-05-12 DIAGNOSIS — J069 Acute upper respiratory infection, unspecified: Secondary | ICD-10-CM

## 2022-05-12 DIAGNOSIS — Z7982 Long term (current) use of aspirin: Secondary | ICD-10-CM

## 2022-05-12 HISTORY — DX: Peripheral vascular disease, unspecified: I73.9

## 2022-05-12 HISTORY — DX: Vitamin D deficiency, unspecified: E55.9

## 2022-05-12 HISTORY — DX: Anemia, unspecified: D64.9

## 2022-05-12 LAB — CBC WITH DIFFERENTIAL/PLATELET
Abs Immature Granulocytes: 0.09 10*3/uL — ABNORMAL HIGH (ref 0.00–0.07)
Basophils Absolute: 0 10*3/uL (ref 0.0–0.1)
Basophils Relative: 0 %
Eosinophils Absolute: 0 10*3/uL (ref 0.0–0.5)
Eosinophils Relative: 0 %
HCT: 29 % — ABNORMAL LOW (ref 39.0–52.0)
Hemoglobin: 10 g/dL — ABNORMAL LOW (ref 13.0–17.0)
Immature Granulocytes: 1 %
Lymphocytes Relative: 2 %
Lymphs Abs: 0.3 10*3/uL — ABNORMAL LOW (ref 0.7–4.0)
MCH: 30.2 pg (ref 26.0–34.0)
MCHC: 34.5 g/dL (ref 30.0–36.0)
MCV: 87.6 fL (ref 80.0–100.0)
Monocytes Absolute: 0.5 10*3/uL (ref 0.1–1.0)
Monocytes Relative: 4 %
Neutro Abs: 14.7 10*3/uL — ABNORMAL HIGH (ref 1.7–7.7)
Neutrophils Relative %: 93 %
Platelets: 217 10*3/uL (ref 150–400)
RBC: 3.31 MIL/uL — ABNORMAL LOW (ref 4.22–5.81)
RDW: 12.3 % (ref 11.5–15.5)
WBC: 15.6 10*3/uL — ABNORMAL HIGH (ref 4.0–10.5)
nRBC: 0 % (ref 0.0–0.2)

## 2022-05-12 LAB — URINALYSIS, ROUTINE W REFLEX MICROSCOPIC
Bacteria, UA: NONE SEEN
Bilirubin Urine: NEGATIVE
Glucose, UA: NEGATIVE mg/dL
Hgb urine dipstick: NEGATIVE
Ketones, ur: NEGATIVE mg/dL
Leukocytes,Ua: NEGATIVE
Nitrite: NEGATIVE
Protein, ur: >=300 mg/dL — AB
Specific Gravity, Urine: 1.014 (ref 1.005–1.030)
pH: 5 (ref 5.0–8.0)

## 2022-05-12 LAB — COMPREHENSIVE METABOLIC PANEL
ALT: 19 U/L (ref 0–44)
AST: 40 U/L (ref 15–41)
Albumin: 3.1 g/dL — ABNORMAL LOW (ref 3.5–5.0)
Alkaline Phosphatase: 65 U/L (ref 38–126)
Anion gap: 13 (ref 5–15)
BUN: 49 mg/dL — ABNORMAL HIGH (ref 6–20)
CO2: 21 mmol/L — ABNORMAL LOW (ref 22–32)
Calcium: 7.9 mg/dL — ABNORMAL LOW (ref 8.9–10.3)
Chloride: 93 mmol/L — ABNORMAL LOW (ref 98–111)
Creatinine, Ser: 3.61 mg/dL — ABNORMAL HIGH (ref 0.61–1.24)
GFR, Estimated: 19 mL/min — ABNORMAL LOW (ref >60)
Glucose, Bld: 230 mg/dL — ABNORMAL HIGH (ref 70–99)
Potassium: 4.3 mmol/L (ref 3.5–5.1)
Sodium: 127 mmol/L — ABNORMAL LOW (ref 135–145)
Total Bilirubin: 1.4 mg/dL — ABNORMAL HIGH (ref 0.3–1.2)
Total Protein: 6.7 g/dL (ref 6.5–8.1)

## 2022-05-12 LAB — LACTIC ACID, PLASMA
Lactic Acid, Venous: 1.9 mmol/L (ref 0.5–1.9)
Lactic Acid, Venous: 1.4 mmol/L (ref 0.5–1.9)

## 2022-05-12 LAB — BLOOD GAS, VENOUS
Acid-base deficit: 1.3 mmol/L (ref 0.0–2.0)
Bicarbonate: 22.7 mmol/L (ref 20.0–28.0)
Drawn by: 7012
O2 Saturation: 79.2 %
Patient temperature: 38.8
pCO2, Ven: 38 mmHg — ABNORMAL LOW (ref 44–60)
pH, Ven: 7.39 (ref 7.25–7.43)
pO2, Ven: 54 mmHg — ABNORMAL HIGH (ref 32–45)

## 2022-05-12 LAB — BLOOD GAS, ARTERIAL
Acid-base deficit: 1.1 mmol/L (ref 0.0–2.0)
Bicarbonate: 22.6 mmol/L (ref 20.0–28.0)
Drawn by: 27016
FIO2: 100 %
O2 Saturation: 84.6 %
Patient temperature: 38.3
pCO2 arterial: 36 mmHg (ref 32–48)
pH, Arterial: 7.41 (ref 7.35–7.45)
pO2, Arterial: 56 mmHg — ABNORMAL LOW (ref 83–108)

## 2022-05-12 LAB — PROTIME-INR
INR: 1.2 (ref 0.8–1.2)
Prothrombin Time: 15 seconds (ref 11.4–15.2)

## 2022-05-12 LAB — RESP PANEL BY RT-PCR (FLU A&B, COVID) ARPGX2
Influenza A by PCR: NEGATIVE
Influenza B by PCR: NEGATIVE
SARS Coronavirus 2 by RT PCR: NEGATIVE

## 2022-05-12 LAB — BRAIN NATRIURETIC PEPTIDE: B Natriuretic Peptide: 643 pg/mL — ABNORMAL HIGH (ref 0.0–100.0)

## 2022-05-12 LAB — APTT: aPTT: 29 seconds (ref 24–36)

## 2022-05-12 MED ORDER — IPRATROPIUM-ALBUTEROL 0.5-2.5 (3) MG/3ML IN SOLN
3.0000 mL | Freq: Once | RESPIRATORY_TRACT | Status: AC
  Administered 2022-05-12: 3 mL via RESPIRATORY_TRACT
  Filled 2022-05-12: qty 3

## 2022-05-12 MED ORDER — VANCOMYCIN HCL 1500 MG/300ML IV SOLN
1500.0000 mg | INTRAVENOUS | Status: DC
  Administered 2022-05-14: 1500 mg via INTRAVENOUS
  Filled 2022-05-12 (×2): qty 300

## 2022-05-12 MED ORDER — KETOROLAC TROMETHAMINE 15 MG/ML IJ SOLN
15.0000 mg | Freq: Once | INTRAMUSCULAR | Status: AC
  Administered 2022-05-12: 15 mg via INTRAVENOUS
  Filled 2022-05-12: qty 1

## 2022-05-12 MED ORDER — VANCOMYCIN HCL 2000 MG/400ML IV SOLN
2000.0000 mg | Freq: Once | INTRAVENOUS | Status: AC
  Administered 2022-05-12: 2000 mg via INTRAVENOUS
  Filled 2022-05-12: qty 400

## 2022-05-12 NOTE — ED Notes (Signed)
Critical Care paged at this time through [facility].Lattie Haw

## 2022-05-12 NOTE — ED Notes (Signed)
Pt unable to give urine sample since arriving. Pt bladder scanned and showed 370 mL. Pt states he does not feel like he has to urinate.

## 2022-05-12 NOTE — ED Notes (Signed)
RT with pt

## 2022-05-12 NOTE — ED Provider Notes (Addendum)
[facility] EMERGENCY DEPARTMENT Provider Note   CSN: [id number] Arrival date & time: 05/12/22  5631     History  Chief Complaint  Patient presents with   Shortness of Breath    Oscar Rose is a 61 y.o. male.  Patient is a 61 year old male with past medical history of diabetes, bilateral lower toe amputations, and*presenting from nursing home for hypoxia.  Patient was found this morning with a reported oxygen of 30% on room air.  Patient was placed on a nonrebreather and EMS found patient satting at 70% on their arrival.  Patient endorses nasal congestion, sinus pressure, and productive cough x3 days.  Patient denies any fevers or chills.  States he is not typically on home oxygen.  Of note on physical exam patient has erythema and warmth of left foot near her surgical incision site.  No purulent drainage.  Patient endorses toe amputation 7 months ago and states he had osteomyelitis at that time.  He is no longer on antibiotics.   The history is provided by the patient. No language interpreter was used.  Shortness of Breath Associated symptoms: cough   Associated symptoms: no abdominal pain, no chest pain, no ear pain, no fever, no rash, no sore throat and no vomiting        Home Medications Prior to Admission medications   Medication Sig Start Date End Date Taking? Authorizing Provider  aspirin EC 81 MG tablet Take 81 mg by mouth daily.   Yes [provider]  Cholecalciferol 100 MCG (4000 UT) TABS Take 1 tablet by mouth daily.   Yes [provider]  ferrous sulfate 325 (65 FE) MG tablet Take 325 mg by mouth daily with breakfast.   Yes [provider]  insulin glargine (LANTUS) 100 UNIT/ML injection Inject 40 Units into the skin at bedtime.   Yes [provider]  insulin lispro (HUMALOG) 100 UNIT/ML injection Inject into the skin. 02/12/22  Yes [provider]  Omega-3 1000 MG CAPS Take 1 capsule by mouth every morning.   Yes [provider]  vitamin B-12 (CYANOCOBALAMIN) 500 MCG tablet Take 500 mcg by mouth daily.   Yes [provider]  Amino Acids-Protein Hydrolys (PRO-STAT PO) Take 30 mLs by mouth in the morning and at bedtime.    [provider]  amLODipine (NORVASC) 10 MG tablet Take 10 mg by mouth daily.    [provider]  atorvastatin (LIPITOR) 40 MG tablet Take 40 mg by mouth daily.    [provider]  Cholecalciferol 1.25 MG (50000 UT) TABS Take 1.25 mg by mouth once a week. Mondays    [provider]  CIPROFLOXACIN IV Inject 200 mLs into the vein. Q 12 hours until 11/09/2021    [provider]  clopidogrel (PLAVIX) 75 MG tablet Take 1 tablet (75 mg total) by mouth daily. 11/08/21 11/08/22  Marty Heck, MD  Heparin Sod, Pork, Lock Flush (HEPARIN, PORCINE, LOCK FLUSH IV) Inject 10 Units/mL into the vein See admin instructions. Use 75ml intravenously qid for IV patency flush using sash method Patient not taking: Reported on 12/19/2021    [provider]  hydrALAZINE (APRESOLINE) 25 MG tablet Take 25 mg by mouth 3 (three) times daily.    [provider]  meclizine (ANTIVERT) 25 MG tablet Take 25 mg by mouth every 8 (eight) hours as needed for dizziness.    [provider]  Multiple Vitamin (MULTIVITAMIN) tablet Take 1 tablet by mouth daily.  [provider]  Sodium Chloride Flush (SALINE FLUSH IV) Inject into the vein. 0.9 % USE 10 ML BY IV Q 12 HOURS    [provider]  TIGECYCLINE IV Inject 50 mg into the vein in the morning and at bedtime. Patient not taking: Reported on 12/19/2021    [provider]  vitamin C (ASCORBIC ACID) 500 MG tablet Take 1,000 mg by mouth 2 (two) times daily.    [provider]  zinc sulfate 220 (50 Zn) MG capsule Take 220 mg by mouth daily.    [provider]      Allergies    Patient has no known allergies.    Review of Systems   Review of Systems   Constitutional:  Negative for chills and fever.  HENT:  Positive for congestion and sinus pressure. Negative for ear pain and sore throat.   Eyes:  Negative for pain and visual disturbance.  Respiratory:  Positive for cough and shortness of breath.   Cardiovascular:  Negative for chest pain and palpitations.  Gastrointestinal:  Negative for abdominal pain and vomiting.  Genitourinary:  Negative for dysuria and hematuria.  Musculoskeletal:  Negative for arthralgias and back pain.  Skin:  Negative for color change and rash.  Neurological:  Negative for seizures and syncope.  All other systems reviewed and are negative.   Physical Exam Updated Vital Signs BP 136/65   Pulse 100   Temp 99.6 F (37.6 C) (Oral)   Resp (!) 25   Ht 5' 10.5" (1.791 m)   Wt 115.2 kg   SpO2 93%   BMI 35.93 kg/m  Physical Exam Vitals and nursing note reviewed.  Constitutional:      General: He is not in acute distress.    Appearance: He is well-developed.  HENT:     Head: Normocephalic and atraumatic.  Eyes:     Conjunctiva/sclera: Conjunctivae normal.  Cardiovascular:     Rate and Rhythm: Regular rhythm. Tachycardia present.     Heart sounds: No murmur heard. Pulmonary:     Effort: Tachypnea and respiratory distress present.     Breath sounds: Rhonchi present.  Abdominal:     Palpations: Abdomen is soft.     Tenderness: There is no abdominal tenderness.  Musculoskeletal:        General: No swelling.     Cervical back: Neck supple.  Feet:     Comments: Amputations of multiple toes on bilateral lower feet.  Left foot demonstrates erythema, warmth, and swelling at the dorsum of the foot near the surgical incision site.  No purulent drainage present.  No wound dehiscence. Skin:    General: Skin is warm and dry.     Capillary Refill: Capillary refill takes less than 2 seconds.  Neurological:     Mental Status: He is alert.  Psychiatric:        Mood and Affect: Mood normal.     ED Results /  Procedures / Treatments   Labs (all labs ordered are listed, but only abnormal results are displayed) Labs Reviewed  COMPREHENSIVE METABOLIC PANEL - Abnormal; Notable for the following components:      Result Value   Sodium 127 (*)    Chloride 93 (*)    CO2 21 (*)    Glucose, Bld 230 (*)    BUN 49 (*)    Creatinine, Ser 3.61 (*)    Calcium 7.9 (*)    Albumin 3.1 (*)    Total Bilirubin 1.4 (*)  GFR, Estimated 19 (*)    All other components within normal limits  CBC WITH DIFFERENTIAL/PLATELET - Abnormal; Notable for the following components:   WBC 15.6 (*)    RBC 3.31 (*)    Hemoglobin 10.0 (*)    HCT 29.0 (*)    Neutro Abs 14.7 (*)    Lymphs Abs 0.3 (*)    Abs Immature Granulocytes 0.09 (*)    All other components within normal limits  BLOOD GAS, VENOUS - Abnormal; Notable for the following components:   pCO2, Ven 38 (*)    pO2, Ven 54 (*)    All other components within normal limits  CULTURE, BLOOD (ROUTINE X 2)  RESP PANEL BY RT-PCR (FLU A&B, COVID) ARPGX2  CULTURE, BLOOD (ROUTINE X 2)  URINE CULTURE  LACTIC ACID, PLASMA  LACTIC ACID, PLASMA  PROTIME-INR  APTT  URINALYSIS, ROUTINE W REFLEX MICROSCOPIC    EKG None  Radiology DG Foot 2 Views Left  Result Date: 05/12/2022 CLINICAL DATA:  Amputation for osteomyelitis. EXAM: LEFT FOOT - 2 VIEW COMPARISON:  09/25/2021. FINDINGS: Since the previous exam, there has been a wide forefoot amputation. The great toe is been resected as has the second toe and a small portion of the second metatarsal head. Distal aspects of the third and fourth metatarsals have been resected along with the third and fourth toes. Fifth metatarsal has been resected from the midshaft distally including the fifth toe. Resected margins are well-defined. No areas of bone resorption to suggest osteomyelitis. There is surrounding forefoot soft tissue swelling. No soft tissue air. IMPRESSION: 1. Forefoot amputations as detailed with surrounding soft tissue  swelling, but no evidence of osteomyelitis. Electronically Signed   By: Lajean Manes M.D.   On: 05/12/2022 10:09   DG Chest Port 1 View  Result Date: 05/12/2022 CLINICAL DATA:  Shortness of breath, cough, questionable sepsis EXAM: PORTABLE CHEST 1 VIEW COMPARISON:  Chest x-ray October 05, 2021 FINDINGS: Stable cardiomediastinal contours. Diffuse bilateral interstitial and heterogeneous pulmonary opacities. No large pleural effusion or pneumothorax. No acute osseous abnormality. The visualized upper abdomen is unremarkable. IMPRESSION: Diffuse interstitial and heterogeneous pulmonary opacities. Differential considerations include infectious or inflammatory etiology or pulmonary edema. Electronically Signed   By: Beryle Flock M.D.   On: 05/12/2022 09:48    Procedures .Critical Care  Performed by: Lianne Cure, DO Authorized by: Lianne Cure, DO   Critical care provider statement:    Critical care time (minutes):  100   Critical care was necessary to treat or prevent imminent or life-threatening deterioration of the following conditions:  Sepsis   Critical care was time spent personally by me on the following activities:  Development of treatment plan with patient or surrogate, discussions with consultants, evaluation of patient's response to treatment, examination of patient, ordering and review of laboratory studies, ordering and review of radiographic studies, ordering and performing treatments and interventions, pulse oximetry, re-evaluation of patient's condition and review of old charts   Care discussed with: admitting provider       Medications Ordered in ED Medications  vancomycin (VANCOREADY) IVPB 1500 mg/300 mL (has no administration in time range)  ketorolac (TORADOL) 15 MG/ML injection 15 mg (15 mg Intravenous Given 05/12/22 0945)  ipratropium-albuterol (DUONEB) 0.5-2.5 (3) MG/3ML nebulizer solution 3 mL (3 mLs Nebulization Given 05/12/22 1028)  vancomycin (VANCOREADY) IVPB 2000  mg/400 mL (0 mg Intravenous Stopped 05/12/22 1230)    ED Course/ Medical Decision Making/ A&P  Medical Decision Making Amount and/or Complexity of Data Reviewed Labs: ordered. Radiology: ordered. ECG/medicine tests: ordered.  Risk Prescription drug management. Decision regarding hospitalization.   1:41 PM Patient is a 61 year old male presenting from nursing home with past medical history of diabetes, osteomyelitis, etc. presenting for congestion, cough, fever, and hypoxia.  Patient is alert and oriented x3, acute respiratory distress, with tachycardia of 118, tachypnea 41 breaths/min, hypoxia of 84% on nonrebreather, and fever of 101.8.  Code sepsis activated at this time.  Blood cultures and lactic acid drawn.  Patient was given Rocephin in route by EMS.  Will order vancomycin.  Patient already received an IV fluid bolus for hypotension of 90 systolic in route by EMS.  Will order maintenance fluids.  Current blood pressure has improved to 130/44.  No septic shock at this time.  Toradol ordered for fever. Physical exam demonstrates rales in bilateral lung fields.  Patient remains hypoxic on nonrebreather despite resting in bed.  BiPAP with DuoNebs ordered.  Sickle exam also pertinent for cellulitis of the left lower extremity near previous surgical incision site.  No wound dehiscence or purulent drainage.  Erythema, swelling, warmth, and tenderness only.  Chart review demonstrates history of osteomyelitis.  Patient is not currently on antibiotics.  Vancomycin will cover.  X-rays and other work-ups pending.    Medical interventions: -Toradol 15 mg IV -IVF bolus 1 L in route and maintenance fluids in ED -Rocephin IV in route -Vancomycin IV -BiPAP -Duonebs  Imaging:  CXR pertinent for bilateral focal opacities concerning for pneumonia.  Patient already treated with vancomycin and Rocephin. Foot Xray demonstrates no osteomyelitis.  Patient already treated for  cellulitis.  Labs: Leukocytosis present.  Normal lactic acid.  Blood culture sent.   UA pending Covid/Influenza PCR negative  Patient excepted to [facility], ICU floor for sepsis secondary to pneumonia and left foot cellulitis, acute respiratory distress with hypoxia, on BiPAP.          Final Clinical Impression(s) / ED Diagnoses Final diagnoses:  Severe sepsis ([place])  Acute respiratory failure with hypoxia ([facility])  Viral URI with cough  Cellulitis of left lower extremity  Pneumonia due to infectious organism, unspecified laterality, unspecified part of lung  AKI (acute kidney injury) [facility])    Rx / DC Orders ED Discharge Orders     None         Lianne Cure, DO 36/14/43 1540    Lianne Cure, DO 08/67/61 1343

## 2022-05-12 NOTE — H&P (Signed)
TRH H&P    Patient Demographics:    Oscar Rose, is a 61 y.o. male  MRN: [id number]  DOB - 05/11/1961  Admit Date - 05/12/2022  Referring MD/NP/PA: Noemi Chapel  Outpatient Primary MD for the patient is Dione Housekeeper, MD  Patient coming from: [facility]  Chief complaint-shortness of breath   HPI:    Oscar Rose  is a 61 y.o. 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 He was hospitalized at [facility] with osteomyelitis of left foot admitted on 09/25/2021 and subsequently discharged to SNF on 10/05/2021. During that admission he had left-sided transmetatarsal amputation.  Patient is followed by [facility] wound care clinic.  Was recently seen at wound care clinic on 05/02/2022.  He was seen by [facility] cardiology on 05/07/2022 for possible CHF and was ruled out of CHF.  As per note from cardiology his echocardiogram showed preserved ejection fraction.  He was brought to the ED today as he was found this morning with oxygen saturation of 30% on room air, he was placed on nonrebreather and EMS was called.  Patient denies any fever or chills.  Complains of nasal congestion and sinus pressure.  Patient states that he is not typically on home oxygen.  Initially patient was placed on BiPAP, which has been weaned off.  He was given IV Rocephin.  At this time patient is resting comfortably.  He still requiring HFNC 25 L/min.  SARS-CoV-2 RT-PCR, influenza A and influenza B by PCR are negative  Chest x-ray in the ED showed diffuse interstitial and heterogeneous  pulmonary opacities with differential including infectious versus inflammatory versus pulmonary edema.  X-ray of left foot showed forefoot amputations with surrounding soft tissue swelling but no evidence of osteomyelitis.  Patient was started on vancomycin in the ED. lactic acid 1.9, repeat lactic acid 1.4.     Review of systems:    In addition to the HPI above,    All other systems reviewed and are negative.    Past History of the following :    Past Medical History:  Diagnosis Date   Anemia    Arthritis    At risk for sleep apnea    STOP-BANG= 7    SENT TO PCP 06-29-2014   CKD (chronic kidney disease), stage II    montitored by nephrologist   Depression        Diabetic neuropathy (HCC)    GERD (gastroesophageal reflux disease)    Headache    SINUS   History of kidney stones    History of retinal detachment    Hyperlipidemia    Hypertension    Legal blindness of left eye, as defined in [place]    SECONDARY TO RETAINAL DETACHMENT   Neuropathy    PVD (peripheral vascular disease) (  [place])    Restless leg syndrome    Retained ureteral stent    w/ encrustation SINCE 2012   Rotator cuff syndrome of right shoulder    Sleep apnea in adult    Stroke [facility])    ?  STROKE  JULY  2017   Type 2 diabetes mellitus ([place])    Vitamin D deficiency       Past Surgical History:  Procedure Laterality Date   ABDOMINAL AORTOGRAM W/LOWER EXTREMITY N/A 11/08/2021   Procedure: ABDOMINAL AORTOGRAM W/LOWER EXTREMITY;  Surgeon: Marty Heck, MD;  Location: [place] [facility];  Service: Cardiovascular;  Laterality: N/A;   AMPUTATION Bilateral 2012   Left big toe partial and right big toe complete   CARDIOVASCULAR STRESS TEST  04-05-2014  dr croitoru   low risk lexiscan nuclear study with mild diaphragmatic attenuation artifact/  no ischemia/  ef 58%   CATARACT EXTRACTION W/ INTRAOCULAR LENS  IMPLANT, BILATERAL  2013   CYSTO /  LEFT URETERAL STENT PLACEMENT  2012   CYSTOSCOPY W/  URETERAL STENT REMOVAL Left 07/07/2014   Procedure: CYSTO WITH LEFT PORTION STENT REMOVAL;  Surgeon: Malka So, MD;  Location: [facility];  Service: Urology;  Laterality: Left;   CYSTOSCOPY WITH URETEROSCOPY AND STENT PLACEMENT Left 07/07/2014   Procedure: CYSTOLITHALOPAXY URETEROSCOPY WITH STENT;  Surgeon: Malka So, MD;  Location: [facility];  Service: Urology;  Laterality: Left;   ENDARTERECTOMY Right 04/04/2016   Procedure: ENDARTERECTOMY CAROTID ARTERY RIGHT;  Surgeon: Angelia Mould, MD;  Location: [place];  Service: Vascular;  Laterality: Right;   HOLMIUM LASER APPLICATION N/A 81/08/5724   Procedure: HOLMIUM LASER APPLICATION;  Surgeon: Malka So, MD;  Location: [facility];  Service: Urology;  Laterality: N/A;   I & D  INFECTED SPIDER BITE UPPER BACK  06/ 2012   NEPHROLITHOTOMY Left 08/16/2014   Procedure: LEFT NEPHROLITHOTOMY PERCUTANEOUS;  Surgeon: Malka So, MD;  Location: [facility];  Service: Urology;  Laterality: Left;   NEPHROLITHOTOMY Left 08/18/2014   Procedure: LEFT NEPHROLITHOTOMY PERCUTANEOUS SECOND LOOK;  Surgeon: Malka So, MD;  Location: [facility];  Service: Urology;  Laterality: Left;   PATCH ANGIOPLASTY Right 04/04/2016   Procedure: PATCH ANGIOPLASTY RIGHT CAROTID ARTERY;  Surgeon: Angelia Mould, MD;  Location: [place];  Service: Vascular;  Laterality: Right;   PERIPHERAL VASCULAR BALLOON ANGIOPLASTY  11/08/2021   Procedure: PERIPHERAL VASCULAR BALLOON ANGIOPLASTY;  Surgeon: Marty Heck, MD;  Location: [place] [facility];  Service: Cardiovascular;;  Left AT   RETINAL DETACHMENT SURGERY Bilateral 2013   TONSILLECTOMY AND ADENOIDECTOMY  as child      Social History:      Social History   Tobacco Use   Smoking status: Never   Smokeless tobacco: Never  Substance Use Topics   Alcohol use: Not Currently    Comment: Occassionally       Family History :     Family History  Problem Relation Age  of Onset   Cancer Mother        Throat   Stroke Neg Hx       Home Medications:   Prior to Admission medications   Medication Sig Start Date End Date Taking? Authorizing Provider  Amino Acids-Protein Hydrolys (PRO-STAT PO) Take 30 mLs by mouth in the morning and at bedtime.   Yes [provider]  amLODipine (NORVASC) 10 MG tablet Take 10 mg by mouth daily.   Yes  [provider]  aspirin EC 81 MG tablet Take 81 mg by mouth daily.   Yes [provider]  atorvastatin (LIPITOR) 40 MG tablet Take 40 mg by mouth daily.   Yes [provider]  Cholecalciferol 1.25 MG (50000 UT) TABS Take 1.25 mg by mouth once a week. Mondays   Yes [provider]  Cholecalciferol 100 MCG (4000 UT) TABS Take 1 tablet by mouth daily.   Yes [provider]  clopidogrel (PLAVIX) 75 MG tablet Take 1 tablet (75 mg total) by mouth daily. 11/08/21 11/08/22 Yes Marty Heck, MD  ferrous sulfate 325 (65 FE) MG tablet Take 325 mg by mouth daily with breakfast.   Yes [provider]  hydrALAZINE (APRESOLINE) 25 MG tablet Take 25 mg by mouth 3 (three) times daily.   Yes [provider]  icosapent Ethyl (VASCEPA) 1 g capsule Take 2 g by mouth 2 (two) times daily. 02/01/22  Yes [provider]  insulin glargine (LANTUS) 100 UNIT/ML injection Inject 40 Units into the skin at bedtime.   Yes [provider]  losartan (COZAAR) 50 MG tablet Take 50 mg by mouth daily. 03/28/22  Yes [provider]  Multiple Vitamin (MULTIVITAMIN) tablet Take 1 tablet by mouth daily.   Yes [provider]  Omega-3 1000 MG CAPS Take 1 capsule by mouth every morning.   Yes [provider]  OXYGEN Inhale 4 L into the lungs as needed (hypoxia).   Yes [provider]  OYSTER SHELL CALCIUM PO Take 500 mg by mouth daily.   Yes [provider]  vitamin B-12 (CYANOCOBALAMIN) 500 MCG tablet Take 500 mcg by mouth daily.   Yes  [provider]  vitamin C (ASCORBIC ACID) 500 MG tablet Take 1,000 mg by mouth 2 (two) times daily.   Yes [provider]  CIPROFLOXACIN IV Inject 200 mLs into the vein. Q 12 hours until 11/09/2021 Patient not taking: Reported on 05/12/2022    [provider]     Allergies:    No Known Allergies   Physical Exam:   Vitals  Blood pressure (!) 144/56, pulse (!) 104, temperature 99.6 F (37.6 C), temperature source Oral, resp. rate (!) 27, height 5' 10.5" (1.791 m), weight 115.2 kg, SpO2 90 %.  1.  General: Appears in no acute distress  2. Psychiatric: Alert, oriented x3, intact insight and judgment  3. Neurologic: Cranial nerves II through XII grossly intact, no focal deficit noted  4. HEENMT:  Atraumatic normocephalic, extraocular muscles are intact  5. Respiratory : Reduced breath sounds at lung bases  6. Cardiovascular : S1-S2, regular, no murmur auscultated  7. Gastrointestinal:  Abdomen is soft, nontender, no organomegaly  8.  Musculoskeletal Bilateral toe amputations noted, unable to palpate peripheral pulses, mild erythema and warmth noted in left foot, nontender to palpation     Data Review:    CBC Recent Labs  Lab 05/12/22 0927  WBC 15.6*  HGB 10.0*  HCT 29.0*  PLT 217  MCV 87.6  MCH 30.2  MCHC 34.5  RDW 12.3  LYMPHSABS 0.3*  MONOABS 0.5  EOSABS 0.0  BASOSABS 0.0   ------------------------------------------------------------------------------------------------------------------  Results for orders placed or performed during the hospital encounter of 05/12/22 (from the past 48 hour(s))  Lactic acid, plasma     Status: None   Collection Time: 05/12/22  9:27 AM  Result Value Ref Range   Lactic Acid, Venous 1.9 0.5 - 1.9 mmol/L    Comment: Performed  at [facility], [address] [place], [place] [address]  Comprehensive metabolic panel     Status: Abnormal   Collection Time: 05/12/22  9:27 AM  Result Value Ref  Range   Sodium 127 (L) 135 - 145 mmol/L   Potassium 4.3 3.5 - 5.1 mmol/L   Chloride 93 (L) 98 - 111 mmol/L   CO2 21 (L) 22 - 32 mmol/L   Glucose, Bld 230 (H) 70 - 99 mg/dL    Comment: Glucose reference range applies only to samples taken after fasting for at least 8 hours.   BUN 49 (H) 6 - 20 mg/dL   Creatinine, Ser 3.61 (H) 0.61 - 1.24 mg/dL   Calcium 7.9 (L) 8.9 - 10.3 mg/dL   Total Protein 6.7 6.5 - 8.1 g/dL   Albumin 3.1 (L) 3.5 - 5.0 g/dL   AST 40 15 - 41 U/L   ALT 19 0 - 44 U/L   Alkaline Phosphatase 65 38 - 126 U/L   Total Bilirubin 1.4 (H) 0.3 - 1.2 mg/dL   GFR, Estimated 19 (L) >60 mL/min    Comment: (NOTE) Calculated using the CKD-EPI Creatinine Equation (2021)    Anion gap 13 5 - 15    Comment: Performed at [facility], [address] [place], [place] [address]  CBC with Differential     Status: Abnormal   Collection Time: 05/12/22  9:27 AM  Result Value Ref Range   WBC 15.6 (H) 4.0 - 10.5 K/uL   RBC 3.31 (L) 4.22 - 5.81 MIL/uL   Hemoglobin 10.0 (L) 13.0 - 17.0 g/dL   HCT 29.0 (L) 39.0 - 52.0 %   MCV 87.6 80.0 - 100.0 fL   MCH 30.2 26.0 - 34.0 pg   MCHC 34.5 30.0 - 36.0 g/dL   RDW 12.3 11.5 - 15.5 %   Platelets 217 150 - 400 K/uL   nRBC 0.0 0.0 - 0.2 %   Neutrophils Relative % 93 %   Neutro Abs 14.7 (H) 1.7 - 7.7 K/uL   Lymphocytes Relative 2 %   Lymphs Abs 0.3 (L) 0.7 - 4.0 K/uL   Monocytes Relative 4 %   Monocytes Absolute 0.5 0.1 - 1.0 K/uL   Eosinophils Relative 0 %   Eosinophils Absolute 0.0 0.0 - 0.5 K/uL   Basophils Relative 0 %   Basophils Absolute 0.0 0.0 - 0.1 K/uL   Immature Granulocytes 1 %   Abs Immature Granulocytes 0.09 (H) 0.00 - 0.07 K/uL    Comment: Performed at [facility], [address] [place], [place] [address]  Protime-INR     Status: None   Collection Time: 05/12/22  9:27 AM  Result Value Ref Range   Prothrombin Time 15.0 11.4 - 15.2 seconds   INR 1.2 0.8 - 1.2    Comment: (NOTE) INR goal varies based on device and disease  states. Performed at [facility], [address], [place], [place] [address]   APTT     Status: None   Collection Time: 05/12/22  9:27 AM  Result Value Ref Range   aPTT 29 24 - 36 seconds    Comment: Performed at [facility], [address] [place], [place] [address]  Urinalysis, Routine w reflex microscopic Urine, In & Out Cath     Status: Abnormal   Collection Time: 05/12/22  9:27 AM  Result Value Ref Range   Color, Urine YELLOW YELLOW   APPearance HAZY (A) CLEAR   Specific Gravity, Urine 1.014 1.005 - 1.030  pH 5.0 5.0 - 8.0   Glucose, UA NEGATIVE NEGATIVE mg/dL   Hgb urine dipstick NEGATIVE NEGATIVE   Bilirubin Urine NEGATIVE NEGATIVE   Ketones, ur NEGATIVE NEGATIVE mg/dL   Protein, ur >=300 (A) NEGATIVE mg/dL   Nitrite NEGATIVE NEGATIVE   Leukocytes,Ua NEGATIVE NEGATIVE   RBC / HPF 0-5 0 - 5 RBC/hpf   WBC, UA 0-5 0 - 5 WBC/hpf   Bacteria, UA NONE SEEN NONE SEEN   Squamous Epithelial / LPF 0-5 0 - 5    Comment: Performed at [facility], [address], [place], [place] [address]  Resp Panel by RT-PCR (Flu A&B, Covid) Anterior Nasal Swab     Status: None   Collection Time: 05/12/22  9:37 AM   Specimen: Anterior Nasal Swab  Result Value Ref Range   SARS Coronavirus 2 by RT PCR NEGATIVE NEGATIVE    Comment: (NOTE) SARS-CoV-2 target nucleic acids are NOT DETECTED.  The SARS-CoV-2 RNA is generally detectable in upper respiratory specimens during the acute phase of infection. The lowest concentration of SARS-CoV-2 viral copies this assay can detect is 138 copies/mL. A negative result does not preclude SARS-Cov-2 infection and should not be used as the sole basis for treatment or other patient management decisions. A negative result may occur with  improper specimen collection/handling, submission of specimen other than nasopharyngeal swab, presence of viral mutation(s) within the areas targeted by this assay, and inadequate number of viral copies(<138 copies/mL). A  negative result must be combined with clinical observations, patient history, and epidemiological information. The expected result is Negative.  Fact Sheet for Patients:  EntrepreneurPulse.com.au  Fact Sheet for Healthcare Providers:  IncredibleEmployment.be  This test is no t yet approved or cleared by the [place] FDA and  has been authorized for detection and/or diagnosis of SARS-CoV-2 by FDA under an Emergency Use Authorization (EUA). This EUA will remain  in effect (meaning this test can be used) for the duration of the COVID-19 declaration under Section 564(b)(1) of the Act, 21 U.S.C.section 360bbb-3(b)(1), unless the authorization is terminated  or revoked sooner.       Influenza A by PCR NEGATIVE NEGATIVE   Influenza B by PCR NEGATIVE NEGATIVE    Comment: (NOTE) The Xpert Xpress SARS-CoV-2/FLU/RSV plus assay is intended as an aid in the diagnosis of influenza from Nasopharyngeal swab specimens and should not be used as a sole basis for treatment. Nasal washings and aspirates are unacceptable for Xpert Xpress SARS-CoV-2/FLU/RSV testing.  Fact Sheet for Patients: EntrepreneurPulse.com.au  Fact Sheet for Healthcare Providers: IncredibleEmployment.be  This test is not yet approved or cleared by the [place] FDA and has been authorized for detection and/or diagnosis of SARS-CoV-2 by FDA under an Emergency Use Authorization (EUA). This EUA will remain in effect (meaning this test can be used) for the duration of the COVID-19 declaration under Section 564(b)(1) of the Act, 21 U.S.C. section 360bbb-3(b)(1), unless the authorization is terminated or revoked.  Performed at [facility], [address], [place], [place] [address]   Blood Culture (routine x 2)     Status: None (Preliminary result)   Collection Time: 05/12/22 10:33 AM   Specimen: BLOOD  Result Value Ref Range   Specimen  Description BLOOD LEFT ANTECUBITAL    Special Requests      BOTTLES DRAWN AEROBIC AND ANAEROBIC Blood Culture adequate volume Performed at [facility], [place] [address] [place], [place] [address]    Culture PENDING    Report Status PENDING  Blood gas, venous (at [facility], not at [facility])     Status: Abnormal   Collection Time: 05/12/22 10:33 AM  Result Value Ref Range   pH, Ven 7.39 7.25 - 7.43   pCO2, Ven 38 (L) 44 - 60 mmHg   pO2, Ven 54 (H) 32 - 45 mmHg   Bicarbonate 22.7 20.0 - 28.0 mmol/L   Acid-base deficit 1.3 0.0 - 2.0 mmol/L   O2 Saturation 79.2 %   Patient temperature 38.8    Collection site BLOOD LEFT HAND    Drawn by [id number]     Comment: Performed at [facility], [address] [place], [place] [address]  Lactic acid, plasma     Status: None   Collection Time: 05/12/22 11:20 AM  Result Value Ref Range   Lactic Acid, Venous 1.4 0.5 - 1.9 mmol/L    Comment: Performed at [facility], [address], [place], [place] [address]  Blood gas, arterial     Status: Abnormal   Collection Time: 05/12/22  4:35 PM  Result Value Ref Range   FIO2 100 %   pH, Arterial 7.41 7.35 - 7.45   pCO2 arterial 36 32 - 48 mmHg   pO2, Arterial 56 (L) 83 - 108 mmHg   Bicarbonate 22.6 20.0 - 28.0 mmol/L   Acid-base deficit 1.1 0.0 - 2.0 mmol/L   O2 Saturation 84.6 %   Patient temperature 38.3    Collection site RIGHT RADIAL    Drawn by [id number]    Allens test (pass/fail) PASS PASS    Comment: Performed at [facility], [address], [place], [place] [address]    Chemistries  Recent Labs  Lab 05/12/22 0927  NA 127*  K 4.3  CL 93*  CO2 21*  GLUCOSE 230*  BUN 49*  CREATININE 3.61*  CALCIUM 7.9*  AST 40  ALT 19  ALKPHOS 65  BILITOT 1.4*    ------------------------------------------------------------------------------------------------------------------  ------------------------------------------------------------------------------------------------------------------ GFR: Estimated Creatinine Clearance: 28.2 mL/min (A) (by C-G formula based on SCr of 3.61 mg/dL (H)). Liver Function Tests: Recent Labs  Lab 05/12/22 0927  AST 40  ALT 19  ALKPHOS 65  BILITOT 1.4*  PROT 6.7  ALBUMIN 3.1*   No results for input(s): "LIPASE", "AMYLASE" in the last 168 hours. No results for input(s): "AMMONIA" in the last 168 hours. Coagulation Profile: Recent Labs  Lab 05/12/22 0927  INR 1.2   Cardiac Enzymes: No results for input(s): "CKTOTAL", "CKMB", "CKMBINDEX", "TROPONINI" in the last 168 hours. BNP (last 3 results) No results for input(s): "PROBNP" in the last 8760 hours. HbA1C: No results for input(s): "HGBA1C" in the last 72 hours. CBG: No results for input(s): "GLUCAP" in the last 168 hours. Lipid Profile: No results for input(s): "CHOL", "HDL", "LDLCALC", "TRIG", "CHOLHDL", "LDLDIRECT" in the last 72 hours. Thyroid Function Tests: No results for input(s): "TSH", "T4TOTAL", "FREET4", "T3FREE", "THYROIDAB" in the last 72 hours. Anemia Panel: No results for input(s): "VITAMINB12", "FOLATE", "FERRITIN", "TIBC", "IRON", "RETICCTPCT" in the last 72 hours.  --------------------------------------------------------------------------------------------------------------- Urine analysis:    Component Value Date/Time   COLORURINE YELLOW 05/12/2022 0927   APPEARANCEUR HAZY (A) 05/12/2022 0927   LABSPEC 1.014 05/12/2022 0927   PHURINE 5.0 05/12/2022 [place] 05/12/2022 0927   HGBUR NEGATIVE 05/12/2022 0927   BILIRUBINUR NEGATIVE 05/12/2022 0927   KETONESUR NEGATIVE 05/12/2022 0927   PROTEINUR >=300 (A) 05/12/2022 0927   UROBILINOGEN 0.2 05/31/2015 0752   NITRITE NEGATIVE 05/12/2022 0927   LEUKOCYTESUR  NEGATIVE 05/12/2022 6384  Imaging Results:    DG Foot 2 Views Left  Result Date: 05/12/2022 CLINICAL DATA:  Amputation for osteomyelitis. EXAM: LEFT FOOT - 2 VIEW COMPARISON:  09/25/2021. FINDINGS: Since the previous exam, there has been a wide forefoot amputation. The great toe is been resected as has the second toe and a small portion of the second metatarsal head. Distal aspects of the third and fourth metatarsals have been resected along with the third and fourth toes. Fifth metatarsal has been resected from the midshaft distally including the fifth toe. Resected margins are well-defined. No areas of bone resorption to suggest osteomyelitis. There is surrounding forefoot soft tissue swelling. No soft tissue air. IMPRESSION: 1. Forefoot amputations as detailed with surrounding soft tissue swelling, but no evidence of osteomyelitis. Electronically Signed   By: Lajean Manes M.D.   On: 05/12/2022 10:09   DG Chest Port 1 View  Result Date: 05/12/2022 CLINICAL DATA:  Shortness of breath, cough, questionable sepsis EXAM: PORTABLE CHEST 1 VIEW COMPARISON:  Chest x-ray October 05, 2021 FINDINGS: Stable cardiomediastinal contours. Diffuse bilateral interstitial and heterogeneous pulmonary opacities. No large pleural effusion or pneumothorax. No acute osseous abnormality. The visualized upper abdomen is unremarkable. IMPRESSION: Diffuse interstitial and heterogeneous pulmonary opacities. Differential considerations include infectious or inflammatory etiology or pulmonary edema. Electronically Signed   By: Beryle Flock M.D.   On: 05/12/2022 09:48    My personal review of EKG: Rhythm NSR, no ST changes   Assessment & Plan:    Principal Problem:   Acute hypoxemic respiratory failure (HCC)   Acute hypoxemic respiratory failure-unclear etiology, likely from community-acquired pneumonia.  Patient was started on vancomycin, will add cefepime per pharmacy consultation.  We will obtain procalcitonin.   Blood cultures x2 have been obtained.  We will follow blood culture results.  SARS-CoV-2 RT-PCR, influenza AMB were negative.  Patient will be continued on HFNC 25 L/min.  Consider pulmonary consultation in a.m.  We will also check BNP, he was recently seen by cardiology on 05/07/2022 at [facility], he was ruled out of CHF by cardiology.  Will obtain echocardiogram in a.m. Sepsis-presented with fever, tachycardia, leukocytosis WBC 15,000 and likely source of infection from pneumonia.  Started on vancomycin and cefepime.  Blood pressure is stable. Peripheral vascular disease/status post bilateral toe amputations-x-ray of left foot showed no osteomyelitis but shows forefoot amputation with surrounding soft tissue swelling.  Patient has been started on vancomycin for possible cellulitis involving the left foot.  We will also check ESR.  Continue aspirin, Plavix.  He has been followed by vascular surgery as outpatient.  Recently seen at [facility] wound care clinic where he was found to have palpable dorsalis pedis pulse. Hyponatremia, mild-likely in setting of acute kidney injury with dehydration.  Started on IV normal saline at 75 mill per hour.  Follow serum sodium in a.m. Acute kidney injury on CKD stage IV-patient baseline creatinine is around 3, today presenting with creatinine of 3.61 with BUN 49.  Started on IV fluids as above.  Follow BMP in am. Diabetes mellitus type 2-continue home medication Lantus 40 units subcu daily at bedtime.  Will initiate sliding scale insulin with NovoLog.  Check CBG before every meal and at bedtime. Hypertension-blood pressure is stable.  Will hold Cozaar due to renal insufficiency.  We will also hold hydralazine    DVT Prophylaxis-   Heparin  AM Labs Ordered, also please review Full Orders  Family Communication: Admission, patients condition and plan of care including tests being ordered have been  discussed with the patient  who indicate understanding and agree with the plan and  Code Status.  Code Status: Full code  Admission status: Inpatient :The appropriate admission status for this patient is INPATIENT. Inpatient status is judged to be reasonable and necessary in order to provide the required intensity of service to ensure the patient's safety. The patient's presenting symptoms, physical exam findings, and initial radiographic and laboratory data in the context of their chronic comorbidities is felt to place them at high risk for further clinical deterioration. Furthermore, it is not anticipated that the patient will be medically stable for discharge from the hospital within 2 midnights of admission. The following factors support the admission status of inpatient.      Time spent in minutes : 60 minutes   Haley Fuerstenberg S Stephen Baruch M.D

## 2022-05-12 NOTE — Progress Notes (Signed)
Pharmacy Antibiotic Note  Oscar Rose a 61 y.o. male admitted on 05/12/2022 with sepsis.  Pharmacy has been consulted for vancomycin dosing. Cellulitis and hx of osteomyelitis noted.   Plan: Vancomycin 1500mg  IV every 48 hours.  Goal trough 15-20 mcg/mL.  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  STROKE  JULY  2017   Type 2 diabetes mellitus (HCC)    Vitamin D deficiency     Allergies:  No Known Allergies  Filed Weights   05/12/22 0922  Weight: 115.2 kg (254 lb)       Latest Ref Rng & Units 05/12/2022    9:27 AM 11/08/2021    7:28 AM 04/13/2019    4:01 PM  CBC  WBC 4.0 - 10.5 K/uL 15.6   7.8   Hemoglobin 13.0 - 17.0 g/dL 10.0  12.2  14.6   Hematocrit 39.0 - 52.0 % 29.0  36.0  42.8   Platelets 150 - 400 K/uL 217   211      Estimated Creatinine Clearance: 28.2 mL/min (A) (by C-G formula based on SCr of 3.61 mg/dL (H)).  Antibiotics Given (last 72 hours)     None       Antimicrobials this admission:  vancomycin 05/12/2022  >>   Microbiology results: 05/12/2022  BCx: sent 05/12/2022  UCx: sent 05/12/2022  Resp Panel: sent   Thank you for allowing pharmacy to be a part of this patient's care.  Thomasenia Sales, PharmD Clinical Pharmacist

## 2022-05-12 NOTE — ED Notes (Signed)
EDP and RT aware of pt saturation.

## 2022-05-12 NOTE — ED Notes (Signed)
Patient again, refusing Bipap. I spoke with him about the [place]. He agreed to this device. It has been placed FIO@ 100% and Liter flow set to 25. I am going to do a follow up ABG to assess.

## 2022-05-12 NOTE — ED Provider Notes (Signed)
I discussed the care with Dr. Darrick Meigs of the hospitalist service was been kind enough to evaluate this patient for admission   Noemi Chapel, MD 05/12/22 1928

## 2022-05-12 NOTE — ED Triage Notes (Signed)
Pt brought in by [facility] from [facility] with c/o SOB, nasal congestion, cough, decreased appetite x 1 week. Nursing staff reports they found pt pale, clammy, labored breathing and disoriented this morning with an O2 sat of 30% on RA. Pt was placed on NRB and when EMS arrived his O2 sat was 70%. Albuterol, Atrovent, Rocephin 1gm IM and 500 NS bolus given by EMS. O2 sat up to 89% right before arrival per EMS. EMS Vitals - temp 100.1, HR 130, SBP 120 (initially 90). Pt arrives to ED alert and oriented with O2 sat of 80% on NRB. Dr. Pearline Cables notified. Code Sepsis called en route by EMS.

## 2022-05-12 NOTE — ED Notes (Signed)
RT assessed patient. Patient does not want Bipap if possible. He has tried CPAP in the past. Ear pulse ox probe placed and SPO2 was 93%-96% on NRB. Patient does not seem to have increased WOB. RR is still in high 30s.He states that he feels better. RT will continue to monitor. RN notified.

## 2022-05-13 ENCOUNTER — Other Ambulatory Visit (HOSPITAL_COMMUNITY): Payer: Self-pay | Admitting: *Deleted

## 2022-05-13 ENCOUNTER — Inpatient Hospital Stay (HOSPITAL_COMMUNITY): Payer: Medicare Other

## 2022-05-13 DIAGNOSIS — J9601 Acute respiratory failure with hypoxia: Secondary | ICD-10-CM

## 2022-05-13 DIAGNOSIS — I509 Heart failure, unspecified: Secondary | ICD-10-CM | POA: Diagnosis not present

## 2022-05-13 LAB — SEDIMENTATION RATE: Sed Rate: 85 mm/hr — ABNORMAL HIGH (ref 0–16)

## 2022-05-13 LAB — HIV ANTIBODY (ROUTINE TESTING W REFLEX): HIV Screen 4th Generation wRfx: NONREACTIVE

## 2022-05-13 LAB — ECHOCARDIOGRAM COMPLETE
Area-P 1/2: 4.09 cm2
Calc EF: 48 %
Height: 71 in
S' Lateral: 3.6 cm
Single Plane A2C EF: 49 %
Single Plane A4C EF: 41.7 %
Weight: 4059.99 oz

## 2022-05-13 LAB — HEMOGLOBIN A1C
Hgb A1c MFr Bld: 5.5 % (ref 4.8–5.6)
Mean Plasma Glucose: 111.15 mg/dL

## 2022-05-13 LAB — CBC
HCT: 26.3 % — ABNORMAL LOW (ref 39.0–52.0)
Hemoglobin: 9.2 g/dL — ABNORMAL LOW (ref 13.0–17.0)
MCH: 30.2 pg (ref 26.0–34.0)
MCHC: 35 g/dL (ref 30.0–36.0)
MCV: 86.2 fL (ref 80.0–100.0)
Platelets: 203 10*3/uL (ref 150–400)
RBC: 3.05 MIL/uL — ABNORMAL LOW (ref 4.22–5.81)
RDW: 12.3 % (ref 11.5–15.5)
WBC: 14.6 10*3/uL — ABNORMAL HIGH (ref 4.0–10.5)
nRBC: 0 % (ref 0.0–0.2)

## 2022-05-13 LAB — GLUCOSE, CAPILLARY
Glucose-Capillary: 144 mg/dL — ABNORMAL HIGH (ref 70–99)
Glucose-Capillary: 148 mg/dL — ABNORMAL HIGH (ref 70–99)
Glucose-Capillary: 160 mg/dL — ABNORMAL HIGH (ref 70–99)
Glucose-Capillary: 145 mg/dL — ABNORMAL HIGH (ref 70–99)

## 2022-05-13 LAB — CREATININE, SERUM
Creatinine, Ser: 3.98 mg/dL — ABNORMAL HIGH (ref 0.61–1.24)
GFR, Estimated: 17 mL/min — ABNORMAL LOW (ref >60)

## 2022-05-13 LAB — PROCALCITONIN: Procalcitonin: 24.85 ng/mL

## 2022-05-13 LAB — MRSA NEXT GEN BY PCR, NASAL: MRSA by PCR Next Gen: NOT DETECTED

## 2022-05-13 MED ORDER — VITAMIN B-12 100 MCG PO TABS
500.0000 ug | ORAL_TABLET | Freq: Every day | ORAL | Status: DC
  Administered 2022-05-13 – 2022-05-18 (×6): 500 ug via ORAL
  Filled 2022-05-13 (×7): qty 5

## 2022-05-13 MED ORDER — ENOXAPARIN SODIUM 60 MG/0.6ML IJ SOSY: 60.0000 mg | PREFILLED_SYRINGE | INTRAMUSCULAR | Status: DC

## 2022-05-13 MED ORDER — HEPARIN SODIUM (PORCINE) 5000 UNIT/ML IJ SOLN
5000.0000 [IU] | Freq: Three times a day (TID) | INTRAMUSCULAR | Status: DC
  Administered 2022-05-13 (×2): 5000 [IU] via SUBCUTANEOUS
  Filled 2022-05-13 (×2): qty 1

## 2022-05-13 MED ORDER — ALBUTEROL SULFATE (2.5 MG/3ML) 0.083% IN NEBU: 2.5000 mg | INHALATION_SOLUTION | RESPIRATORY_TRACT | Status: DC | PRN

## 2022-05-13 MED ORDER — DM-GUAIFENESIN ER 30-600 MG PO TB12
1.0000 | ORAL_TABLET | Freq: Two times a day (BID) | ORAL | Status: DC
  Administered 2022-05-13 – 2022-05-18 (×10): 1 via ORAL
  Filled 2022-05-13 (×10): qty 1

## 2022-05-13 MED ORDER — PANTOPRAZOLE SODIUM 40 MG IV SOLR
40.0000 mg | INTRAVENOUS | Status: DC
  Administered 2022-05-13 – 2022-05-17 (×5): 40 mg via INTRAVENOUS
  Filled 2022-05-13 (×5): qty 10

## 2022-05-13 MED ORDER — ENOXAPARIN SODIUM 120 MG/0.8ML IJ SOSY
1.0000 mg/kg | PREFILLED_SYRINGE | INTRAMUSCULAR | Status: DC
  Administered 2022-05-13: 114 mg via SUBCUTANEOUS
  Filled 2022-05-13: qty 0.8

## 2022-05-13 MED ORDER — IPRATROPIUM-ALBUTEROL 0.5-2.5 (3) MG/3ML IN SOLN
3.0000 mL | Freq: Four times a day (QID) | RESPIRATORY_TRACT | Status: DC
  Administered 2022-05-13: 3 mL via RESPIRATORY_TRACT

## 2022-05-13 MED ORDER — AMLODIPINE BESYLATE 5 MG PO TABS
10.0000 mg | ORAL_TABLET | Freq: Every day | ORAL | Status: DC
  Administered 2022-05-13 – 2022-05-18 (×6): 10 mg via ORAL
  Filled 2022-05-13 (×7): qty 2

## 2022-05-13 MED ORDER — SODIUM CHLORIDE 0.9 % IV SOLN: 250.0000 mL | INTRAVENOUS | Status: DC | PRN

## 2022-05-13 MED ORDER — ATORVASTATIN CALCIUM 40 MG PO TABS
40.0000 mg | ORAL_TABLET | Freq: Every day | ORAL | Status: DC
  Administered 2022-05-13 – 2022-05-18 (×6): 40 mg via ORAL
  Filled 2022-05-13 (×7): qty 1

## 2022-05-13 MED ORDER — PERFLUTREN LIPID MICROSPHERE
1.0000 mL | INTRAVENOUS | Status: AC | PRN
  Administered 2022-05-13: 2 mL via INTRAVENOUS

## 2022-05-13 MED ORDER — CLOPIDOGREL BISULFATE 75 MG PO TABS
75.0000 mg | ORAL_TABLET | Freq: Every day | ORAL | Status: DC
  Administered 2022-05-13 – 2022-05-18 (×6): 75 mg via ORAL
  Filled 2022-05-13 (×6): qty 1

## 2022-05-13 MED ORDER — ASPIRIN 81 MG PO TBEC
81.0000 mg | DELAYED_RELEASE_TABLET | Freq: Every day | ORAL | Status: DC
  Administered 2022-05-13 – 2022-05-18 (×6): 81 mg via ORAL
  Filled 2022-05-13 (×6): qty 1

## 2022-05-13 MED ORDER — ICOSAPENT ETHYL 1 G PO CAPS
2.0000 g | ORAL_CAPSULE | Freq: Two times a day (BID) | ORAL | Status: DC
  Administered 2022-05-13 – 2022-05-18 (×11): 2 g via ORAL
  Filled 2022-05-13 (×16): qty 2

## 2022-05-13 MED ORDER — INSULIN GLARGINE-YFGN 100 UNIT/ML ~~LOC~~ SOLN
40.0000 [IU] | Freq: Every day | SUBCUTANEOUS | Status: DC
  Administered 2022-05-13 – 2022-05-17 (×6): 40 [IU] via SUBCUTANEOUS
  Filled 2022-05-13 (×7): qty 0.4

## 2022-05-13 MED ORDER — SODIUM CHLORIDE 0.9 % IV SOLN: INTRAVENOUS | Status: AC

## 2022-05-13 MED ORDER — IPRATROPIUM-ALBUTEROL 0.5-2.5 (3) MG/3ML IN SOLN
3.0000 mL | Freq: Four times a day (QID) | RESPIRATORY_TRACT | Status: DC | PRN
  Filled 2022-05-13: qty 3

## 2022-05-13 MED ORDER — CHLORHEXIDINE GLUCONATE CLOTH 2 % EX PADS
6.0000 | MEDICATED_PAD | Freq: Every day | CUTANEOUS | Status: DC
  Administered 2022-05-13 – 2022-05-18 (×6): 6 via TOPICAL

## 2022-05-13 MED ORDER — SODIUM CHLORIDE 0.9% FLUSH: 3.0000 mL | INTRAVENOUS | Status: DC | PRN

## 2022-05-13 MED ORDER — INSULIN ASPART 100 UNIT/ML IJ SOLN
0.0000 [IU] | Freq: Three times a day (TID) | INTRAMUSCULAR | Status: DC
  Administered 2022-05-13 (×2): 3 [IU] via SUBCUTANEOUS
  Administered 2022-05-13: 2 [IU] via SUBCUTANEOUS
  Administered 2022-05-14: 3 [IU] via SUBCUTANEOUS
  Administered 2022-05-14 – 2022-05-17 (×3): 2 [IU] via SUBCUTANEOUS

## 2022-05-13 MED ORDER — SODIUM CHLORIDE 0.9% FLUSH
3.0000 mL | Freq: Two times a day (BID) | INTRAVENOUS | Status: DC
  Administered 2022-05-13 – 2022-05-18 (×12): 3 mL via INTRAVENOUS

## 2022-05-13 MED ORDER — SODIUM CHLORIDE 0.9 % IV SOLN
2.0000 g | Freq: Every day | INTRAVENOUS | Status: DC
  Administered 2022-05-13 – 2022-05-14 (×3): 2 g via INTRAVENOUS
  Filled 2022-05-13 (×3): qty 12.5

## 2022-05-13 NOTE — Progress Notes (Signed)
PROGRESS NOTE     Oscar Rose, is a 61 y.o. male, DOB - 1961/02/05, UJW:[id number]  Admit date - 05/12/2022   Admitting Physician Oswald Hillock, MD  Outpatient Primary MD for the patient is Dione Housekeeper, MD  LOS - 1  Chief Complaint  Patient presents with   Shortness of Breath        Brief Narrative:   61 y.o. 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5 mg/kg given poor renal function pending VQ scan study -ABG with PO2 of 56 patient on 100% FiO2 PCO2 of 36, pH was 7.41, with O2 sats of 84% -  2) sepsis secondary to Lt Foot infection/post transmetatarsal amputation left foot--imaging studies without osteo---POA -Patient with underlying peripheral artery disease -ESR is 85, procalcitonin 24.85, WBC 14.6 -continue vancomycin and cefepime pending culture data  3)AKI----acute kidney injury on CKD stage - 3B   creatinine on admission = 3.61  baseline creatinine = around 2.2   ,  creatinine is now= ending up to 3.98 - renally adjust medications, avoid nephrotoxic agents /  dehydration  / hypotension  4)DM2-A1c is 5.5 reflecting excellent diabetic control PTA -Continue Lantus insulin  Use Novolog/Humalog Sliding scale insulin with Accu-Cheks/Fingersticks as ordered   5)HTN----stable,  continue amlodipine  6)H/o PAD/prior Stroke--continue atorvastatin and Plavix for secondary prevention  7)GERD--continue Protonix  8) acute on chronic anemia--Hgb 9.2 baseline usually around 12 -Suspect chronic anemia of CKD -No bleeding concerns -Query hemodilution from sepsis fluid Rx  Disposition/Need for in-Hospital Stay- patient unable to be discharged at this time due to --acute hypoxic respiratory failure in the setting of sepsis need to rule out PE*  Status is: Inpatient   Disposition: The patient is from: SNF [facility]              Anticipated d/c is to: SNF              Anticipated d/c date is: > 3 days              Patient currently is not medically stable to d/c. Barriers: Not Clinically Stable-   Code Status :  -  Code Status: Full Code   Family Communication:    NA (patient is alert, awake and coherent)   DVT Prophylaxis  :   - SCDs  heparin injection 5,000 Units Start: 05/13/22 0600   Lab Results  Component Value Date   PLT 203 05/13/2022    Inpatient Medications  Scheduled Meds:  amLODipine  10 mg Oral Daily   aspirin EC  81 mg Oral Daily   atorvastatin  40 mg Oral Daily   Chlorhexidine Gluconate Cloth  6 each Topical Q0600   clopidogrel  75 mg Oral Daily   heparin  5,000 Units Subcutaneous Q8H   icosapent Ethyl  2 g Oral BID   insulin aspart  0-15 Units Subcutaneous TID WC   insulin glargine-yfgn  40 Units Subcutaneous QHS   pantoprazole (PROTONIX) IV  40 mg Intravenous Q24H   sodium chloride flush  3 mL Intravenous Q12H   cyanocobalamin  500 mcg Oral Daily   Continuous Infusions:  sodium chloride     ceFEPime (MAXIPIME) IV Stopped (05/13/22 0152)   [START ON 05/14/2022] vancomycin     PRN Meds:.sodium chloride, sodium  chloride flush   Anti-infectives (From admission, onward)    Start     Dose/Rate Route Frequency Ordered Stop   05/14/22 1000  vancomycin (VANCOREADY) IVPB 1500 mg/300 mL        1,500 mg 150 mL/hr over 120 Minutes Intravenous Every 48 hours 05/12/22 1026     05/13/22 0100  ceFEPIme (MAXIPIME) 2 g in sodium chloride 0.9 % 100 mL IVPB       Note to Pharmacy: Cefepime 2 g IV q24h for CrCl < 30 mL/min   2 g 200 mL/hr over 30 Minutes Intravenous Daily at bedtime 05/13/22 0041     05/12/22 1000  vancomycin (VANCOREADY) IVPB 2000 mg/400 mL        2,000 mg 200 mL/hr over 120 Minutes Intravenous  Once 05/12/22 0950 05/12/22 1230         Subjective: Oscar Rose today has no fevers, no emesis,  No chest pain,   - Shortness of breath persist -No productive cough  Objective: Vitals:   05/13/22 1500 05/13/22 1600 05/13/22 1700 05/13/22 1907  BP: (!) 157/65 (!) 148/62 (!) 120/40   Pulse: 97 97 95   Resp: (!) 24 (!) 23 20   Temp:    98.4 F (36.9 C)  TempSrc:    Oral  SpO2: 93% 94% 91%   Weight:      Height:        Intake/Output Summary (Last 24 hours) at 05/13/2022 1928 Last data filed at 05/13/2022 1819 Gross per 24 hour  Intake 796.48 ml  Output 1200 ml  Net -403.52 ml   Filed Weights   05/12/22 0922 05/13/22 0500  Weight: 115.2 kg 115.1 kg    Physical Exam  Gen:- Awake Alert, no acute distress HEENT:- [place].AT, No sclera icterus Neck-Supple Neck,No JVD,.  Lungs-  CTAB , fair symmetrical air movement CV- S1, S2 normal, regular  Abd-  +ve B.Sounds, Abd Soft, No tenderness,    Psych-affect is appropriate, oriented x3 Neuro-no new focal deficits, no tremors MSK-left foot with transmetatarsal amputation, right foot with prior big toe amputation  Data Reviewed: I have personally reviewed following labs and imaging studies  CBC: Recent Labs  Lab 05/12/22 0927 05/13/22 0126  WBC 15.6* 14.6*  NEUTROABS 14.7*  --   HGB 10.0* 9.2*  HCT 29.0* 26.3*  MCV 87.6 86.2  PLT  217 032   Basic Metabolic Panel: Recent Labs  Lab 05/12/22 0927 05/13/22 0126  NA 127*  --   K 4.3  --   CL 93*  --   CO2 21*  --   GLUCOSE 230*  --   BUN 49*  --   CREATININE 3.61* 3.98*  CALCIUM 7.9*  --    GFR: Estimated Creatinine Clearance: 25.8 mL/min (A) (by C-G formula based on SCr of 3.98 mg/dL (H)). Liver Function Tests: Recent Labs  Lab 05/12/22 0927  AST 40  ALT 19  ALKPHOS 65  BILITOT 1.4*  PROT 6.7  ALBUMIN 3.1*   Cardiac Enzymes: No results for input(s): "CKTOTAL", "CKMB", "CKMBINDEX", "TROPONINI" in the last 168 hours. BNP (last 3 results) No results for input(s): "PROBNP" in the last 8760 hours. HbA1C: Recent Labs    05/13/22 0127  HGBA1C 5.5   Sepsis Labs: @LABRCNTIP (procalcitonin:4,lacticidven:4) ) Recent Results (from the past 240 hour(s))  Blood Culture (routine x 2)     Status: None (Preliminary result)   Collection Time: 05/12/22  9:30 AM   Specimen: BLOOD LEFT FOREARM  Result Value Ref Range Status   Specimen Description BLOOD LEFT FOREARM  Final   Special Requests NONE  Final   Culture   Final    NO GROWTH < 24 HOURS Performed at [facility], [address], [place], [place] [address]    Report Status PENDING  Incomplete  Resp Panel by RT-PCR (Flu A&B, Covid) Anterior Nasal Swab     Status: None   Collection Time: 05/12/22  9:37 AM   Specimen: Anterior Nasal Swab  Result Value Ref Range Status   SARS Coronavirus 2 by RT PCR NEGATIVE NEGATIVE Final    Comment: (NOTE) SARS-CoV-2 target nucleic acids are NOT DETECTED.  The SARS-CoV-2 RNA is generally detectable in upper respiratory specimens during the acute phase of infection. The lowest concentration of SARS-CoV-2 viral copies this assay can detect is 138 copies/mL. A negative result does not preclude SARS-Cov-2 infection and should not be used as the sole basis for treatment or other patient management decisions. A negative result may occur with  improper specimen  collection/handling, submission of specimen other than nasopharyngeal swab, presence of viral mutation(s) within the areas targeted by this assay, and inadequate number of viral copies(<138 copies/mL). A negative result must be combined with clinical observations, patient history, and epidemiological information. The expected result is Negative.  Fact Sheet for Patients:  EntrepreneurPulse.com.au  Fact Sheet for Healthcare Providers:  IncredibleEmployment.be  This test is no t yet approved or cleared by the [place] FDA and  has been authorized for detection and/or diagnosis of SARS-CoV-2 by FDA under an Emergency Use Authorization (EUA). This EUA will remain  in effect (meaning this test can be used) for the duration of the COVID-19 declaration under Section 564(b)(1) of the Act, 21 U.S.C.section 360bbb-3(b)(1), unless the authorization is terminated  or revoked sooner.       Influenza A by PCR NEGATIVE NEGATIVE Final   Influenza B by PCR NEGATIVE NEGATIVE Final    Comment: (NOTE) The Xpert Xpress SARS-CoV-2/FLU/RSV plus assay is intended as an aid in the diagnosis of influenza from Nasopharyngeal swab specimens and should not be used as a sole basis for treatment. Nasal washings and aspirates are unacceptable for Xpert Xpress SARS-CoV-2/FLU/RSV testing.  Fact Sheet for Patients: EntrepreneurPulse.com.au  Fact Sheet for Healthcare Providers: IncredibleEmployment.be  This test is not yet approved or cleared by the [place] FDA and has been authorized for detection and/or diagnosis of SARS-CoV-2 by FDA under an Emergency Use Authorization (EUA). This EUA will remain in effect (meaning this test can be used) for the duration of the COVID-19 declaration under Section 564(b)(1) of the Act, 21 U.S.C. section 360bbb-3(b)(1), unless the authorization is terminated or revoked.  Performed at [facility], [address] [place],  [address]   Blood Culture (routine x 2)     Status: None (Preliminary result)   Collection Time: 05/12/22 10:33 AM   Specimen: BLOOD  Result Value Ref Range  Status   Specimen Description   Final    BLOOD LEFT ANTECUBITAL Performed at [facility], [place] [address], [place], [place] [address]    Special Requests   Final    BOTTLES DRAWN AEROBIC AND ANAEROBIC Blood Culture adequate volume Performed at [facility], [place] [address] [place], [place] [address]    Culture   Final    NO GROWTH < 24 HOURS Performed at [facility], [address] [place], [place] [address]    Report Status PENDING  Incomplete  MRSA Next Gen by PCR, Nasal     Status: None   Collection Time: 05/13/22  9:53 AM   Specimen: Nasal Mucosa; Nasal Swab  Result Value Ref Range Status   MRSA by PCR Next Gen NOT DETECTED NOT DETECTED Final    Comment: (NOTE) The GeneXpert MRSA Assay (FDA approved for NASAL specimens only), is one component of a comprehensive MRSA colonization surveillance program. It is not intended to diagnose MRSA infection nor to guide or monitor treatment for MRSA infections. Test performance is not FDA approved in patients less than 13 years old. Performed at [facility], [address] [place], [place] [address]       Radiology Studies: ECHOCARDIOGRAM COMPLETE  Result Date: 05/13/2022    ECHOCARDIOGRAM REPORT   Patient Name:   Oscar Rose Date of Exam: 05/13/2022 Medical Rec #:  [id number]      Height:       71.0 in Accession #:    [id number]     Weight:       253.7 lb Date of Birth:  10-13-1960       BSA:          2.333 m Patient Age:    32 years       BP:           141/52 mmHg Patient Gender: M              HR:           99 bpm. Exam Location:  [facility] Procedure: 2D Echo, Cardiac Doppler, Color Doppler and Intracardiac            Opacification Agent Indications:    I50.40* Unspecified combined  systolic (congestive) and diastolic                 (congestive) heart failure  History:        Patient has prior history of Echocardiogram examinations, most                 recent 02/15/2016. Stroke, Signs/Symptoms:Chest Pain and Dyspnea;                 Risk Factors:Hypertension and Diabetes.  Sonographer:    Roseanna Rainbow RDCS Referring Phys: Earnie Larsson LAMA  Sonographer Comments: Technically difficult study due to poor echo windows. Image acquisition challenging due to patient body habitus. IMPRESSIONS  1. Left ventricular ejection fraction, by estimation, is 45 to 50%. The left ventricle has mildly decreased function. The left ventricle demonstrates regional wall motion abnormalities (see scoring diagram/findings for description). There is mild concentric left ventricular hypertrophy. Left ventricular diastolic parameters are indeterminate. Elevated left ventricular end-diastolic pressure. There is hypokinesis of the left ventricular, apical anterior wall, inferior wall, lateral wall, inferolateral wall and apical segment.  2. Right ventricular systolic function is normal. The right ventricular size is normal. Tricuspid regurgitation signal is inadequate for assessing PA pressure.  3. The mitral valve is normal in structure. Trivial mitral valve regurgitation. No evidence of mitral stenosis.  4. The aortic valve is tricuspid. Aortic valve regurgitation is not visualized. Aortic valve sclerosis/calcification is present, without any evidence of aortic stenosis.  5. There is mild dilatation of the aortic root, measuring 41 mm. There is mild dilatation of the ascending aorta, measuring 39 mm.  6. The inferior vena cava is normal in size with greater than 50% respiratory variability, suggesting right atrial pressure of 3 mmHg. FINDINGS  Left Ventricle: Left ventricular ejection fraction, by estimation, is 45 to 50%. The left ventricle has mildly decreased function. The left ventricle demonstrates regional wall motion  abnormalities. Definity contrast agent was given IV to delineate the left ventricular endocardial borders. The left ventricular internal cavity size was normal in size. There is mild concentric left ventricular hypertrophy. Left ventricular diastolic parameters are indeterminate. Elevated left ventricular end-diastolic pressure. Right Ventricle: The right ventricular size is normal. No increase in right ventricular wall thickness. Right ventricular systolic function is normal. Tricuspid regurgitation signal is inadequate for assessing PA pressure. Left Atrium: Left atrial size was normal in size. Right Atrium: Right atrial size was normal in size. Pericardium: There is no evidence of pericardial effusion. Mitral Valve: The mitral valve is normal in structure. Trivial mitral valve regurgitation. No evidence of mitral valve stenosis. Tricuspid Valve: The tricuspid valve is normal in structure. Tricuspid valve regurgitation is trivial. No evidence of tricuspid stenosis. Aortic Valve: The aortic valve is tricuspid. Aortic valve regurgitation is not visualized. Aortic valve sclerosis/calcification is present, without any evidence of aortic stenosis. Pulmonic Valve: The pulmonic valve was normal in structure. Pulmonic valve regurgitation is not visualized. No evidence of pulmonic stenosis. Aorta: The aortic root is normal in size and structure. There is mild dilatation of the aortic root, measuring 41 mm. There is mild dilatation of the ascending aorta, measuring 39 mm. Venous: The inferior vena cava is normal in size with greater than 50% respiratory variability, suggesting right atrial pressure of 3 mmHg. IAS/Shunts: There is redundancy of the interatrial septum. No atrial level shunt detected by color flow Doppler.  LEFT VENTRICLE PLAX 2D LVIDd:         5.10 cm      Diastology LVIDs:         3.60 cm      LV e' medial:    3.05 cm/s LV PW:         1.20 cm      LV E/e' medial:  32.4 LV IVS:        1.30 cm      LV e'  lateral:   9.46 cm/s LVOT diam:     2.50 cm      LV E/e' lateral: 10.5 LV SV:         94 LV SV Index:   40 LVOT Area:     4.91 cm  LV Volumes (MOD) LV vol d, MOD A2C: 126.0 ml LV vol d, MOD A4C: 117.0 ml LV vol s, MOD A2C: 64.3 ml LV vol s, MOD A4C: 68.2 ml LV SV MOD A2C:     61.7 ml LV SV MOD A4C:     117.0 ml LV SV MOD BP:      62.6 ml RIGHT VENTRICLE             IVC RV S prime:     10.10 cm/s  IVC diam: 2.30 cm TAPSE (M-mode): 1.9 cm LEFT ATRIUM  Index        RIGHT ATRIUM           Index LA diam:        3.90 cm 1.67 cm/m   RA Area:     17.80 cm LA Vol (A2C):   53.9 ml 23.10 ml/m  RA Volume:   48.80 ml  20.92 ml/m LA Vol (A4C):   70.5 ml 30.22 ml/m LA Biplane Vol: 60.0 ml 25.72 ml/m  AORTIC VALVE LVOT Vmax:   110.00 cm/s LVOT Vmean:  74.800 cm/s LVOT VTI:    0.191 m  AORTA Ao Root diam: 4.10 cm Ao Asc diam:  3.90 cm MITRAL VALVE MV Area (PHT): 4.09 cm    SHUNTS MV Decel Time: 186 msec    Systemic VTI:  0.19 m MV E velocity: 98.87 cm/s  Systemic Diam: 2.50 cm MV A velocity: 94.10 cm/s MV E/A ratio:  1.05 Fransico Him MD Electronically signed by Fransico Him MD Signature Date/Time: 05/13/2022/2:56:10 PM    Final    DG Foot 2 Views Left  Result Date: 05/12/2022 CLINICAL DATA:  Amputation for osteomyelitis. EXAM: LEFT FOOT - 2 VIEW COMPARISON:  09/25/2021. FINDINGS: Since the previous exam, there has been a wide forefoot amputation. The great toe is been resected as has the second toe and a small portion of the second metatarsal head. Distal aspects of the third and fourth metatarsals have been resected along with the third and fourth toes. Fifth metatarsal has been resected from the midshaft distally including the fifth toe. Resected margins are well-defined. No areas of bone resorption to suggest osteomyelitis. There is surrounding forefoot soft tissue swelling. No soft tissue air. IMPRESSION: 1. Forefoot amputations as detailed with surrounding soft tissue swelling, but no evidence of  osteomyelitis. Electronically Signed   By: Lajean Manes M.D.   On: 05/12/2022 10:09   DG Chest Port 1 View  Result Date: 05/12/2022 CLINICAL DATA:  Shortness of breath, cough, questionable sepsis EXAM: PORTABLE CHEST 1 VIEW COMPARISON:  Chest x-ray October 05, 2021 FINDINGS: Stable cardiomediastinal contours. Diffuse bilateral interstitial and heterogeneous pulmonary opacities. No large pleural effusion or pneumothorax. No acute osseous abnormality. The visualized upper abdomen is unremarkable. IMPRESSION: Diffuse interstitial and heterogeneous pulmonary opacities. Differential considerations include infectious or inflammatory etiology or pulmonary edema. Electronically Signed   By: Beryle Flock M.D.   On: 05/12/2022 09:48     Scheduled Meds:  amLODipine  10 mg Oral Daily   aspirin EC  81 mg Oral Daily   atorvastatin  40 mg Oral Daily   Chlorhexidine Gluconate Cloth  6 each Topical Q0600   clopidogrel  75 mg Oral Daily   heparin  5,000 Units Subcutaneous Q8H   icosapent Ethyl  2 g Oral BID   insulin aspart  0-15 Units Subcutaneous TID WC   insulin glargine-yfgn  40 Units Subcutaneous QHS   pantoprazole (PROTONIX) IV  40 mg Intravenous Q24H   sodium chloride flush  3 mL Intravenous Q12H   cyanocobalamin  500 mcg Oral Daily   Continuous Infusions:  sodium chloride     ceFEPime (MAXIPIME) IV Stopped (05/13/22 0152)   [START ON 05/14/2022] vancomycin       LOS: 1 day    Roxan Hockey M.D on 05/13/2022 at 7:28 PM  Go to [email or web address] - for contact info  [facility] - Office  [phone]  If 7PM-7AM, please contact night-coverage [email or web address] 05/13/2022, 7:28 PM

## 2022-05-13 NOTE — TOC Initial Note (Signed)
Transition of Care [facility]) - Initial/Assessment Note    Patient Details  Name: Oscar Rose MRN: [id number] Date of Birth: May 22, 1961  [facility] CM/SW Contact:    Shade Flood, LCSW Phone Number: 05/13/2022, 11:01 AM  Clinical Narrative:                  Pt admitted from [facility]. Plan is for return there at dc. Updated Melissa at [facility]. TOC will follow.  Expected Discharge Plan: Long Term Nursing Home Barriers to Discharge: Continued Medical Work up   Patient Goals and CMS Choice Patient states their goals for this hospitalization and ongoing recovery are:: return to [facility]      Expected Discharge Plan and Services Expected Discharge Plan: [place] In-house Referral: Clinical Social Work     Living arrangements for the past 2 months: [place]                                      Prior Living Arrangements/Services Living arrangements for the past 2 months: [place] Lives with:: Facility Resident Patient language and need for interpreter reviewed:: Yes Do you feel safe going back to the place where you live?: Yes      Need for Family Participation in Patient Care: No (Comment) Care giver support system in place?: Yes (comment)   Criminal Activity/Legal Involvement Pertinent to Current Situation/Hospitalization: No - Comment as needed  Activities of Daily Living Home Assistive Devices/Equipment: Cane (specify quad or straight) ADL Screening (condition at time of admission) Patient's cognitive ability adequate to safely complete daily activities?: Yes Is the patient deaf or have difficulty hearing?: Yes Does the patient have difficulty seeing, even when wearing glasses/contacts?: No Does the patient have difficulty concentrating, remembering, or making decisions?: No Patient able to express need for assistance with ADLs?: Yes Does the patient have difficulty dressing or  bathing?: Yes Independently performs ADLs?: No Communication: Independent Dressing (OT): Needs assistance Is this a change from baseline?: Pre-admission baseline Grooming: Needs assistance Is this a change from baseline?: Pre-admission baseline Feeding: Independent Bathing: Needs assistance Is this a change from baseline?: Pre-admission baseline Toileting: Needs assistance Is this a change from baseline?: Pre-admission baseline In/Out Bed: Needs assistance Is this a change from baseline?: Change from baseline, expected to last >3 days Walks in Home: Independent with device (comment) Does the patient have difficulty walking or climbing stairs?: Yes Weakness of Legs: Both Weakness of Arms/Hands: None  Permission Sought/Granted                  Emotional Assessment       Orientation: : Oriented to Self, Oriented to Place, Oriented to  Time, Oriented to Situation Alcohol / Substance Use: Not Applicable Psych Involvement: No (comment)  Admission diagnosis:  Urinary retention [R33.9] Cellulitis of left lower extremity [L03.116] Acute respiratory failure with hypoxia ([place]) [J96.01] AKI (acute kidney injury) ([place]) [N17.9] Viral URI with cough [J06.9] Severe sepsis ([place]) [A41.9, R65.20] Acute hypoxemic respiratory failure ([place]) [J96.01] Pneumonia due to infectious organism, unspecified laterality, unspecified part of lung [J18.9] Acute hypoxic respiratory failure ([place]) [J96.01] Patient Active Problem List   Diagnosis Date Noted   Acute hypoxemic respiratory failure ([place]) 05/12/2022   Acute hypoxic respiratory failure ([place]) 05/12/2022   BPPV (benign paroxysmal positional vertigo) 04/30/2017   Carotid aneurysm, right ([place]) 04/04/2016   Cerebral infarction  due to unspecified mechanism    Essential hypertension    CVA (cerebral infarction) 02/13/2016   Dehydration 02/13/2016   Fall at home 02/13/2016   Hyponatremia 02/13/2016   Encephalopathy 02/13/2016   Hypertension     Type 2 diabetes mellitus ([place])    Legal blindness of left eye, as defined in [place]    CKD (chronic kidney disease), stage II    Renal stone 08/16/2014   Precordial pain 03/23/2014   Impingement syndrome of shoulder 02/02/2013   PCP:  Dione Housekeeper, MD Pharmacy:   [facility], [place] [address] Phone: [phone] Fax: [place], [place] - [address] [place] [address] Phone: [phone] Fax: [phone]     Social Determinants of Health (SDOH) Interventions    Readmission Risk Interventions     No data to display

## 2022-05-13 NOTE — Progress Notes (Signed)
eLink Physician-Brief Progress Note Patient Name: Oscar Rose DOB: 10-Mar-1961 MRN: [id number]   Date of Service  05/13/2022  HPI/Events of Note  61/M with DM, bilateral toe amputations, hypertension, CKD, dyslipidemia, brought to the ED due to hypoxia.   Pt was placed on BIPAP and now on HFNC.  COVID, flu negative.   Pt started on vancomycin in the ED.   BP 154/62, HR 92, RR 19, O2 sats 94%   Pt is sleeping, does not appear to be in distress.   CXR Diffuse interstitial and heterogeneous pulmonary opacities. Differential considerations include infectious or inflammatory etiology or pulmonary edema. Foot xray 1. Forefoot amputations as detailed with surrounding soft tissue swelling, but no evidence of osteomyelitis. Crea 3.98 BNP 643   eICU Interventions  Continue empiric antibiotics.  Continue aspirin and plavix. Repeat renal function in the AM.  Pt is on NS@75cc /hr.  Agree in holding off on diuretics given AKI. Insulin for glucose control.  Heparin for DVT prophylaxis.   Avoid nephrotoxic gents.       Intervention Category Evaluation Type: New Patient Evaluation  Elsie Lincoln 05/13/2022, 2:32 AM

## 2022-05-13 NOTE — NC FL2 (Signed)
[place] LEVEL OF CARE SCREENING TOOL     IDENTIFICATION  Patient Name: Oscar Rose Birthdate: [birth year] Sex: male Admission Date (Current Location): 05/12/2022  [facility] and [place] Number:  [employer] and Address:  [place] [address] [place]      Provider Number: [id number]  Attending Physician Name and Address:  Roxan Hockey, MD  Relative Name and Phone Number:       Current Level of Care: Hospital Recommended Level of Care: [place] Prior Approval Number:    Date Approved/Denied:   PASRR Number:    Discharge Plan: SNF    Current Diagnoses: Patient Active Problem List   Diagnosis Date Noted   Acute hypoxemic respiratory failure ([place]) 05/12/2022   Acute hypoxic respiratory failure ([place]) 05/12/2022   BPPV (benign paroxysmal positional vertigo) 04/30/2017   Carotid aneurysm, right ([place]) 04/04/2016   Cerebral infarction due to unspecified mechanism    Essential hypertension    CVA (cerebral infarction) 02/13/2016   Dehydration 02/13/2016   Fall at home 02/13/2016   Hyponatremia 02/13/2016   Encephalopathy 02/13/2016   Hypertension    Type 2 diabetes mellitus ([place])    Legal blindness of left eye, as defined in [place]    CKD (chronic kidney disease), stage II    Renal stone 08/16/2014   Precordial pain 03/23/2014   Impingement syndrome of shoulder 02/02/2013    Orientation RESPIRATION BLADDER Height & Weight     Self, Time, Situation, Place  Normal Continent Weight: 253 lb 12 oz (115.1 kg) Height:  5\' 11"  (180.3 cm)  BEHAVIORAL SYMPTOMS/MOOD NEUROLOGICAL BOWEL NUTRITION STATUS      Continent Diet (see dc summary)  AMBULATORY STATUS COMMUNICATION OF NEEDS Skin   Independent Verbally Normal                       Personal Care Assistance Level of Assistance  Bathing, Feeding, Dressing Bathing Assistance: Limited assistance Feeding assistance: Independent Dressing  Assistance: Limited assistance     Functional Limitations Info  Sight, Hearing, Speech Sight Info: Impaired Hearing Info: Adequate Speech Info: Adequate    SPECIAL CARE FACTORS FREQUENCY                       Contractures Contractures Info: Not present    Additional Factors Info  Code Status, Allergies Code Status Info: Full Allergies Info: NKA           Current Medications (05/13/2022):  This is the current hospital active medication list Current Facility-Administered Medications  Medication Dose Route Frequency Provider Last Rate Last Admin   0.9 %  sodium chloride infusion  250 mL Intravenous PRN Darrick Meigs, Marge Duncans, MD       0.9 %  sodium chloride infusion   Intravenous Continuous Oswald Hillock, MD 75 mL/hr at 05/13/22 0619 Infusion Verify at 05/13/22 0619   amLODipine (NORVASC) tablet 10 mg  10 mg Oral Daily Oswald Hillock, MD   10 mg at 05/13/22 5809   aspirin EC tablet 81 mg  81 mg Oral Daily Oswald Hillock, MD   81 mg at 05/13/22 0843   atorvastatin (LIPITOR) tablet 40 mg  40 mg Oral Daily Oswald Hillock, MD   40 mg at 05/13/22 0843   ceFEPIme (MAXIPIME) 2 g in sodium chloride 0.9 % 100 mL IVPB  2 g Intravenous QHS Oswald Hillock, MD   Stopped at  05/13/22 0152   Chlorhexidine Gluconate Cloth 2 % PADS 6 each  6 each Topical Q0600 Oswald Hillock, MD   6 each at 05/13/22 0109   clopidogrel (PLAVIX) tablet 75 mg  75 mg Oral Daily Oswald Hillock, MD   75 mg at 05/13/22 0842   heparin injection 5,000 Units  5,000 Units Subcutaneous Q8H Oswald Hillock, MD   5,000 Units at 05/13/22 0534   icosapent Ethyl (VASCEPA) 1 g capsule 2 g  2 g Oral BID Oswald Hillock, MD   2 g at 05/13/22 0854   insulin aspart (novoLOG) injection 0-15 Units  0-15 Units Subcutaneous TID WC Oswald Hillock, MD   2 Units at 05/13/22 0740   insulin glargine-yfgn (SEMGLEE) injection 40 Units  40 Units Subcutaneous QHS Oswald Hillock, MD   40 Units at 05/13/22 0124   pantoprazole (PROTONIX) injection 40 mg  40 mg  Intravenous Q24H Oswald Hillock, MD   40 mg at 05/13/22 0120   perflutren lipid microspheres (DEFINITY) IV suspension  1-10 mL Intravenous PRN Emokpae, Courage, MD       sodium chloride flush (NS) 0.9 % injection 3 mL  3 mL Intravenous Q12H Darrick Meigs, Gagan S, MD   3 mL at 05/13/22 0843   sodium chloride flush (NS) 0.9 % injection 3 mL  3 mL Intravenous PRN Oswald Hillock, MD       [START ON 05/14/2022] vancomycin (VANCOREADY) IVPB 1500 mg/300 mL  1,500 mg Intravenous Q48H Lama, Marge Duncans, MD       vitamin B-12 (CYANOCOBALAMIN) tablet 500 mcg  500 mcg Oral Daily Oswald Hillock, MD   500 mcg at 05/13/22 1224     Discharge Medications: Please see discharge summary for a list of discharge medications.  Relevant Imaging Results:  Relevant Lab Results:   Additional Information    Shade Flood, LCSW

## 2022-05-13 NOTE — Consult Note (Signed)
NAME:  Oscar Rose, MRN:  [id number], DOB:  09-28-1960, LOS: 1 ADMISSION DATE:  05/12/2022, CONSULTATION DATE:  05/13/22 REFERRING MD:  Denton Brick, CHIEF COMPLAINT:  Acute resp failure   History of Present Illness:   61 y.o. 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 He was hospitalized at [facility] with osteomyelitis of left foot admitted on 09/25/2021 and subsequently discharged to SNF on 10/05/2021. During that admission he had left-sided transmetatarsal amputation.  Patient is followed by [facility] wound care clinic - seen at wound care clinic on 05/02/2022.  He was seen by [facility] cardiology on 05/07/2022 for possible CHF and was ruled out of CHF.  As per note from cardiology his echocardiogram showed preserved ejection fraction.   He was brought to the ED 10/8 p  found in AM  with oxygen saturation of 30% on room air, he was placed on nonrebreather and EMS was called.  Patient denies any fever or chills.  Complains of nasal congestion and sinus pressure.  Note  not typically on home oxygen.   Initially patient was placed on BiPAP, which has been weaned off but still requiring HFNC 25 L/min.  SARS-CoV-2 RT-PCR, influenza A and influenza B by PCR all negative   Chest x-ray in the ED showed diffuse interstitial and heterogeneous pulmonary opacities with differential including infectious versus inflammatory versus pulmonary edema.  X-ray of left foot showed forefoot amputations with surrounding soft tissue swelling but no evidence of osteomyelitis.   Patient was started on vancomycin in the ED. lactic acid 1.9, repeat lactic acid 1.4.       Significant Hospital Events: Including procedures, antibiotic start and stop dates in addition to other pertinent events   BC x 2  10/8 >>> Resp viral Panel  10/8  neg  MRSA PCR  screen 10/9  neg  CT head and neckk 10/9 >>>    Scheduled Meds:  amLODipine  10 mg Oral Daily   aspirin EC  81 mg Oral Daily   atorvastatin  40 mg Oral Daily   Chlorhexidine Gluconate Cloth  6 each Topical Q0600   clopidogrel  75 mg Oral Daily   heparin  5,000 Units Subcutaneous Q8H   icosapent Ethyl  2 g Oral BID   insulin aspart  0-15 Units Subcutaneous TID WC   insulin glargine-yfgn  40 Units Subcutaneous QHS   pantoprazole (PROTONIX) IV  40 mg Intravenous Q24H   sodium chloride flush  3 mL Intravenous Q12H   cyanocobalamin  500 mcg Oral Daily   Continuous Infusions:  sodium chloride     ceFEPime (MAXIPIME) IV Stopped (05/13/22 0152)   [START ON 05/14/2022] vancomycin     PRN Meds:.sodium chloride, sodium chloride flush    Interim History / Subjective:  C/ nausea in setting of sensation of pnds and gerd x at least one week  Not aware of aspiration with meals   Objective   Blood pressure (!) 146/56, pulse 95, temperature 98.3 F (36.8 C), temperature source Oral, resp. rate (!) 22, height 5\' 11"  (1.803 m), weight 115.1 kg, SpO2 99 %.    FiO2 (%):  [100 %] 100 %   Intake/Output Summary (Last 24 hours) at 05/13/2022 1325 Last data filed  at 05/13/2022 2751 Gross per 24 hour  Intake 436.48 ml  Output 1400 ml  Net -963.52 ml   Filed Weights   05/12/22 0922 05/13/22 0500  Weight: 115.2 kg 115.1 kg    Examination: Tmax:  101.8  General appearance:    obese chronically ill wm nad at 45 degees   At Rest 02 sats  94-99% on HFN02 at 25lpm   No jvd Oropharynx clear,  mucosa nl Neck supple Lungs with a few scattered exp > insp rhonchi bilaterally RRR no s3 or or sign murmur Abd obese with limited insp  excursion  Extr warm with no edema or clubbing noted Neuro  Sensorium appears  sleepy but intact ,  no apparent motor deficits    I personally reviewed images and agree with radiology impression as follows:  CXR:   portable 05/12/22 Diffuse interstitial and heterogeneous pulmonary opacities. Differential considerations include infectious or inflammatory etiology or pulmonary edema.     Assessment & Plan:  1) Acute respiratory failure c/w ALI (ARDS like) in setting fever and suspected L foot infection s/p prior amputations with severe PVD /underlying DM >>> rx is supportive, keep fluids on dry side if bp/ bun/creat allow which will be a tough challenge here   2) CRI/ AKI   Lab Results  Component Value Date   CREATININE 3.98 (H) 05/13/2022   CREATININE 3.61 (H) 05/12/2022   CREATININE 2.20 (H) 11/08/2021   >>> rec CVP monitoring   3) Sepsis with high PCT > rx per [facility] for foot source appropriate  - ok with maxepime/ vanc  10//8 >>>  4)  Likely normocytic anemia of chronic dz   5) 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 Recent Labs  Lab 05/12/22 0927 05/12/22 1120 05/13/22 0126  PROCALCITON  --   --  24.85  WBC 15.6*  --  14.6*  LATICACIDVEN 1.9 1.4  --     Liver Function Tests: Recent Labs  Lab 05/12/22 0927  AST 40  ALT 19  ALKPHOS 65  BILITOT 1.4*  PROT 6.7  ALBUMIN 3.1*   No results for input(s): "LIPASE", "AMYLASE" in the last 168 hours. No results for input(s): "AMMONIA" in  the last 168 hours.  ABG    Component Value Date/Time   PHART 7.41 05/12/2022 1635   PCO2ART 36 05/12/2022 1635   PO2ART 56 (L) 05/12/2022 1635   HCO3 22.6 05/12/2022 1635   TCO2 27 11/08/2021 0728   ACIDBASEDEF 1.1 05/12/2022 1635   O2SAT 84.6 05/12/2022 1635     Coagulation Profile: Recent Labs  Lab 05/12/22 0927  INR 1.2    Cardiac Enzymes: No results for input(s): "CKTOTAL", "CKMB", "CKMBINDEX", "TROPONINI" in the last 168 hours.  HbA1C: Hgb A1c MFr Bld  Date/Time Value Ref Range Status  05/13/2022 01:27 AM 5.5 4.8 - 5.6 % Final    Comment:    (NOTE) Pre diabetes:          5.7%-6.4%  Diabetes:              >6.4%  Glycemic control for   <7.0% adults with diabetes   04/04/2016 04:10 PM 8.3 (H) 4.8 - 5.6 % Final    Comment:    (NOTE)         Pre-diabetes: 5.7 - 6.4         Diabetes: >6.4         Glycemic control for adults with diabetes: <7.0     CBG: Recent Labs  Lab 05/13/22 0811 05/13/22 1201  GLUCAP 144* 148*       Past Medical History:  He,  has a past medical history of Anemia, Arthritis, At risk for sleep apnea, CKD (chronic kidney disease), stage II, Depression, Diabetic neuropathy ([place]), GERD (gastroesophageal reflux disease), Headache, History of kidney stones, History of retinal detachment, Hyperlipidemia, Hypertension, Legal blindness of left eye, as defined in [place] Neuropathy, PVD (peripheral vascular disease) ([place]), Restless leg syndrome, Retained ureteral stent, Rotator cuff syndrome of right shoulder, Sleep apnea in adult, Stroke ([place]), Type 2 diabetes mellitus ([place]), and Vitamin D deficiency.   Surgical History:   Past Surgical History:  Procedure Laterality Date   ABDOMINAL AORTOGRAM W/LOWER EXTREMITY N/A 11/08/2021   Procedure: ABDOMINAL AORTOGRAM W/LOWER EXTREMITY;  Surgeon: Marty Heck, MD;  Location: [facility];  Service: Cardiovascular;  Laterality: N/A;   AMPUTATION Bilateral 2012   Left big toe partial and  right big toe complete   CARDIOVASCULAR STRESS TEST  04-05-2014  dr croitoru   low risk lexiscan nuclear study with mild diaphragmatic attenuation artifact/  no ischemia/  ef 58%   CATARACT EXTRACTION W/ INTRAOCULAR LENS  IMPLANT, BILATERAL  2013   CYSTO /  LEFT URETERAL STENT PLACEMENT  2012   CYSTOSCOPY W/ URETERAL STENT REMOVAL Left 07/07/2014   Procedure: CYSTO WITH LEFT PORTION STENT REMOVAL;  Surgeon: Malka So, MD;  Location: [facility];  Service: Urology;  Laterality: Left;   CYSTOSCOPY WITH URETEROSCOPY AND STENT PLACEMENT Left 07/07/2014   Procedure: CYSTOLITHALOPAXY URETEROSCOPY WITH STENT;  Surgeon: Malka So, MD;  Location: [facility];  Service: Urology;  Laterality: Left;   ENDARTERECTOMY Right 04/04/2016   Procedure: ENDARTERECTOMY CAROTID ARTERY RIGHT;  Surgeon: Angelia Mould, MD;  Location: [place];  Service: Vascular;  Laterality: Right;   HOLMIUM LASER APPLICATION N/A 36/01/4402   Procedure: HOLMIUM LASER APPLICATION;  Surgeon: Malka So, MD;  Location: [facility];  Service: Urology;  Laterality: N/A;   I & D  INFECTED SPIDER BITE UPPER BACK  06/ 2012   NEPHROLITHOTOMY Left 08/16/2014   Procedure: LEFT NEPHROLITHOTOMY PERCUTANEOUS;  Surgeon: Malka So, MD;  Location: [facility];  Service: Urology;  Laterality: Left;   NEPHROLITHOTOMY Left 08/18/2014   Procedure: LEFT NEPHROLITHOTOMY PERCUTANEOUS SECOND LOOK;  Surgeon: Malka So, MD;  Location: [facility];  Service: Urology;  Laterality: Left;   PATCH ANGIOPLASTY Right 04/04/2016   Procedure: PATCH ANGIOPLASTY RIGHT CAROTID ARTERY;  Surgeon: Angelia Mould, MD;  Location: [place];  Service: Vascular;  Laterality: Right;   PERIPHERAL VASCULAR BALLOON ANGIOPLASTY  11/08/2021   Procedure: PERIPHERAL VASCULAR BALLOON ANGIOPLASTY;  Surgeon: Marty Heck, MD;  Location:  [facility];  Service: Cardiovascular;;  Left AT   RETINAL DETACHMENT SURGERY Bilateral 2013    TONSILLECTOMY AND ADENOIDECTOMY  as child     Social History:   reports that he has never smoked. He has never used smokeless tobacco. He reports that he does not currently use alcohol. He reports that he does not use drugs.   Family History:  His family history includes Cancer in his mother. There is no history of Stroke.   Allergies No Known Allergies   Home Medications  Prior to Admission medications   Medication Sig Start Date End Date Taking? Authorizing Provider  Amino Acids-Protein Hydrolys (PRO-STAT PO) Take 30 mLs by mouth in the morning and at bedtime.   Yes [provider]  amLODipine (NORVASC) 10 MG tablet Take 10 mg by mouth daily.   Yes [provider]  aspirin EC 81 MG tablet Take 81 mg by mouth daily.   Yes [provider]  atorvastatin (LIPITOR) 40 MG tablet Take 40 mg by mouth daily.   Yes [provider]  Cholecalciferol 1.25 MG (50000 UT) TABS Take 1.25 mg by mouth once a week. Mondays   Yes [provider]  Cholecalciferol 100 MCG (4000 UT) TABS Take 1 tablet by mouth daily.   Yes [provider]  clopidogrel (PLAVIX) 75 MG tablet Take 1 tablet (75 mg total) by mouth daily. 11/08/21 11/08/22 Yes Marty Heck, MD  ferrous sulfate 325 (65 FE) MG tablet Take 325 mg by mouth daily with breakfast.   Yes [provider]  hydrALAZINE (APRESOLINE) 25 MG tablet Take 25 mg by mouth 3 (three) times daily.   Yes [provider]  icosapent Ethyl (VASCEPA) 1 g capsule Take 2 g by mouth 2 (two) times daily. 02/01/22  Yes [provider]  insulin glargine (LANTUS) 100 UNIT/ML injection Inject 40 Units into the skin at bedtime.   Yes [provider]  losartan (COZAAR) 50 MG tablet Take 50 mg by mouth daily. 03/28/22  Yes [provider]  Multiple Vitamin (MULTIVITAMIN) tablet Take 1 tablet by mouth daily.   Yes [provider]  Omega-3 1000 MG CAPS Take 1 capsule by mouth  every morning.   Yes [provider]  OXYGEN Inhale 4 L into the lungs as needed (hypoxia).   Yes [provider]  OYSTER SHELL CALCIUM PO Take 500 mg by mouth daily.   Yes [provider]  vitamin B-12 (CYANOCOBALAMIN) 500 MCG tablet Take 500 mcg by mouth daily.   Yes [provider]  vitamin C (ASCORBIC ACID) 500 MG tablet Take 1,000 mg by mouth 2 (two) times daily.   Yes [provider]  CIPROFLOXACIN IV Inject 200 mLs into the vein. Q 12 hours until 11/09/2021 Patient not taking: Reported on 05/12/2022    [provider]     The patient is critically ill with multiple organ systems failure and requires high complexity decision making for assessment and support, frequent evaluation and titration of therapies, application of advanced monitoring technologies and extensive interpretation of multiple databases. Critical Care Time devoted to patient care services described in this note is 45 minutes.   Christinia Gully, MD Pulmonary and [place] [phone]   After 7:00 pm call [place]ink  [phone]

## 2022-05-14 ENCOUNTER — Inpatient Hospital Stay (HOSPITAL_COMMUNITY): Payer: Medicare Other

## 2022-05-14 DIAGNOSIS — A419 Sepsis, unspecified organism: Secondary | ICD-10-CM | POA: Diagnosis not present

## 2022-05-14 DIAGNOSIS — J9601 Acute respiratory failure with hypoxia: Secondary | ICD-10-CM | POA: Diagnosis not present

## 2022-05-14 DIAGNOSIS — N179 Acute kidney failure, unspecified: Secondary | ICD-10-CM | POA: Diagnosis present

## 2022-05-14 DIAGNOSIS — J189 Pneumonia, unspecified organism: Secondary | ICD-10-CM | POA: Diagnosis not present

## 2022-05-14 DIAGNOSIS — N183 Chronic kidney disease, stage 3 unspecified: Secondary | ICD-10-CM | POA: Diagnosis present

## 2022-05-14 LAB — URINE CULTURE: Culture: NO GROWTH

## 2022-05-14 LAB — BASIC METABOLIC PANEL
Anion gap: 10 (ref 5–15)
BUN: 63 mg/dL — ABNORMAL HIGH (ref 6–20)
CO2: 22 mmol/L (ref 22–32)
Calcium: 8 mg/dL — ABNORMAL LOW (ref 8.9–10.3)
Chloride: 98 mmol/L (ref 98–111)
Creatinine, Ser: 3.26 mg/dL — ABNORMAL HIGH (ref 0.61–1.24)
GFR, Estimated: 21 mL/min — ABNORMAL LOW (ref >60)
Glucose, Bld: 140 mg/dL — ABNORMAL HIGH (ref 70–99)
Potassium: 3.7 mmol/L (ref 3.5–5.1)
Sodium: 130 mmol/L — ABNORMAL LOW (ref 135–145)

## 2022-05-14 LAB — CBC
HCT: 25 % — ABNORMAL LOW (ref 39.0–52.0)
Hemoglobin: 8.6 g/dL — ABNORMAL LOW (ref 13.0–17.0)
MCH: 30.1 pg (ref 26.0–34.0)
MCHC: 34.4 g/dL (ref 30.0–36.0)
MCV: 87.4 fL (ref 80.0–100.0)
Platelets: 197 10*3/uL (ref 150–400)
RBC: 2.86 MIL/uL — ABNORMAL LOW (ref 4.22–5.81)
RDW: 12.2 % (ref 11.5–15.5)
WBC: 14.5 10*3/uL — ABNORMAL HIGH (ref 4.0–10.5)
nRBC: 0 % (ref 0.0–0.2)

## 2022-05-14 LAB — CORTISOL: Cortisol, Plasma: 35.7 ug/dL

## 2022-05-14 LAB — GLUCOSE, CAPILLARY
Glucose-Capillary: 137 mg/dL — ABNORMAL HIGH (ref 70–99)
Glucose-Capillary: 125 mg/dL — ABNORMAL HIGH (ref 70–99)
Glucose-Capillary: 116 mg/dL — ABNORMAL HIGH (ref 70–99)
Glucose-Capillary: 152 mg/dL — ABNORMAL HIGH (ref 70–99)
Glucose-Capillary: 149 mg/dL — ABNORMAL HIGH (ref 70–99)

## 2022-05-14 LAB — AMYLASE: Amylase: 28 U/L (ref 28–100)

## 2022-05-14 LAB — LIPASE, BLOOD: Lipase: 25 U/L (ref 11–51)

## 2022-05-14 MED ORDER — TAMSULOSIN HCL 0.4 MG PO CAPS
0.4000 mg | ORAL_CAPSULE | Freq: Every day | ORAL | Status: DC
  Administered 2022-05-14 – 2022-05-17 (×3): 0.4 mg via ORAL
  Filled 2022-05-14 (×4): qty 1

## 2022-05-14 MED ORDER — TECHNETIUM TO 99M ALBUMIN AGGREGATED
3.8000 | Freq: Once | INTRAVENOUS | Status: AC | PRN
  Administered 2022-05-14: 3.8 via INTRAVENOUS

## 2022-05-14 MED ORDER — FUROSEMIDE 10 MG/ML IJ SOLN
40.0000 mg | Freq: Every day | INTRAMUSCULAR | Status: AC
  Administered 2022-05-14 – 2022-05-17 (×4): 40 mg via INTRAVENOUS
  Filled 2022-05-14 (×4): qty 4

## 2022-05-14 MED ORDER — ENOXAPARIN SODIUM 40 MG/0.4ML IJ SOSY
40.0000 mg | PREFILLED_SYRINGE | INTRAMUSCULAR | Status: DC
  Administered 2022-05-14 – 2022-05-17 (×4): 40 mg via SUBCUTANEOUS
  Filled 2022-05-14 (×4): qty 0.4

## 2022-05-14 NOTE — Progress Notes (Signed)
Foley cath dc'd at around 1430 today per Dr Joesph Fillers and [facility] placed on patient.  IV lasix given at 1628. Bladder scan done at 1753 due to patient had no urine output thus far with result of 665ml. Dr Joesph Fillers made aware. Patient had no complaints of abdominal discomfort or pain. In and out cath done per Dr Joesph Fillers with 779ml output collected. Dr Joesph Fillers made aware. PO flomax also given per orders.  Patient has been on non-rebreather since this morning due to having to go off unit to procedures.tests and then not wanting the heated high flow on anymore and preferring the non-rebreather when back in patient room. Patient at this time had also refused to try high flow nasal cannula. Patient O2 sat maintained on non-rebreather well. Dr Joesph Fillers aware of oxygen and non-rebreather. Report given to night RN to continue to monitor urine output.

## 2022-05-14 NOTE — Progress Notes (Signed)
PROGRESS NOTE   Oscar Rose, is a 61 y.o. male, DOB - [birth year], MKL:[id number]  Admit date - 05/12/2022   Admitting Physician Oswald Hillock, MD  Outpatient Primary MD for the patient is Dione Housekeeper, MD  LOS - 2  Chief Complaint  Patient presents with   Shortness of Breath        Brief Narrative:   61 y.o. 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, sleep apnea, gastroesophageal reflux disease and osteoarthritis admitted on 05/13/2019 with acute  hypoxic  respiratory failure secondary to multifocal pneumonia requiring BiPAP now and transition to high flow nasal cannula at 100% with 25 L    -Assessment and Plan: Problem  Sepsis due to multifocal pneumonia with acute hypoxic respiratory failure  Acute hypoxemic respiratory failure due to bilateral multifocal pneumonia  Acute hypoxic respiratory failure due to bilateral multifocal pneumonia  AKI (acute kidney injury) superimposed on CKD 3B  CKD (chronic kidney disease) stage 3B  Type 2 Diabetes Mellitus (Hcc)    1)Acute  hypoxic  respiratory failure due to bilateral multifocal pneumonia -Patient initially required BiPAP continuously -Weaned to high flow nasal cannula  -Patient now alternating between nonrebreather bag and high flow nasal cannula at 35 L 100% FiO2 -CT chest consistent with multifocal pneumonia -COVID and flu negative - continue bronchodilators and mucolytics -Pulmonary critical care consult appreciated -Renal function precludes CTA study -VQ scan Negative for PE -ABG was consistent with hypoxemia -Procalcitonin 24.85, ESR 85 -Continue IV vancomycin and cefepime for multifocal pneumonia pending blood  culture -Severity of pneumonia check Legionella antigen -A.m. cortisol is NOT Low serum cortisol at 3:30 AM was 35.7  2)Sepsis secondary to multifocal pneumonia--POA -ESR is 85, procalcitonin 24.85, WBC >> 14 -Antibiotics and bronchodilators as above #1  3)AKI----acute kidney injury on CKD stage - 3B   creatinine on admission = 3.61  baseline creatinine = around 2.2   ,  creatinine trending back down - renally adjust medications, avoid nephrotoxic agents / dehydration  / hypotension  4)DM2-A1c is 5.5 reflecting excellent diabetic control PTA -Continue Lantus insulin  Use Novolog/Humalog Sliding scale insulin with Accu-Cheks/Fingersticks as ordered   5)HTN----stable,  continue amlodipine  6)H/o PAD/prior Stroke--continue atorvastatin and Plavix for secondary prevention  7)GERD--continue Protonix  8) acute on chronic anemia--baseline usually around 12 -Suspect chronic anemia of CKD -ESA/Procrit -No bleeding concerns -Query hemodilution from sepsis fluid Rx  9)Lt Foot infection/post transmetatarsal amputation left foot--imaging studies without osteo---POA -Antibiotics as above #1 -Patient with underlying peripheral artery disease -Continue Lipitor and Plavix  10) urinary retention----having difficulty voiding after removing Foley on 05/14/2022 -Give Flomax 0.4 mg nightly  11)HFrEF--EF 45 to 50% as per echo from 05/13/2022, left ventricular hypokinesis including hypokinesis of the apical anterior wall inferior anterior inferior lateral and lateral walls noted -No aortic stenosis no mitral stenosis -BNP 643 -Chest x-ray and CT chest suggest pulmonary venous congestion/pulmonary edema -IV Lasix as ordered -Fluid intake and output charting  Disposition/Need for in-Hospital Stay- patient unable to be discharged at this time due to --acute hypoxic respiratory failure in the setting of sepsis sec to Multi-focal pneumonia requiring IV antibiotics and high flow oxygen  Status is:  Inpatient   Disposition: The patient is from: [facility]  Anticipated d/c is to: SNF              Anticipated d/c date is: > 3 days              Patient currently is not medically stable to d/c. Barriers: Not Clinically Stable-   Code Status :  -  Code Status: Full Code   Family Communication:    NA (patient is alert, awake and coherent)   DVT Prophylaxis  :   - SCDs  enoxaparin (LOVENOX) injection 40 mg Start: 05/14/22 2100   Lab Results  Component Value Date   PLT 197 05/14/2022    Inpatient Medications  Scheduled Meds:  amLODipine  10 mg Oral Daily   aspirin EC  81 mg Oral Daily   atorvastatin  40 mg Oral Daily   Chlorhexidine Gluconate Cloth  6 each Topical Q0600   clopidogrel  75 mg Oral Daily   dextromethorphan-guaiFENesin  1 tablet Oral BID   enoxaparin (LOVENOX) injection  40 mg Subcutaneous Q24H   furosemide  40 mg Intravenous Daily   icosapent Ethyl  2 g Oral BID   insulin aspart  0-15 Units Subcutaneous TID WC   insulin glargine-yfgn  40 Units Subcutaneous QHS   pantoprazole (PROTONIX) IV  40 mg Intravenous Q24H   sodium chloride flush  3 mL Intravenous Q12H   tamsulosin  0.4 mg Oral QPC supper   cyanocobalamin  500 mcg Oral Daily   Continuous Infusions:  sodium chloride     ceFEPime (MAXIPIME) IV Stopped (05/13/22 2046)   vancomycin Stopped (05/14/22 1213)   PRN Meds:.sodium chloride, albuterol, ipratropium-albuterol, sodium chloride flush   Anti-infectives (From admission, onward)    Start     Dose/Rate Route Frequency Ordered Stop   05/14/22 1000  vancomycin (VANCOREADY) IVPB 1500 mg/300 mL        1,500 mg 150 mL/hr over 120 Minutes Intravenous Every 48 hours 05/12/22 1026     05/13/22 0100  ceFEPIme (MAXIPIME) 2 g in sodium chloride 0.9 % 100 mL IVPB       Note to Pharmacy: Cefepime 2 g IV q24h for CrCl < 30 mL/min   2 g 200 mL/hr over 30 Minutes Intravenous Daily at bedtime 05/13/22 0041     05/12/22 1000  vancomycin  (VANCOREADY) IVPB 2000 mg/400 mL        2,000 mg 200 mL/hr over 120 Minutes Intravenous  Once 05/12/22 0950 05/12/22 1230       Subjective: Oscar Rose today has no fevers, no emesis,  No chest pain,   - Cough and hypoxia persist -Alternating between high flow nasal cannula and nonrebreather bag -Dyspnea persist -Unable to void after removing Foley  Objective: Vitals:   05/14/22 1500 05/14/22 1528 05/14/22 1600 05/14/22 1604  BP: (!) 173/75 (!) 155/55 (!) 156/58   Pulse: 96 93 96   Resp:  20 19   Temp:    98.4 F (36.9 C)  TempSrc:    Axillary  SpO2: 94% 92% 93%   Weight:      Height:        Intake/Output Summary (Last 24 hours) at 05/14/2022 1802 Last data filed at 05/14/2022 1636 Gross per 24 hour  Intake 403 ml  Output 1850 ml  Net -1447 ml   Filed Weights   05/12/22 0922 05/13/22 0500 05/14/22 0315  Weight: 115.2 kg 115.1 kg 115.7 kg    Physical Exam  Gen:- Awake Alert, no acute distress HEENT:- [place].AT, No sclera icterus  Neck-Supple Neck,No JVD,.  Lungs-  CTAB , fair symmetrical air movement CV- S1, S2 normal, regular  Abd-  +ve B.Sounds, Abd Soft, No tenderness,    Psych-affect is appropriate, oriented x3 Neuro-no new focal deficits, no tremors MSK-left foot with transmetatarsal amputation, right foot with prior big toe amputation  Data Reviewed: I have personally reviewed following labs and imaging studies  CBC: Recent Labs  Lab 05/12/22 0927 05/13/22 0126 05/14/22 0324  WBC 15.6* 14.6* 14.5*  NEUTROABS 14.7*  --   --   HGB 10.0* 9.2* 8.6*  HCT 29.0* 26.3* 25.0*  MCV 87.6 86.2 87.4  PLT 217 203 712   Basic Metabolic Panel: Recent Labs  Lab 05/12/22 0927 05/13/22 0126 05/14/22 0324  NA 127*  --  130*  K 4.3  --  3.7  CL 93*  --  98  CO2 21*  --  22  GLUCOSE 230*  --  140*  BUN 49*  --  63*  CREATININE 3.61* 3.98* 3.26*  CALCIUM 7.9*  --  8.0*   GFR: Estimated Creatinine Clearance: 31.6 mL/min (A) (by C-G formula based on SCr of  3.26 mg/dL (H)). Liver Function Tests: Recent Labs  Lab 05/12/22 0927  AST 40  ALT 19  ALKPHOS 65  BILITOT 1.4*  PROT 6.7  ALBUMIN 3.1*   HbA1C: Recent Labs    05/13/22 0127  HGBA1C 5.5    Recent Results (from the past 240 hour(s))  Urine Culture     Status: None   Collection Time: 05/12/22  9:27 AM   Specimen: In/Out Cath Urine  Result Value Ref Range Status   Specimen Description   Final    IN/OUT CATH URINE Performed at [facility], [address] [place], [place] [address]    Special Requests   Final    NONE Performed at [facility], [address], [place], [place] [address]    Culture   Final    NO GROWTH Performed at [facility], [place] [address] [place], [place] [address]    Report Status 05/14/2022 FINAL  Final  Blood Culture (routine x 2)     Status: None (Preliminary result)   Collection Time: 05/12/22  9:30 AM   Specimen: BLOOD LEFT FOREARM  Result Value Ref Range Status   Specimen Description BLOOD LEFT FOREARM  Final   Special Requests NONE  Final   Culture   Final    NO GROWTH 2 DAYS Performed at [facility], [address], [place], [place] [address]    Report Status PENDING  Incomplete  Resp Panel by RT-PCR (Flu A&B, Covid) Anterior Nasal Swab     Status: None   Collection Time: 05/12/22  9:37 AM   Specimen: Anterior Nasal Swab  Result Value Ref Range Status   SARS Coronavirus 2 by RT PCR NEGATIVE NEGATIVE Final    Comment: (NOTE) SARS-CoV-2 target nucleic acids are NOT DETECTED.  The SARS-CoV-2 RNA is generally detectable in upper respiratory specimens during the acute phase of infection. The lowest concentration of SARS-CoV-2 viral copies this assay can detect is 138 copies/mL. A negative result does not preclude SARS-Cov-2 infection and should not be used as the sole basis for treatment or other patient management decisions. A negative result may occur with  improper specimen collection/handling, submission of specimen  other than nasopharyngeal swab, presence of viral mutation(s) within the areas targeted by this assay, and inadequate number of viral copies(<138 copies/mL). A negative result must be combined with clinical  observations, patient history, and epidemiological information. The expected result is Negative.  Fact Sheet for Patients:  EntrepreneurPulse.com.au  Fact Sheet for Healthcare Providers:  IncredibleEmployment.be  This test is no t yet approved or cleared by the [place] FDA and  has been authorized for detection and/or diagnosis of SARS-CoV-2 by FDA under an Emergency Use Authorization (EUA). This EUA will remain  in effect (meaning this test can be used) for the duration of the COVID-19 declaration under Section 564(b)(1) of the Act, 21 U.S.C.section 360bbb-3(b)(1), unless the authorization is terminated  or revoked sooner.       Influenza A by PCR NEGATIVE NEGATIVE Final   Influenza B by PCR NEGATIVE NEGATIVE Final    Comment: (NOTE) The Xpert Xpress SARS-CoV-2/FLU/RSV plus assay is intended as an aid in the diagnosis of influenza from Nasopharyngeal swab specimens and should not be used as a sole basis for treatment. Nasal washings and aspirates are unacceptable for Xpert Xpress SARS-CoV-2/FLU/RSV testing.  Fact Sheet for Patients: EntrepreneurPulse.com.au  Fact Sheet for Healthcare Providers: IncredibleEmployment.be  This test is not yet approved or cleared by the [place] FDA and has been authorized for detection and/or diagnosis of SARS-CoV-2 by FDA under an Emergency Use Authorization (EUA). This EUA will remain in effect (meaning this test can be used) for the duration of the COVID-19 declaration under Section 564(b)(1) of the Act, 21 U.S.C. section 360bbb-3(b)(1), unless the authorization is terminated or revoked.  Performed at [facility], [address] [place], [place]  [address]   Blood Culture (routine x 2)     Status: None (Preliminary result)   Collection Time: 05/12/22 10:33 AM   Specimen: BLOOD  Result Value Ref Range Status   Specimen Description   Final    BLOOD LEFT ANTECUBITAL Performed at [facility], [place] [address] [place], [place] [address]    Special Requests   Final    BOTTLES DRAWN AEROBIC AND ANAEROBIC Blood Culture adequate volume Performed at [facility], [place] [address] [place], [place] [address]    Culture   Final    NO GROWTH 2 DAYS Performed at [facility], [address] [place], [place] [address]    Report Status PENDING  Incomplete  MRSA Next Gen by PCR, Nasal     Status: None   Collection Time: 05/13/22  9:53 AM   Specimen: Nasal Mucosa; Nasal Swab  Result Value Ref Range Status   MRSA by PCR Next Gen NOT DETECTED NOT DETECTED Final    Comment: (NOTE) The GeneXpert MRSA Assay (FDA approved for NASAL specimens only), is one component of a comprehensive MRSA colonization surveillance program. It is not intended to diagnose MRSA infection nor to guide or monitor treatment for MRSA infections. Test performance is not FDA approved in patients less than 18 years old. Performed at [facility], [address], [place], [place] [address]       Radiology Studies: CT CHEST WO CONTRAST  Result Date: 05/14/2022 CLINICAL DATA:  Chronic dyspnea, hypoxia EXAM: CT CHEST WITHOUT CONTRAST TECHNIQUE: Multidetector CT imaging of the chest was performed following the standard protocol without IV contrast. RADIATION DOSE REDUCTION: This exam was performed according to the departmental dose-optimization program which includes automated exposure control, adjustment of the mA and/or kV according to patient size and/or use of iterative reconstruction technique. COMPARISON:  Previous studies including chest radiographs done on 05/14/2022 FINDINGS: Cardiovascular: Coronary artery calcifications  are seen. Small pericardial effusion is present. There  is ectasia of main pulmonary artery measuring 3.9 cm suggesting pulmonary arterial hypertension. Mediastinum/Nodes: There are slightly enlarged lymph nodes in the mediastinum each measuring less than 10 mm in short axis. Lungs/Pleura: Small to moderate bilateral pleural effusions are seen, more so on the right side. There extensive patchy alveolar infiltrates in both lungs, more so on the left side. There are faint ground-glass densities in the mid and lower lung fields. There is no pneumothorax. Upper Abdomen: Spleen is enlarged measuring up to 15.8 cm in diameter. There is subtle increased density in the dependent portion of gallbladder. There is no wall thickening in gallbladder. Musculoskeletal: No acute findings are seen. IMPRESSION: Extensive patchy alveolar and ground-glass infiltrates are seen in both lungs, more so on the left side suggesting multifocal pneumonia. Part of this finding may suggest underlying pulmonary edema. Small to moderate bilateral pleural effusions are seen. Coronary artery disease. There is ectasia of main pulmonary artery suggesting pulmonary arterial hypertension. Small pericardial effusion. There is subtle increased density in the lumen of gallbladder suggesting presence of sludge and possibly tiny stones. Enlarged spleen. Electronically Signed   By: Elmer Picker M.D.   On: 05/14/2022 12:33   DG Chest 2 View  Result Date: 05/14/2022 CLINICAL DATA:  Dyspnea, low oxygen saturation EXAM: CHEST - 2 VIEW COMPARISON:  05/12/2022 FINDINGS: Upper normal size cardiac silhouette. Mediastinal contours normal. Patchy airspace infiltrates bilaterally question multifocal pneumonia less likely asymmetric edema. No pleural effusion or pneumothorax. IMPRESSION: Patchy BILATERAL pulmonary infiltrates favoring multifocal pneumonia over pulmonary edema. Electronically Signed   By: Lavonia Dana M.D.   On: 05/14/2022 12:16   NM Pulmonary  Perfusion  Result Date: 05/14/2022 CLINICAL DATA:  Dyspnea, shortness of breath and chest pain EXAM: NUCLEAR MEDICINE PERFUSION LUNG SCAN TECHNIQUE: Perfusion images were obtained in multiple projections after intravenous injection of radiopharmaceutical. Ventilation scans intentionally deferred if perfusion scan and chest x-ray adequate for interpretation during COVID 19 epidemic. RADIOPHARMACEUTICALS:  3.8 mCi Tc-69m MAA IV COMPARISON:  None Radiographic correlation: Chest radiographs 05/14/2022 FINDINGS: Normal perfusion lung scan. No perfusion defects. IMPRESSION: Normal perfusion lung scan. Electronically Signed   By: Lavonia Dana M.D.   On: 05/14/2022 12:13   ECHOCARDIOGRAM COMPLETE  Result Date: 05/13/2022    ECHOCARDIOGRAM REPORT   Patient Name:   Oscar Rose Date of Exam: 05/13/2022 Medical Rec #:  [id number]      Height:       71.0 in Accession #:    [id number]     Weight:       253.7 lb Date of Birth:  1960-10-11       BSA:          2.333 m Patient Age:    73 years       BP:           141/52 mmHg Patient Gender: M              HR:           99 bpm. Exam Location:  [facility] Procedure: 2D Echo, Cardiac Doppler, Color Doppler and Intracardiac            Opacification Agent Indications:    I50.40* Unspecified combined systolic (congestive) and diastolic                 (congestive) heart failure  History:        Patient has prior history of Echocardiogram examinations, most  recent 02/15/2016. Stroke, Signs/Symptoms:Chest Pain and Dyspnea;                 Risk Factors:Hypertension and Diabetes.  Sonographer:    Roseanna Rainbow RDCS Referring Phys: Earnie Larsson LAMA  Sonographer Comments: Technically difficult study due to poor echo windows. Image acquisition challenging due to patient body habitus. IMPRESSIONS  1. Left ventricular ejection fraction, by estimation, is 45 to 50%. The left ventricle has mildly decreased function. The left ventricle demonstrates regional wall motion abnormalities  (see scoring diagram/findings for description). There is mild concentric left ventricular hypertrophy. Left ventricular diastolic parameters are indeterminate. Elevated left ventricular end-diastolic pressure. There is hypokinesis of the left ventricular, apical anterior wall, inferior wall, lateral wall, inferolateral wall and apical segment.  2. Right ventricular systolic function is normal. The right ventricular size is normal. Tricuspid regurgitation signal is inadequate for assessing PA pressure.  3. The mitral valve is normal in structure. Trivial mitral valve regurgitation. No evidence of mitral stenosis.  4. The aortic valve is tricuspid. Aortic valve regurgitation is not visualized. Aortic valve sclerosis/calcification is present, without any evidence of aortic stenosis.  5. There is mild dilatation of the aortic root, measuring 41 mm. There is mild dilatation of the ascending aorta, measuring 39 mm.  6. The inferior vena cava is normal in size with greater than 50% respiratory variability, suggesting right atrial pressure of 3 mmHg. FINDINGS  Left Ventricle: Left ventricular ejection fraction, by estimation, is 45 to 50%. The left ventricle has mildly decreased function. The left ventricle demonstrates regional wall motion abnormalities. Definity contrast agent was given IV to delineate the left ventricular endocardial borders. The left ventricular internal cavity size was normal in size. There is mild concentric left ventricular hypertrophy. Left ventricular diastolic parameters are indeterminate. Elevated left ventricular end-diastolic pressure. Right Ventricle: The right ventricular size is normal. No increase in right ventricular wall thickness. Right ventricular systolic function is normal. Tricuspid regurgitation signal is inadequate for assessing PA pressure. Left Atrium: Left atrial size was normal in size. Right Atrium: Right atrial size was normal in size. Pericardium: There is no evidence of  pericardial effusion. Mitral Valve: The mitral valve is normal in structure. Trivial mitral valve regurgitation. No evidence of mitral valve stenosis. Tricuspid Valve: The tricuspid valve is normal in structure. Tricuspid valve regurgitation is trivial. No evidence of tricuspid stenosis. Aortic Valve: The aortic valve is tricuspid. Aortic valve regurgitation is not visualized. Aortic valve sclerosis/calcification is present, without any evidence of aortic stenosis. Pulmonic Valve: The pulmonic valve was normal in structure. Pulmonic valve regurgitation is not visualized. No evidence of pulmonic stenosis. Aorta: The aortic root is normal in size and structure. There is mild dilatation of the aortic root, measuring 41 mm. There is mild dilatation of the ascending aorta, measuring 39 mm. Venous: The inferior vena cava is normal in size with greater than 50% respiratory variability, suggesting right atrial pressure of 3 mmHg. IAS/Shunts: There is redundancy of the interatrial septum. No atrial level shunt detected by color flow Doppler.  LEFT VENTRICLE PLAX 2D LVIDd:         5.10 cm      Diastology LVIDs:         3.60 cm      LV e' medial:    3.05 cm/s LV PW:         1.20 cm      LV E/e' medial:  32.4 LV IVS:  1.30 cm      LV e' lateral:   9.46 cm/s LVOT diam:     2.50 cm      LV E/e' lateral: 10.5 LV SV:         94 LV SV Index:   40 LVOT Area:     4.91 cm  LV Volumes (MOD) LV vol d, MOD A2C: 126.0 ml LV vol d, MOD A4C: 117.0 ml LV vol s, MOD A2C: 64.3 ml LV vol s, MOD A4C: 68.2 ml LV SV MOD A2C:     61.7 ml LV SV MOD A4C:     117.0 ml LV SV MOD BP:      62.6 ml RIGHT VENTRICLE             IVC RV S prime:     10.10 cm/s  IVC diam: 2.30 cm TAPSE (M-mode): 1.9 cm LEFT ATRIUM             Index        RIGHT ATRIUM           Index LA diam:        3.90 cm 1.67 cm/m   RA Area:     17.80 cm LA Vol (A2C):   53.9 ml 23.10 ml/m  RA Volume:   48.80 ml  20.92 ml/m LA Vol (A4C):   70.5 ml 30.22 ml/m LA Biplane Vol: 60.0  ml 25.72 ml/m  AORTIC VALVE LVOT Vmax:   110.00 cm/s LVOT Vmean:  74.800 cm/s LVOT VTI:    0.191 m  AORTA Ao Root diam: 4.10 cm Ao Asc diam:  3.90 cm MITRAL VALVE MV Area (PHT): 4.09 cm    SHUNTS MV Decel Time: 186 msec    Systemic VTI:  0.19 m MV E velocity: 98.87 cm/s  Systemic Diam: 2.50 cm MV A velocity: 94.10 cm/s MV E/A ratio:  1.05 Fransico Him MD Electronically signed by Fransico Him MD Signature Date/Time: 05/13/2022/2:56:10 PM    Final      Scheduled Meds:  amLODipine  10 mg Oral Daily   aspirin EC  81 mg Oral Daily   atorvastatin  40 mg Oral Daily   Chlorhexidine Gluconate Cloth  6 each Topical Q0600   clopidogrel  75 mg Oral Daily   dextromethorphan-guaiFENesin  1 tablet Oral BID   enoxaparin (LOVENOX) injection  40 mg Subcutaneous Q24H   furosemide  40 mg Intravenous Daily   icosapent Ethyl  2 g Oral BID   insulin aspart  0-15 Units Subcutaneous TID WC   insulin glargine-yfgn  40 Units Subcutaneous QHS   pantoprazole (PROTONIX) IV  40 mg Intravenous Q24H   sodium chloride flush  3 mL Intravenous Q12H   tamsulosin  0.4 mg Oral QPC supper   cyanocobalamin  500 mcg Oral Daily   Continuous Infusions:  sodium chloride     ceFEPime (MAXIPIME) IV Stopped (05/13/22 2046)   vancomycin Stopped (05/14/22 1213)     LOS: 2 days    Roxan Hockey M.D on 05/14/2022 at 6:02 PM  Go to [email or web address] - for contact info  [facility] - Office  [phone]  If 7PM-7AM, please contact night-coverage [email or web address] 05/14/2022, 6:02 PM

## 2022-05-14 NOTE — Progress Notes (Addendum)
NAME:  Oscar Rose, MRN:  [id number], DOB:  05-28-1961, LOS: 2 ADMISSION DATE:  05/12/2022, CONSULTATION DATE:  05/13/22 REFERRING MD:  Oscar Rose, CHIEF COMPLAINT:  Acute resp failure   History of Present Illness:   61 y.o. 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 He was hospitalized at [facility] with osteomyelitis of left foot admitted on 09/25/2021 and subsequently discharged to SNF on 10/05/2021. During that admission he had left-sided transmetatarsal amputation.  Patient is followed by [facility] wound care clinic - seen at wound care clinic on 05/02/2022.  He was seen by [facility] cardiology on 05/07/2022 for possible CHF and was ruled out of CHF.  As per note from cardiology his echocardiogram showed preserved ejection fraction.   He was brought to the ED 10/8 p  found in AM  with oxygen saturation of 30% on room air, he was placed on nonrebreather and EMS was called.  Patient denies any fever or chills.  Complains of nasal congestion and sinus pressure.  Note  not typically on home oxygen.   Initially patient was placed on BiPAP, which has been weaned off but still requiring HFNC 25 L/min.  SARS-CoV-2 RT-PCR, influenza A and influenza B by PCR all negative   Chest x-ray in the ED showed diffuse interstitial and heterogeneous pulmonary opacities with differential including infectious versus inflammatory versus pulmonary edema.  X-ray of left foot showed forefoot amputations with surrounding soft tissue swelling but no evidence of osteomyelitis.   Patient was started on vancomycin in the ED. lactic acid 1.9, repeat lactic acid 1.4.       Significant Hospital Events: Including procedures, antibiotic start and stop dates in addition to other pertinent events   BC x 2  10/8 >>> Resp viral Panel  10/8  neg  MRSA PCR  screen 10/9  neg   CT chest w/o contrast 10/10 1. Left ventricular ejection fraction, by estimation, is 45 to 50%. The  left ventricle has mildly decreased function. The left ventricle  demonstrates regional wall motion abnormalities (see scoring  diagram/findings for description). There is mild  concentric left ventricular hypertrophy. Left ventricular diastolic  parameters are indeterminate. Elevated left ventricular end-diastolic  pressure. There is hypokinesis of the left ventricular, apical anterior  wall, inferior wall, lateral wall,  inferolateral wall and apical segment.   2. Right ventricular systolic function is normal. The right ventricular  size is normal. Tricuspid regurgitation signal is inadequate for assessing  PA pressure.   3. The mitral valve is normal in structure. Trivial mitral valve  regurgitation. No evidence of mitral stenosis.   4. The aortic valve is tricuspid. Aortic valve regurgitation is not  visualized. Aortic valve sclerosis/calcification is present, without any  evidence of aortic stenosis.   5. There is mild dilatation of the aortic root, measuring 41 mm. There is  mild dilatation of the ascending aorta, measuring 39 mm.   6. The inferior vena cava is normal in size with greater than 50%  respiratory variability, suggesting right atrial pressure of 3 mmHg    Scheduled Meds:  amLODipine  10 mg Oral Daily   aspirin EC  81 mg Oral Daily   atorvastatin  40 mg Oral Daily  Chlorhexidine Gluconate Cloth  6 each Topical Q0600   clopidogrel  75 mg Oral Daily   dextromethorphan-guaiFENesin  1 tablet Oral BID   enoxaparin (LOVENOX) injection  40 mg Subcutaneous Q24H   furosemide  40 mg Intravenous Daily   icosapent Ethyl  2 g Oral BID   insulin aspart  0-15 Units  Subcutaneous TID WC   insulin glargine-yfgn  40 Units Subcutaneous QHS   pantoprazole (PROTONIX) IV  40 mg Intravenous Q24H   sodium chloride flush  3 mL Intravenous Q12H   cyanocobalamin  500 mcg Oral Daily   Continuous Infusions:  sodium chloride     ceFEPime (MAXIPIME) IV 2 g (05/13/22 2016)   vancomycin 1,500 mg (05/14/22 1012)   PRN Meds:.sodium chloride, albuterol, ipratropium-albuterol, sodium chloride flush    Interim History / Subjective:  Less nauseated, no pain, min congested sounding cough   Objective   Blood pressure (!) 160/60, pulse 90, temperature 98.2 F (36.8 C), temperature source Oral, resp. rate 17, height 5\' 11"  (1.803 m), weight 115.7 kg, SpO2 96 %.    FiO2 (%):  [90 %-100 %] 100 %   Intake/Output Summary (Last 24 hours) at 05/14/2022 1503 Last data filed at 05/14/2022 1400 Gross per 24 hour  Intake 363 ml  Output 1850 ml  Net -1487 ml   Filed Weights   05/12/22 0922 05/13/22 0500 05/14/22 0315  Weight: 115.2 kg 115.1 kg 115.7 kg    Examination: Tmax:  98.5 General appearance:    acute and chronically ill appearing obese wm lying supine at 30 degrees hob  At Rest 02 sats  96% on 30 lpm HFN02   No jvd Oropharynx clear,  mucosa nl Neck supple Lungs with a few scattered insp crackles bilaterally RRR no s3 or or sign murmur Abd obese with limited  excursion / no guarding or rebound Extr warm with no edema or clubbing noted Neuro  Sensorium appears intact,  no apparent motor deficits      I personally reviewed images and agree with radiology impression as follows:   Chest CT w/o contrast  10/10 Small to moderate bilateral pleural effusions are seen, more so on the right side. There extensive patchy alveolar infiltrates in both lungs, more so on the left side. There are faint ground-glass densities in the mid and lower lung fields. There is no pneumothorax.    Assessment & Plan:  1) Acute respiratory failure c/w ALI (ARDS like) in setting  fever and suspected L foot infection s/p prior amputations with severe PVD /underlying DM >>> rx is supportive, keep fluids on dry side if bp/ bun/creat allow which will be a tough challenge here   2) CRI/ AKI   Lab Results  Component Value Date   CREATININE 3.26 (H) 05/14/2022   CREATININE 3.98 (H) 05/13/2022   CREATININE 3.61 (H) 05/12/2022   >>> diuresis as tol   3) Sepsis with high PCT > rx per [facility] for foot source appropriate  - ok with maxepime/ vanc  10//8 >>>  4)  Likely normocytic anemia of chronic dz   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    f [facility] (right click and "Reselect all SmartList Selections" daily)    Per [facility]  Labs   CBC: Recent Labs  Lab 05/12/22 0927 05/13/22 0126 05/14/22 0324  WBC 15.6* 14.6* 14.5*  NEUTROABS 14.7*  --   --   HGB 10.0* 9.2* 8.6*  HCT 29.0* 26.3* 25.0*  MCV 87.6 86.2 87.4  PLT 217  203 263    Basic Metabolic Panel: Recent Labs  Lab 05/12/22 0927 05/13/22 0126 05/14/22 0324  NA 127*  --  130*  K 4.3  --  3.7  CL 93*  --  98  CO2 21*  --  22  GLUCOSE 230*  --  140*  BUN 49*  --  63*  CREATININE 3.61* 3.98* 3.26*  CALCIUM 7.9*  --  8.0*   GFR: Estimated Creatinine Clearance: 31.6 mL/min (A) (by C-G formula based on SCr of 3.26 mg/dL (H)). Recent Labs  Lab 05/12/22 0927 05/12/22 1120 05/13/22 0126 05/14/22 0324  PROCALCITON  --   --  24.85  --   WBC 15.6*  --  14.6* 14.5*  LATICACIDVEN 1.9 1.4  --   --     Liver Function Tests: Recent Labs  Lab 05/12/22 0927  AST 40  ALT 19  ALKPHOS 65  BILITOT 1.4*  PROT 6.7  ALBUMIN 3.1*   Recent Labs  Lab 05/14/22 0324  LIPASE 25  AMYLASE 28   No results for input(s): "AMMONIA" in the last 168 hours.  ABG    Component Value Date/Time   PHART 7.41 05/12/2022 1635   PCO2ART 36 05/12/2022 1635   PO2ART 56 (L) 05/12/2022 1635   HCO3 22.6 05/12/2022 1635   TCO2 27 11/08/2021 0728   ACIDBASEDEF 1.1 05/12/2022 1635   O2SAT 84.6 05/12/2022 1635      Coagulation Profile: Recent Labs  Lab 05/12/22 0927  INR 1.2    Cardiac Enzymes: No results for input(s): "CKTOTAL", "CKMB", "CKMBINDEX", "TROPONINI" in the last 168 hours.  HbA1C: Hgb A1c MFr Bld  Date/Time Value Ref Range Status  05/13/2022 01:27 AM 5.5 4.8 - 5.6 % Final    Comment:    (NOTE) Pre diabetes:          5.7%-6.4%  Diabetes:              >6.4%  Glycemic control for   <7.0% adults with diabetes   04/04/2016 04:10 PM 8.3 (H) 4.8 - 5.6 % Final    Comment:    (NOTE)         Pre-diabetes: 5.7 - 6.4         Diabetes: >6.4         Glycemic control for adults with diabetes: <7.0     CBG: Recent Labs  Lab 05/13/22 1650 05/13/22 1909 05/14/22 0314 05/14/22 0723 05/14/22 1210  GLUCAP 160* 145* 137* 125* 116*    The patient is critically ill with multiple organ systems failure and requires high complexity decision making for assessment and support, frequent evaluation and titration of therapies, application of advanced monitoring technologies and extensive interpretation of multiple databases. Critical Care Time devoted to patient care services described in this note is 35 minutes.   Christinia Gully, MD Pulmonary and [place] [phone]   After 7:00 pm call Elink  [phone]

## 2022-05-15 DIAGNOSIS — A419 Sepsis, unspecified organism: Secondary | ICD-10-CM | POA: Diagnosis not present

## 2022-05-15 DIAGNOSIS — J189 Pneumonia, unspecified organism: Secondary | ICD-10-CM | POA: Diagnosis not present

## 2022-05-15 LAB — CBC
HCT: 25.4 % — ABNORMAL LOW (ref 39.0–52.0)
Hemoglobin: 8.7 g/dL — ABNORMAL LOW (ref 13.0–17.0)
MCH: 29.9 pg (ref 26.0–34.0)
MCHC: 34.3 g/dL (ref 30.0–36.0)
MCV: 87.3 fL (ref 80.0–100.0)
Platelets: 229 10*3/uL (ref 150–400)
RBC: 2.91 MIL/uL — ABNORMAL LOW (ref 4.22–5.81)
RDW: 12.2 % (ref 11.5–15.5)
WBC: 13.4 10*3/uL — ABNORMAL HIGH (ref 4.0–10.5)
nRBC: 0 % (ref 0.0–0.2)

## 2022-05-15 LAB — RENAL FUNCTION PANEL
Albumin: 2.6 g/dL — ABNORMAL LOW (ref 3.5–5.0)
Anion gap: 10 (ref 5–15)
BUN: 62 mg/dL — ABNORMAL HIGH (ref 6–20)
CO2: 23 mmol/L (ref 22–32)
Calcium: 8.3 mg/dL — ABNORMAL LOW (ref 8.9–10.3)
Chloride: 100 mmol/L (ref 98–111)
Creatinine, Ser: 2.83 mg/dL — ABNORMAL HIGH (ref 0.61–1.24)
GFR, Estimated: 25 mL/min — ABNORMAL LOW (ref >60)
Glucose, Bld: 133 mg/dL — ABNORMAL HIGH (ref 70–99)
Phosphorus: 3.2 mg/dL (ref 2.5–4.6)
Potassium: 3.5 mmol/L (ref 3.5–5.1)
Sodium: 133 mmol/L — ABNORMAL LOW (ref 135–145)

## 2022-05-15 LAB — GLUCOSE, CAPILLARY
Glucose-Capillary: 138 mg/dL — ABNORMAL HIGH (ref 70–99)
Glucose-Capillary: 103 mg/dL — ABNORMAL HIGH (ref 70–99)
Glucose-Capillary: 105 mg/dL — ABNORMAL HIGH (ref 70–99)
Glucose-Capillary: 142 mg/dL — ABNORMAL HIGH (ref 70–99)

## 2022-05-15 MED ORDER — POTASSIUM CHLORIDE CRYS ER 20 MEQ PO TBCR
20.0000 meq | EXTENDED_RELEASE_TABLET | Freq: Once | ORAL | Status: AC
  Administered 2022-05-15: 20 meq via ORAL
  Filled 2022-05-15: qty 1

## 2022-05-15 MED ORDER — LABETALOL HCL 5 MG/ML IV SOLN
10.0000 mg | INTRAVENOUS | Status: DC | PRN
  Administered 2022-05-15 – 2022-05-16 (×4): 10 mg via INTRAVENOUS
  Filled 2022-05-15 (×4): qty 4

## 2022-05-15 MED ORDER — VANCOMYCIN HCL 1750 MG/350ML IV SOLN: 1750.0000 mg | INTRAVENOUS | Status: DC

## 2022-05-15 MED ORDER — SODIUM CHLORIDE 0.9 % IV SOLN
2.0000 g | Freq: Two times a day (BID) | INTRAVENOUS | Status: DC
  Administered 2022-05-15 – 2022-05-18 (×7): 2 g via INTRAVENOUS
  Filled 2022-05-15 (×7): qty 12.5

## 2022-05-15 NOTE — Progress Notes (Signed)
PROGRESS NOTE   Oscar Rose, is a 61 y.o. male, DOB - 12-10-1960, FIE:[id number]  Admit date - 05/12/2022   Admitting Physician Oswald Hillock, MD  Outpatient Primary MD for the patient is Dione Housekeeper, MD  LOS - 3  Chief Complaint  Patient presents with   Shortness of Breath        Brief Narrative:   61 y.o. 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, sleep apnea, gastroesophageal reflux disease and osteoarthritis admitted on 05/13/2019 with acute  hypoxic  respiratory failure secondary to multifocal pneumonia requiring BiPAP now and transition to high flow nasal cannula at 100% with 25 L    -Assessment and Plan:  1)Acute  hypoxic  respiratory failure due to bilateral multifocal pneumonia -Patient initially required BiPAP continuously -Weaned to high flow nasal cannula  -Hypoxia improving slowly---  -high flow nasal cannula weaned down to 15 L 100% FiO2 from 35 L 100% FiO2 -CT chest consistent with multifocal pneumonia -COVID and flu negative - continue bronchodilators and mucolytics -Pulmonary critical care consult appreciated -Renal function precluded CTA study -VQ scan Negative for PE -ABG was consistent with hypoxemia -Procalcitonin 24.85, ESR 85 -Continue IV vancomycin and cefepime for multifocal pneumonia pending blood culture -Due to Severity of pneumonia check Legionella antigen -A.m. cortisol is NOT Low serum cortisol at 3:30 AM was 35.7  2)Sepsis secondary to multifocal pneumonia--POA -ESR is 85, procalcitonin 24.85, WBC 14 >> 13.4 -Antibiotics and bronchodilators as above #1 -Hypoxia improving slowly---  -high flow nasal cannula weaned down to 15 L  100% FiO2 from 35 L 100% FiO2  3)AKI----acute kidney injury on CKD stage - 3B   creatinine on admission = 3.61  baseline creatinine = around 2.2   ,  -Creatinine peaked at 3.98 creatinine trending back down-----Now 2.83 as of 05/15/22 - renally adjust medications, avoid nephrotoxic agents / dehydration  / hypotension  4)DM2-A1c is 5.5 reflecting excellent diabetic control PTA -Continue Lantus insulin  Use Novolog/Humalog Sliding scale insulin with Accu-Cheks/Fingersticks as ordered   5)HTN----stable,  continue amlodipine  6)H/o PAD/prior Stroke--continue atorvastatin and Plavix for secondary prevention  7)GERD--continue Protonix  8)Acute on chronic anemia--baseline usually around 12 -Suspect chronic anemia of CKD -ESA/Procrit -No bleeding concerns -Query hemodilution from sepsis fluid Rx  9)Lt Foot infection/status post transmetatarsal amputation left foot--imaging studies without osteo---POA -Antibiotics as above #1 -Patient with underlying peripheral artery disease -Continue Lipitor and Plavix  10)Acute  urinary retention--- patient failed voiding trial on 05/14/2022 -Required in and out catheterization x3 after Foley was removed on 05/14/2022 -Foley to be replaced on 05/15/2022 -Continue Flomax as ordered  11)HFrEF--EF 45 to 50% as per echo from 05/13/2022, left ventricular hypokinesis including hypokinesis of the apical anterior wall inferior anterior inferior lateral and lateral walls noted -No aortic stenosis no mitral stenosis -BNP 643 -Chest x-ray and CT chest suggest pulmonary venous congestion/pulmonary edema -IV Lasix as ordered -Fluid intake and output charting requested   Disposition/Need for in-Hospital Stay- patient unable to be discharged at this time due to --acute hypoxic respiratory failure in the setting of sepsis sec to Multi-focal pneumonia requiring IV antibiotics and high flow oxygen  Status is: Inpatient   Disposition: The patient is from: [facility]  [facility]  Anticipated d/c is to: SNF              Anticipated d/c date is: > 3 days              Patient currently is not medically stable to d/c. Barriers: Not Clinically Stable-   Code Status :  -  Code Status: Full Code   Family Communication:    NA (patient is alert, awake and coherent)   DVT Prophylaxis  :   - SCDs  enoxaparin (LOVENOX) injection 40 mg Start: 05/14/22 2100   Lab Results  Component Value Date   PLT 229 05/15/2022   Inpatient Medications  Scheduled Meds:  amLODipine  10 mg Oral Daily   aspirin EC  81 mg Oral Daily   atorvastatin  40 mg Oral Daily   Chlorhexidine Gluconate Cloth  6 each Topical Q0600   clopidogrel  75 mg Oral Daily   dextromethorphan-guaiFENesin  1 tablet Oral BID   enoxaparin (LOVENOX) injection  40 mg Subcutaneous Q24H   furosemide  40 mg Intravenous Daily   icosapent Ethyl  2 g Oral BID   insulin aspart  0-15 Units Subcutaneous TID WC   insulin glargine-yfgn  40 Units Subcutaneous QHS   pantoprazole (PROTONIX) IV  40 mg Intravenous Q24H   sodium chloride flush  3 mL Intravenous Q12H   tamsulosin  0.4 mg Oral QPC supper   cyanocobalamin  500 mcg Oral Daily   Continuous Infusions:  sodium chloride     ceFEPime (MAXIPIME) IV Stopped (05/14/22 2201)   vancomycin Stopped (05/14/22 1213)   PRN Meds:.sodium chloride, albuterol, ipratropium-albuterol, sodium chloride flush   Anti-infectives (From admission, onward)    Start     Dose/Rate Route Frequency Ordered Stop   05/14/22 1000  vancomycin (VANCOREADY) IVPB 1500 mg/300 mL        1,500 mg 150 mL/hr over 120 Minutes Intravenous Every 48 hours 05/12/22 1026     05/13/22 0100  ceFEPIme (MAXIPIME) 2 g in sodium chloride 0.9 % 100 mL IVPB       Note to Pharmacy: Cefepime 2 g IV q24h for CrCl < 30 mL/min   2 g 200 mL/hr over 30 Minutes Intravenous Daily at bedtime 05/13/22 0041     05/12/22 1000  vancomycin (VANCOREADY) IVPB 2000 mg/400 mL        2,000 mg 200 mL/hr  over 120 Minutes Intravenous  Once 05/12/22 0950 05/12/22 1230       Subjective: Oscar Rose today has no fevers, no emesis,  No chest pain,   - Cough and hypoxia persist -Dyspnea persist -Unable to void after removing Foley --Hypoxia improving very slowly---  -high flow nasal cannula weaned down to 15 L 100% FiO2 from 35 L 100% FiO2  Objective: Vitals:   05/15/22 0500 05/15/22 0600 05/15/22 0715 05/15/22 0814  BP: (!) 170/70 (!) 177/78  (!) 166/64  Pulse: 93 88 92   Resp: (!) 21 16 (!) 23   Temp:   97.9 F (36.6 C)   TempSrc:   Oral   SpO2: 100% 95% 98%   Weight:      Height:        Intake/Output Summary (Last 24 hours) at 05/15/2022 1112 Last data filed at 05/15/2022 0736 Gross per 24 hour  Intake 500 ml  Output 2420 ml  Net -1920 ml   Filed Weights   05/12/22 0922 05/13/22 0500 05/14/22 0315  Weight: 115.2 kg 115.1 kg 115.7 kg    Physical  Exam  Gen:- Awake Alert, no acute distress HEENT:- [place].AT, No sclera icterus Nose- HFNC 15 L/min Neck-Supple Neck,No JVD,.  Lungs-diminished breath sounds with scattered rhonchi  CV- S1, S2 normal, regular  Abd-  +ve B.Sounds, Abd Soft, No tenderness,    Psych-affect is appropriate, oriented x3 Neuro-generalized weakness, no new focal deficits, no tremors MSK-left foot with transmetatarsal amputation-some erythema mild swelling noted, right foot with prior big toe amputation GU-Foley catheter replaced on 05/15/2022 due to urinary retention  Data Reviewed: I have personally reviewed following labs and imaging studies  CBC: Recent Labs  Lab 05/12/22 0927 05/13/22 0126 05/14/22 0324 05/15/22 0340  WBC 15.6* 14.6* 14.5* 13.4*  NEUTROABS 14.7*  --   --   --   HGB 10.0* 9.2* 8.6* 8.7*  HCT 29.0* 26.3* 25.0* 25.4*  MCV 87.6 86.2 87.4 87.3  PLT 217 203 197 390   Basic Metabolic Panel: Recent Labs  Lab 05/12/22 0927 05/13/22 0126 05/14/22 0324 05/15/22 0340  NA 127*  --  130* 133*  K 4.3  --  3.7 3.5  CL 93*   --  98 100  CO2 21*  --  22 23  GLUCOSE 230*  --  140* 133*  BUN 49*  --  63* 62*  CREATININE 3.61* 3.98* 3.26* 2.83*  CALCIUM 7.9*  --  8.0* 8.3*  PHOS  --   --   --  3.2   GFR: Estimated Creatinine Clearance: 36.4 mL/min (A) (by C-G formula based on SCr of 2.83 mg/dL (H)). Liver Function Tests: Recent Labs  Lab 05/12/22 0927 05/15/22 0340  AST 40  --   ALT 19  --   ALKPHOS 65  --   BILITOT 1.4*  --   PROT 6.7  --   ALBUMIN 3.1* 2.6*   HbA1C: Recent Labs    05/13/22 0127  HGBA1C 5.5    Recent Results (from the past 240 hour(s))  Urine Culture     Status: None   Collection Time: 05/12/22  9:27 AM   Specimen: In/Out Cath Urine  Result Value Ref Range Status   Specimen Description   Final    IN/OUT CATH URINE Performed at [facility], [address] [place], [place] [address]    Special Requests   Final    NONE Performed at [facility], [address], [place], [place] [address]    Culture   Final    NO GROWTH Performed at [facility], [place] [address], [place], [place] [address]    Report Status 05/14/2022 FINAL  Final  Blood Culture (routine x 2)     Status: None (Preliminary result)   Collection Time: 05/12/22  9:30 AM   Specimen: BLOOD LEFT FOREARM  Result Value Ref Range Status   Specimen Description BLOOD LEFT FOREARM  Final   Special Requests NONE  Final   Culture   Final    NO GROWTH 3 DAYS Performed at [facility], [address] [place], [place] [address]    Report Status PENDING  Incomplete  Resp Panel by RT-PCR (Flu A&B, Covid) Anterior Nasal Swab     Status: None   Collection Time: 05/12/22  9:37 AM   Specimen: Anterior Nasal Swab  Result Value Ref Range Status   SARS Coronavirus 2 by RT PCR NEGATIVE NEGATIVE Final    Comment: (NOTE) SARS-CoV-2 target nucleic acids are NOT DETECTED.  The SARS-CoV-2 RNA is generally detectable in upper respiratory specimens during the acute phase of infection. The  lowest concentration of SARS-CoV-2  viral copies this assay can detect is 138 copies/mL. A negative result does not preclude SARS-Cov-2 infection and should not be used as the sole basis for treatment or other patient management decisions. A negative result may occur with  improper specimen collection/handling, submission of specimen other than nasopharyngeal swab, presence of viral mutation(s) within the areas targeted by this assay, and inadequate number of viral copies(<138 copies/mL). A negative result must be combined with clinical observations, patient history, and epidemiological information. The expected result is Negative.  Fact Sheet for Patients:  EntrepreneurPulse.com.au  Fact Sheet for Healthcare Providers:  IncredibleEmployment.be  This test is no t yet approved or cleared by the [place] FDA and  has been authorized for detection and/or diagnosis of SARS-CoV-2 by FDA under an Emergency Use Authorization (EUA). This EUA will remain  in effect (meaning this test can be used) for the duration of the COVID-19 declaration under Section 564(b)(1) of the Act, 21 U.S.C.section 360bbb-3(b)(1), unless the authorization is terminated  or revoked sooner.       Influenza A by PCR NEGATIVE NEGATIVE Final   Influenza B by PCR NEGATIVE NEGATIVE Final    Comment: (NOTE) The Xpert Xpress SARS-CoV-2/FLU/RSV plus assay is intended as an aid in the diagnosis of influenza from Nasopharyngeal swab specimens and should not be used as a sole basis for treatment. Nasal washings and aspirates are unacceptable for Xpert Xpress SARS-CoV-2/FLU/RSV testing.  Fact Sheet for Patients: EntrepreneurPulse.com.au  Fact Sheet for Healthcare Providers: IncredibleEmployment.be  This test is not yet approved or cleared by the [place] FDA and has been authorized for detection and/or diagnosis of SARS-CoV-2 by FDA under an Emergency Use Authorization  (EUA). This EUA will remain in effect (meaning this test can be used) for the duration of the COVID-19 declaration under Section 564(b)(1) of the Act, 21 U.S.C. section 360bbb-3(b)(1), unless the authorization is terminated or revoked.  Performed at [facility], [address], [place], [place] [address]   Blood Culture (routine x 2)     Status: None (Preliminary result)   Collection Time: 05/12/22 10:33 AM   Specimen: BLOOD  Result Value Ref Range Status   Specimen Description   Final    BLOOD LEFT ANTECUBITAL Performed at [facility], [place] [address], [place], [place] [address]    Special Requests   Final    BOTTLES DRAWN AEROBIC AND ANAEROBIC Blood Culture adequate volume Performed at [facility], [place] [address], [place], [place] [address]    Culture   Final    NO GROWTH 3 DAYS Performed at [facility], [address], [place], [place] [address]    Report Status PENDING  Incomplete  MRSA Next Gen by PCR, Nasal     Status: None   Collection Time: 05/13/22  9:53 AM   Specimen: Nasal Mucosa; Nasal Swab  Result Value Ref Range Status   MRSA by PCR Next Gen NOT DETECTED NOT DETECTED Final    Comment: (NOTE) The GeneXpert MRSA Assay (FDA approved for NASAL specimens only), is one component of a comprehensive MRSA colonization surveillance program. It is not intended to diagnose MRSA infection nor to guide or monitor treatment for MRSA infections. Test performance is not FDA approved in patients less than 44 years old. Performed at [facility], [address] [place], [place] [address]       Radiology Studies: CT CHEST WO CONTRAST  Result Date: 05/14/2022 CLINICAL DATA:  Chronic  dyspnea, hypoxia EXAM: CT CHEST WITHOUT CONTRAST TECHNIQUE: Multidetector CT imaging of the chest was performed following the standard protocol without IV contrast. RADIATION DOSE REDUCTION: This exam was performed according to the departmental  dose-optimization program which includes automated exposure control, adjustment of the mA and/or kV according to patient size and/or use of iterative reconstruction technique. COMPARISON:  Previous studies including chest radiographs done on 05/14/2022 FINDINGS: Cardiovascular: Coronary artery calcifications are seen. Small pericardial effusion is present. There is ectasia of main pulmonary artery measuring 3.9 cm suggesting pulmonary arterial hypertension. Mediastinum/Nodes: There are slightly enlarged lymph nodes in the mediastinum each measuring less than 10 mm in short axis. Lungs/Pleura: Small to moderate bilateral pleural effusions are seen, more so on the right side. There extensive patchy alveolar infiltrates in both lungs, more so on the left side. There are faint ground-glass densities in the mid and lower lung fields. There is no pneumothorax. Upper Abdomen: Spleen is enlarged measuring up to 15.8 cm in diameter. There is subtle increased density in the dependent portion of gallbladder. There is no wall thickening in gallbladder. Musculoskeletal: No acute findings are seen. IMPRESSION: Extensive patchy alveolar and ground-glass infiltrates are seen in both lungs, more so on the left side suggesting multifocal pneumonia. Part of this finding may suggest underlying pulmonary edema. Small to moderate bilateral pleural effusions are seen. Coronary artery disease. There is ectasia of main pulmonary artery suggesting pulmonary arterial hypertension. Small pericardial effusion. There is subtle increased density in the lumen of gallbladder suggesting presence of sludge and possibly tiny stones. Enlarged spleen. Electronically Signed   By: Elmer Picker M.D.   On: 05/14/2022 12:33   DG Chest 2 View  Result Date: 05/14/2022 CLINICAL DATA:  Dyspnea, low oxygen saturation EXAM: CHEST - 2 VIEW COMPARISON:  05/12/2022 FINDINGS: Upper normal size cardiac silhouette. Mediastinal contours normal. Patchy  airspace infiltrates bilaterally question multifocal pneumonia less likely asymmetric edema. No pleural effusion or pneumothorax. IMPRESSION: Patchy BILATERAL pulmonary infiltrates favoring multifocal pneumonia over pulmonary edema. Electronically Signed   By: Lavonia Dana M.D.   On: 05/14/2022 12:16   NM Pulmonary Perfusion  Result Date: 05/14/2022 CLINICAL DATA:  Dyspnea, shortness of breath and chest pain EXAM: NUCLEAR MEDICINE PERFUSION LUNG SCAN TECHNIQUE: Perfusion images were obtained in multiple projections after intravenous injection of radiopharmaceutical. Ventilation scans intentionally deferred if perfusion scan and chest x-ray adequate for interpretation during COVID 19 epidemic. RADIOPHARMACEUTICALS:  3.8 mCi Tc-71mMAA IV COMPARISON:  None Radiographic correlation: Chest radiographs 05/14/2022 FINDINGS: Normal perfusion lung scan. No perfusion defects. IMPRESSION: Normal perfusion lung scan. Electronically Signed   By: MLavonia DanaM.D.   On: 05/14/2022 12:13    Scheduled Meds:  amLODipine  10 mg Oral Daily   aspirin EC  81 mg Oral Daily   atorvastatin  40 mg Oral Daily   Chlorhexidine Gluconate Cloth  6 each Topical Q0600   clopidogrel  75 mg Oral Daily   dextromethorphan-guaiFENesin  1 tablet Oral BID   enoxaparin (LOVENOX) injection  40 mg Subcutaneous Q24H   furosemide  40 mg Intravenous Daily   icosapent Ethyl  2 g Oral BID   insulin aspart  0-15 Units Subcutaneous TID WC   insulin glargine-yfgn  40 Units Subcutaneous QHS   pantoprazole (PROTONIX) IV  40 mg Intravenous Q24H   sodium chloride flush  3 mL Intravenous Q12H   tamsulosin  0.4 mg Oral QPC supper   cyanocobalamin  500 mcg Oral Daily   Continuous Infusions:  sodium  chloride     ceFEPime (MAXIPIME) IV Stopped (05/14/22 2201)   vancomycin Stopped (05/14/22 1213)    LOS: 3 days   Roxan Hockey M.D on 05/15/2022 at 11:12 AM  Go to [email or web address] - for contact info  [facility] - Office   [phone]  If 7PM-7AM, please contact night-coverage [email or web address] 05/15/2022, 11:12 AM

## 2022-05-15 NOTE — Progress Notes (Signed)
Pharmacy Antibiotic Note  Oscar Rose is a 61 y.o. NH patient admitted on 05/12/2022 with multi-focal PNA and possible left foot cellulitis (hx PVD, bil. Toe amputation, osteo in february).  Pharmacy assisting with vancomycin dosing. Renal function improving and returning close to baseline, will adjust antibiotics accordingly.   Vanc kinetics: Target AUC 515; Ke 0.029; Vd 58L  Plan: Empirically adjust Vancomycin to 1750mg  IV q 48h Cefepime 2gm q 12h  Height: 5\' 11"  (180.3 cm) Weight: 115.7 kg (255 lb 1.2 oz) IBW/kg (Calculated) : 75.3  Temp (24hrs), Avg:98.2 F (36.8 C), Min:97.8 F (36.6 C), Max:98.9 F (37.2 C)  Recent Labs  Lab 05/12/22 0927 05/12/22 1120 05/13/22 0126 05/14/22 0324 05/15/22 0340  WBC 15.6*  --  14.6* 14.5* 13.4*  CREATININE 3.61*  --  3.98* 3.26* 2.83*  LATICACIDVEN 1.9 1.4  --   --   --     Estimated Creatinine Clearance: 36.4 mL/min (A) (by C-G formula based on SCr of 2.83 mg/dL (H)).    No Known Allergies  Antimicrobials this admission: 10/8 Vanc>> 10/9 Cefepime>>  Microbiology results: 10/8 BCx: ngtd 10/8 UCx: negative  10/9 MRSA PCR: negative  Thank you for allowing pharmacy to be a part of this patient's care.  Lorenso Courier [facility] 05/15/2022 1:06 PM

## 2022-05-15 NOTE — Progress Notes (Signed)
[place] Progress Note Patient Name: Oscar Rose DOB: June 02, 1961 MRN: [id number]   Date of Service  05/15/2022  HPI/Events of Note  K+ 3.5, Cr 2.83, patient is being diuresed.  eICU Interventions  K-DUR 20 meq po x 1 ordered.        Kerry Kass Emelio Schneller 05/15/2022, 6:53 AM

## 2022-05-16 ENCOUNTER — Inpatient Hospital Stay (HOSPITAL_COMMUNITY): Payer: Medicare Other

## 2022-05-16 DIAGNOSIS — N179 Acute kidney failure, unspecified: Secondary | ICD-10-CM | POA: Diagnosis not present

## 2022-05-16 DIAGNOSIS — J189 Pneumonia, unspecified organism: Secondary | ICD-10-CM | POA: Diagnosis not present

## 2022-05-16 DIAGNOSIS — J9601 Acute respiratory failure with hypoxia: Secondary | ICD-10-CM | POA: Diagnosis not present

## 2022-05-16 DIAGNOSIS — A419 Sepsis, unspecified organism: Secondary | ICD-10-CM | POA: Diagnosis not present

## 2022-05-16 LAB — CBC WITH DIFFERENTIAL/PLATELET
Abs Immature Granulocytes: 0.14 10*3/uL — ABNORMAL HIGH (ref 0.00–0.07)
Basophils Absolute: 0 10*3/uL (ref 0.0–0.1)
Basophils Relative: 0 %
Eosinophils Absolute: 0.1 10*3/uL (ref 0.0–0.5)
Eosinophils Relative: 1 %
HCT: 25.8 % — ABNORMAL LOW (ref 39.0–52.0)
Hemoglobin: 9 g/dL — ABNORMAL LOW (ref 13.0–17.0)
Immature Granulocytes: 1 %
Lymphocytes Relative: 8 %
Lymphs Abs: 0.9 10*3/uL (ref 0.7–4.0)
MCH: 30.3 pg (ref 26.0–34.0)
MCHC: 34.9 g/dL (ref 30.0–36.0)
MCV: 86.9 fL (ref 80.0–100.0)
Monocytes Absolute: 0.8 10*3/uL (ref 0.1–1.0)
Monocytes Relative: 8 %
Neutro Abs: 8.7 10*3/uL — ABNORMAL HIGH (ref 1.7–7.7)
Neutrophils Relative %: 82 %
Platelets: 281 10*3/uL (ref 150–400)
RBC: 2.97 MIL/uL — ABNORMAL LOW (ref 4.22–5.81)
RDW: 12.3 % (ref 11.5–15.5)
WBC: 10.7 10*3/uL — ABNORMAL HIGH (ref 4.0–10.5)
nRBC: 0 % (ref 0.0–0.2)

## 2022-05-16 LAB — GLUCOSE, CAPILLARY
Glucose-Capillary: 105 mg/dL — ABNORMAL HIGH (ref 70–99)
Glucose-Capillary: 91 mg/dL (ref 70–99)
Glucose-Capillary: 90 mg/dL (ref 70–99)
Glucose-Capillary: 120 mg/dL — ABNORMAL HIGH (ref 70–99)

## 2022-05-16 LAB — PROCALCITONIN: Procalcitonin: 3.78 ng/mL

## 2022-05-16 LAB — RENAL FUNCTION PANEL
Albumin: 2.7 g/dL — ABNORMAL LOW (ref 3.5–5.0)
Anion gap: 10 (ref 5–15)
BUN: 55 mg/dL — ABNORMAL HIGH (ref 6–20)
CO2: 23 mmol/L (ref 22–32)
Calcium: 8.4 mg/dL — ABNORMAL LOW (ref 8.9–10.3)
Chloride: 102 mmol/L (ref 98–111)
Creatinine, Ser: 2.7 mg/dL — ABNORMAL HIGH (ref 0.61–1.24)
GFR, Estimated: 26 mL/min — ABNORMAL LOW (ref >60)
Glucose, Bld: 102 mg/dL — ABNORMAL HIGH (ref 70–99)
Phosphorus: 3.2 mg/dL (ref 2.5–4.6)
Potassium: 3.3 mmol/L — ABNORMAL LOW (ref 3.5–5.1)
Sodium: 135 mmol/L (ref 135–145)

## 2022-05-16 LAB — BRAIN NATRIURETIC PEPTIDE: B Natriuretic Peptide: 769 pg/mL — ABNORMAL HIGH (ref 0.0–100.0)

## 2022-05-16 MED ORDER — MAGNESIUM SULFATE 2 GM/50ML IV SOLN
2.0000 g | Freq: Once | INTRAVENOUS | Status: AC
  Administered 2022-05-16: 2 g via INTRAVENOUS
  Filled 2022-05-16: qty 50

## 2022-05-16 MED ORDER — POTASSIUM CHLORIDE CRYS ER 20 MEQ PO TBCR
40.0000 meq | EXTENDED_RELEASE_TABLET | Freq: Once | ORAL | Status: AC
  Administered 2022-05-16: 40 meq via ORAL
  Filled 2022-05-16: qty 2

## 2022-05-16 NOTE — Progress Notes (Signed)
NAME:  Oscar Rose, MRN:  [id number], DOB:  [birth year], LOS: 4 ADMISSION DATE:  05/12/2022, CONSULTATION DATE:  05/13/22 REFERRING MD:  Denton Brick, CHIEF COMPLAINT:  Acute resp failure   History of Present Illness:   61 y.o. 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 He was hospitalized at [facility] with osteomyelitis of left foot admitted on 09/25/2021 and subsequently discharged to SNF on 10/05/2021. During that admission he had left-sided transmetatarsal amputation.  Patient is followed by [facility] wound care clinic - seen at wound care clinic on 05/02/2022.  He was seen by [facility] cardiology on 05/07/2022 for possible CHF and was ruled out of CHF.  As per note from cardiology his echocardiogram showed preserved ejection fraction.   He was brought to the ED 10/8 p  found in AM  with oxygen saturation of 30% on room air, he was placed on nonrebreather and EMS was called.  Patient denies any fever or chills.  Complains of nasal congestion and sinus pressure.  Note  not typically on home oxygen.   Initially patient was placed on BiPAP, which has been weaned off but still requiring HFNC 25 L/min.  SARS-CoV-2 RT-PCR, influenza A and influenza B by PCR all negative   Chest x-ray in the ED showed diffuse interstitial and heterogeneous pulmonary opacities with differential including infectious versus inflammatory versus pulmonary edema.  X-ray of left foot showed forefoot amputations with surrounding soft tissue swelling but no evidence of osteomyelitis.   Patient was started on vancomycin in the ED. lactic acid 1.9, repeat lactic acid  1.4.      Significant Hospital Events: Including procedures, antibiotic start and stop dates in addition to other pertinent events   BC x 2  10/8 >>> UC 10/8 neg  Resp viral Panel  10/8  neg  MRSA PCR  screen 10/9  neg   CT chest w/o contrast 10/10 1. Left ventricular ejection fraction, by estimation, is 45 to 50%. The  left ventricle has mildly decreased function. The left ventricle  demonstrates regional wall motion abnormalities (see scoring  diagram/findings for description). There is mild  concentric left ventricular hypertrophy. Left ventricular diastolic  parameters are indeterminate. Elevated left ventricular end-diastolic  pressure. There is hypokinesis of the left ventricular, apical anterior  wall, inferior wall, lateral wall, inferolateral wall and apical segment.   2. Right ventricular systolic function is normal. The right ventricular  size is normal. Tricuspid regurgitation signal is inadequate for assessing  PA pressure.   3. The mitral valve is normal in structure. Trivial mitral valve  regurgitation. No evidence of mitral stenosis.   4. The aortic valve is tricuspid. Aortic valve regurgitation is not  visualized. Aortic valve sclerosis/calcification is present, without any  evidence of aortic stenosis.   5. There is mild dilatation of the aortic root, measuring 41 mm. There is  mild dilatation of the ascending aorta, measuring 39 mm.   6. The inferior vena cava is normal in size with greater than 50%  respiratory variability, suggesting right atrial pressure of 3 mmHg    Scheduled Meds:  amLODipine  10 mg Oral Daily   aspirin EC  81 mg Oral Daily   atorvastatin  40 mg Oral  Daily   Chlorhexidine Gluconate Cloth  6 each Topical Q0600   clopidogrel  75 mg Oral Daily   dextromethorphan-guaiFENesin  1 tablet Oral BID   enoxaparin (LOVENOX) injection  40 mg Subcutaneous Q24H   furosemide  40 mg Intravenous Daily   icosapent Ethyl  2 g Oral BID   insulin aspart   0-15 Units Subcutaneous TID WC   insulin glargine-yfgn  40 Units Subcutaneous QHS   pantoprazole (PROTONIX) IV  40 mg Intravenous Q24H   sodium chloride flush  3 mL Intravenous Q12H   tamsulosin  0.4 mg Oral QPC supper   cyanocobalamin  500 mcg Oral Daily   Continuous Infusions:  sodium chloride     ceFEPime (MAXIPIME) IV Stopped (05/16/22 1045)   PRN Meds:.sodium chloride, albuterol, ipratropium-albuterol, labetalol, sodium chloride flush    Interim History / Subjective:  Better color than last visit, denies pain or nausea  Objective   Blood pressure (!) 161/61, pulse 75, temperature 98.2 F (36.8 C), temperature source Oral, resp. rate 18, height $RemoveBe'5\' 11"'[id number]$  (1.803 m), weight 115.7 kg, SpO2 100 %.    FiO2 (%):  [100 %] 100 %   Intake/Output Summary (Last 24 hours) at 05/16/2022 1629 Last data filed at 05/16/2022 1248 Gross per 24 hour  Intake 337.64 ml  Output 2500 ml  Net -2162.36 ml   Filed Weights   05/13/22 0500 05/14/22 0315 05/16/22 0500  Weight: 115.1 kg 115.7 kg 115.7 kg    Examination: Tmax:  98 General appearance:    obese wm lying immobile at 30 degrees hob  At Rest 02 sats  100% on 8 lpm HFNC   No jvd Oropharynx clear,  mucosa nl Neck supple Lungs with a few scattered exp > insp rhonchi bilaterally RRR no s3 or or sign murmur Abd obese with limitd  excursion  Extr warm with no edema or clubbing noted Neuro  Sensorium intact,  no apparent motor deficits       I personally reviewed images and agree with radiology impression as follows:  CXR:   pa and lat 10'/12   Improved airspace infiltrates.       Assessment & Plan:  1) Acute respiratory failure c/w ALI (ARDS like) in setting fever and suspected L foot infection s/p prior amputations with severe PVD /underlying DM >>> rx is supportive, keep fluids on dry side if bp/ bun/creat allow which will be a tough challenge here   2) CRI/ AKI   Lab Results  Component Value Date   CREATININE 2.70 (H)  05/16/2022   CREATININE 2.83 (H) 05/15/2022   CREATININE 3.26 (H) 05/14/2022   >>> diuresis as tol   3) Sepsis with high PCT trending down nicely > rx per [facility] for foot source appropriate - ok with maxepime 10/8 >>>         vanc  10//8 >>> d/c'd   10/12  -  ESR  10/9  = 85 so clearly FP component but improving radiographically so follow off steroids for now   4)  Likely normocytic anemia of chronic dz  Lab Results  Component Value Date   HGB 9.0 (L) 05/16/2022   HGB 8.7 (L) 05/15/2022   HGB 8.6 (L) 05/14/2022   HGB 14.1 04/30/2017   Trending up now  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     Best Practice (right click and "Reselect all SmartList Selections" daily)    Per [facility]  Labs   CBC: Recent Labs  Lab 05/12/22 0927 05/13/22 0126  05/14/22 0324 05/15/22 0340 05/16/22 0414  WBC 15.6* 14.6* 14.5* 13.4* 10.7*  NEUTROABS 14.7*  --   --   --  8.7*  HGB 10.0* 9.2* 8.6* 8.7* 9.0*  HCT 29.0* 26.3* 25.0* 25.4* 25.8*  MCV 87.6 86.2 87.4 87.3 86.9  PLT 217 203 197 229 267    Basic Metabolic Panel: Recent Labs  Lab 05/12/22 0927 05/13/22 0126 05/14/22 0324 05/15/22 0340 05/16/22 0414  NA 127*  --  130* 133* 135  K 4.3  --  3.7 3.5 3.3*  CL 93*  --  98 100 102  CO2 21*  --  _0 GLUCOSE 230*  --  140* 133* 102*  BUN 49*  --  63* 62* 55*  CREATININE 3.61* 3.98* 3.26* 2.83* 2.70*  CALCIUM 7.9*  --  8.0* 8.3* 8.4*  PHOS  --   --   --  3.2 3.2   GFR: Estimated Creatinine Clearance: 38.1 mL/min (A) (by C-G formula based on SCr of 2.7 mg/dL (H)). Recent Labs  Lab 05/12/22 0927 05/12/22 1120 05/13/22 0126 05/14/22 0324 05/15/22 0340 05/16/22 0414  PROCALCITON  --   --  24.85  --   --  3.78  WBC 15.6*  --  14.6* 14.5* 13.4* 10.7*  LATICACIDVEN 1.9 1.4  --   --   --   --     Liver Function Tests: Recent Labs  Lab 05/12/22 0927 05/15/22 0340 05/16/22 0414  AST 40  --   --   ALT 19  --   --   ALKPHOS 65  --   --   BILITOT 1.4*  --   --   PROT 6.7  --    --   ALBUMIN 3.1* 2.6* 2.7*   Recent Labs  Lab 05/14/22 0324  LIPASE 25  AMYLASE 28   No results for input(s): "AMMONIA" in the last 168 hours.  ABG    Component Value Date/Time   PHART 7.41 05/12/2022 1635   PCO2ART 36 05/12/2022 1635   PO2ART 56 (L) 05/12/2022 1635   HCO3 22.6 05/12/2022 1635   TCO2 27 11/08/2021 0728   ACIDBASEDEF 1.1 05/12/2022 1635   O2SAT 84.6 05/12/2022 1635     Coagulation Profile: Recent Labs  Lab 05/12/22 0927  INR 1.2    Cardiac Enzymes: No results for input(s): "CKTOTAL", "CKMB", "CKMBINDEX", "TROPONINI" in the last 168 hours.  HbA1C: Hgb A1c MFr Bld  Date/Time Value Ref Range Status  05/13/2022 01:27 AM 5.5 4.8 - 5.6 % Final    Comment:    (NOTE) Pre diabetes:          5.7%-6.4%  Diabetes:              >6.4%  Glycemic control for   <7.0% adults with diabetes   04/04/2016 04:10 PM 8.3 (H) 4.8 - 5.6 % Final    Comment:    (NOTE)         Pre-diabetes: 5.7 - 6.4         Diabetes: >6.4         Glycemic control for adults with diabetes: <7.0     CBG: Recent Labs  Lab 05/15/22 1601 05/15/22 2114 05/16/22 0717 05/16/22 1113 05/16/22 1559  GLUCAP 105* 142* 105* 91 90      Christinia Gully, MD [facility] [place] [phone]   After 7:00 pm call Elink  [phone]

## 2022-05-16 NOTE — Progress Notes (Signed)
PROGRESS NOTE   Oscar Rose, is a 61 y.o. male, DOB - Aug 13, 1960, XQK:[id number]  Admit date - 05/12/2022   Admitting Physician Oswald Hillock, MD  Outpatient Primary MD for the patient is Dione Housekeeper, MD  LOS - 4  Chief Complaint  Patient presents with   Shortness of Breath      Subjective: Oscar Rose was seen and examined this morning, awake alert oriented, not motivated -Hypoxia continue to improve, down from 15 L of oxygen by nasal cannula to 8 L -Cultures remain negative to date, has been on IV manage antibiotics Vanco/cefepime Since 05/12/2022  Improving sepsis physiology -has been on high flow nasal cannula weaned down to 15 L 100% FiO2 from 35 L 100% FiO2  Brief Narrative:   Oscar Rose is a  61 y.o. 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, sleep apnea, gastroesophageal reflux disease and osteoarthritis admitted on 05/13/2019 with acute  hypoxic  respiratory failure secondary to multifocal pneumonia requiring BiPAP now and transition to high flow nasal cannula at 100% with 25 L    -Assessment and Plan:  1) sepsis with acute  hypoxic  respiratory failure due to bilateral multifocal pneumonia -Patient initially required BiPAP continuously -Weaned to high flow nasal cannula  -Continue improving hypoxia, down to 8 L of oxygen satting 99% -high flow nasal cannula weaned down to 15 L 100% FiO2 from 35 L 100% FiO2  -CT chest consistent with multifocal pneumonia -COVID and flu negative - continue bronchodilators and mucolytics -Pulmonary critical care consulted -continue to follow closely -Renal function precluded CTA study -VQ scan Negative for PE -ABG  was consistent with hypoxemia -Procalcitonin 24.85, ESR 85 -Continue IV vancomycin and cefepime for multifocal pneumonia pending blood culture As cultures remain negative, considering discontinuing vancomycin  -Due to Severity of pneumonia check Legionella antigen -A.m. cortisol is NOT Low serum cortisol at 3:30 AM was 35.7  2)Sepsis secondary to multifocal pneumonia--POA -ESR is 85, procalcitonin 24.85, WBC 14 >> 13.4 >>> 10.7 Trending down procalcitonin level -Antibiotics and bronchodilators as above #1   3)AKI----acute kidney injury on CKD stage - 3B  - Creatinine on admission = 3.61  baseline creatinine = around 2.2   ,  -Creatinine peaked at 3.98 >>2.7  - renally adjust medications, avoid nephrotoxic agents / dehydration  / hypotension  4)DM2-A1c is 5.5 reflecting excellent diabetic control PTA -Continue Lantus insulin  Use Novolog/Humalog Sliding scale insulin with Accu-Cheks/Fingersticks as ordered   5)HTN----stable,  continue amlodipine  6)H/o PAD/prior Stroke--continue atorvastatin and Plavix for secondary prevention  7)GERD--continue Protonix  8)Acute on chronic anemia--baseline usually around 12 -Suspect chronic anemia of CKD -ESA/Procrit -No bleeding concerns -Query hemodilution from sepsis fluid Rx  9)Lt Foot infection/status post transmetatarsal amputation left foot--imaging studies without osteo---POA -Antibiotics as above #1 -Patient with underlying peripheral artery disease -Continue Lipitor and Plavix  10)Acute  urinary retention--- patient failed voiding trial on 05/14/2022 -Required in and out catheterization x3 after Foley was removed on 05/14/2022 -Foley to be replaced on 05/15/2022 -Continue Flomax as ordered  11)HFrEF--EF 45 to 50% as per echo from 05/13/2022, left ventricular hypokinesis including hypokinesis of the apical anterior wall inferior anterior inferior lateral and lateral walls noted -No aortic stenosis no mitral stenosis -BNP  643 -  Chest x-ray and CT chest suggest pulmonary venous congestion/pulmonary edema -IV Lasix as ordered -Fluid intake and output charting requested   Disposition/Need for in-Hospital Stay- patient unable to be discharged at this time due to --acute hypoxic respiratory failure in the setting of sepsis sec to Multi-focal pneumonia requiring IV antibiotics and high flow oxygen  Status is: Inpatient   Disposition: The patient is from: SNF [facility]              Anticipated d/c is to: SNF              Anticipated d/c date is: > 3 days              Patient currently is not medically stable to d/c. Barriers: Not Clinically Stable-   Code Status :  -  Code Status: Full Code   Family Communication:    NA (patient is alert, awake and coherent)   DVT Prophylaxis  :   - SCDs  enoxaparin (LOVENOX) injection 40 mg Start: 05/14/22 2100   Lab Results  Component Value Date   PLT 281 05/16/2022   Inpatient Medications  Scheduled Meds:  amLODipine  10 mg Oral Daily   aspirin EC  81 mg Oral Daily   atorvastatin  40 mg Oral Daily   Chlorhexidine Gluconate Cloth  6 each Topical Q0600   clopidogrel  75 mg Oral Daily   dextromethorphan-guaiFENesin  1 tablet Oral BID   enoxaparin (LOVENOX) injection  40 mg Subcutaneous Q24H   furosemide  40 mg Intravenous Daily   icosapent Ethyl  2 g Oral BID   insulin aspart  0-15 Units Subcutaneous TID WC   insulin glargine-yfgn  40 Units Subcutaneous QHS   pantoprazole (PROTONIX) IV  40 mg Intravenous Q24H   sodium chloride flush  3 mL Intravenous Q12H   tamsulosin  0.4 mg Oral QPC supper   cyanocobalamin  500 mcg Oral Daily   Continuous Infusions:  sodium chloride     ceFEPime (MAXIPIME) IV Stopped (05/16/22 1045)   PRN Meds:.sodium chloride, albuterol, ipratropium-albuterol, labetalol, sodium chloride flush   Anti-infectives (From admission, onward)    Start     Dose/Rate Route Frequency Ordered Stop   05/16/22 1000  vancomycin (VANCOREADY) IVPB  1750 mg/350 mL  Status:  Discontinued        1,750 mg 175 mL/hr over 120 Minutes Intravenous Every 48 hours 05/15/22 1318 05/16/22 1019   05/15/22 1330  ceFEPIme (MAXIPIME) 2 g in sodium chloride 0.9 % 100 mL IVPB       Note to Pharmacy: Cefepime 2 g IV q24h for CrCl < 30 mL/min   2 g 200 mL/hr over 30 Minutes Intravenous Every 12 hours 05/15/22 1255     05/14/22 1000  vancomycin (VANCOREADY) IVPB 1500 mg/300 mL  Status:  Discontinued        1,500 mg 150 mL/hr over 120 Minutes Intravenous Every 48 hours 05/12/22 1026 05/15/22 1318   05/13/22 0100  ceFEPIme (MAXIPIME) 2 g in sodium chloride 0.9 % 100 mL IVPB  Status:  Discontinued       Note to Pharmacy: Cefepime 2 g IV q24h for CrCl < 30 mL/min   2 g 200 mL/hr over 30 Minutes Intravenous Daily at bedtime 05/13/22 0041 05/15/22 1255   05/12/22 1000  vancomycin (VANCOREADY) IVPB 2000 mg/400 mL        2,000 mg 200 mL/hr over 120 Minutes Intravenous  Once 05/12/22 0950 05/12/22 1230  Objective: Vitals:   05/16/22 0900 05/16/22 1000 05/16/22 1100 05/16/22 1114  BP: (!) 174/64 (!) 170/68 (!) 165/56   Pulse: 78 81 80   Resp: $Remo'15 18 17   'TtnLF$ Temp:    98.4 F (36.9 C)  TempSrc:    Oral  SpO2: 100% 100% 99%   Weight:      Height:        Intake/Output Summary (Last 24 hours) at 05/16/2022 1205 Last data filed at 05/16/2022 1047 Gross per 24 hour  Intake 337.64 ml  Output 1500 ml  Net -1162.36 ml   Filed Weights   05/13/22 0500 05/14/22 0315 05/16/22 0500  Weight: 115.1 kg 115.7 kg 115.7 kg       Physical Exam:   General:  AAO x 3,  cooperative, no distress;   HEENT:  Normocephalic, PERRL, otherwise with in Normal limits   Neuro:  CNII-XII intact. , normal motor and sensation, reflexes intact   Lungs:   Clear to auscultation BL, Respirations unlabored,  No wheezes / crackles  Cardio:    S1/S2, RRR, No murmure, No Rubs or Gallops   Abdomen:  Soft, non-tender, bowel sounds active all four quadrants, no guarding or  peritoneal signs.  Muscular  skeletal:  Limited exam -global generalized weaknesses - in bed, able to move all 4 extremities,   2+ pulses,  symmetric, No pitting edema Heft foot with transmetatarsal amputation-some erythema mild swelling noted, right foot with prior big toe amputation   Skin:  Dry, warm to touch, negative for any Rashes,  Wounds: Please see nursing documentation    GU-Foley catheter replaced on 05/15/2022 due to urinary retention  Data Reviewed: I have personally reviewed following labs and imaging studies  CBC: Recent Labs  Lab 05/12/22 0927 05/13/22 0126 05/14/22 0324 05/15/22 0340 05/16/22 0414  WBC 15.6* 14.6* 14.5* 13.4* 10.7*  NEUTROABS 14.7*  --   --   --  8.7*  HGB 10.0* 9.2* 8.6* 8.7* 9.0*  HCT 29.0* 26.3* 25.0* 25.4* 25.8*  MCV 87.6 86.2 87.4 87.3 86.9  PLT 217 203 197 229 903   Basic Metabolic Panel: Recent Labs  Lab 05/12/22 0927 05/13/22 0126 05/14/22 0324 05/15/22 0340 05/16/22 0414  NA 127*  --  130* 133* 135  K 4.3  --  3.7 3.5 3.3*  CL 93*  --  98 100 102  CO2 21*  --  $R'22 23 23  '[place]$ GLUCOSE 230*  --  140* 133* 102*  BUN 49*  --  63* 62* 55*  CREATININE 3.61* 3.98* 3.26* 2.83* 2.70*  CALCIUM 7.9*  --  8.0* 8.3* 8.4*  PHOS  --   --   --  3.2 3.2   GFR: Estimated Creatinine Clearance: 38.1 mL/min (A) (by C-G formula based on SCr of 2.7 mg/dL (H)). Liver Function Tests: Recent Labs  Lab 05/12/22 0927 05/15/22 0340 05/16/22 0414  AST 40  --   --   ALT 19  --   --   ALKPHOS 65  --   --   BILITOT 1.4*  --   --   PROT 6.7  --   --   ALBUMIN 3.1* 2.6* 2.7*   HbA1C: No results for input(s): "HGBA1C" in the last 72 hours.   Recent Results (from the past 240 hour(s))  Urine Culture     Status: None   Collection Time: 05/12/22  9:27 AM   Specimen: In/Out Cath Urine  Result Value Ref Range Status   Specimen Description  Final    IN/OUT CATH URINE Performed at [facility], [address], [place], [place] [address]    Special  Requests   Final    NONE Performed at [facility], [address], [place], [place] [address]    Culture   Final    NO GROWTH Performed at [facility], [place] [address], [place], [place] [address]    Report Status 05/14/2022 FINAL  Final  Blood Culture (routine x 2)     Status: None (Preliminary result)   Collection Time: 05/12/22  9:30 AM   Specimen: BLOOD LEFT FOREARM  Result Value Ref Range Status   Specimen Description BLOOD LEFT FOREARM  Final   Special Requests NONE  Final   Culture   Final    NO GROWTH 4 DAYS Performed at [facility], [address], [place], [place] [address]    Report Status PENDING  Incomplete  Resp Panel by RT-PCR (Flu A&B, Covid) Anterior Nasal Swab     Status: None   Collection Time: 05/12/22  9:37 AM   Specimen: Anterior Nasal Swab  Result Value Ref Range Status   SARS Coronavirus 2 by RT PCR NEGATIVE NEGATIVE Final    Comment: (NOTE) SARS-CoV-2 target nucleic acids are NOT DETECTED.  The SARS-CoV-2 RNA is generally detectable in upper respiratory specimens during the acute phase of infection. The lowest concentration of SARS-CoV-2 viral copies this assay can detect is 138 copies/mL. A negative result does not preclude SARS-Cov-2 infection and should not be used as the sole basis for treatment or other patient management decisions. A negative result may occur with  improper specimen collection/handling, submission of specimen other than nasopharyngeal swab, presence of viral mutation(s) within the areas targeted by this assay, and inadequate number of viral copies(<138 copies/mL). A negative result must be combined with clinical observations, patient history, and epidemiological information. The expected result is Negative.  Fact Sheet for Patients:  EntrepreneurPulse.com.au  Fact Sheet for Healthcare Providers:  IncredibleEmployment.be  This test is no t yet approved or cleared by the [place]  FDA and  has been authorized for detection and/or diagnosis of SARS-CoV-2 by FDA under an Emergency Use Authorization (EUA). This EUA will remain  in effect (meaning this test can be used) for the duration of the COVID-19 declaration under Section 564(b)(1) of the Act, 21 U.S.C.section 360bbb-3(b)(1), unless the authorization is terminated  or revoked sooner.       Influenza A by PCR NEGATIVE NEGATIVE Final   Influenza B by PCR NEGATIVE NEGATIVE Final    Comment: (NOTE) The Xpert Xpress SARS-CoV-2/FLU/RSV plus assay is intended as an aid in the diagnosis of influenza from Nasopharyngeal swab specimens and should not be used as a sole basis for treatment. Nasal washings and aspirates are unacceptable for Xpert Xpress SARS-CoV-2/FLU/RSV testing.  Fact Sheet for Patients: EntrepreneurPulse.com.au  Fact Sheet for Healthcare Providers: IncredibleEmployment.be  This test is not yet approved or cleared by the [place] FDA and has been authorized for detection and/or diagnosis of SARS-CoV-2 by FDA under an Emergency Use Authorization (EUA). This EUA will remain in effect (meaning this test can be used) for the duration of the COVID-19 declaration under Section 564(b)(1) of the Act, 21 U.S.C. section 360bbb-3(b)(1), unless the authorization is terminated or revoked.  Performed at [facility], [address], [place], [place] [address]   Blood Culture (routine x 2)     Status: None (Preliminary result)   Collection Time: 05/12/22 10:33 AM  Specimen: BLOOD  Result Value Ref Range Status   Specimen Description   Final    BLOOD LEFT ANTECUBITAL Performed at [facility], [place] [address], [place], [place] [address]    Special Requests   Final    BOTTLES DRAWN AEROBIC AND ANAEROBIC Blood Culture adequate volume Performed at [facility], [place] [address] [place], [place] [address]    Culture    Final    NO GROWTH 4 DAYS Performed at [facility], [address], [place], [place] [address]    Report Status PENDING  Incomplete  MRSA Next Gen by PCR, Nasal     Status: None   Collection Time: 05/13/22  9:53 AM   Specimen: Nasal Mucosa; Nasal Swab  Result Value Ref Range Status   MRSA by PCR Next Gen NOT DETECTED NOT DETECTED Final    Comment: (NOTE) The GeneXpert MRSA Assay (FDA approved for NASAL specimens only), is one component of a comprehensive MRSA colonization surveillance program. It is not intended to diagnose MRSA infection nor to guide or monitor treatment for MRSA infections. Test performance is not FDA approved in patients less than 27 years old. Performed at [facility], [address], [place],  [address]       Radiology Studies: DG Chest 2 View  Result Date: 05/16/2022 CLINICAL DATA:  Dyspnea, respiratory abnormalities EXAM: CHEST - 2 VIEW COMPARISON:  05/14/2022 FINDINGS: Upper normal size of cardiac silhouette. Mediastinal contours normal. Hazy airspace infiltrates greater on LEFT, slightly improved from previous exam. No pleural effusion or pneumothorax. Osseous structures unremarkable. IMPRESSION: Improved airspace infiltrates. Electronically Signed   By: Lavonia Dana M.D.   On: 05/16/2022 08:55   CT CHEST WO CONTRAST  Result Date: 05/14/2022 CLINICAL DATA:  Chronic dyspnea, hypoxia EXAM: CT CHEST WITHOUT CONTRAST TECHNIQUE: Multidetector CT imaging of the chest was performed following the standard protocol without IV contrast. RADIATION DOSE REDUCTION: This exam was performed according to the departmental dose-optimization program which includes automated exposure control, adjustment of the mA and/or kV according to patient size and/or use of iterative reconstruction technique. COMPARISON:  Previous studies including chest radiographs done on 05/14/2022 FINDINGS: Cardiovascular: Coronary artery calcifications are seen. Small pericardial effusion is  present. There is ectasia of main pulmonary artery measuring 3.9 cm suggesting pulmonary arterial hypertension. Mediastinum/Nodes: There are slightly enlarged lymph nodes in the mediastinum each measuring less than 10 mm in short axis. Lungs/Pleura: Small to moderate bilateral pleural effusions are seen, more so on the right side. There extensive patchy alveolar infiltrates in both lungs, more so on the left side. There are faint ground-glass densities in the mid and lower lung fields. There is no pneumothorax. Upper Abdomen: Spleen is enlarged measuring up to 15.8 cm in diameter. There is subtle increased density in the dependent portion of gallbladder. There is no wall thickening in gallbladder. Musculoskeletal: No acute findings are seen. IMPRESSION: Extensive patchy alveolar and ground-glass infiltrates are seen in both lungs, more so on the left side suggesting multifocal pneumonia. Part of this finding may suggest underlying pulmonary edema. Small to moderate bilateral pleural effusions are seen. Coronary artery disease. There is ectasia of main pulmonary artery suggesting pulmonary arterial hypertension. Small pericardial effusion. There is subtle increased density in the lumen of gallbladder suggesting presence of sludge and possibly tiny stones. Enlarged spleen. Electronically Signed   By: Elmer Picker M.D.   On: 05/14/2022 12:33    Scheduled Meds:  amLODipine  10 mg Oral  Daily   aspirin EC  81 mg Oral Daily   atorvastatin  40 mg Oral Daily   Chlorhexidine Gluconate Cloth  6 each Topical Q0600   clopidogrel  75 mg Oral Daily   dextromethorphan-guaiFENesin  1 tablet Oral BID   enoxaparin (LOVENOX) injection  40 mg Subcutaneous Q24H   furosemide  40 mg Intravenous Daily   icosapent Ethyl  2 g Oral BID   insulin aspart  0-15 Units Subcutaneous TID WC   insulin glargine-yfgn  40 Units Subcutaneous QHS   pantoprazole (PROTONIX) IV  40 mg Intravenous Q24H   sodium chloride flush  3 mL  Intravenous Q12H   tamsulosin  0.4 mg Oral QPC supper   cyanocobalamin  500 mcg Oral Daily   Continuous Infusions:  sodium chloride     ceFEPime (MAXIPIME) IV Stopped (05/16/22 1045)    LOS: 4 days   Deatra James M.D on 05/16/2022 at 12:05 PM  Go to [email or web address] - for contact info  [facility] - Office  [phone]  If 7PM-7AM, please contact night-coverage [email or web address] 05/16/2022, 12:05 PM

## 2022-05-17 DIAGNOSIS — A419 Sepsis, unspecified organism: Secondary | ICD-10-CM | POA: Diagnosis not present

## 2022-05-17 DIAGNOSIS — J189 Pneumonia, unspecified organism: Secondary | ICD-10-CM | POA: Diagnosis not present

## 2022-05-17 LAB — CULTURE, BLOOD (ROUTINE X 2)
Culture: NO GROWTH
Culture: NO GROWTH
Special Requests: ADEQUATE

## 2022-05-17 LAB — BASIC METABOLIC PANEL
Anion gap: 9 (ref 5–15)
BUN: 53 mg/dL — ABNORMAL HIGH (ref 6–20)
CO2: 24 mmol/L (ref 22–32)
Calcium: 8.3 mg/dL — ABNORMAL LOW (ref 8.9–10.3)
Chloride: 101 mmol/L (ref 98–111)
Creatinine, Ser: 2.58 mg/dL — ABNORMAL HIGH (ref 0.61–1.24)
GFR, Estimated: 28 mL/min — ABNORMAL LOW (ref >60)
Glucose, Bld: 76 mg/dL (ref 70–99)
Potassium: 3.4 mmol/L — ABNORMAL LOW (ref 3.5–5.1)
Sodium: 134 mmol/L — ABNORMAL LOW (ref 135–145)

## 2022-05-17 LAB — GLUCOSE, CAPILLARY
Glucose-Capillary: 72 mg/dL (ref 70–99)
Glucose-Capillary: 100 mg/dL — ABNORMAL HIGH (ref 70–99)
Glucose-Capillary: 137 mg/dL — ABNORMAL HIGH (ref 70–99)
Glucose-Capillary: 135 mg/dL — ABNORMAL HIGH (ref 70–99)

## 2022-05-17 LAB — LEGIONELLA PNEUMOPHILA SEROGP 1 UR AG: L. pneumophila Serogp 1 Ur Ag: NEGATIVE

## 2022-05-17 MED ORDER — FLORANEX PO PACK
1.0000 g | PACK | Freq: Three times a day (TID) | ORAL | Status: DC
  Administered 2022-05-17 – 2022-05-18 (×4): 1 g via ORAL
  Filled 2022-05-17 (×11): qty 1

## 2022-05-17 MED ORDER — HYDRALAZINE HCL 25 MG PO TABS
50.0000 mg | ORAL_TABLET | Freq: Three times a day (TID) | ORAL | Status: DC
  Administered 2022-05-17 – 2022-05-18 (×4): 50 mg via ORAL
  Filled 2022-05-17 (×4): qty 2

## 2022-05-17 MED ORDER — PANTOPRAZOLE SODIUM 40 MG PO TBEC
40.0000 mg | DELAYED_RELEASE_TABLET | Freq: Every day | ORAL | Status: DC
  Administered 2022-05-17 – 2022-05-18 (×2): 40 mg via ORAL
  Filled 2022-05-17 (×2): qty 1

## 2022-05-17 NOTE — Care Management Important Message (Signed)
Important Message  Patient Details  Name: Oscar Rose MRN: [id number] Date of Birth: 14-Jan-1961   Medicare Important Message Given:  Yes     Tommy Medal 05/17/2022, 2:49 PM

## 2022-05-17 NOTE — Plan of Care (Signed)
  Problem: Acute Rehab PT Goals(only PT should resolve) Goal: Pt Will Go Supine/Side To Sit Flowsheets (Taken 05/17/2022 1156) Pt will go Supine/Side to Sit: with modified independence Goal: Patient Will Transfer Sit To/From Stand Flowsheets (Taken 05/17/2022 1156) Patient will transfer sit to/from stand: with modified independence Goal: Pt Will Transfer Bed To Chair/Chair To Bed Flowsheets (Taken 05/17/2022 1156) Pt will Transfer Bed to Chair/Chair to Bed:  with modified independence  with supervision Goal: Pt Will Ambulate Flowsheets (Taken 05/17/2022 1156) Pt will Ambulate:  100 feet  with rolling walker  with supervision   Zigmund Gottron, SPT

## 2022-05-17 NOTE — NC FL2 (Signed)
[place] LEVEL OF CARE SCREENING TOOL     IDENTIFICATION  Patient Name: Oscar Rose Birthdate: September 01, 1960 Sex: male Admission Date (Current Location): 05/12/2022  [facility] and [place] Number:  [employer] and Address:  [place] [address], [place]      Provider Number: [id number]  Attending Physician Name and Address:  Deatra James, MD  Relative Name and Phone Number:       Current Level of Care: Hospital Recommended Level of Care: [place] Prior Approval Number:    Date Approved/Denied:   PASRR Number:    Discharge Plan: SNF    Current Diagnoses: Patient Active Problem List   Diagnosis Date Noted   Sepsis due to multifocal pneumonia with acute hypoxic respiratory failure 05/14/2022   AKI (acute kidney injury) superimposed on CKD 3B 05/14/2022   CKD (chronic kidney disease) stage 3B 05/14/2022   Acute hypoxemic respiratory failure due to bilateral multifocal pneumonia 05/12/2022   Acute hypoxic respiratory failure due to bilateral multifocal pneumonia 05/12/2022   BPPV (benign paroxysmal positional vertigo) 04/30/2017   Carotid aneurysm, 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    CKD (chronic kidney disease), stage II    Renal stone 08/16/2014   Precordial pain 03/23/2014   Impingement syndrome of shoulder 02/02/2013    Orientation RESPIRATION BLADDER Height & Weight     Self, Time, Situation, Place  O2 (see dc summary) Continent Weight: 243 lb 2.7 oz (110.3 kg) Height:  5\' 11"  (180.3 cm)  BEHAVIORAL SYMPTOMS/MOOD NEUROLOGICAL BOWEL NUTRITION STATUS      Continent Diet (see  dc summary)  AMBULATORY STATUS COMMUNICATION OF NEEDS Skin   Independent Verbally Normal                       Personal Care Assistance Level of Assistance  Bathing, Feeding, Dressing Bathing Assistance: Limited assistance Feeding assistance: Independent Dressing Assistance: Limited assistance     Functional Limitations Info  Sight, Hearing, Speech Sight Info: Impaired Hearing Info: Adequate Speech Info: Adequate    SPECIAL CARE FACTORS FREQUENCY  PT (By licensed PT), OT (By licensed OT)     PT Frequency: 5x week OT Frequency: 3x week            Contractures Contractures Info: Not present    Additional Factors Info  Code Status, Allergies Code Status Info: Full Allergies Info: NKA           Current Medications (05/17/2022):  This is the current hospital active medication list Current Facility-Administered Medications  Medication Dose Route Frequency Provider Last Rate Last Admin   0.9 %  sodium chloride infusion  250 mL Intravenous PRN Darrick Meigs, Marge Duncans, MD       albuterol (PROVENTIL) (2.5 MG/3ML) 0.083% nebulizer solution 2.5 mg  2.5 mg Nebulization Q2H PRN Emokpae, Courage, MD       amLODipine (NORVASC) tablet 10 mg  10 mg Oral Daily Oswald Hillock, MD   10 mg at 05/17/22 5852   aspirin EC tablet 81 mg  81 mg Oral Daily Oswald Hillock, MD   81 mg  at 05/17/22 0826   atorvastatin (LIPITOR) tablet 40 mg  40 mg Oral Daily Oswald Hillock, MD   40 mg at 05/17/22 0825   ceFEPIme (MAXIPIME) 2 g in sodium chloride 0.9 % 100 mL IVPB  2 g Intravenous Q12H Madueme, Elvira C, RPH 200 mL/hr at 05/17/22 0825 2 g at 05/17/22 0825   Chlorhexidine Gluconate Cloth 2 % PADS 6 each  6 each Topical Q0600 Oswald Hillock, MD   6 each at 05/17/22 0547   clopidogrel (PLAVIX) tablet 75 mg  75 mg Oral Daily Oswald Hillock, MD   75 mg at 05/17/22 0825   dextromethorphan-guaiFENesin ([place] DM) 30-600 MG per 12 hr tablet 1 tablet  1 tablet Oral BID Roxan Hockey, MD   1 tablet at 05/17/22 0826    enoxaparin (LOVENOX) injection 40 mg  40 mg Subcutaneous Q24H Emokpae, Courage, MD   40 mg at 05/16/22 2131   hydrALAZINE (APRESOLINE) tablet 50 mg  50 mg Oral Q8H Shahmehdi, Seyed A, MD   50 mg at 05/17/22 2979   icosapent Ethyl (VASCEPA) 1 g capsule 2 g  2 g Oral BID Oswald Hillock, MD   2 g at 05/17/22 0826   insulin aspart (novoLOG) injection 0-15 Units  0-15 Units Subcutaneous TID WC Oswald Hillock, MD   2 Units at 05/15/22 0757   insulin glargine-yfgn (SEMGLEE) injection 40 Units  40 Units Subcutaneous QHS Oswald Hillock, MD   40 Units at 05/16/22 2130   ipratropium-albuterol (DUONEB) 0.5-2.5 (3) MG/3ML nebulizer solution 3 mL  3 mL Nebulization Q6H PRN Emokpae, Courage, MD       labetalol (NORMODYNE) injection 10 mg  10 mg Intravenous Q4H PRN Emokpae, Courage, MD   10 mg at 05/16/22 1454   lactobacillus (FLORANEX/LACTINEX) granules 1 g  1 g Oral TID WC Shahmehdi, Seyed A, MD       pantoprazole (PROTONIX) EC tablet 40 mg  40 mg Oral Daily Shahmehdi, Seyed A, MD   40 mg at 05/17/22 0826   sodium chloride flush (NS) 0.9 % injection 3 mL  3 mL Intravenous Q12H Oswald Hillock, MD   3 mL at 05/17/22 0841   sodium chloride flush (NS) 0.9 % injection 3 mL  3 mL Intravenous PRN Oswald Hillock, MD       tamsulosin (FLOMAX) capsule 0.4 mg  0.4 mg Oral QPC supper Denton Brick, Courage, MD   0.4 mg at 05/16/22 1824   vitamin B-12 (CYANOCOBALAMIN) tablet 500 mcg  500 mcg Oral Daily Oswald Hillock, MD   500 mcg at 05/17/22 8921     Discharge Medications: Please see discharge summary for a list of discharge medications.  Relevant Imaging Results:  Relevant Lab Results:   Additional Information    Shade Flood, LCSW

## 2022-05-17 NOTE — Progress Notes (Signed)
Pt given Incentive Spirometer and instructed on use. Pt able to complete 1500 x 5 with good effort. Use did incite coughing, congested but non-productive. IS left on bedside table and pt instructed to use x5 each time a commercial came on TV. Pt stated understanding.

## 2022-05-17 NOTE — Progress Notes (Signed)
PROGRESS NOTE   Oscar Rose, is a 61 y.o. male, DOB - [birth year], WJX:[id number]  Admit date - 05/12/2022   Admitting Physician Oswald Hillock, MD  Outpatient Primary MD for the patient is Dione Housekeeper, MD  LOS - 5  Chief Complaint  Patient presents with   Shortness of Breath      Subjective: Oscar Rose the patient was seen and examined this morning, sitting up in chair, was able to ambulate with PT -Continue improving from hypoxia, down from 15 L to 8 L, this morning on 2 L  -Cultures remain negative to date, has been on IV manage antibiotics Vanco/cefepime Since 05/12/2022 Cultures remain negative  Improving sepsis physiology -was on high flow nasal cannula weaned down to 15 L 100% FiO2 from 35 L 100% FiO2  Brief Narrative:   Oscar Rose is a  61 y.o. 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, sleep apnea, gastroesophageal reflux disease and osteoarthritis admitted on 05/13/2019 with acute  hypoxic  respiratory failure secondary to multifocal pneumonia requiring BiPAP now and transition to high flow nasal cannula at 100% with 25 L    -Assessment and Plan:  1) sepsis with acute  hypoxic  respiratory failure due to bilateral multifocal pneumonia -Patient initially required BiPAP continuously -Weaned to high flow nasal cannula  -Continue improving hypoxia, down to 8 L of oxygen satting 99%>>> weaning down to room air  -was on high flow nasal cannula, was weaned down to 15 L 100% FiO2 from 35 L 100% FiO2 >>> 8 L, 2 L considering monitoring on room air  -CT chest consistent with multifocal pneumonia -COVID and flu negative - continue bronchodilators and  mucolytics -Pulmonary critical care consulted -continue to follow closely -Renal function precluded CTA study -VQ scan Negative for PE -ABG was consistent with hypoxemia -Procalcitonin 24.85, ESR 85 -Continue IV vancomycin and cefepime for multifocal pneumonia pending blood culture As cultures remain negative, considering discontinuing vancomycin  -Due to Severity of pneumonia check Legionella antigen -A.m. cortisol is NOT Low serum cortisol at 3:30 AM was 35.7  2)Sepsis secondary to multifocal pneumonia--POA -ESR is 85, procalcitonin 24.85, WBC 14 >> 13.4 >>> 10.7 Trending down procalcitonin level -Antibiotics and bronchodilators as above #1   3)AKI----acute kidney injury on CKD stage - 3B  - Creatinine on admission = 3.61  baseline creatinine = around 2.2   ,  -Creatinine peaked at 3.98 >>2.7, 2.5 with - renally adjust medications, avoid nephrotoxic agents / dehydration  / hypotension  4)DM2-A1c is 5.5 reflecting excellent diabetic control PTA -Continue Lantus insulin  Use Novolog/Humalog Sliding scale insulin with Accu-Cheks/Fingersticks as ordered   5)HTN----stable,  continue amlodipine  6)H/o PAD/prior Stroke--continue atorvastatin and Plavix for secondary prevention  7)GERD--continue Protonix  8)Acute on chronic anemia--baseline usually around 12 -Suspect chronic anemia of CKD -ESA/Procrit -No bleeding concerns -Query hemodilution from sepsis fluid Rx  9)Lt Foot infection/status post transmetatarsal amputation left foot--imaging studies without osteo---POA -Antibiotics as above #1 -Patient with underlying peripheral artery disease -Continue Lipitor and Plavix  10)Acute  urinary retention--- patient failed voiding trial on 05/14/2022 -Required in and out catheterization x3 after Foley was removed on 05/14/2022 -Foley to be replaced on 05/15/2022 >> attempting to discontinue Foley catheter today 05/17/2022 -Continue Flomax as  ordered  11)HFrEF--EF 45 to 50% as  per echo from 05/13/2022, left ventricular hypokinesis including hypokinesis of the apical anterior wall inferior anterior inferior lateral and lateral walls noted -No aortic stenosis no mitral stenosis -BNP 643 -Chest x-ray and CT chest suggest pulmonary venous congestion/pulmonary edema -IV Lasix as ordered -Fluid intake and output charting requested   Disposition/Need for in-Hospital Stay- patient unable to be discharged at this time due to --acute hypoxic respiratory failure in the setting of sepsis sec to Multi-focal pneumonia requiring IV antibiotics and high flow oxygen  Status is: Inpatient   Disposition: The patient is from: SNF [facility]              Anticipated d/c is to: SNF              Anticipated d/c date is: in 2-3 days               Patient currently is not medically stable to d/c. Barriers: Not Clinically Stable-   Code Status :  -  Code Status: Full Code   Family Communication:    NA (patient is alert, awake and coherent)   DVT Prophylaxis  :   - SCDs  enoxaparin (LOVENOX) injection 40 mg Start: 05/14/22 2100   Lab Results  Component Value Date   PLT 281 05/16/2022   Inpatient Medications  Scheduled Meds:  amLODipine  10 mg Oral Daily   aspirin EC  81 mg Oral Daily   atorvastatin  40 mg Oral Daily   Chlorhexidine Gluconate Cloth  6 each Topical Q0600   clopidogrel  75 mg Oral Daily   dextromethorphan-guaiFENesin  1 tablet Oral BID   enoxaparin (LOVENOX) injection  40 mg Subcutaneous Q24H   hydrALAZINE  50 mg Oral Q8H   icosapent Ethyl  2 g Oral BID   insulin aspart  0-15 Units Subcutaneous TID WC   insulin glargine-yfgn  40 Units Subcutaneous QHS   lactobacillus  1 g Oral TID WC   pantoprazole  40 mg Oral Daily   sodium chloride flush  3 mL Intravenous Q12H   tamsulosin  0.4 mg Oral QPC supper   cyanocobalamin  500 mcg Oral Daily   Continuous Infusions:  sodium chloride     ceFEPime (MAXIPIME) IV 2 g (05/17/22 0825)   PRN Meds:.sodium  chloride, albuterol, ipratropium-albuterol, labetalol, sodium chloride flush   Anti-infectives (From admission, onward)    Start     Dose/Rate Route Frequency Ordered Stop   05/16/22 1000  vancomycin (VANCOREADY) IVPB 1750 mg/350 mL  Status:  Discontinued        1,750 mg 175 mL/hr over 120 Minutes Intravenous Every 48 hours 05/15/22 1318 05/16/22 1019   05/15/22 1330  ceFEPIme (MAXIPIME) 2 g in sodium chloride 0.9 % 100 mL IVPB       Note to Pharmacy: Cefepime 2 g IV q24h for CrCl < 30 mL/min   2 g 200 mL/hr over 30 Minutes Intravenous Every 12 hours 05/15/22 1255     05/14/22 1000  vancomycin (VANCOREADY) IVPB 1500 mg/300 mL  Status:  Discontinued        1,500 mg 150 mL/hr over 120 Minutes Intravenous Every 48 hours 05/12/22 1026 05/15/22 1318   05/13/22 0100  ceFEPIme (MAXIPIME) 2 g in sodium chloride 0.9 % 100 mL IVPB  Status:  Discontinued       Note to Pharmacy: Cefepime 2 g IV q24h for CrCl < 30 mL/min   2 g 200 mL/hr over  30 Minutes Intravenous Daily at bedtime 05/13/22 0041 05/15/22 1255   05/12/22 1000  vancomycin (VANCOREADY) IVPB 2000 mg/400 mL        2,000 mg 200 mL/hr over 120 Minutes Intravenous  Once 05/12/22 0950 05/12/22 1230         Objective: Vitals:   05/17/22 0800 05/17/22 0826 05/17/22 0900 05/17/22 1003  BP: (!) 170/51 (!) 170/51  (!) 131/59  Pulse: 85  98 81  Resp: 17  (!) 22 20  Temp:    98.2 F (36.8 C)  TempSrc:    Oral  SpO2: 98%  100% 100%  Weight:      Height:        Intake/Output Summary (Last 24 hours) at 05/17/2022 1132 Last data filed at 05/17/2022 0841 Gross per 24 hour  Intake 3 ml  Output 3200 ml  Net -3197 ml   Filed Weights   05/14/22 0315 05/16/22 0500 05/17/22 0500  Weight: 115.7 kg 115.7 kg 110.3 kg        Physical Exam:   General:  AAO x 3,  cooperative, no distress;   HEENT:  Normocephalic, PERRL, otherwise with in Normal limits   Neuro:  CNII-XII intact. , normal motor and sensation, reflexes intact   Lungs:    Clear to auscultation BL, Respirations unlabored,  No wheezes / crackles  Cardio:    S1/S2, RRR, No murmure, No Rubs or Gallops   Abdomen:  Soft, non-tender, bowel sounds active all four quadrants, no guarding or peritoneal signs.  Muscular  skeletal:  Limited exam -global generalized weaknesses - in bed, able to move all 4 extremities,   Heft foot with transmetatarsal amputation-some erythema mild swelling noted, right foot with prior big toe amputation 2+ pulses,  symmetric, No pitting edema  Skin:  Dry, warm to touch, negative for any Rashes,  Wounds: Please see nursing documentation  GU-Foley catheter replaced on 05/15/2022 due to urinary retention>>> planning to discontinue today 05/17/2022  Data Reviewed: I have personally reviewed following labs and imaging studies  CBC: Recent Labs  Lab 05/12/22 0927 05/13/22 0126 05/14/22 0324 05/15/22 0340 05/16/22 0414  WBC 15.6* 14.6* 14.5* 13.4* 10.7*  NEUTROABS 14.7*  --   --   --  8.7*  HGB 10.0* 9.2* 8.6* 8.7* 9.0*  HCT 29.0* 26.3* 25.0* 25.4* 25.8*  MCV 87.6 86.2 87.4 87.3 86.9  PLT 217 203 197 229 277   Basic Metabolic Panel: Recent Labs  Lab 05/12/22 0927 05/13/22 0126 05/14/22 0324 05/15/22 0340 05/16/22 0414 05/17/22 0802  NA 127*  --  130* 133* 135 134*  K 4.3  --  3.7 3.5 3.3* 3.4*  CL 93*  --  98 100 102 101  CO2 21*  --  _0 GLUCOSE 230*  --  140* 133* 102* 76  BUN 49*  --  63* 62* 55* 53*  CREATININE 3.61* 3.98* 3.26* 2.83* 2.70* 2.58*  CALCIUM 7.9*  --  8.0* 8.3* 8.4* 8.3*  PHOS  --   --   --  3.2 3.2  --    GFR: Estimated Creatinine Clearance: 38.9 mL/min (A) (by C-G formula based on SCr of 2.58 mg/dL (H)). Liver Function Tests: Recent Labs  Lab 05/12/22 0927 05/15/22 0340 05/16/22 0414  AST 40  --   --   ALT 19  --   --   ALKPHOS 65  --   --   BILITOT 1.4*  --   --   PROT 6.7  --   --  ALBUMIN 3.1* 2.6* 2.7*   HbA1C: No results for input(s): "HGBA1C" in the last 72  hours.   Recent Results (from the past 240 hour(s))  Urine Culture     Status: None   Collection Time: 05/12/22  9:27 AM   Specimen: In/Out Cath Urine  Result Value Ref Range Status   Specimen Description   Final    IN/OUT CATH URINE Performed at [facility], [address] [place], [place] [address]    Special Requests   Final    NONE Performed at [facility], [address], [place], [place] [address]    Culture   Final    NO GROWTH Performed at [facility], [place] [address], [place], [place] [address]    Report Status 05/14/2022 FINAL  Final  Blood Culture (routine x 2)     Status: None   Collection Time: 05/12/22  9:30 AM   Specimen: BLOOD LEFT FOREARM  Result Value Ref Range Status   Specimen Description BLOOD LEFT FOREARM  Final   Special Requests NONE  Final   Culture   Final    NO GROWTH 5 DAYS Performed at [facility], [address], [place],  [address]    Report Status 05/17/2022 FINAL  Final  Resp Panel by RT-PCR (Flu A&B, Covid) Anterior Nasal Swab     Status: None   Collection Time: 05/12/22  9:37 AM   Specimen: Anterior Nasal Swab  Result Value Ref Range Status   SARS Coronavirus 2 by RT PCR NEGATIVE NEGATIVE Final    Comment: (NOTE) SARS-CoV-2 target nucleic acids are NOT DETECTED.  The SARS-CoV-2 RNA is generally detectable in upper respiratory specimens during the acute phase of infection. The lowest concentration of SARS-CoV-2 viral copies this assay can detect is 138 copies/mL. A negative result does not preclude SARS-Cov-2 infection and should not be used as the sole basis for treatment or other patient management decisions. A negative result may occur with  improper specimen collection/handling, submission of specimen other than nasopharyngeal swab, presence of viral mutation(s) within the areas targeted by this assay, and inadequate number of viral copies(<138 copies/mL). A negative result must be combined with clinical  observations, patient history, and epidemiological information. The expected result is Negative.  Fact Sheet for Patients:  EntrepreneurPulse.com.au  Fact Sheet for Healthcare Providers:  IncredibleEmployment.be  This test is no t yet approved or cleared by the [place] FDA and  has been authorized for detection and/or diagnosis of SARS-CoV-2 by FDA under an Emergency Use Authorization (EUA). This EUA will remain  in effect (meaning this test can be used) for the duration of the COVID-19 declaration under Section 564(b)(1) of the Act, 21 U.S.C.section 360bbb-3(b)(1), unless the authorization is terminated  or revoked sooner.       Influenza A by PCR NEGATIVE NEGATIVE Final   Influenza B by PCR NEGATIVE NEGATIVE Final    Comment: (NOTE) The Xpert Xpress SARS-CoV-2/FLU/RSV plus assay is intended as an aid in the diagnosis of influenza from Nasopharyngeal swab specimens and should not be used as a sole basis for treatment. Nasal washings and aspirates are unacceptable for Xpert Xpress SARS-CoV-2/FLU/RSV testing.  Fact Sheet for Patients: EntrepreneurPulse.com.au  Fact Sheet for Healthcare Providers: IncredibleEmployment.be  This test is not yet approved or cleared by the [place] FDA and has been authorized for detection and/or diagnosis of SARS-CoV-2 by FDA under an Emergency Use Authorization (EUA). This EUA will remain in effect (meaning this test can be  used) for the duration of the COVID-19 declaration under Section 564(b)(1) of the Act, 21 U.S.C. section 360bbb-3(b)(1), unless the authorization is terminated or revoked.  Performed at [facility], [address], [place], [place] [address]   Blood Culture (routine x 2)     Status: None   Collection Time: 05/12/22 10:33 AM   Specimen: BLOOD  Result Value Ref Range Status   Specimen Description   Final    BLOOD LEFT ANTECUBITAL Performed  at [facility], [place] [address], [place], [place] [address]    Special Requests   Final    BOTTLES DRAWN AEROBIC AND ANAEROBIC Blood Culture adequate volume Performed at [facility], [place] [address], [place], [place] [address]    Culture   Final    NO GROWTH 5 DAYS Performed at [facility], [address] [place], [place] [address]    Report Status 05/17/2022 FINAL  Final  MRSA Next Gen by PCR, Nasal     Status: None   Collection Time: 05/13/22  9:53 AM   Specimen: Nasal Mucosa; Nasal Swab  Result Value Ref Range Status   MRSA by PCR Next Gen NOT DETECTED NOT DETECTED Final    Comment: (NOTE) The GeneXpert MRSA Assay (FDA approved for NASAL specimens only), is one component of a comprehensive MRSA colonization surveillance program. It is not intended to diagnose MRSA infection nor to guide or monitor treatment for MRSA infections. Test performance is not FDA approved in patients less than 18 years old. Performed at [facility], [address], [place],  [address]       Radiology Studies: DG Chest 2 View  Result Date: 05/16/2022 CLINICAL DATA:  Dyspnea, respiratory abnormalities EXAM: CHEST - 2 VIEW COMPARISON:  05/14/2022 FINDINGS: Upper normal size of cardiac silhouette. Mediastinal contours normal. Hazy airspace infiltrates greater on LEFT, slightly improved from previous exam. No pleural effusion or pneumothorax. Osseous structures unremarkable. IMPRESSION: Improved airspace infiltrates. Electronically Signed   By: Lavonia Dana M.D.   On: 05/16/2022 08:55    Scheduled Meds:  amLODipine  10 mg Oral Daily   aspirin EC  81 mg Oral Daily   atorvastatin  40 mg Oral Daily   Chlorhexidine Gluconate Cloth  6 each Topical Q0600   clopidogrel  75 mg Oral Daily   dextromethorphan-guaiFENesin  1 tablet Oral BID   enoxaparin (LOVENOX) injection  40 mg Subcutaneous Q24H   hydrALAZINE  50 mg Oral Q8H   icosapent Ethyl  2 g Oral  BID   insulin aspart  0-15 Units Subcutaneous TID WC   insulin glargine-yfgn  40 Units Subcutaneous QHS   lactobacillus  1 g Oral TID WC   pantoprazole  40 mg Oral Daily   sodium chloride flush  3 mL Intravenous Q12H   tamsulosin  0.4 mg Oral QPC supper   cyanocobalamin  500 mcg Oral Daily   Continuous Infusions:  sodium chloride     ceFEPime (MAXIPIME) IV 2 g (05/17/22 0825)    LOS: 5 days   Deatra James M.D on 05/17/2022 at 11:32 AM  Go to [email or web address] - for contact info  [facility] - Office  [phone]  If 7PM-7AM, please contact night-coverage [email or web address] 05/17/2022, 11:32 AM

## 2022-05-17 NOTE — TOC Progression Note (Signed)
Transition of Care [facility]) - Progression Note    Patient Details  Name: Oscar Rose MRN: [id number] Date of Birth: 1961-07-26  [facility] CM/SW Contact  Shade Flood, LCSW Phone Number: 05/17/2022, 10:53 AM  Clinical Narrative:     TOC following. MD anticipating dc Sunday. Updated Melissa at [facility]. She can be reached on Sunday at [phone] to coordinate dc.  Expected Discharge Plan: Long Term Nursing Home Barriers to Discharge: Continued Medical Work up  Expected Discharge Plan and Services Expected Discharge Plan: [place] In-house Referral: Clinical Social Work     Living arrangements for the past 2 months: [place]                                       Social Determinants of Health (SDOH) Interventions    Readmission Risk Interventions     No data to display

## 2022-05-17 NOTE — Evaluation (Signed)
Physical Therapy Evaluation Patient Details Name: Oscar Rose MRN: [id number] DOB: 1961/04/20 Today's Date: 05/17/2022  History of Present Illness  Oscar Rose  is a 61 y.o. 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 He was hospitalized at [facility] with osteomyelitis of left foot admitted on 09/25/2021 and subsequently discharged to SNF on 10/05/2021. During that admission he had left-sided transmetatarsal amputation.  Patient is followed by [facility] wound care clinic.  Was recently seen at wound care clinic on 05/02/2022.  He was seen by [facility] cardiology on 05/07/2022 for possible CHF and was ruled out of CHF.  As per note from cardiology his echocardiogram showed preserved ejection fraction.   Clinical Impression  Patient with slightly labored movement for sitting up at bedside. Patient able to sit to stand without assist and transfer to chair with [facility] and contact guard assist. Patient transferred to bathroom in room with SPC, switched to RW for hallway ambulation due to patient unsteadiness and holding onto furniture with other hand, demonstrating good carryover and increased safety/stability. Patient tolerated sitting up in chair after therapy. Patient will benefit from continued skilled physical therapy in hospital and recommended venue below to increase strength, balance, endurance for safe ADLs and gait.      Recommendations for follow up therapy are one component of a multi-disciplinary discharge planning process, led by the attending physician.  Recommendations may be updated based on patient status, additional functional  criteria and insurance authorization.  Follow Up Recommendations Skilled nursing-short term rehab (<3 hours/day) Can patient physically be transported by private vehicle: Yes    Assistance Recommended at Discharge Set up Supervision/Assistance  Patient can return home with the following  A little help with walking and/or transfers;A little help with bathing/dressing/bathroom;Two people to help with bathing/dressing/bathroom;Help with stairs or ramp for entrance    Equipment Recommendations None recommended by PT  Recommendations for Other Services       Functional Status Assessment Patient has had a recent decline in their functional status and demonstrates the ability to make significant improvements in function in a reasonable and predictable amount of time.     Precautions / Restrictions Precautions Precautions: Fall Restrictions Weight Bearing Restrictions: No      Mobility  Bed Mobility Overal bed mobility: Needs Assistance Bed Mobility: Supine to Sit     Supine to sit: Min assist, HOB elevated     General bed mobility comments: hand held assist for elevating trunk, able to scoot to EOB independently    Transfers Overall transfer level: Needs assistance Equipment used: Straight cane Transfers: Sit to/from Stand, Bed to chair/wheelchair/BSC Sit to Stand: Min guard   Step pivot transfers: Min guard       General transfer comment: patient able to sit to stand mod I with increased time, uses cane to transfer to chair, unsteady on feet    Ambulation/Gait Ambulation/Gait assistance: Min guard Gait Distance (Feet): 50 Feet Assistive device: Rolling walker (2 wheels), Straight cane Gait Pattern/deviations: Decreased step length - right, Decreased step length - left, Decreased stride length Gait velocity: decreased     General Gait Details: patient unsteady on feet with SPC, holding onto things with other hand during ambulation, switched to rolling walker with  patient demonstrating increased stability, safety and and fluidity of movement  Stairs            Wheelchair Mobility    Modified Rankin (Stroke Patients Only)       Balance Overall balance assessment: Needs assistance Sitting-balance support: Bilateral upper extremity supported, Feet supported Sitting balance-Leahy Scale: Good Sitting balance - Comments: seated EOB   Standing balance support: During functional activity, Reliant on assistive device for balance, Single extremity supported, Bilateral upper extremity supported Standing balance-Leahy Scale: Fair Standing balance comment: fair with SPC, good with RW                             Pertinent Vitals/Pain Pain Assessment Pain Assessment: 0-10 Pain Score: 3  Pain Location: right groin area/adductors Pain Descriptors / Indicators: Aching, Sore, Grimacing, Guarding Pain Intervention(s): Limited activity within patient's tolerance, Monitored during session, Repositioned    Home Living Family/patient expects to be discharged to:: Skilled nursing facility                 Home Equipment: Shower seat - built Medical sales representative (2 wheels);Cane - single point Additional Comments: patient coming from SNF    Prior Function Prior Level of Function : Independent/Modified Independent             Mobility Comments: patient reports he was a short distance community ambulator with [facility] ADLs Comments: Independent     Hand Dominance   Dominant Hand: Right    Extremity/Trunk Assessment   Upper Extremity Assessment Upper Extremity Assessment: Generalized weakness    Lower Extremity Assessment Lower Extremity Assessment: Generalized weakness    Cervical / Trunk Assessment Cervical / Trunk Assessment: Normal  Communication   Communication: No difficulties  Cognition Arousal/Alertness: Awake/alert Behavior During Therapy: WFL for tasks assessed/performed Overall Cognitive Status: Within Functional  Limits for tasks assessed                                          General Comments      Exercises     Assessment/Plan    PT Assessment Patient needs continued PT services  PT Problem List Decreased strength;Decreased activity tolerance;Decreased balance;Decreased mobility       PT Treatment Interventions DME instruction;Gait training;Stair training;Balance training;Patient/family education;Functional mobility training;Therapeutic activities;Therapeutic exercise    PT Goals (Current goals can be found in the Care Plan section)  Acute Rehab PT Goals Patient Stated Goal: return home after rehab PT Goal Formulation: With patient Time For Goal Achievement: 05/24/22 Potential to Achieve Goals: Good    Frequency Min 3X/week     Co-evaluation               AM-PAC PT "6 Clicks" Mobility  Outcome Measure Help needed turning from your back to your side while in a flat bed without using bedrails?: None Help needed moving from lying on your back to sitting on the side of a flat bed without using bedrails?: A Little Help needed moving to and from a bed to a chair (including a wheelchair)?: A Little Help needed standing up from a chair using your arms (e.g., wheelchair or bedside chair)?: A Little Help needed to walk in hospital room?: A Little Help needed climbing 3-5 steps with a railing? : A Lot 6 Click Score: 18    End of Session Equipment Utilized During  Treatment: Gait belt Activity Tolerance: Patient tolerated treatment well;Patient limited by fatigue Patient left: in chair;with call bell/phone within reach Nurse Communication: Mobility status PT Visit Diagnosis: Unsteadiness on feet (R26.81);Other abnormalities of gait and mobility (R26.89);Muscle weakness (generalized) (M62.81)    Time: 2010-0712 PT Time Calculation (min) (ACUTE ONLY): 31 min   Charges:   PT Evaluation $PT Eval Moderate Complexity: 1 Mod PT Treatments $Therapeutic Activity:  23-37 mins        Zigmund Gottron, SPT

## 2022-05-18 DIAGNOSIS — A419 Sepsis, unspecified organism: Secondary | ICD-10-CM | POA: Diagnosis not present

## 2022-05-18 DIAGNOSIS — J189 Pneumonia, unspecified organism: Secondary | ICD-10-CM | POA: Diagnosis not present

## 2022-05-18 LAB — GLUCOSE, CAPILLARY: Glucose-Capillary: 101 mg/dL — ABNORMAL HIGH (ref 70–99)

## 2022-05-18 LAB — BASIC METABOLIC PANEL
Anion gap: 9 (ref 5–15)
BUN: 53 mg/dL — ABNORMAL HIGH (ref 6–20)
CO2: 23 mmol/L (ref 22–32)
Calcium: 8.3 mg/dL — ABNORMAL LOW (ref 8.9–10.3)
Chloride: 101 mmol/L (ref 98–111)
Creatinine, Ser: 2.56 mg/dL — ABNORMAL HIGH (ref 0.61–1.24)
GFR, Estimated: 28 mL/min — ABNORMAL LOW (ref >60)
Glucose, Bld: 140 mg/dL — ABNORMAL HIGH (ref 70–99)
Potassium: 3.3 mmol/L — ABNORMAL LOW (ref 3.5–5.1)
Sodium: 133 mmol/L — ABNORMAL LOW (ref 135–145)

## 2022-05-18 LAB — CBC
HCT: 25.7 % — ABNORMAL LOW (ref 39.0–52.0)
Hemoglobin: 8.8 g/dL — ABNORMAL LOW (ref 13.0–17.0)
MCH: 29.9 pg (ref 26.0–34.0)
MCHC: 34.2 g/dL (ref 30.0–36.0)
MCV: 87.4 fL (ref 80.0–100.0)
Platelets: 359 10*3/uL (ref 150–400)
RBC: 2.94 MIL/uL — ABNORMAL LOW (ref 4.22–5.81)
RDW: 12.2 % (ref 11.5–15.5)
WBC: 11.1 10*3/uL — ABNORMAL HIGH (ref 4.0–10.5)
nRBC: 0 % (ref 0.0–0.2)

## 2022-05-18 MED ORDER — HYDRALAZINE HCL 100 MG PO TABS: 100.0000 mg | ORAL_TABLET | Freq: Three times a day (TID) | ORAL | 0 refills | Status: DC

## 2022-05-18 MED ORDER — TAMSULOSIN HCL 0.4 MG PO CAPS: 0.4000 mg | ORAL_CAPSULE | Freq: Every day | ORAL | 1 refills | Status: AC

## 2022-05-18 MED ORDER — FLORANEX PO PACK: 1.0000 g | PACK | Freq: Three times a day (TID) | ORAL | 0 refills | Status: AC

## 2022-05-18 MED ORDER — GUAIFENESIN-DM 100-10 MG/5ML PO SYRP: 10.0000 mL | ORAL_SOLUTION | Freq: Three times a day (TID) | ORAL | 0 refills | Status: AC

## 2022-05-18 MED ORDER — HYDRALAZINE HCL 25 MG PO TABS
100.0000 mg | ORAL_TABLET | Freq: Three times a day (TID) | ORAL | Status: DC
  Administered 2022-05-18: 100 mg via ORAL
  Filled 2022-05-18: qty 4

## 2022-05-18 MED ORDER — LEVOFLOXACIN 750 MG PO TABS: 750.0000 mg | ORAL_TABLET | Freq: Every day | ORAL | 0 refills | Status: AC

## 2022-05-18 NOTE — Discharge Summary (Signed)
Physician Discharge Summary   [name] MRN: [id number] DOB: [birth year]  Admit date:     05/12/2022  Discharge date: 05/18/22  Discharge Physician: Deatra James   PCP: Dione Housekeeper, MD   Recommendations at discharge:  Aggressive PT OT, fall precautions,.  Ambulate much as possible. Follow with the PCP in 1 week. Needed referral by PCP to urologist for difficulty urinating, urinary retention. Continue using your flutter valve/inspirometer while awake.   Discharge Diagnoses: Principal Problem:   Sepsis due to multifocal pneumonia with acute hypoxic respiratory failure Active Problems:   Acute hypoxemic respiratory failure due to bilateral multifocal pneumonia   Acute hypoxic respiratory failure due to bilateral multifocal pneumonia   AKI (acute kidney injury) superimposed on CKD 3B   CKD (chronic kidney disease) stage 3B   Type 2 diabetes mellitus ([place])  Resolved Problems:   * No resolved hospital problems. *  Hospital Course:    Brief Narrative:   Oscar Rose is a  61 y.o. 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, sleep apnea, gastroesophageal reflux disease and osteoarthritis admitted on 05/13/2019 with acute  hypoxic  respiratory failure secondary to multifocal pneumonia requiring BiPAP now and transition to high flow nasal cannula at 100% with 25 L   Overall was admitted due to sepsis, acute respiratory failure likely due to multiple focal pneumonia Initially was admitted to ICU for acute respiratory failure on 15 L oxygen, status post aggressive treatment with IV fluid, IV antibiotics, DuoNeb bronchodilators,--- patient subsequently improved,  weaned off oxygen, to room air, also had an AKI, steadily improving, and urinary retention Foley catheter was placed with subsequent discontinued.  Was assessed for global generalized weaknesses due to severely illnesses.  Currently hemodynamically stable, has been cleared for discharge to return back to SNF, and continue current recommended medication, antibiotics, aggressive PT OT, fall precautions,  Follow-up with PCP and urologist within 1-2 weeks  ---------------------------------------------------------------------------------------------------------------------------------------------------  -Assessment and Plan:   1) sepsis with acute  hypoxic  respiratory failure due to bilateral multifocal pneumonia -Resolved, patient is back to baseline, on room air now    -Improved hypoxia, down to 8 L of oxygen satting 99%>>> weaning down to room air   -was on high flow nasal cannula, was weaned down to 15 L 100% FiO2 from 35 L 100% FiO2 >>> 8 L, 2 L considering monitoring on room air   -CT chest consistent with multifocal pneumonia -COVID and flu negative - continue bronchodilators and mucolytics -Pulmonary critical care consulted -continue to follow closely -Renal function precluded CTA study -VQ scan Negative for PE -ABG was consistent with hypoxemia -Procalcitonin 24.85, ESR 85 -Continue IV vancomycin and cefepime for multifocal pneumonia pending blood culture As cultures remain negative, considering discontinuing vancomycin   -Due to Severity of pneumonia check Legionella antigen-not resulted to date -A.m. cortisol is NOT Low serum cortisol at 3:30 AM was 35.7   2)Sepsis secondary to multifocal pneumonia--POA -ESR is 85, procalcitonin 24.85, WBC 14 >> 13.4 >>> 10.7 Trending down procalcitonin level -Antibiotics and bronchodilators as above #1     3)AKI----acute kidney injury on CKD stage - 3B  - Creatinine on admission = 3.61  baseline creatinine = around 2.2   ,  -Creatinine  peaked at 3.98 >>  2.7, 2.5  - renally adjust medications, avoid nephrotoxic agents / dehydration  / hypotension   4)DM2-A1c is 5.5 reflecting excellent diabetic control PTA -Continue Lantus insulin  Use Novolog/Humalog Sliding scale insulin with Accu-Cheks/Fingersticks as ordered    5)HTN----stable,  continue amlodipine   6)H/o PAD/prior Stroke--continue atorvastatin and Plavix for secondary prevention   7)GERD--continue Protonix   8)Acute on chronic anemia--baseline usually around 12 -Suspect chronic anemia of CKD -ESA/Procrit    9)Lt Foot infection/status post transmetatarsal amputation left foot--imaging studies without osteo---POA -Antibiotics as above #1 -Patient with underlying peripheral artery disease -Continue Lipitor and Plavix   10)Acute  urinary retention--- patient failed voiding trial on 05/14/2022 -Required in and out catheterization x3 after Foley was removed on 05/14/2022 -Foley to be replaced on 05/15/2022 >> discontinue Foley catheter 05/17/2022 -Continue Flomax as ordered -Need to follow-up with urologist as an outpatient   11)HFrEF--EF 45 to 50% as per echo from 05/13/2022, left ventricular hypokinesis including hypokinesis of the apical anterior wall inferior anterior inferior lateral and lateral walls noted -No aortic stenosis no mitral stenosis -BNP 643 -Chest x-ray and CT chest suggest pulmonary venous congestion/pulmonary edema -IV Lasix x 1       Status is: Inpatient    Disposition: The patient is from: SNF [facility]              Anticipated d/c is to: SNF    Code Status :  -  Code Status: Full Code     Disposition: Nursing home Diet recommendation:  Discharge Diet Orders (From admission, onward)     Start     Ordered   05/18/22 0000  Diet - low sodium heart healthy        05/18/22 1118           Cardiac and Carb modified diet DISCHARGE MEDICATION: Allergies as of 05/18/2022   No Known Allergies      Medication List      STOP taking these medications    CIPROFLOXACIN IV   losartan 50 MG tablet Commonly known as: COZAAR       TAKE these medications    amLODipine 10 MG tablet Commonly known as: NORVASC Take 10 mg by mouth daily.   ascorbic acid 500 MG tablet Commonly known as: VITAMIN C Take 1,000 mg by mouth 2 (two) times daily.   aspirin EC 81 MG tablet Take 81 mg by mouth daily.   atorvastatin 40 MG tablet Commonly known as: LIPITOR Take 40 mg by mouth daily.   Cholecalciferol 1.25 MG (50000 UT) Tabs Take 1.25 mg by mouth once a week. Mondays   Cholecalciferol 100 MCG (4000 UT) Tabs Take 1 tablet by mouth daily.   clopidogrel 75 MG tablet Commonly known as: Plavix Take 1 tablet (75 mg total) by mouth daily.   cyanocobalamin 500 MCG tablet Commonly known as: VITAMIN B12 Take 500 mcg by mouth daily.   ferrous sulfate 325 (65 FE) MG tablet Take 325 mg by mouth daily with breakfast.   guaiFENesin-dextromethorphan 100-10 MG/5ML syrup Commonly known as: ROBITUSSIN DM Take 10 mLs by mouth every 8 (eight) hours for 5 days.   hydrALAZINE 100 MG tablet Commonly known as: APRESOLINE Take 1 tablet (100 mg total) by mouth every 8 (eight) hours. What changed:  medication strength how much to take when to take this   icosapent Ethyl 1 g capsule Commonly known as: VASCEPA Take 2 g by mouth 2 (two) times daily.   insulin glargine 100 UNIT/ML injection  Commonly known as: LANTUS Inject 40 Units into the skin at bedtime.   lactobacillus Pack Take 1 packet (1 g total) by mouth 3 (three) times daily with meals for 5 days.   levofloxacin 750 MG tablet Commonly known as: Levaquin Take 1 tablet (750 mg total) by mouth daily for 7 days.   multivitamin tablet Take 1 tablet by mouth daily.   Omega-3 1000 MG Caps Take 1 capsule by mouth every morning.   OXYGEN Inhale 4 L into the lungs as needed (hypoxia).   OYSTER SHELL CALCIUM PO Take 500 mg by mouth daily.   PRO-STAT  PO Take 30 mLs by mouth in the morning and at bedtime.   tamsulosin 0.4 MG Caps capsule Commonly known as: FLOMAX Take 1 capsule (0.4 mg total) by mouth daily after supper.               Discharge Care Instructions  (From admission, onward)           Start     Ordered   05/18/22 0000  Discharge wound care:       Comments: Per RN wound instructions   05/18/22 1118            Discharge Exam: Filed Weights   05/16/22 0500 05/17/22 0500 05/18/22 0625  Weight: 115.7 kg 110.3 kg 110.5 kg      Physical Exam:   General:  AAO x 3,  cooperative, no distress;   HEENT:  Normocephalic, PERRL, otherwise with in Normal limits   Neuro:  CNII-XII intact. , normal motor and sensation, reflexes intact   Lungs:   Clear to auscultation BL, Respirations unlabored,  No wheezes / crackles  Cardio:    S1/S2, RRR, No murmure, No Rubs or Gallops   Abdomen:  Soft, non-tender, bowel sounds active all four quadrants, no guarding or peritoneal signs.  Muscular  skeletal:  Limited exam -global generalized weaknesses - in bed, able to move all 4 extremities,   2+ pulses,  symmetric, No pitting edema  Skin:  Dry, warm to touch, negative for any Rashes,  Wounds: Please see nursing documentation      Condition at discharge: fair  The results of significant diagnostics from this hospitalization (including imaging, microbiology, ancillary and laboratory) are listed below for reference.   Imaging Studies: DG Chest 2 View  Result Date: 05/16/2022 CLINICAL DATA:  Dyspnea, respiratory abnormalities EXAM: CHEST - 2 VIEW COMPARISON:  05/14/2022 FINDINGS: Upper normal size of cardiac silhouette. Mediastinal contours normal. Hazy airspace infiltrates greater on LEFT, slightly improved from previous exam. No pleural effusion or pneumothorax. Osseous structures unremarkable. IMPRESSION: Improved airspace infiltrates. Electronically Signed   By: Lavonia Dana M.D.   On: 05/16/2022 08:55   CT CHEST  WO CONTRAST  Result Date: 05/14/2022 CLINICAL DATA:  Chronic dyspnea, hypoxia EXAM: CT CHEST WITHOUT CONTRAST TECHNIQUE: Multidetector CT imaging of the chest was performed following the standard protocol without IV contrast. RADIATION DOSE REDUCTION: This exam was performed according to the departmental dose-optimization program which includes automated exposure control, adjustment of the mA and/or kV according to patient size and/or use of iterative reconstruction technique. COMPARISON:  Previous studies including chest radiographs done on 05/14/2022 FINDINGS: Cardiovascular: Coronary artery calcifications are seen. Small pericardial effusion is present. There is ectasia of main pulmonary artery measuring 3.9 cm suggesting pulmonary arterial hypertension. Mediastinum/Nodes: There are slightly enlarged lymph nodes in the mediastinum each measuring less than 10 mm in short axis. Lungs/Pleura: Small to moderate bilateral pleural effusions  are seen, more so on the right side. There extensive patchy alveolar infiltrates in both lungs, more so on the left side. There are faint ground-glass densities in the mid and lower lung fields. There is no pneumothorax. Upper Abdomen: Spleen is enlarged measuring up to 15.8 cm in diameter. There is subtle increased density in the dependent portion of gallbladder. There is no wall thickening in gallbladder. Musculoskeletal: No acute findings are seen. IMPRESSION: Extensive patchy alveolar and ground-glass infiltrates are seen in both lungs, more so on the left side suggesting multifocal pneumonia. Part of this finding may suggest underlying pulmonary edema. Small to moderate bilateral pleural effusions are seen. Coronary artery disease. There is ectasia of main pulmonary artery suggesting pulmonary arterial hypertension. Small pericardial effusion. There is subtle increased density in the lumen of gallbladder suggesting presence of sludge and possibly tiny stones. Enlarged  spleen. Electronically Signed   By: Elmer Picker M.D.   On: 05/14/2022 12:33   DG Chest 2 View  Result Date: 05/14/2022 CLINICAL DATA:  Dyspnea, low oxygen saturation EXAM: CHEST - 2 VIEW COMPARISON:  05/12/2022 FINDINGS: Upper normal size cardiac silhouette. Mediastinal contours normal. Patchy airspace infiltrates bilaterally question multifocal pneumonia less likely asymmetric edema. No pleural effusion or pneumothorax. IMPRESSION: Patchy BILATERAL pulmonary infiltrates favoring multifocal pneumonia over pulmonary edema. Electronically Signed   By: Lavonia Dana M.D.   On: 05/14/2022 12:16   NM Pulmonary Perfusion  Result Date: 05/14/2022 CLINICAL DATA:  Dyspnea, shortness of breath and chest pain EXAM: NUCLEAR MEDICINE PERFUSION LUNG SCAN TECHNIQUE: Perfusion images were obtained in multiple projections after intravenous injection of radiopharmaceutical. Ventilation scans intentionally deferred if perfusion scan and chest x-ray adequate for interpretation during COVID 19 epidemic. RADIOPHARMACEUTICALS:  3.8 mCi Tc-58mMAA IV COMPARISON:  None Radiographic correlation: Chest radiographs 05/14/2022 FINDINGS: Normal perfusion lung scan. No perfusion defects. IMPRESSION: Normal perfusion lung scan. Electronically Signed   By: MLavonia DanaM.D.   On: 05/14/2022 12:13   ECHOCARDIOGRAM COMPLETE  Result Date: 05/13/2022    ECHOCARDIOGRAM REPORT   Patient Name:   CADRAIN NESBITDate of Exam: 05/13/2022 Medical Rec #:  [id number]     Height:       71.0 in Accession #:    [id number]    Weight:       253.7 lb Date of Birth:  603-01-1961      BSA:          2.333 m Patient Age:    89years       BP:           141/52 mmHg Patient Gender: M              HR:           99 bpm. Exam [facility]  [facility]cedure: 2D Echo, Cardiac Doppler, Color Doppler and Intracardiac            Opacification Agent Indications:    I50.40* Unspecified combined systolic (congestive) and diastolic                 (congestive) heart  failure  History:        Patient has prior history of Echocardiogram examinations, most                 recent 02/15/2016. Stroke, Signs/Symptoms:Chest Pain and Dyspnea;                 Risk Factors:Hypertension and Diabetes.  Sonographer:  Roseanna Rainbow RDCS Referring Phys: Earnie Larsson LAMA  Sonographer Comments: Technically difficult study due to poor echo windows. Image acquisition challenging due to patient body habitus. IMPRESSIONS  1. Left ventricular ejection fraction, by estimation, is 45 to 50%. The left ventricle has mildly decreased function. The left ventricle demonstrates regional wall motion abnormalities (see scoring diagram/findings for description). There is mild concentric left ventricular hypertrophy. Left ventricular diastolic parameters are indeterminate. Elevated left ventricular end-diastolic pressure. There is hypokinesis of the left ventricular, apical anterior wall, inferior wall, lateral wall, inferolateral wall and apical segment.  2. Right ventricular systolic function is normal. The right ventricular size is normal. Tricuspid regurgitation signal is inadequate for assessing PA pressure.  3. The mitral valve is normal in structure. Trivial mitral valve regurgitation. No evidence of mitral stenosis.  4. The aortic valve is tricuspid. Aortic valve regurgitation is not visualized. Aortic valve sclerosis/calcification is present, without any evidence of aortic stenosis.  5. There is mild dilatation of the aortic root, measuring 41 mm. There is mild dilatation of the ascending aorta, measuring 39 mm.  6. The inferior vena cava is normal in size with greater than 50% respiratory variability, suggesting right atrial pressure of 3 mmHg. FINDINGS  Left Ventricle: Left ventricular ejection fraction, by estimation, is 45 to 50%. The left ventricle has mildly decreased function. The left ventricle demonstrates regional wall motion abnormalities. Definity contrast agent was given IV to delineate the  left ventricular endocardial borders. The left ventricular internal cavity size was normal in size. There is mild concentric left ventricular hypertrophy. Left ventricular diastolic parameters are indeterminate. Elevated left ventricular end-diastolic pressure. Right Ventricle: The right ventricular size is normal. No increase in right ventricular wall thickness. Right ventricular systolic function is normal. Tricuspid regurgitation signal is inadequate for assessing PA pressure. Left Atrium: Left atrial size was normal in size. Right Atrium: Right atrial size was normal in size. Pericardium: There is no evidence of pericardial effusion. Mitral Valve: The mitral valve is normal in structure. Trivial mitral valve regurgitation. No evidence of mitral valve stenosis. Tricuspid Valve: The tricuspid valve is normal in structure. Tricuspid valve regurgitation is trivial. No evidence of tricuspid stenosis. Aortic Valve: The aortic valve is tricuspid. Aortic valve regurgitation is not visualized. Aortic valve sclerosis/calcification is present, without any evidence of aortic stenosis. Pulmonic Valve: The pulmonic valve was normal in structure. Pulmonic valve regurgitation is not visualized. No evidence of pulmonic stenosis. Aorta: The aortic root is normal in size and structure. There is mild dilatation of the aortic root, measuring 41 mm. There is mild dilatation of the ascending aorta, measuring 39 mm. Venous: The inferior vena cava is normal in size with greater than 50% respiratory variability, suggesting right atrial pressure of 3 mmHg. IAS/Shunts: There is redundancy of the interatrial septum. No atrial level shunt detected by color flow Doppler.  LEFT VENTRICLE PLAX 2D LVIDd:         5.10 cm      Diastology LVIDs:         3.60 cm      LV e' medial:    3.05 cm/s LV PW:         1.20 cm      LV E/e' medial:  32.4 LV IVS:        1.30 cm      LV e' lateral:   9.46 cm/s LVOT diam:     2.50 cm      LV E/e' lateral: 10.5 LV  SV:         94 LV SV Index:   40 LVOT Area:     4.91 cm  LV Volumes (MOD) LV vol d, MOD A2C: 126.0 ml LV vol d, MOD A4C: 117.0 ml LV vol s, MOD A2C: 64.3 ml LV vol s, MOD A4C: 68.2 ml LV SV MOD A2C:     61.7 ml LV SV MOD A4C:     117.0 ml LV SV MOD BP:      62.6 ml RIGHT VENTRICLE             IVC RV S prime:     10.10 cm/s  IVC diam: 2.30 cm TAPSE (M-mode): 1.9 cm LEFT ATRIUM             Index        RIGHT ATRIUM           Index LA diam:        3.90 cm 1.67 cm/m   RA Area:     17.80 cm LA Vol (A2C):   53.9 ml 23.10 ml/m  RA Volume:   48.80 ml  20.92 ml/m LA Vol (A4C):   70.5 ml 30.22 ml/m LA Biplane Vol: 60.0 ml 25.72 ml/m  AORTIC VALVE LVOT Vmax:   110.00 cm/s LVOT Vmean:  74.800 cm/s LVOT VTI:    0.191 m  AORTA Ao Root diam: 4.10 cm Ao Asc diam:  3.90 cm MITRAL VALVE MV Area (PHT): 4.09 cm    SHUNTS MV Decel Time: 186 msec    Systemic VTI:  0.19 m MV E velocity: 98.87 cm/s  Systemic Diam: 2.50 cm MV A velocity: 94.10 cm/s MV E/A ratio:  1.05 Fransico Him MD Electronically signed by Fransico Him MD Signature Date/Time: 05/13/2022/2:56:10 PM    Final    DG Foot 2 Views Left  Result Date: 05/12/2022 CLINICAL DATA:  Amputation for osteomyelitis. EXAM: LEFT FOOT - 2 VIEW COMPARISON:  09/25/2021. FINDINGS: Since the previous exam, there has been a wide forefoot amputation. The great toe is been resected as has the second toe and a small portion of the second metatarsal head. Distal aspects of the third and fourth metatarsals have been resected along with the third and fourth toes. Fifth metatarsal has been resected from the midshaft distally including the fifth toe. Resected margins are well-defined. No areas of bone resorption to suggest osteomyelitis. There is surrounding forefoot soft tissue swelling. No soft tissue air. IMPRESSION: 1. Forefoot amputations as detailed with surrounding soft tissue swelling, but no evidence of osteomyelitis. Electronically Signed   By: Lajean Manes M.D.   On: 05/12/2022  10:09   DG Chest Port 1 View  Result Date: 05/12/2022 CLINICAL DATA:  Shortness of breath, cough, questionable sepsis EXAM: PORTABLE CHEST 1 VIEW COMPARISON:  Chest x-ray October 05, 2021 FINDINGS: Stable cardiomediastinal contours. Diffuse bilateral interstitial and heterogeneous pulmonary opacities. No large pleural effusion or pneumothorax. No acute osseous abnormality. The visualized upper abdomen is unremarkable. IMPRESSION: Diffuse interstitial and heterogeneous pulmonary opacities. Differential considerations include infectious or inflammatory etiology or pulmonary edema. Electronically Signed   By: Beryle Flock M.D.   On: 05/12/2022 09:48    Microbiology: Results for orders placed or performed during the hospital encounter of 05/12/22  Urine Culture     Status: None   Collection Time: 05/12/22  9:27 AM   Specimen: In/Out Cath Urine  Result Value Ref Range Status   Specimen Description   Final    IN/OUT CATH URINE  Performed at [facility], [address], [place], [place] [address]    Special Requests   Final    NONE Performed at [facility], [address], [place], [place] [address]    Culture   Final    NO GROWTH Performed at [facility], [place] [address], [place], [place] [address]    Report Status 05/14/2022 FINAL  Final  Blood Culture (routine x 2)     Status: None   Collection Time: 05/12/22  9:30 AM   Specimen: BLOOD LEFT FOREARM  Result Value Ref Range Status   Specimen Description BLOOD LEFT FOREARM  Final   Special Requests NONE  Final   Culture   Final    NO GROWTH 5 DAYS Performed at [facility], [address], [place], [place] [address]    Report Status 05/17/2022 FINAL  Final  Resp Panel by RT-PCR (Flu A&B, Covid) Anterior Nasal Swab     Status: None   Collection Time: 05/12/22  9:37 AM   Specimen: Anterior Nasal Swab  Result Value Ref Range Status   SARS Coronavirus 2 by RT PCR NEGATIVE NEGATIVE Final    Comment: (NOTE) SARS-CoV-2 target nucleic  acids are NOT DETECTED.  The SARS-CoV-2 RNA is generally detectable in upper respiratory specimens during the acute phase of infection. The lowest concentration of SARS-CoV-2 viral copies this assay can detect is 138 copies/mL. A negative result does not preclude SARS-Cov-2 infection and should not be used as the sole basis for treatment or other patient management decisions. A negative result may occur with  improper specimen collection/handling, submission of specimen other than nasopharyngeal swab, presence of viral mutation(s) within the areas targeted by this assay, and inadequate number of viral copies(<138 copies/mL). A negative result must be combined with clinical observations, patient history, and epidemiological information. The expected result is Negative.  Fact Sheet for Patients:  EntrepreneurPulse.com.au  Fact Sheet for Healthcare Providers:  IncredibleEmployment.be  This test is no t yet approved or cleared by the [place] FDA and  has been authorized for detection and/or diagnosis of SARS-CoV-2 by FDA under an Emergency Use Authorization (EUA). This EUA will remain  in effect (meaning this test can be used) for the duration of the COVID-19 declaration under Section 564(b)(1) of the Act, 21 U.S.C.section 360bbb-3(b)(1), unless the authorization is terminated  or revoked sooner.       Influenza A by PCR NEGATIVE NEGATIVE Final   Influenza B by PCR NEGATIVE NEGATIVE Final    Comment: (NOTE) The Xpert Xpress SARS-CoV-2/FLU/RSV plus assay is intended as an aid in the diagnosis of influenza from Nasopharyngeal swab specimens and should not be used as a sole basis for treatment. Nasal washings and aspirates are unacceptable for Xpert Xpress SARS-CoV-2/FLU/RSV testing.  Fact Sheet for Patients: EntrepreneurPulse.com.au  Fact Sheet for Healthcare Providers: IncredibleEmployment.be  This  test is not yet approved or cleared by the [place] FDA and has been authorized for detection and/or diagnosis of SARS-CoV-2 by FDA under an Emergency Use Authorization (EUA). This EUA will remain in effect (meaning this test can be used) for the duration of the COVID-19 declaration under Section 564(b)(1) of the Act, 21 U.S.C. section 360bbb-3(b)(1), unless the authorization is terminated or revoked.  Performed at [facility], [address] [place], [place] [address]   Blood Culture (routine x 2)     Status: None   Collection Time: 05/12/22 10:33 AM   Specimen: BLOOD  Result Value Ref Range Status  Specimen Description   Final    BLOOD LEFT ANTECUBITAL Performed at [facility], [place] [address], [place], [place] [address]    Special Requests   Final    BOTTLES DRAWN AEROBIC AND ANAEROBIC Blood Culture adequate volume Performed at [facility] [facility], [place] [address], [place], [place] [address]    Culture   Final    NO GROWTH 5 DAYS Performed at [facility], [address], [place], [place] [address]    Report Status 05/17/2022 FINAL  Final  MRSA Next Gen by PCR, Nasal     Status: None   Collection Time: 05/13/22  9:53 AM   Specimen: Nasal Mucosa; Nasal Swab  Result Value Ref Range Status   MRSA by PCR Next Gen NOT DETECTED NOT DETECTED Final    Comment: (NOTE) The GeneXpert MRSA Assay (FDA approved for NASAL specimens only), is one component of a comprehensive MRSA colonization surveillance program. It is not intended to diagnose MRSA infection nor to guide or monitor treatment for MRSA infections. Test performance is not FDA approved in patients less than 22 years old. Performed at [facility], [address] [place],  [address]     Labs: CBC: Recent Labs  Lab 05/12/22 [id number] 05/13/22 0126 05/14/22 0324 05/15/22 0340 05/16/22 0414 05/18/22 0337  WBC 15.6* 14.6* 14.5* 13.4* 10.7* 11.1*  NEUTROABS 14.7*  --    --   --  8.7*  --   HGB 10.0* 9.2* 8.6* 8.7* 9.0* 8.8*  HCT 29.0* 26.3* 25.0* 25.4* 25.8* 25.7*  MCV 87.6 86.2 87.4 87.3 86.9 87.4  PLT 217 203 197 229 281 378   Basic Metabolic Panel: Recent Labs  Lab 05/14/22 0324 05/15/22 0340 05/16/22 0414 05/17/22 0802 05/18/22 0337  NA 130* 133* 135 134* 133*  K 3.7 3.5 3.3* 3.4* 3.3*  CL 98 100 102 101 101  CO2 _0 GLUCOSE 140* 133* 102* 76 140*  BUN 63* 62* 55* 53* 53*  CREATININE 3.26* 2.83* 2.70* 2.58* 2.56*  CALCIUM 8.0* 8.3* 8.4* 8.3* 8.3*  PHOS  --  3.2 3.2  --   --    Liver Function Tests: Recent Labs  Lab 05/12/22 0927 05/15/22 0340 05/16/22 0414  AST 40  --   --   ALT 19  --   --   ALKPHOS 65  --   --   BILITOT 1.4*  --   --   PROT 6.7  --   --   ALBUMIN 3.1* 2.6* 2.7*   CBG: Recent Labs  Lab 05/17/22 0721 05/17/22 1102 05/17/22 1613 05/17/22 2102 05/18/22 0755  GLUCAP 72 100* 137* 135* 101*    Discharge time spent: greater than 30 minutes.  Signed: Deatra James, MD [facility] 05/18/2022

## 2022-05-18 NOTE — Progress Notes (Addendum)
Pharmacy Antibiotic Note  Oscar Rose is a 61 y.o. NH patient admitted on 05/12/2022 with multi-focal PNA and possible left foot cellulitis (hx PVD, bil. Toe amputation, osteo in february).  Pharmacy assisting with cefepime dosing. Renal function improving and returning close to baseline, will adjust antibiotics accordingly.   Plan: Cefepime 2gm q 12h  Height: 5\' 11"  (180.3 cm) Weight: 110.5 kg (243 lb 9.7 oz) IBW/kg (Calculated) : 75.3  Temp (24hrs), Avg:98.2 F (36.8 C), Min:98 F (36.7 C), Max:98.4 F (36.9 C)  Recent Labs  Lab 05/12/22 0927 05/12/22 1120 05/13/22 0126 05/14/22 0324 05/15/22 0340 05/16/22 0414 05/17/22 0802 05/18/22 0337  WBC 15.6*  --  14.6* 14.5* 13.4* 10.7*  --  11.1*  CREATININE 3.61*  --  3.98* 3.26* 2.83* 2.70* 2.58* 2.56*  LATICACIDVEN 1.9 1.4  --   --   --   --   --   --      Estimated Creatinine Clearance: 39.3 mL/min (A) (by C-G formula based on SCr of 2.56 mg/dL (H)).    No Known Allergies  Antimicrobials this admission: 10/8 Vanc>>10/8 10/9 Cefepime>>  Microbiology results: 10/8 BCx: NGTD 10/8 UCx: NG 10/9 MRSA PCR: negative  Thank you for allowing pharmacy to be a part of this patient's care.  Oscar Rose Oscar Rose 05/18/2022 8:18 AM

## 2022-05-18 NOTE — Progress Notes (Signed)
Attempted to call report twice to [place] with no answer. Will continue to try.

## 2022-05-18 NOTE — Progress Notes (Signed)
Nsg Discharge Note  Admit Date:  05/12/2022 Discharge date: 05/18/2022   Oscar Rose to be D/C'd Skilled nursing facility per MD order.  AVS completed.   Patient/caregiver able to verbalize understanding.  Discharge Medication: Allergies as of 05/18/2022   No Known Allergies      Medication List     STOP taking these medications    CIPROFLOXACIN IV   losartan 50 MG tablet Commonly known as: COZAAR       TAKE these medications    amLODipine 10 MG tablet Commonly known as: NORVASC Take 10 mg by mouth daily.   ascorbic acid 500 MG tablet Commonly known as: VITAMIN C Take 1,000 mg by mouth 2 (two) times daily.   aspirin EC 81 MG tablet Take 81 mg by mouth daily.   atorvastatin 40 MG tablet Commonly known as: LIPITOR Take 40 mg by mouth daily.   Cholecalciferol 1.25 MG (50000 UT) Tabs Take 1.25 mg by mouth once a week. Mondays   Cholecalciferol 100 MCG (4000 UT) Tabs Take 1 tablet by mouth daily.   clopidogrel 75 MG tablet Commonly known as: Plavix Take 1 tablet (75 mg total) by mouth daily.   cyanocobalamin 500 MCG tablet Commonly known as: VITAMIN B12 Take 500 mcg by mouth daily.   ferrous sulfate 325 (65 FE) MG tablet Take 325 mg by mouth daily with breakfast.   guaiFENesin-dextromethorphan 100-10 MG/5ML syrup Commonly known as: ROBITUSSIN DM Take 10 mLs by mouth every 8 (eight) hours for 5 days.   hydrALAZINE 100 MG tablet Commonly known as: APRESOLINE Take 1 tablet (100 mg total) by mouth every 8 (eight) hours. What changed:  medication strength how much to take when to take this   icosapent Ethyl 1 g capsule Commonly known as: VASCEPA Take 2 g by mouth 2 (two) times daily.   insulin glargine 100 UNIT/ML injection Commonly known as: LANTUS Inject 40 Units into the skin at bedtime.   lactobacillus Pack Take 1 packet (1 g total) by mouth 3 (three) times daily with meals for 5 days.   levofloxacin 750 MG tablet Commonly known as:  Levaquin Take 1 tablet (750 mg total) by mouth daily for 7 days.   multivitamin tablet Take 1 tablet by mouth daily.   Omega-3 1000 MG Caps Take 1 capsule by mouth every morning.   OXYGEN Inhale 4 L into the lungs as needed (hypoxia).   OYSTER SHELL CALCIUM PO Take 500 mg by mouth daily.   PRO-STAT PO Take 30 mLs by mouth in the morning and at bedtime.   tamsulosin 0.4 MG Caps capsule Commonly known as: FLOMAX Take 1 capsule (0.4 mg total) by mouth daily after supper.               Discharge Care Instructions  (From admission, onward)           Start     Ordered   05/18/22 0000  Discharge wound care:       Comments: Per RN wound instructions   05/18/22 1118            Discharge Assessment: Vitals:   05/18/22 0118 05/18/22 0632  BP: (!) 150/54 (!) 171/75  Pulse: 91 89  Resp: 18 18  Temp: 98.4 F (36.9 C) 98 F (36.7 C)  SpO2: 95% 96%   Skin clean, dry and intact without evidence of skin break down, no evidence of skin tears noted. IV catheter discontinued intact. Site without signs and symptoms of complications -  no redness or edema noted at insertion site, patient denies c/o pain - only slight tenderness at site.  Dressing with slight pressure applied.  D/c Instructions-Education: Discharge instructions given to patient/family with verbalized understanding. D/c education completed with patient/family including follow up instructions, medication list, d/c activities limitations if indicated, with other d/c instructions as indicated by MD - patient able to verbalize understanding, all questions fully answered. Patient instructed to return to ED, call 911, or call MD for any changes in condition.  Patient escorted via [place], and D/C home via private auto.  Kathie Rhodes, RN 05/18/2022 12:01 PM

## 2022-05-18 NOTE — TOC Transition Note (Addendum)
Transition of Care [facility]) - CM/SW Discharge Note   Patient Details  Name: Oscar Rose MRN: [id number] Date of Birth: 04-01-1961  [facility] CM/SW Contact:  Shade Flood, LCSW Phone Number: 05/18/2022, 12:15 PM   Clinical Narrative:     Pt stable for dc back to [facility] today per MD. Dominica Severin at [facility]. DC clinical sent electronically. RN to call report. [place] to transport.  There are no other TOC needs for dc.  Final next level of care: [place] Barriers to Discharge: Barriers Resolved   Patient Goals and CMS Choice Patient states their goals for this hospitalization and ongoing recovery are:: return to [facility]      Discharge Placement                       Discharge Plan and Services In-house Referral: Clinical Social Work                                   Social Determinants of Health (SDOH) Interventions     Readmission Risk Interventions     No data to display

## 2022-05-19 LAB — GLUCOSE, CAPILLARY: Glucose-Capillary: 99 mg/dL (ref 70–99)

## 2022-05-29 ENCOUNTER — Ambulatory Visit: Payer: Medicare Other | Admitting: Urology

## 2022-06-05 ENCOUNTER — Other Ambulatory Visit: Payer: Self-pay

## 2022-06-05 ENCOUNTER — Other Ambulatory Visit (HOSPITAL_COMMUNITY): Payer: Self-pay | Admitting: Nephrology

## 2022-06-05 DIAGNOSIS — N170 Acute kidney failure with tubular necrosis: Secondary | ICD-10-CM

## 2022-06-05 DIAGNOSIS — I129 Hypertensive chronic kidney disease with stage 1 through stage 4 chronic kidney disease, or unspecified chronic kidney disease: Secondary | ICD-10-CM

## 2022-06-05 DIAGNOSIS — D638 Anemia in other chronic diseases classified elsewhere: Secondary | ICD-10-CM

## 2022-06-05 DIAGNOSIS — E1122 Type 2 diabetes mellitus with diabetic chronic kidney disease: Secondary | ICD-10-CM

## 2022-06-14 ENCOUNTER — Ambulatory Visit (HOSPITAL_COMMUNITY): Payer: Medicare Other

## 2022-06-19 ENCOUNTER — Ambulatory Visit: Payer: Medicare Other | Admitting: Vascular Surgery

## 2022-06-21 ENCOUNTER — Ambulatory Visit (HOSPITAL_COMMUNITY)
Admission: RE | Admit: 2022-06-21 | Discharge: 2022-06-21 | Disposition: A | Discharge status: Home / Self Care | Payer: No Typology Code available for payment source | Source: Ambulatory Visit | Attending: Nephrology | Admitting: Nephrology

## 2022-06-21 DIAGNOSIS — I129 Hypertensive chronic kidney disease with stage 1 through stage 4 chronic kidney disease, or unspecified chronic kidney disease: Secondary | ICD-10-CM | POA: Diagnosis present

## 2022-06-21 DIAGNOSIS — N170 Acute kidney failure with tubular necrosis: Secondary | ICD-10-CM

## 2022-06-21 DIAGNOSIS — E1122 Type 2 diabetes mellitus with diabetic chronic kidney disease: Secondary | ICD-10-CM

## 2022-06-21 DIAGNOSIS — D638 Anemia in other chronic diseases classified elsewhere: Secondary | ICD-10-CM

## 2022-06-29 ENCOUNTER — Emergency Department (HOSPITAL_COMMUNITY): Payer: Medicare Other

## 2022-06-29 ENCOUNTER — Other Ambulatory Visit: Payer: Self-pay

## 2022-06-29 ENCOUNTER — Inpatient Hospital Stay (HOSPITAL_COMMUNITY)
Admission: EM | Admit: 2022-06-29 | Discharge: 2022-07-16 | DRG: 207 | Disposition: A | Discharge status: Skilled Nursing Facility | Payer: Medicare Other | Source: Skilled Nursing Facility | Attending: Family Medicine | Admitting: Family Medicine

## 2022-06-29 DIAGNOSIS — J1000 Influenza due to other identified influenza virus with unspecified type of pneumonia: Secondary | ICD-10-CM | POA: Diagnosis present

## 2022-06-29 DIAGNOSIS — Z20822 Contact with and (suspected) exposure to covid-19: Secondary | ICD-10-CM | POA: Diagnosis present

## 2022-06-29 DIAGNOSIS — E1142 Type 2 diabetes mellitus with diabetic polyneuropathy: Secondary | ICD-10-CM | POA: Diagnosis present

## 2022-06-29 DIAGNOSIS — Z9981 Dependence on supplemental oxygen: Secondary | ICD-10-CM

## 2022-06-29 DIAGNOSIS — E1165 Type 2 diabetes mellitus with hyperglycemia: Secondary | ICD-10-CM | POA: Diagnosis present

## 2022-06-29 DIAGNOSIS — J189 Pneumonia, unspecified organism: Principal | ICD-10-CM | POA: Diagnosis present

## 2022-06-29 DIAGNOSIS — I5023 Acute on chronic systolic (congestive) heart failure: Secondary | ICD-10-CM | POA: Diagnosis present

## 2022-06-29 DIAGNOSIS — D6489 Other specified anemias: Secondary | ICD-10-CM | POA: Diagnosis present

## 2022-06-29 DIAGNOSIS — E875 Hyperkalemia: Secondary | ICD-10-CM | POA: Diagnosis present

## 2022-06-29 DIAGNOSIS — F32A Depression, unspecified: Secondary | ICD-10-CM | POA: Diagnosis present

## 2022-06-29 DIAGNOSIS — Z7189 Other specified counseling: Secondary | ICD-10-CM | POA: Diagnosis not present

## 2022-06-29 DIAGNOSIS — E669 Obesity, unspecified: Secondary | ICD-10-CM | POA: Diagnosis present

## 2022-06-29 DIAGNOSIS — Z7902 Long term (current) use of antithrombotics/antiplatelets: Secondary | ICD-10-CM

## 2022-06-29 DIAGNOSIS — Z79899 Other long term (current) drug therapy: Secondary | ICD-10-CM

## 2022-06-29 DIAGNOSIS — N179 Acute kidney failure, unspecified: Secondary | ICD-10-CM | POA: Diagnosis present

## 2022-06-29 DIAGNOSIS — Z89432 Acquired absence of left foot: Secondary | ICD-10-CM | POA: Diagnosis not present

## 2022-06-29 DIAGNOSIS — Z515 Encounter for palliative care: Secondary | ICD-10-CM | POA: Diagnosis not present

## 2022-06-29 DIAGNOSIS — F419 Anxiety disorder, unspecified: Secondary | ICD-10-CM | POA: Diagnosis present

## 2022-06-29 DIAGNOSIS — R7989 Other specified abnormal findings of blood chemistry: Secondary | ICD-10-CM | POA: Diagnosis not present

## 2022-06-29 DIAGNOSIS — J9601 Acute respiratory failure with hypoxia: Secondary | ICD-10-CM | POA: Diagnosis present

## 2022-06-29 DIAGNOSIS — N1832 Chronic kidney disease, stage 3b: Secondary | ICD-10-CM

## 2022-06-29 DIAGNOSIS — E872 Acidosis, unspecified: Secondary | ICD-10-CM | POA: Diagnosis present

## 2022-06-29 DIAGNOSIS — Y95 Nosocomial condition: Secondary | ICD-10-CM | POA: Diagnosis present

## 2022-06-29 DIAGNOSIS — J09X1 Influenza due to identified novel influenza A virus with pneumonia: Secondary | ICD-10-CM | POA: Diagnosis present

## 2022-06-29 DIAGNOSIS — L89152 Pressure ulcer of sacral region, stage 2: Secondary | ICD-10-CM | POA: Diagnosis not present

## 2022-06-29 DIAGNOSIS — G4700 Insomnia, unspecified: Secondary | ICD-10-CM | POA: Diagnosis present

## 2022-06-29 DIAGNOSIS — J1100 Influenza due to unidentified influenza virus with unspecified type of pneumonia: Secondary | ICD-10-CM | POA: Diagnosis not present

## 2022-06-29 DIAGNOSIS — E781 Pure hyperglyceridemia: Secondary | ICD-10-CM | POA: Diagnosis not present

## 2022-06-29 DIAGNOSIS — E1122 Type 2 diabetes mellitus with diabetic chronic kidney disease: Secondary | ICD-10-CM | POA: Diagnosis present

## 2022-06-29 DIAGNOSIS — N183 Chronic kidney disease, stage 3 unspecified: Secondary | ICD-10-CM | POA: Diagnosis present

## 2022-06-29 DIAGNOSIS — N184 Chronic kidney disease, stage 4 (severe): Secondary | ICD-10-CM | POA: Diagnosis present

## 2022-06-29 DIAGNOSIS — E559 Vitamin D deficiency, unspecified: Secondary | ICD-10-CM | POA: Diagnosis present

## 2022-06-29 DIAGNOSIS — G2581 Restless legs syndrome: Secondary | ICD-10-CM | POA: Diagnosis present

## 2022-06-29 DIAGNOSIS — J80 Acute respiratory distress syndrome: Secondary | ICD-10-CM | POA: Insufficient documentation

## 2022-06-29 DIAGNOSIS — R7889 Finding of other specified substances, not normally found in blood: Secondary | ICD-10-CM | POA: Diagnosis not present

## 2022-06-29 DIAGNOSIS — T380X5A Adverse effect of glucocorticoids and synthetic analogues, initial encounter: Secondary | ICD-10-CM | POA: Diagnosis present

## 2022-06-29 DIAGNOSIS — E119 Type 2 diabetes mellitus without complications: Secondary | ICD-10-CM

## 2022-06-29 DIAGNOSIS — Z794 Long term (current) use of insulin: Secondary | ICD-10-CM

## 2022-06-29 DIAGNOSIS — T502X5A Adverse effect of carbonic-anhydrase inhibitors, benzothiadiazides and other diuretics, initial encounter: Secondary | ICD-10-CM | POA: Diagnosis present

## 2022-06-29 DIAGNOSIS — I10 Essential (primary) hypertension: Secondary | ICD-10-CM | POA: Diagnosis not present

## 2022-06-29 DIAGNOSIS — H548 Legal blindness, as defined in USA: Secondary | ICD-10-CM | POA: Diagnosis present

## 2022-06-29 DIAGNOSIS — I2489 Other forms of acute ischemic heart disease: Secondary | ICD-10-CM | POA: Diagnosis present

## 2022-06-29 DIAGNOSIS — Z66 Do not resuscitate: Secondary | ICD-10-CM | POA: Diagnosis present

## 2022-06-29 DIAGNOSIS — R0603 Acute respiratory distress: Secondary | ICD-10-CM | POA: Diagnosis present

## 2022-06-29 DIAGNOSIS — Z89411 Acquired absence of right great toe: Secondary | ICD-10-CM | POA: Diagnosis not present

## 2022-06-29 DIAGNOSIS — I130 Hypertensive heart and chronic kidney disease with heart failure and stage 1 through stage 4 chronic kidney disease, or unspecified chronic kidney disease: Secondary | ICD-10-CM | POA: Diagnosis present

## 2022-06-29 DIAGNOSIS — Z6837 Body mass index (BMI) 37.0-37.9, adult: Secondary | ICD-10-CM

## 2022-06-29 DIAGNOSIS — E1151 Type 2 diabetes mellitus with diabetic peripheral angiopathy without gangrene: Secondary | ICD-10-CM | POA: Diagnosis present

## 2022-06-29 DIAGNOSIS — R339 Retention of urine, unspecified: Secondary | ICD-10-CM | POA: Diagnosis not present

## 2022-06-29 DIAGNOSIS — K219 Gastro-esophageal reflux disease without esophagitis: Secondary | ICD-10-CM | POA: Diagnosis present

## 2022-06-29 DIAGNOSIS — Z809 Family history of malignant neoplasm, unspecified: Secondary | ICD-10-CM

## 2022-06-29 DIAGNOSIS — E1159 Type 2 diabetes mellitus with other circulatory complications: Secondary | ICD-10-CM

## 2022-06-29 DIAGNOSIS — R451 Restlessness and agitation: Secondary | ICD-10-CM | POA: Diagnosis not present

## 2022-06-29 DIAGNOSIS — Z7982 Long term (current) use of aspirin: Secondary | ICD-10-CM

## 2022-06-29 DIAGNOSIS — F919 Conduct disorder, unspecified: Secondary | ICD-10-CM | POA: Diagnosis not present

## 2022-06-29 LAB — COMPREHENSIVE METABOLIC PANEL
ALT: 37 U/L (ref 0–44)
AST: 60 U/L — ABNORMAL HIGH (ref 15–41)
Albumin: 3.6 g/dL (ref 3.5–5.0)
Alkaline Phosphatase: 126 U/L (ref 38–126)
Anion gap: 14 (ref 5–15)
BUN: 33 mg/dL — ABNORMAL HIGH (ref 6–20)
CO2: 20 mmol/L — ABNORMAL LOW (ref 22–32)
Calcium: 8.7 mg/dL — ABNORMAL LOW (ref 8.9–10.3)
Chloride: 104 mmol/L (ref 98–111)
Creatinine, Ser: 2.55 mg/dL — ABNORMAL HIGH (ref 0.61–1.24)
GFR, Estimated: 28 mL/min — ABNORMAL LOW (ref >60)
Glucose, Bld: 221 mg/dL — ABNORMAL HIGH (ref 70–99)
Potassium: 4.1 mmol/L (ref 3.5–5.1)
Sodium: 138 mmol/L (ref 135–145)
Total Bilirubin: 0.8 mg/dL (ref 0.3–1.2)
Total Protein: 7.5 g/dL (ref 6.5–8.1)

## 2022-06-29 LAB — BRAIN NATRIURETIC PEPTIDE: B Natriuretic Peptide: 478 pg/mL — ABNORMAL HIGH (ref 0.0–100.0)

## 2022-06-29 LAB — CBC WITH DIFFERENTIAL/PLATELET
Abs Immature Granulocytes: 0.06 10*3/uL (ref 0.00–0.07)
Basophils Absolute: 0.1 10*3/uL (ref 0.0–0.1)
Basophils Relative: 1 %
Eosinophils Absolute: 0 10*3/uL (ref 0.0–0.5)
Eosinophils Relative: 0 %
HCT: 33.2 % — ABNORMAL LOW (ref 39.0–52.0)
Hemoglobin: 10.2 g/dL — ABNORMAL LOW (ref 13.0–17.0)
Immature Granulocytes: 1 %
Lymphocytes Relative: 12 %
Lymphs Abs: 1.2 10*3/uL (ref 0.7–4.0)
MCH: 29.2 pg (ref 26.0–34.0)
MCHC: 30.7 g/dL (ref 30.0–36.0)
MCV: 95.1 fL (ref 80.0–100.0)
Monocytes Absolute: 0.5 10*3/uL (ref 0.1–1.0)
Monocytes Relative: 5 %
Neutro Abs: 8.4 10*3/uL — ABNORMAL HIGH (ref 1.7–7.7)
Neutrophils Relative %: 81 %
Platelets: 303 10*3/uL (ref 150–400)
RBC: 3.49 MIL/uL — ABNORMAL LOW (ref 4.22–5.81)
RDW: 14 % (ref 11.5–15.5)
WBC: 10.2 10*3/uL (ref 4.0–10.5)
nRBC: 0 % (ref 0.0–0.2)

## 2022-06-29 LAB — URINALYSIS, ROUTINE W REFLEX MICROSCOPIC
Bilirubin Urine: NEGATIVE
Glucose, UA: NEGATIVE mg/dL
Hgb urine dipstick: NEGATIVE
Ketones, ur: NEGATIVE mg/dL
Leukocytes,Ua: NEGATIVE
Nitrite: NEGATIVE
Protein, ur: >=300 mg/dL — AB
Specific Gravity, Urine: 1.014 (ref 1.005–1.030)
pH: 5 (ref 5.0–8.0)

## 2022-06-29 LAB — TROPONIN I (HIGH SENSITIVITY)
Troponin I (High Sensitivity): 123 ng/L (ref <18)
Troponin I (High Sensitivity): 224 ng/L (ref <18)

## 2022-06-29 LAB — PHOSPHORUS
Phosphorus: 5.1 mg/dL — ABNORMAL HIGH (ref 2.5–4.6)
Phosphorus: 4.1 mg/dL (ref 2.5–4.6)
Phosphorus: 3.3 mg/dL (ref 2.5–4.6)

## 2022-06-29 LAB — BLOOD GAS, ARTERIAL
Acid-base deficit: 2.5 mmol/L — ABNORMAL HIGH (ref 0.0–2.0)
Bicarbonate: 21.7 mmol/L (ref 20.0–28.0)
Drawn by: 35043
FIO2: 100 %
O2 Saturation: 99.6 %
Patient temperature: 36.1
pCO2 arterial: 34 mmHg (ref 32–48)
pH, Arterial: 7.41 (ref 7.35–7.45)
pO2, Arterial: 138 mmHg — ABNORMAL HIGH (ref 83–108)

## 2022-06-29 LAB — GLUCOSE, CAPILLARY
Glucose-Capillary: 181 mg/dL — ABNORMAL HIGH (ref 70–99)
Glucose-Capillary: 147 mg/dL — ABNORMAL HIGH (ref 70–99)
Glucose-Capillary: 124 mg/dL — ABNORMAL HIGH (ref 70–99)
Glucose-Capillary: 134 mg/dL — ABNORMAL HIGH (ref 70–99)
Glucose-Capillary: 159 mg/dL — ABNORMAL HIGH (ref 70–99)

## 2022-06-29 LAB — RESP PANEL BY RT-PCR (FLU A&B, COVID) ARPGX2
Influenza A by PCR: POSITIVE — AB
Influenza B by PCR: NEGATIVE
SARS Coronavirus 2 by RT PCR: NEGATIVE

## 2022-06-29 LAB — TSH: TSH: 1.941 u[IU]/mL (ref 0.350–4.500)

## 2022-06-29 LAB — LACTIC ACID, PLASMA
Lactic Acid, Venous: 4 mmol/L (ref 0.5–1.9)
Lactic Acid, Venous: 1.8 mmol/L (ref 0.5–1.9)

## 2022-06-29 LAB — MRSA NEXT GEN BY PCR, NASAL: MRSA by PCR Next Gen: NOT DETECTED

## 2022-06-29 LAB — MAGNESIUM
Magnesium: 1.9 mg/dL (ref 1.7–2.4)
Magnesium: 1.5 mg/dL — ABNORMAL LOW (ref 1.7–2.4)
Magnesium: 1.5 mg/dL — ABNORMAL LOW (ref 1.7–2.4)

## 2022-06-29 LAB — HIV ANTIBODY (ROUTINE TESTING W REFLEX): HIV Screen 4th Generation wRfx: NONREACTIVE

## 2022-06-29 LAB — STREP PNEUMONIAE URINARY ANTIGEN: Strep Pneumo Urinary Antigen: NEGATIVE

## 2022-06-29 MED ORDER — ASPIRIN 81 MG PO CHEW
81.0000 mg | CHEWABLE_TABLET | Freq: Every day | ORAL | Status: DC
  Administered 2022-06-29 – 2022-07-09 (×11): 81 mg
  Filled 2022-06-29 (×11): qty 1

## 2022-06-29 MED ORDER — ATORVASTATIN CALCIUM 40 MG PO TABS
40.0000 mg | ORAL_TABLET | Freq: Every day | ORAL | Status: DC
  Administered 2022-06-29 – 2022-07-09 (×11): 40 mg
  Filled 2022-06-29 (×11): qty 1

## 2022-06-29 MED ORDER — BUDESONIDE 0.5 MG/2ML IN SUSP
RESPIRATORY_TRACT | Status: AC
  Filled 2022-06-29: qty 2

## 2022-06-29 MED ORDER — SODIUM CHLORIDE 0.9% FLUSH: 3.0000 mL | INTRAVENOUS | Status: DC | PRN

## 2022-06-29 MED ORDER — INSULIN ASPART 100 UNIT/ML IJ SOLN: 0.0000 [IU] | Freq: Every day | INTRAMUSCULAR | Status: DC

## 2022-06-29 MED ORDER — OSELTAMIVIR PHOSPHATE 6 MG/ML PO SUSR
30.0000 mg | Freq: Two times a day (BID) | ORAL | Status: AC
  Administered 2022-06-29 – 2022-07-03 (×10): 30 mg via ORAL
  Filled 2022-06-29 (×10): qty 12.5

## 2022-06-29 MED ORDER — ACETAMINOPHEN 325 MG PO TABS
650.0000 mg | ORAL_TABLET | Freq: Four times a day (QID) | ORAL | Status: DC | PRN
  Administered 2022-06-29: 650 mg via ORAL
  Filled 2022-06-29: qty 2

## 2022-06-29 MED ORDER — BUDESONIDE 0.5 MG/2ML IN SUSP
0.5000 mg | Freq: Two times a day (BID) | RESPIRATORY_TRACT | Status: DC
  Administered 2022-06-29 – 2022-07-01 (×5): 0.5 mg via RESPIRATORY_TRACT
  Filled 2022-06-29 (×4): qty 2

## 2022-06-29 MED ORDER — SODIUM CHLORIDE 0.9 % IV SOLN
2.0000 g | Freq: Once | INTRAVENOUS | Status: AC
  Administered 2022-06-29: 2 g via INTRAVENOUS
  Filled 2022-06-29: qty 12.5

## 2022-06-29 MED ORDER — PROPOFOL 1000 MG/100ML IV EMUL
5.0000 ug/kg/min | INTRAVENOUS | Status: DC
  Administered 2022-06-29: 20 ug/kg/min via INTRAVENOUS

## 2022-06-29 MED ORDER — IPRATROPIUM-ALBUTEROL 0.5-2.5 (3) MG/3ML IN SOLN
RESPIRATORY_TRACT | Status: AC
  Administered 2022-06-29: 3 mL
  Filled 2022-06-29: qty 3

## 2022-06-29 MED ORDER — POLYETHYLENE GLYCOL 3350 17 G PO PACK
17.0000 g | PACK | Freq: Every day | ORAL | Status: DC
  Administered 2022-06-29 – 2022-07-13 (×9): 17 g
  Filled 2022-06-29 (×11): qty 1

## 2022-06-29 MED ORDER — PROPOFOL 1000 MG/100ML IV EMUL
5.0000 ug/kg/min | INTRAVENOUS | Status: DC
  Administered 2022-06-29: 40 ug/kg/min via INTRAVENOUS
  Administered 2022-06-29: 35 ug/kg/min via INTRAVENOUS
  Administered 2022-06-30: 30 ug/kg/min via INTRAVENOUS
  Administered 2022-06-30 (×2): 60 ug/kg/min via INTRAVENOUS
  Administered 2022-06-30: 30 ug/kg/min via INTRAVENOUS
  Administered 2022-06-30 – 2022-07-01 (×3): 50 ug/kg/min via INTRAVENOUS
  Administered 2022-07-01: 60 ug/kg/min via INTRAVENOUS
  Administered 2022-07-01: 50 ug/kg/min via INTRAVENOUS
  Administered 2022-07-01 (×2): 60 ug/kg/min via INTRAVENOUS
  Administered 2022-07-01: 40 ug/kg/min via INTRAVENOUS
  Administered 2022-07-02: 50 ug/kg/min via INTRAVENOUS
  Administered 2022-07-02: 60 ug/kg/min via INTRAVENOUS
  Administered 2022-07-02 (×5): 50 ug/kg/min via INTRAVENOUS
  Administered 2022-07-03 (×6): 60 ug/kg/min via INTRAVENOUS
  Administered 2022-07-04 (×5): 70 ug/kg/min via INTRAVENOUS
  Administered 2022-07-05: 60 ug/kg/min via INTRAVENOUS
  Administered 2022-07-05: 70 ug/kg/min via INTRAVENOUS
  Administered 2022-07-05 – 2022-07-06 (×4): 60 ug/kg/min via INTRAVENOUS
  Administered 2022-07-06 (×2): 70 ug/kg/min via INTRAVENOUS
  Administered 2022-07-06: 60 ug/kg/min via INTRAVENOUS
  Filled 2022-06-29 (×17): qty 100
  Filled 2022-06-29: qty 200
  Filled 2022-06-29 (×13): qty 100
  Filled 2022-06-29: qty 200
  Filled 2022-06-29 (×19): qty 100

## 2022-06-29 MED ORDER — ORAL CARE MOUTH RINSE: 15.0000 mL | OROMUCOSAL | Status: DC | PRN

## 2022-06-29 MED ORDER — ASPIRIN 81 MG PO CHEW: 81.0000 mg | CHEWABLE_TABLET | Freq: Every day | ORAL | Status: DC

## 2022-06-29 MED ORDER — FUROSEMIDE 10 MG/ML IJ SOLN
20.0000 mg | Freq: Two times a day (BID) | INTRAMUSCULAR | Status: DC
  Administered 2022-06-29 – 2022-07-09 (×21): 20 mg via INTRAVENOUS
  Filled 2022-06-29 (×21): qty 2

## 2022-06-29 MED ORDER — VITAL 1.5 CAL PO LIQD
1000.0000 mL | ORAL | Status: DC
  Administered 2022-06-29 – 2022-07-08 (×7): 1000 mL
  Filled 2022-06-29 (×15): qty 1000

## 2022-06-29 MED ORDER — DOCUSATE SODIUM 50 MG/5ML PO LIQD
100.0000 mg | Freq: Two times a day (BID) | ORAL | Status: DC
  Administered 2022-06-29 – 2022-07-02 (×8): 100 mg
  Filled 2022-06-29 (×9): qty 10

## 2022-06-29 MED ORDER — SODIUM CHLORIDE 0.9% FLUSH
3.0000 mL | Freq: Two times a day (BID) | INTRAVENOUS | Status: DC
  Administered 2022-06-29 – 2022-07-14 (×31): 3 mL via INTRAVENOUS

## 2022-06-29 MED ORDER — ONDANSETRON HCL 4 MG/2ML IJ SOLN: 4.0000 mg | Freq: Four times a day (QID) | INTRAMUSCULAR | Status: DC | PRN

## 2022-06-29 MED ORDER — ENOXAPARIN SODIUM 60 MG/0.6ML IJ SOSY
50.0000 mg | PREFILLED_SYRINGE | INTRAMUSCULAR | Status: DC
  Administered 2022-06-29: 50 mg via SUBCUTANEOUS
  Filled 2022-06-29: qty 0.6

## 2022-06-29 MED ORDER — SODIUM CHLORIDE 0.9 % IV SOLN: 250.0000 mL | INTRAVENOUS | Status: DC | PRN

## 2022-06-29 MED ORDER — INSULIN GLARGINE-YFGN 100 UNIT/ML ~~LOC~~ SOLN
10.0000 [IU] | Freq: Every day | SUBCUTANEOUS | Status: DC
  Administered 2022-06-29 – 2022-07-07 (×9): 10 [IU] via SUBCUTANEOUS
  Filled 2022-06-29 (×10): qty 0.1

## 2022-06-29 MED ORDER — ATORVASTATIN CALCIUM 40 MG PO TABS: 40.0000 mg | ORAL_TABLET | Freq: Every day | ORAL | Status: DC

## 2022-06-29 MED ORDER — SODIUM CHLORIDE 0.9 % IV SOLN: 1.0000 g | Freq: Once | INTRAVENOUS | Status: DC

## 2022-06-29 MED ORDER — ACETAMINOPHEN 650 MG RE SUPP: 650.0000 mg | Freq: Four times a day (QID) | RECTAL | Status: DC | PRN

## 2022-06-29 MED ORDER — ETOMIDATE 2 MG/ML IV SOLN
20.0000 mg | Freq: Once | INTRAVENOUS | Status: AC
  Administered 2022-06-29: 20 mg via INTRAVENOUS

## 2022-06-29 MED ORDER — PROPOFOL 1000 MG/100ML IV EMUL
INTRAVENOUS | Status: AC
  Filled 2022-06-29: qty 100

## 2022-06-29 MED ORDER — SODIUM CHLORIDE 0.9 % IV BOLUS
1000.0000 mL | Freq: Once | INTRAVENOUS | Status: AC
  Administered 2022-06-29: 1000 mL via INTRAVENOUS

## 2022-06-29 MED ORDER — CHLORHEXIDINE GLUCONATE CLOTH 2 % EX PADS
6.0000 | MEDICATED_PAD | Freq: Every day | CUTANEOUS | Status: DC
  Administered 2022-06-30 – 2022-07-15 (×22): 6 via TOPICAL

## 2022-06-29 MED ORDER — SODIUM CHLORIDE 0.9 % IV SOLN
500.0000 mg | INTRAVENOUS | Status: AC
  Administered 2022-06-29 – 2022-07-03 (×5): 500 mg via INTRAVENOUS
  Filled 2022-06-29 (×5): qty 5

## 2022-06-29 MED ORDER — FENTANYL CITRATE PF 50 MCG/ML IJ SOSY
50.0000 ug | PREFILLED_SYRINGE | INTRAMUSCULAR | Status: AC | PRN
  Administered 2022-06-29 – 2022-06-30 (×3): 50 ug via INTRAVENOUS

## 2022-06-29 MED ORDER — ORAL CARE MOUTH RINSE
15.0000 mL | OROMUCOSAL | Status: DC
  Administered 2022-06-29 – 2022-07-01 (×26): 15 mL via OROMUCOSAL

## 2022-06-29 MED ORDER — PANTOPRAZOLE SODIUM 40 MG IV SOLR
40.0000 mg | INTRAVENOUS | Status: DC
  Administered 2022-06-29 – 2022-07-10 (×12): 40 mg via INTRAVENOUS
  Filled 2022-06-29 (×12): qty 10

## 2022-06-29 MED ORDER — ONDANSETRON HCL 4 MG PO TABS: 4.0000 mg | ORAL_TABLET | Freq: Four times a day (QID) | ORAL | Status: DC | PRN

## 2022-06-29 MED ORDER — INSULIN ASPART 100 UNIT/ML IJ SOLN
0.0000 [IU] | Freq: Four times a day (QID) | INTRAMUSCULAR | Status: DC
  Administered 2022-06-29 – 2022-06-30 (×4): 1 [IU] via SUBCUTANEOUS
  Administered 2022-06-30: 2 [IU] via SUBCUTANEOUS
  Administered 2022-07-01 (×2): 3 [IU] via SUBCUTANEOUS

## 2022-06-29 MED ORDER — PROSOURCE TF20 ENFIT COMPATIBL EN LIQD
60.0000 mL | Freq: Two times a day (BID) | ENTERAL | Status: DC
  Administered 2022-06-29 – 2022-07-09 (×21): 60 mL
  Filled 2022-06-29 (×20): qty 60

## 2022-06-29 MED ORDER — CLOPIDOGREL BISULFATE 75 MG PO TABS
75.0000 mg | ORAL_TABLET | Freq: Every day | ORAL | Status: DC
  Administered 2022-06-29 – 2022-07-09 (×11): 75 mg
  Filled 2022-06-29 (×11): qty 1

## 2022-06-29 MED ORDER — IPRATROPIUM-ALBUTEROL 0.5-2.5 (3) MG/3ML IN SOLN
3.0000 mL | Freq: Four times a day (QID) | RESPIRATORY_TRACT | Status: DC
  Administered 2022-06-29 – 2022-07-01 (×9): 3 mL via RESPIRATORY_TRACT
  Filled 2022-06-29 (×9): qty 3

## 2022-06-29 MED ORDER — ACETAMINOPHEN 160 MG/5ML PO SOLN
650.0000 mg | Freq: Four times a day (QID) | ORAL | Status: DC | PRN
  Administered 2022-06-30 – 2022-07-01 (×3): 650 mg via ORAL
  Filled 2022-06-29 (×3): qty 20.3

## 2022-06-29 MED ORDER — SUCCINYLCHOLINE CHLORIDE 200 MG/10ML IV SOSY
200.0000 mg | PREFILLED_SYRINGE | Freq: Once | INTRAVENOUS | Status: AC
  Administered 2022-06-29: 20 mg via INTRAVENOUS

## 2022-06-29 MED ORDER — VANCOMYCIN HCL 2000 MG/400ML IV SOLN
2000.0000 mg | Freq: Once | INTRAVENOUS | Status: AC
  Administered 2022-06-29: 2000 mg via INTRAVENOUS
  Filled 2022-06-29: qty 400

## 2022-06-29 MED ORDER — FENTANYL 2500MCG IN NS 250ML (10MCG/ML) PREMIX INFUSION
0.0000 ug/h | INTRAVENOUS | Status: DC
  Administered 2022-06-29: 25 ug/h via INTRAVENOUS
  Administered 2022-06-29: 250 ug/h via INTRAVENOUS
  Administered 2022-06-30: 375 ug/h via INTRAVENOUS
  Administered 2022-06-30: 250 ug/h via INTRAVENOUS
  Administered 2022-06-30: 375 ug/h via INTRAVENOUS
  Administered 2022-07-01: 350 ug/h via INTRAVENOUS
  Administered 2022-07-01: 375 ug/h via INTRAVENOUS
  Administered 2022-07-01 – 2022-07-05 (×9): 300 ug/h via INTRAVENOUS
  Administered 2022-07-05: 200 ug/h via INTRAVENOUS
  Administered 2022-07-06: 150 ug/h via INTRAVENOUS
  Administered 2022-07-06: 300 ug/h via INTRAVENOUS
  Administered 2022-07-07 (×2): 150 ug/h via INTRAVENOUS
  Administered 2022-07-08: 175 ug/h via INTRAVENOUS
  Filled 2022-06-29 (×23): qty 250

## 2022-06-29 MED ORDER — PHENYLEPHRINE HCL-NACL 20-0.9 MG/250ML-% IV SOLN
0.0000 ug/min | INTRAVENOUS | Status: DC
  Administered 2022-06-29: 20 ug/min via INTRAVENOUS
  Filled 2022-06-29: qty 250

## 2022-06-29 MED ORDER — METHYLPREDNISOLONE SODIUM SUCC 40 MG IJ SOLR
40.0000 mg | Freq: Every day | INTRAMUSCULAR | Status: DC
  Administered 2022-06-29 – 2022-07-03 (×5): 40 mg via INTRAVENOUS
  Filled 2022-06-29 (×5): qty 1

## 2022-06-29 MED ORDER — CLOPIDOGREL BISULFATE 75 MG PO TABS: 75.0000 mg | ORAL_TABLET | Freq: Every day | ORAL | Status: DC

## 2022-06-29 MED ORDER — SODIUM CHLORIDE 0.9 % IV SOLN
2.0000 g | Freq: Two times a day (BID) | INTRAVENOUS | Status: DC
  Administered 2022-06-29 – 2022-07-03 (×8): 2 g via INTRAVENOUS
  Filled 2022-06-29 (×9): qty 12.5

## 2022-06-29 MED ORDER — FENTANYL CITRATE PF 50 MCG/ML IJ SOSY
50.0000 ug | PREFILLED_SYRINGE | INTRAMUSCULAR | Status: DC | PRN
  Administered 2022-06-29 – 2022-06-30 (×2): 100 ug via INTRAVENOUS
  Administered 2022-06-30: 50 ug via INTRAVENOUS
  Administered 2022-07-03: 100 ug via INTRAVENOUS
  Administered 2022-07-05 – 2022-07-06 (×3): 50 ug via INTRAVENOUS
  Filled 2022-06-29: qty 2

## 2022-06-29 MED ORDER — MIDAZOLAM HCL 2 MG/2ML IJ SOLN
1.0000 mg | INTRAMUSCULAR | Status: DC | PRN
  Administered 2022-06-29 – 2022-07-06 (×6): 1 mg via INTRAVENOUS
  Filled 2022-06-29 (×6): qty 2

## 2022-06-29 NOTE — ED Notes (Signed)
Date and time results received: 06/29/22 0615 (use smartphrase ".now" to insert current time)  Test: troponin Critical Value: 123  Name of Provider Notified: Everlene Balls, MD

## 2022-06-29 NOTE — H&P (Signed)
History and Physical    [name] FGH:[id number] DOB: 1961-01-14 DOA: 06/29/2022 DOS: the patient was seen and examined on 06/29/2022 PCP: Dione Housekeeper, MD  Patient coming from: Extended Care facility   Chief Complaint:  Chief Complaint  Patient presents with   Respiratory Distress   HPI: SHERIDAN GETTEL is a 61 y.o. male with medical history significant of type 2 diabetes with nephropathy, gastroesophageal reflux disease, hypertension, hyperlipidemia, chronic kidney disease stage IIIb, chronic respiratory failure, sleep apnea, class II obesity and recent hospitalization with multifocal pneumonia; presented from his extended care facility secondary to worsening shortness of breath and hypoxia.  On arrival to the emergency department patient condition further deteriorates and he was found to be obtunded with oxygen saturation in the mid 60s.  EMS personnel transported him providing bagging with bag-valve-mask.  Patient was intubated, mechanically ventilated and sedated.  At time of my evaluation unable to answer any questions or provide any history. Per EDP notes, EMS reports and information from his facility no chest pain, no nausea, no vomiting, no dysuria, no melena, no hematochezia, no focal deficits or any other complaints.  Workup in the ED demonstrating vascular congestion, pulmonary edema with concern for ARDS and multifocal pneumonia.  Patient COVID PCR was negative but he was found to be positive for influenza A.  TRH has been consulted to place patient in the hospital for further evaluation and management.  Review of Systems: As mentioned in the history of present illness. All other systems reviewed and are negative.  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  STROKE  JULY  2017   Type 2 diabetes mellitus ([place])    Vitamin D deficiency    Past Surgical History:  Procedure Laterality Date   ABDOMINAL AORTOGRAM W/LOWER EXTREMITY N/A 11/08/2021   Procedure: ABDOMINAL AORTOGRAM W/LOWER EXTREMITY;  Surgeon: Marty Heck, MD;  Location: [facility];  Service: Cardiovascular;  Laterality: N/A;   AMPUTATION Bilateral 2012   Left big toe partial and right big toe complete   CARDIOVASCULAR STRESS TEST  04-05-2014  dr croitoru   low risk lexiscan nuclear study with mild diaphragmatic attenuation artifact/  no ischemia/  ef 58%   CATARACT EXTRACTION W/ INTRAOCULAR LENS  IMPLANT, BILATERAL  2013   CYSTO /  LEFT URETERAL STENT PLACEMENT  2012   CYSTOSCOPY W/ URETERAL STENT REMOVAL Left 07/07/2014   Procedure: CYSTO WITH LEFT PORTION STENT REMOVAL;  Surgeon: Malka So, MD;  Location[facility];  Service: Urology;  Laterality: Left;  CYSTOSCOPY WITH URETEROSCOPY AND STENT PLACEMENT Left 07/07/2014   Procedure: CYSTOLITHALOPAXY URETEROSCOPY WITH STENT;  Surgeon: Malka So, MD;  Location: [facility];  Service: Urology;  Laterality: Left;   ENDARTERECTOMY Right 04/04/2016   Procedure: ENDARTERECTOMY CAROTID ARTERY RIGHT;  Surgeon: Angelia Mould, MD;  Location: [place];  Service: Vascular;  Laterality: Right;   HOLMIUM LASER APPLICATION N/A 49/11/4965   Procedure: HOLMIUM LASER APPLICATION;  Surgeon: Malka So, MD;  Location: [facility];  Service: Urology;  Laterality: N/A;   I & D  INFECTED SPIDER BITE UPPER BACK  06/ 2012   NEPHROLITHOTOMY Left 08/16/2014   Procedure: LEFT NEPHROLITHOTOMY PERCUTANEOUS;  Surgeon: Malka So, MD;  Location: [facility];  Service: Urology;  Laterality: Left;   NEPHROLITHOTOMY Left 08/18/2014   Procedure: LEFT NEPHROLITHOTOMY PERCUTANEOUS SECOND LOOK;  Surgeon: Malka So, MD;  Location: [facility];  Service: Urology;  Laterality: Left;   PATCH ANGIOPLASTY Right 04/04/2016   Procedure: PATCH ANGIOPLASTY RIGHT CAROTID ARTERY;  Surgeon: Angelia Mould, MD;  Location: [place];  Service: Vascular;  Laterality: Right;   PERIPHERAL VASCULAR BALLOON ANGIOPLASTY  11/08/2021   Procedure: PERIPHERAL VASCULAR BALLOON ANGIOPLASTY;  Surgeon: Marty Heck, MD;  Location: [place];  Service: Cardiovascular;;  Left AT   RETINAL DETACHMENT SURGERY Bilateral 2013   TONSILLECTOMY AND ADENOIDECTOMY  as child   Social History:  reports that he has never smoked. He has never used smokeless tobacco. He reports that he does not currently use alcohol. He reports that he does not use drugs.  No Known Allergies  Family History  Problem Relation Age of Onset   Cancer Mother        Throat   Stroke Neg Hx     Prior to Admission medications   Medication Sig Start Date End Date Taking? Authorizing Provider  Amino Acids-Protein Hydrolys (PRO-STAT PO) Take 30 mLs by mouth in the morning and at bedtime.    [provider]  amLODipine (NORVASC) 10 MG tablet Take 10 mg by mouth daily.    [provider]  aspirin EC 81 MG tablet Take 81 mg by mouth daily.    [provider]  atorvastatin (LIPITOR) 40 MG tablet Take 40 mg by mouth daily.    [provider]  Cholecalciferol 1.25 MG (50000 UT) TABS Take 1.25 mg by mouth once a week. Mondays    [provider]  Cholecalciferol 100 MCG (4000 UT) TABS Take 1 tablet by mouth daily.    [provider]  clopidogrel (PLAVIX) 75 MG tablet Take 1 tablet (75 mg total) by mouth daily. 11/08/21 11/08/22  Marty Heck, MD  ferrous sulfate 325 (65 FE) MG tablet Take 325 mg by mouth daily with breakfast.    [provider]  hydrALAZINE (APRESOLINE) 100 MG tablet Take 1 tablet (100 mg total) by mouth every 8 (eight) hours. 05/18/22 06/17/22  Shahmehdi, Valeria Batman, MD  icosapent Ethyl (VASCEPA) 1 g capsule Take 2 g by mouth 2 (two) times daily. 02/01/22   [provider]  insulin glargine (LANTUS) 100 UNIT/ML injection Inject 40 Units into the skin at bedtime.    [provider]  Multiple Vitamin (MULTIVITAMIN) tablet Take 1 tablet by mouth daily.    [provider]  Omega-3 1000 MG CAPS Take 1 capsule by mouth every morning.    [provider]  OXYGEN Inhale 4 L into the lungs as needed (  hypoxia).    [provider]  OYSTER SHELL CALCIUM PO Take 500 mg by mouth daily.    [provider]  tamsulosin (FLOMAX) 0.4 MG CAPS capsule Take 1 capsule (0.4 mg total) by mouth daily after supper. 05/18/22 07/17/22  ShahmehdiValeria Batman, MD  vitamin B-12 (CYANOCOBALAMIN) 500 MCG tablet Take 500 mcg by mouth daily.    [provider]  vitamin C (ASCORBIC ACID) 500 MG tablet Take 1,000 mg by mouth 2 (two) times daily.    [provider]    Physical Exam: Vitals:   06/29/22 0815 06/29/22 0841 06/29/22 0849 06/29/22 0900  BP:    116/72  Pulse: 97  (!) 103 (!) 101  Resp: (!) 24  (!) 24 (!) 22  Temp: (!) 97 F (36.1 C)  (!) 96.4 F (35.8 C) (!) 77.4 F (25.2 C)  TempSrc:    Bladder  SpO2: 92%  91% 93%  Weight: 110.2 kg 120.8 kg    Height:  5\' 11"  (1.803 m)     General exam: Sedated, intubated and mechanically ventilated; currently afebrile. Respiratory system: Positive rhonchi at fine crackles at the bases.  Positive tachypnea on exam. Cardiovascular system:RRR. No rubs or gallops. Gastrointestinal system: Abdomen is  obese, nondistended, soft and nontender. No organomegaly or masses felt. Normal bowel sounds heard. Central nervous system: Unable to assess given current sedation and intubation process. Extremities: No cyanosis or clubbing; 2+ edema appreciated bilaterally. Skin: No petechiae. Psychiatry: Unable to assess given current sedation and intubation process.  Data Reviewed: Comprehensive metabolic panel: Sodium 637, potassium 4.1, chloride 1 glucose 220 range, BUN 33, creatinine 2.55, AST 60, ALT 37 and GFR 28. Troponin high sensitive: 123 BNP: 478 Respiratory panel: Positive for influenza A; negative for COVID PCR. CBC: WBCs 10.2, hemoglobin 10.2 platelets count 303K UA: Not demonstrating signs of acute infection. Chest x-ray: Demonstrating cardiac enlargement, bilateral pulmonary opacities concerning for multifocal infection versus pulmonary edema and presumably ARDS.  Right pleural effusion appreciated.  Assessment and Plan: * Acute respiratory failure with hypoxia (HCC) -Acute respiratory failure with hypoxia secondary to pulmonary edema/acute on chronic systolic heart failure, influenza and concerns for multifocal pneumonia. -Recent hospitalization for multifocal pneumonia; but obese changes most likely healing process; but unable to rule out currently. -Positive influenza A present at time of admission -Negative COVID PCR. -Continue coverage with IV antibiotics, Tamiflu, Lasix and wean off ventilatory support as tolerated. -Provide bronchodilator management and steroids therapy. -Follow clinical response. -Daily weights, strict intake and output, along with ARDS protocol for ventilatory support patient's will be provided.  CKD (chronic kidney disease) stage 3B -Appears to be stable overall -Follow renal function trend with acute diuresis process -Foley catheter in place in the setting of acute critical ill; no complaints of retention and urinalysis not suggesting UTI. -Reassess for  early discontinuation in the next 24-48 hours.  Elevated troponin -Appears to be demand ischemia in presentation due to acute on chronic heart failure -Patient also with chronic renal failure that might be participating in his elevated troponin level. -Follow clinical response and continue supportive care.  Influenza A with pneumonia -Will provide treatment with Tamiflu -Bronchodilator management and steroids will be also provided. -Follow clinical response.  Acute on chronic systolic HF (heart failure) (HCC) -Elevated BNP, ARDS changes appreciated on chest images -Continue IV diuresis -Follow daily weights, strict intake and output -Recent echo in October 2023 demonstrating ejection fraction 45 to 50%.  Type 2 diabetes mellitus ([place]) -With nephropathy; chronic kidney  disease a stage IIIb. -Continue sliding scale insulin and the use of long-acting -Follow CBGs and adjust hypoglycemic regimen as needed -Recent A1c 5.5; Holding on repeating levels.  Hypertension -Overall stable -Continue current antihypertensive agent -Follow-up vital signs.  Class II obesity -Low calorie diet, portion control and increase physical activity will be discussed with patient once extubated and oriented. -Body mass index is 37.14 kg/m.  GERD/GI prophylaxis -Continue PPI.    Advance Care Planning:   Code Status: Full Code   Consults: None  Family Communication: No family at bedside.  Severity of Illness: The appropriate patient status for this patient is INPATIENT. Inpatient status is judged to be reasonable and necessary in order to provide the required intensity of service to ensure the patient's safety. The patient's presenting symptoms, physical exam findings, and initial radiographic and laboratory data in the context of their chronic comorbidities is felt to place them at high risk for further clinical deterioration. Furthermore, it is not anticipated that the patient will be medically  stable for discharge from the hospital within 2 midnights of admission.   * I certify that at the point of admission it is my clinical judgment that the patient will require inpatient hospital care spanning beyond 2 midnights from the point of admission due to high intensity of service, high risk for further deterioration and high frequency of surveillance required.*  Author: Barton Dubois, MD 06/29/2022 11:51 AM  For on call review [email or web address].

## 2022-06-29 NOTE — Progress Notes (Signed)
ABG taken to lab around 06:29, still awaiting results.

## 2022-06-29 NOTE — Assessment & Plan Note (Signed)
-  With nephropathy; chronic kidney disease a stage IIIb. -Continue sliding scale insulin and the use of long-acting -Follow CBGs and adjust hypoglycemic regimen as needed -Recent A1c 5.5; Holding on repeating levels.

## 2022-06-29 NOTE — ED Triage Notes (Signed)
Pt BIB [facility] from [facility], pt found to be unresponsive at 8% on RA per facility, EMS began bagging pt and got him up to 84%. Pt regained some responsiveness upon arrival. Able to open eyes and tell his name.   Pt diaphoretic and belly breathing, currently 67%.  Dr. Stark Jock at bedside preparing for intubation.

## 2022-06-29 NOTE — ED Provider Notes (Signed)
[facility] EMERGENCY DEPARTMENT Provider Note   CSN: [id number] Arrival date & time: 06/29/22  0501     History  Chief Complaint  Patient presents with   Respiratory Distress    Oscar Rose is a 61 y.o. male.  Patient is a 61 year old male with past medical history of type 2 diabetes, chronic renal insufficiency, hypertension, and carotid aneurysm, and admission in October for bilateral multifocal pneumonia with acute hypoxic respiratory failure.  Patient sent from his extended care facility for evaluation of shortness of breath and cough.  According to the EMS personnel, patient began coughing this evening, then developed respiratory distress with hypoxia.  When EMS arrived, oxygen saturations were very low and patient was obtunded.  They began bagging with bag-valve-mask and then transported patient here.  Patient is in severe respiratory distress and unable to answer any questions.  The history is provided by the EMS personnel, the nursing home and medical records.       Home Medications Prior to Admission medications   Medication Sig Start Date End Date Taking? Authorizing Provider  Amino Acids-Protein Hydrolys (PRO-STAT PO) Take 30 mLs by mouth in the morning and at bedtime.    [provider]  amLODipine (NORVASC) 10 MG tablet Take 10 mg by mouth daily.    [provider]  aspirin EC 81 MG tablet Take 81 mg by mouth daily.    [provider]  atorvastatin (LIPITOR) 40 MG tablet Take 40 mg by mouth daily.    [provider]  Cholecalciferol 1.25 MG (50000 UT) TABS Take 1.25 mg by mouth once a week. Mondays    [provider]  Cholecalciferol 100 MCG (4000 UT) TABS Take 1 tablet by mouth daily.    [provider]  clopidogrel (PLAVIX) 75 MG tablet Take 1 tablet (75 mg total) by mouth daily. 11/08/21 11/08/22  Marty Heck, MD  ferrous sulfate 325 (65 FE) MG tablet Take 325 mg by mouth daily with breakfast.     [provider]  hydrALAZINE (APRESOLINE) 100 MG tablet Take 1 tablet (100 mg total) by mouth every 8 (eight) hours. 05/18/22 06/17/22  Shahmehdi, Valeria Batman, MD  icosapent Ethyl (VASCEPA) 1 g capsule Take 2 g by mouth 2 (two) times daily. 02/01/22   [provider]  insulin glargine (LANTUS) 100 UNIT/ML injection Inject 40 Units into the skin at bedtime.    [provider]  Multiple Vitamin (MULTIVITAMIN) tablet Take 1 tablet by mouth daily.    [provider]  Omega-3 1000 MG CAPS Take 1 capsule by mouth every morning.    [provider]  OXYGEN Inhale 4 L into the lungs as needed (hypoxia).    [provider]  OYSTER SHELL CALCIUM PO Take 500 mg by mouth daily.    [provider]  tamsulosin (FLOMAX) 0.4 MG CAPS capsule Take 1 capsule (0.4 mg total) by mouth daily after supper. 05/18/22 07/17/22  ShahmehdiValeria Batman, MD  vitamin B-12 (CYANOCOBALAMIN) 500 MCG tablet Take 500 mcg by mouth daily.    [provider]  vitamin C (ASCORBIC ACID) 500 MG tablet Take 1,000 mg by mouth 2 (two) times daily.    [provider]      Allergies    Patient has no known allergies.    Review of Systems   Review of Systems  Unable to perform ROS: Acuity of condition    Physical Exam Updated Vital Signs There were no vitals taken for  this visit. Physical Exam Vitals and nursing note reviewed.  Constitutional:      General: He is in acute distress.     Appearance: He is well-developed. He is ill-appearing and diaphoretic.     Comments: Patient is obtunded, but will move extremities to noxious stimuli.  HENT:     Head: Normocephalic and atraumatic.  Cardiovascular:     Rate and Rhythm: Normal rate and regular rhythm.     Heart sounds: No murmur heard.    No friction rub.  Pulmonary:     Effort: Respiratory distress present.     Breath sounds: Normal breath sounds.     Comments: Patient in severe respiratory distress with  ventilations being performed by bag-valve-mask.  Breath sounds are coarse bilaterally. Abdominal:     General: Bowel sounds are normal. There is no distension.     Palpations: Abdomen is soft.     Tenderness: There is no abdominal tenderness.  Musculoskeletal:        General: Normal range of motion.     Cervical back: Normal range of motion and neck supple.     Right lower leg: Edema present.     Left lower leg: Edema present.     Comments: There is 2-3+ pitting edema of both lower extremities.  Skin:    General: Skin is warm.     ED Results / Procedures / Treatments   Labs (all labs ordered are listed, but only abnormal results are displayed) Labs Reviewed  RESP PANEL BY RT-PCR (FLU A&B, COVID) ARPGX2  CULTURE, BLOOD (ROUTINE X 2)  CULTURE, BLOOD (ROUTINE X 2)  COMPREHENSIVE METABOLIC PANEL  LACTIC ACID, PLASMA  LACTIC ACID, PLASMA  BRAIN NATRIURETIC PEPTIDE  CBC WITH DIFFERENTIAL/PLATELET  URINALYSIS, ROUTINE W REFLEX MICROSCOPIC  TROPONIN I (HIGH SENSITIVITY)    EKG EKG Interpretation  Date/Time:  Saturday June 29 2022 05:15:09 EST Ventricular Rate:  99 PR Interval:  196 QRS Duration: 88 QT Interval:  362 QTC Calculation: 465 R Axis:   61 Text Interpretation: Sinus rhythm Low voltage with right axis deviation Consider anterior infarct Nonspecific T abnormalities, lateral leads  No significant change since 05/12/2022 Confirmed by Veryl Speak [phone]) on 06/29/2022 5:20:29 AM  Radiology No results found.  Procedures Procedure Name: Intubation Date/Time: 06/29/2022 6:49 AM  Performed by: Veryl Speak, MDPre-anesthesia Checklist: Patient identified, Patient being monitored, Emergency Drugs available, Timeout performed and Suction available Oxygen Delivery Method: Non-rebreather mask Preoxygenation: Pre-oxygenation with 100% oxygen Induction Type: Rapid sequence Ventilation: Mask ventilation without difficulty Laryngoscope Size: Glidescope and 3 Grade  View: Grade II Tube size: 7.5 mm Number of attempts: 1 Placement Confirmation: ETT inserted through vocal cords under direct vision, CO2 detector and Breath sounds checked- equal and bilateral Tube secured with: ETT holder Dental Injury: Teeth and Oropharynx as per pre-operative assessment  Comments: Successful ET tube placement using RSI with etomidate and succinylcholine.         Medications Ordered in ED Medications - No data to display  ED Course/ Medical Decision Making/ A&P  Patient is a 61 year old male with past medical history as per HPI sent from his extended care facility for evaluation of respiratory distress.  He was recently admitted for multifocal pneumonia, then discharged to this facility.  This evening he began coughing, then was found in significant respiratory distress with profound hypoxia.  He required bagging by EMS and arrived here obtunded and minimally responsive.  With maximal effort from bag-valve-mask, oxygen saturations were only in the upper  62s by EMS and with ER staff.  For this reason, I decided to proceed with intubation.  Patient was given 20 mg of etomidate followed by 200 mg of succinylcholine.  The GlideScope and #3 MacIntosh blade was used to intubate.  Cords were easily visualized and tube placement confirmed with direct visualization, end-tidal CO2, and auscultation over the stomach and lungs.  Workup initiated including laboratory studies and portable chest x-ray.  Laboratory studies are basically consistent with baseline with the exception of a troponin of 123, lactate of 4.0.  COVID swab negative, but influenza a test was positive.  Postintubation checks x-ray reveals satisfactory placement of the ET tube, but diffuse bilateral pulmonary opacities concerning for multifocal infection versus pulmonary edema versus ARDS was also noted.  Patient was given Vanco and cefepime for presumed bacterial pneumonia.  He has been continued on a propofol  drip for sedation.  Patient to be admitted to the hospitalist service for further care.   CRITICAL CARE Performed by: Veryl Speak Total critical care time: 60 minutes Critical care time was exclusive of separately billable procedures and treating other patients. Critical care was necessary to treat or prevent imminent or life-threatening deterioration. Critical care was time spent personally by me on the following activities: development of treatment plan with patient and/or surrogate as well as nursing, discussions with consultants, evaluation of patient's response to treatment, examination of patient, obtaining history from patient or surrogate, ordering and performing treatments and interventions, ordering and review of laboratory studies, ordering and review of radiographic studies, pulse oximetry and re-evaluation of patient's condition.   Final Clinical Impression(s) / ED Diagnoses Final diagnoses:  None    Rx / DC Orders ED Discharge Orders     None         Veryl Speak, MD 06/29/22 [phone]

## 2022-06-29 NOTE — ED Notes (Signed)
This RN called ICU, Shonna Chock, RN) to inform them pt is on the way to unit and pt became alert in transport

## 2022-06-29 NOTE — Assessment & Plan Note (Addendum)
-  Continue treatment with Tamiflu -Continue bronchodilator management and the use of a steroid -Follow clinical response.

## 2022-06-29 NOTE — ED Notes (Signed)
0506= etom 200 0506=succ 20 0507=successful intubation7.5  25 @lip  with color change.  0512=OG tube placed 18 fr external length 43cm 0516= temp foley 34fr 10cc balloon placed with golden color urine return.

## 2022-06-29 NOTE — Assessment & Plan Note (Addendum)
-  Appears to be demand ischemia in presentation due to acute on chronic heart failure. -No ischemic changes appreciated on telemetry -Continue to follow clinical response -Continue treatment with Lipitor, aspirin and Plavix

## 2022-06-29 NOTE — ED Notes (Signed)
Date and time results received: 06/29/22 0614 (use smartphrase ".now" to insert current time)  Test: lactic acid Critical Value: 4.0  Name of Provider Notified: Everlene Balls, MD

## 2022-06-29 NOTE — Assessment & Plan Note (Addendum)
-  Acute respiratory failure with hypoxia secondary to pulmonary edema/acute on chronic systolic heart failure, influenza and concerns for multifocal pneumonia. -Still requiring ventilatory support; patient is a spiking fever -Continue treatment with Tamiflu, current antibiotic, bronchodilators, steroids and IV Lasix -Follow clinical results and wean off ventilatory support as tolerated. -Still not ready. -Appreciate assistance and recommendation by PCCM; repeat chest x-ray 8 this morning demonstrated improved variation.. -Stable ABG.

## 2022-06-29 NOTE — Assessment & Plan Note (Addendum)
-  Elevated BNP, ARDS changes appreciated on chest images -Continue IV diuresis -Follow daily weights, strict intake and output -Recent echo in October 2023 demonstrating ejection fraction 45 to 50%.

## 2022-06-29 NOTE — Progress Notes (Signed)
Initial Nutrition Assessment  DOCUMENTATION CODES:   Not applicable  INTERVENTION:   Initiate tube feeds via OG tube: - Start Vital 1.5 @ 20 ml/hr and advance by 10 ml q 8 hours to goal rate of 55 ml/hr (1320 ml/day) - PROSource TF20 60 ml BID  Tube feeding regimen at goal rate provides 2140 kcal, 129 grams of protein, and 1008 ml of H2O.  NUTRITION DIAGNOSIS:   Inadequate oral intake related to inability to eat as evidenced by NPO status.  GOAL:   Patient will meet greater than or equal to 90% of their needs  MONITOR:   Vent status, Labs, Weight trends, TF tolerance, I & O's  REASON FOR ASSESSMENT:   Ventilator, Consult Enteral/tube feeding initiation and management  ASSESSMENT:   61 year old male who presented to the ED on 11/25 after being found unresponsive at facility. PMH of T2DM with nephropathy, GERD, HTN, HLD, CKD stage IIIb, chronic respiratory failure, sleep apnea. Noted pt with recent hospitalization for multifocal PNA. Pt required intubation. Pt admitted with acute respiratory failure secondary to pulmonary edema/acute on chronic systolic HF, influenza.  RD working remotely. Consult received for enteral nutrition initiation and management. Pt with OG tube tip and side port below diaphragm per x-ray.  Unable to obtain diet and weight history at this time. Reviewed weight history in chart. Noted pt with weight loss from March 2023 to October 2023. Pt with about a 10 kg weight gain since October 2023. However, noted pt with non-pitting edema to BUE and very deep pitting edema to BLE. Unsure of pt's true dry weight.  Patient is currently intubated on ventilator support MV: 16.6 L/min Temp (24hrs), Avg:95.9 F (35.5 C), Min:77.4 F (25.2 C), Max:98.8 F (37.1 C)  Drips: Propofol: 40 mcg/kg/min (provides 565 kcal daily from lipid) Fentanyl  Medications reviewed and include: colace, IV lasix 20 mg q 12 hours, SSI q 6 hours, semglee 10 units daily, IV  solu-medrol, IV protonix, miralax, IV abx  Labs reviewed: BUN 33, creatinine 2.55, phosphorus 5.1 CBG's: 147-181  I/O's: +2.4 L since admit  NUTRITION - FOCUSED PHYSICAL EXAM:  Unable to complete at this time. RD working remotely.  Diet Order:   Diet Order     None       EDUCATION NEEDS:   No education needs have been identified at this time  Skin:  Skin Assessment: Reviewed RN Assessment  Last BM:  no documented BM  Height:   Ht Readings from Last 1 Encounters:  06/29/22 5\' 11"  (1.803 m)    Weight:   Wt Readings from Last 1 Encounters:  06/29/22 120.8 kg    Ideal Body Weight:  78.2 kg  BMI:  Body mass index is 37.14 kg/m.  Estimated Nutritional Needs:   Kcal:  2100-2300  Protein:  110-130 grams  Fluid:  >2.0 L    Gustavus Bryant, MS, RD, LDN Inpatient Clinical Dietitian Please see AMi[facility] for contact information.

## 2022-06-29 NOTE — Assessment & Plan Note (Addendum)
-  renal function has currently worsened and demonstrating hyperkalemia, Cr 3.66 currently -Continue to closely follow renal function trend -Patient might require renal replacement therapy -Continue Foley catheter -Urinalysis ruling out the presence of UTI currently.

## 2022-06-29 NOTE — Assessment & Plan Note (Signed)
-  Overall stable -Continue current antihypertensive agent -Follow-up vital signs.

## 2022-06-30 DIAGNOSIS — N1832 Chronic kidney disease, stage 3b: Secondary | ICD-10-CM | POA: Diagnosis not present

## 2022-06-30 DIAGNOSIS — J9601 Acute respiratory failure with hypoxia: Secondary | ICD-10-CM | POA: Diagnosis not present

## 2022-06-30 DIAGNOSIS — J09X1 Influenza due to identified novel influenza A virus with pneumonia: Secondary | ICD-10-CM | POA: Diagnosis not present

## 2022-06-30 DIAGNOSIS — J80 Acute respiratory distress syndrome: Secondary | ICD-10-CM | POA: Diagnosis not present

## 2022-06-30 LAB — GLUCOSE, CAPILLARY
Glucose-Capillary: 133 mg/dL — ABNORMAL HIGH (ref 70–99)
Glucose-Capillary: 133 mg/dL — ABNORMAL HIGH (ref 70–99)
Glucose-Capillary: 154 mg/dL — ABNORMAL HIGH (ref 70–99)
Glucose-Capillary: 173 mg/dL — ABNORMAL HIGH (ref 70–99)
Glucose-Capillary: 196 mg/dL — ABNORMAL HIGH (ref 70–99)
Glucose-Capillary: 224 mg/dL — ABNORMAL HIGH (ref 70–99)
Glucose-Capillary: 89 mg/dL (ref 70–99)
Glucose-Capillary: 99 mg/dL (ref 70–99)

## 2022-06-30 LAB — BLOOD GAS, ARTERIAL
Acid-base deficit: 4 mmol/L — ABNORMAL HIGH (ref 0.0–2.0)
Bicarbonate: 19.6 mmol/L — ABNORMAL LOW (ref 20.0–28.0)
Drawn by: 10555
FIO2: 55 %
O2 Saturation: 98 %
Patient temperature: 38.3
pCO2 arterial: 33 mmHg (ref 32–48)
pH, Arterial: 7.39 (ref 7.35–7.45)
pO2, Arterial: 86 mmHg (ref 83–108)

## 2022-06-30 LAB — BASIC METABOLIC PANEL
Anion gap: 10 (ref 5–15)
BUN: 56 mg/dL — ABNORMAL HIGH (ref 6–20)
CO2: 20 mmol/L — ABNORMAL LOW (ref 22–32)
Calcium: 8 mg/dL — ABNORMAL LOW (ref 8.9–10.3)
Chloride: 105 mmol/L (ref 98–111)
Creatinine, Ser: 2.93 mg/dL — ABNORMAL HIGH (ref 0.61–1.24)
GFR, Estimated: 24 mL/min — ABNORMAL LOW (ref >60)
Glucose, Bld: 111 mg/dL — ABNORMAL HIGH (ref 70–99)
Potassium: 4 mmol/L (ref 3.5–5.1)
Sodium: 135 mmol/L (ref 135–145)

## 2022-06-30 LAB — PHOSPHORUS
Phosphorus: 3 mg/dL (ref 2.5–4.6)
Phosphorus: 3.3 mg/dL (ref 2.5–4.6)

## 2022-06-30 LAB — MAGNESIUM
Magnesium: 1.7 mg/dL (ref 1.7–2.4)
Magnesium: 1.7 mg/dL (ref 1.7–2.4)

## 2022-06-30 LAB — TRIGLYCERIDES: Triglycerides: 59 mg/dL (ref <150)

## 2022-06-30 MED ORDER — ACETAMINOPHEN 325 MG PO TABS
650.0000 mg | ORAL_TABLET | Freq: Once | ORAL | Status: AC
  Administered 2022-06-30: 650 mg via ORAL
  Filled 2022-06-30: qty 2

## 2022-06-30 MED ORDER — SODIUM CHLORIDE 0.9 % IV BOLUS
250.0000 mL | Freq: Once | INTRAVENOUS | Status: AC
  Administered 2022-06-30: 250 mL via INTRAVENOUS

## 2022-06-30 MED ORDER — ENOXAPARIN SODIUM 60 MG/0.6ML IJ SOSY
60.0000 mg | PREFILLED_SYRINGE | INTRAMUSCULAR | Status: DC
  Administered 2022-06-30 – 2022-07-01 (×2): 60 mg via SUBCUTANEOUS
  Filled 2022-06-30 (×2): qty 0.6

## 2022-06-30 MED ORDER — FUROSEMIDE 10 MG/ML IJ SOLN
40.0000 mg | Freq: Once | INTRAMUSCULAR | Status: AC
  Administered 2022-06-30: 40 mg via INTRAVENOUS
  Filled 2022-06-30: qty 4

## 2022-06-30 NOTE — Assessment & Plan Note (Addendum)
-  Continue IV diuresis -Continue ARDS protocol and his ventilatory settings -Follow-up PCCM recommendations.

## 2022-06-30 NOTE — TOC Initial Note (Signed)
Transition of Care [facility]) - Initial/Assessment Note    Patient Details  Name: Oscar Rose MRN: [id number] Date of Birth: 09/30/1960  [facility]) CM/SW Contact:    Iona Beard, [place] Phone Number: 06/30/2022, 10:25 AM  Clinical Narrative:                 Pt is high risk for readmission. Pt is long term resident at [facility]. Fl2 needed prior to pts D/C. TOC to follow.   Expected Discharge Plan: Long Term Nursing Home Barriers to Discharge: Continued Medical Work up   Patient Goals and CMS Choice Patient states their goals for this hospitalization and ongoing recovery are:: return to LTC CMS Medicare.gov Compare Post Acute Care list provided to:: Patient Choice offered to / list presented to : Patient  Expected Discharge Plan and Services Expected Discharge Plan: [place] In-house Referral: Clinical Social Work Discharge Planning Services: CM Consult Post Acute Care Choice: Nursing Home Living arrangements for the past 2 months: [place]                                      Prior Living Arrangements/Services Living arrangements for the past 2 months: [place] Lives with:: Facility Resident   Do you feel safe going back to the place where you live?: Yes      Need for Family Participation in Patient Care: Yes (Comment) Care giver support system in place?: Yes (comment)   Criminal Activity/Legal Involvement Pertinent to Current Situation/Hospitalization: No - Comment as needed  Activities of Daily Living      Permission Sought/Granted                  Emotional Assessment Appearance:: Appears stated age       Alcohol / Substance Use: Not Applicable Psych Involvement: No (comment)  Admission diagnosis:  Acute respiratory failure with hypoxia ([place]) [J96.01] HCAP (healthcare-associated pneumonia) [J18.9] Patient Active Problem List   Diagnosis Date Noted   Acute respiratory failure  with hypoxia ([place]) 06/29/2022   Acute on chronic systolic HF (heart failure) ([place]) 06/29/2022   Influenza A with pneumonia 06/29/2022   Elevated troponin 06/29/2022   ARDS (adult respiratory distress syndrome) ([place]) 06/29/2022   Sepsis due to multifocal pneumonia with acute hypoxic respiratory failure 05/14/2022   AKI (acute kidney injury) superimposed on CKD 3B 05/14/2022   CKD (chronic kidney disease) stage 3B 05/14/2022   Acute hypoxemic respiratory failure due to bilateral multifocal pneumonia 05/12/2022   Acute hypoxic respiratory failure due to bilateral multifocal pneumonia 05/12/2022   BPPV (benign paroxysmal positional vertigo) 04/30/2017   Carotid aneurysm, right ([place]) 04/04/2016   Cerebral infarction due to unspecified mechanism    Essential hypertension    CVA (cerebral infarction) 02/13/2016   Dehydration 02/13/2016   Fall at home 02/13/2016   Hyponatremia 02/13/2016   Encephalopathy 02/13/2016   Hypertension    Type 2 diabetes mellitus ([place])    Legal blindness of left eye, as defined in [place]    CKD (chronic kidney disease), stage II    Renal stone 08/16/2014   Precordial pain 03/23/2014   Impingement syndrome of shoulder 02/02/2013   PCP:  Dione Housekeeper, MD Pharmacy:   [facility], [place] [place] [address] Phone: [phone] Fax: [place], [place] -  [address] [address] Phone: [phone] Fax: [phone]     Social Determinants of Health (SDOH) Interventions    Readmission Risk Interventions    06/30/2022   10:23 AM  Readmission Risk Prevention Plan  Transportation Screening Complete  Medication Review (RN Care Manager) Complete  HRI or Home Care Consult Complete  SW Recovery Care/Counseling Consult Complete  Palliative Care Screening Not Applicable  Skilled Nursing Facility Complete

## 2022-06-30 NOTE — Progress Notes (Signed)
Patient urine output been 40 ml since 1800. Md notified, 250 ml NS 0.9% bolus given, no output, 40 mg lasix given per order. MD notified, will continue to monitor.

## 2022-06-30 NOTE — Progress Notes (Signed)
Blood gas completed at 2130 , 11/25 . Checking to see if rate of 28 was high for patient. Blood gas in normal range . No changes made to vent. Patient is not breathing over vent any.

## 2022-06-30 NOTE — Progress Notes (Signed)
Progress Note   [name] PYP:[id number] DOB: May 10, 1961 DOA: 06/29/2022     1 DOS: the patient was seen and examined on 06/30/2022   Brief hospital course: HERNANDEZ LOSASSO is a 61 y.o. male with medical history significant of type 2 diabetes with nephropathy, gastroesophageal reflux disease, hypertension, hyperlipidemia, chronic kidney disease stage IIIb, chronic respiratory failure, sleep apnea, class II obesity and recent hospitalization with multifocal pneumonia; presented from his extended care facility secondary to worsening shortness of breath and hypoxia.  On arrival to the emergency department patient condition further deteriorates and he was found to be obtunded with oxygen saturation in the mid 60s.  EMS personnel transported him providing bagging with bag-valve-mask.  Patient was intubated, mechanically ventilated and sedated.  At time of my evaluation unable to answer any questions or provide any history. Per EDP notes, EMS reports and information from his facility no chest pain, no nausea, no vomiting, no dysuria, no melena, no hematochezia, no focal deficits or any other complaints.   Workup in the ED demonstrating vascular congestion, pulmonary edema with concern for ARDS and multifocal pneumonia.  Patient COVID PCR was negative but he was found to be positive for influenza A.  TRH has been consulted to place patient in the hospital for further evaluation and management.  Assessment and Plan: * Acute respiratory failure with hypoxia (HCC) -Acute respiratory failure with hypoxia secondary to pulmonary edema/acute on chronic systolic heart failure, influenza and concerns for multifocal pneumonia. -Still requiring ventilatory support; patient is a spiking fever -Continue treatment with Tamiflu, current antibiotic, bronchodilators, steroids and IV Lasix -Follow clinical results and wean off ventilatory support as tolerated. -Still not ready. -PCCM will be notified for  assistance with ventilatory management and ARDS care on 07/01/2022. -Repeat chest x-ray and ABG in AM.  CKD (chronic kidney disease) stage 3B -Appears to be stable overall -Continue to follow renal function trend with acute diuresis process -Foley catheter in place in the setting of acute critical ill; no complaints of retention and urinalysis not suggesting UTI. -Reassess for early discontinuation in the next 24-48 hours.   ARDS (adult respiratory distress syndrome) (HCC) -Continue IV diuresis -Continue ARDS protocol and his ventilatory settings -PCCM will be consulted and will follow any further recommendations.  Elevated troponin -Appears to be demand ischemia in presentation due to acute on chronic heart failure. -No ischemic changes appreciated on telemetry -Continue to follow clinical response -Continue treatment with Lipitor, aspirin and Plavix   Influenza A with pneumonia -Continue treatment with Tamiflu -Continue bronchodilator management and the use of a steroid -Follow clinical response.  Acute on chronic systolic HF (heart failure) (HCC) -Elevated BNP, ARDS changes appreciated on chest images -Continue IV diuresis -ARDS protocol on his ventilatory settings. -Recent echo in October 2023 demonstrating ejection fraction 45 to 50%. -Continue to follow daily weights and strict intake and output.  Type 2 diabetes mellitus (HCC) -With nephropathy; chronic kidney disease a stage IIIb. -Continue sliding scale insulin and the use of long-acting -Follow CBGs and adjust hypoglycemic regimen as needed -Recent A1c 5.5; Holding on repeating levels.  Hypertension -Overall stable -Continue current antihypertensive agent -Follow-up vital signs.   Subjective:  Spiking fevers, demonstrating signs of fluid overload (Increased lower extremity swelling, fine crackles and decreased breath sounds on auscultation); still sedated, intubated and mechanically ventilated.  Physical  Exam: Vitals:   06/30/22 1500 06/30/22 1515 06/30/22 1530 06/30/22 1545  BP: (!) 118/49 (!) 120/52 (!) 119/50 (!) 119/50  Pulse: 66  66 65 65  Resp: (!) 28 (!) 28 (!) 28 (!) 28  Temp: 99 F (37.2 C) 98.8 F (37.1 C) 98.6 F (37 C) 98.6 F (37 C)  TempSrc: Core     SpO2: 99% 100% 100% 100%  Weight:      Height:       General exam: Sedated, intubated and mechanically ventilated; spiking fevers.  Signs of fluid overload appreciated. Respiratory system: Positive rhonchi and fine crackles at the bases; no wheezing appreciated. Cardiovascular system: Rate controlled, no rubs, no gallops, unable to properly assess JVD with body habitus.  1-2+ edema appreciated bilaterally. Gastrointestinal system: Abdomen is nondistended, soft and nontender. No organomegaly or masses felt. Normal bowel sounds heard. Central nervous system: No focal neurological deficits. Extremities: No cyanosis or clubbing; left transmetatarsal amputation with healed wound.  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 Skin: No petechiae. Psychiatry: Unable to properly assess with current sedation.  Data Reviewed: Basic metabolic panel: Sodium 169, potassium 4.0, chloride 105, bicarb 20, BUN 56, creatinine 2.93 CBC: WBCs 9.5, hemoglobin 7.8 and platelet count 163 K Magnesium 1.7 Phosphorus 3.0  Family Communication: No family at bedside.  Disposition: Status is: Inpatient Remains inpatient appropriate because: Continue treatment for ARDS, multifocal pneumonia and pulmonary edema due to acute on chronic systolic heart failure.   Planned Discharge Destination: Skilled nursing facility  Time spent: 60 minutes  Author: Barton Dubois, MD 06/30/2022 4:15 PM  For on call review [email or web address].

## 2022-06-30 NOTE — Progress Notes (Signed)
WUA terminated after 57mins. Patient became agitated and not in synchrony with the vent. Sedation resumed at 100%. RT to increase the vent settings and reposition the ETT. Will continue to monitor and endorse.

## 2022-07-01 ENCOUNTER — Inpatient Hospital Stay (HOSPITAL_COMMUNITY): Payer: Medicare Other

## 2022-07-01 DIAGNOSIS — J189 Pneumonia, unspecified organism: Secondary | ICD-10-CM | POA: Diagnosis not present

## 2022-07-01 DIAGNOSIS — I10 Essential (primary) hypertension: Secondary | ICD-10-CM | POA: Diagnosis not present

## 2022-07-01 DIAGNOSIS — R7989 Other specified abnormal findings of blood chemistry: Secondary | ICD-10-CM | POA: Diagnosis not present

## 2022-07-01 DIAGNOSIS — J1100 Influenza due to unidentified influenza virus with unspecified type of pneumonia: Secondary | ICD-10-CM

## 2022-07-01 DIAGNOSIS — I5023 Acute on chronic systolic (congestive) heart failure: Secondary | ICD-10-CM

## 2022-07-01 DIAGNOSIS — J9601 Acute respiratory failure with hypoxia: Secondary | ICD-10-CM | POA: Diagnosis not present

## 2022-07-01 LAB — BASIC METABOLIC PANEL
Anion gap: 9 (ref 5–15)
BUN: 68 mg/dL — ABNORMAL HIGH (ref 6–20)
CO2: 19 mmol/L — ABNORMAL LOW (ref 22–32)
Calcium: 7.7 mg/dL — ABNORMAL LOW (ref 8.9–10.3)
Chloride: 107 mmol/L (ref 98–111)
Creatinine, Ser: 2.99 mg/dL — ABNORMAL HIGH (ref 0.61–1.24)
GFR, Estimated: 23 mL/min — ABNORMAL LOW (ref >60)
Glucose, Bld: 247 mg/dL — ABNORMAL HIGH (ref 70–99)
Potassium: 4.8 mmol/L (ref 3.5–5.1)
Sodium: 135 mmol/L (ref 135–145)

## 2022-07-01 LAB — CBC
HCT: 24.2 % — ABNORMAL LOW (ref 39.0–52.0)
HCT: 23.6 % — ABNORMAL LOW (ref 39.0–52.0)
Hemoglobin: 7.8 g/dL — ABNORMAL LOW (ref 13.0–17.0)
Hemoglobin: 7.6 g/dL — ABNORMAL LOW (ref 13.0–17.0)
MCH: 29.3 pg (ref 26.0–34.0)
MCH: 29.7 pg (ref 26.0–34.0)
MCHC: 32.2 g/dL (ref 30.0–36.0)
MCHC: 32.2 g/dL (ref 30.0–36.0)
MCV: 91 fL (ref 80.0–100.0)
MCV: 92.2 fL (ref 80.0–100.0)
Platelets: 163 10*3/uL (ref 150–400)
Platelets: 162 10*3/uL (ref 150–400)
RBC: 2.66 MIL/uL — ABNORMAL LOW (ref 4.22–5.81)
RBC: 2.56 MIL/uL — ABNORMAL LOW (ref 4.22–5.81)
RDW: 14.1 % (ref 11.5–15.5)
RDW: 14.3 % (ref 11.5–15.5)
WBC: 9.5 10*3/uL (ref 4.0–10.5)
WBC: 8.1 10*3/uL (ref 4.0–10.5)
nRBC: 0 % (ref 0.0–0.2)
nRBC: 0 % (ref 0.0–0.2)

## 2022-07-01 LAB — BLOOD GAS, ARTERIAL
Acid-base deficit: 3.8 mmol/L — ABNORMAL HIGH (ref 0.0–2.0)
Acid-base deficit: 7 mmol/L — ABNORMAL HIGH (ref 0.0–2.0)
Acid-base deficit: 6.2 mmol/L — ABNORMAL HIGH (ref 0.0–2.0)
Bicarbonate: 20.7 mmol/L (ref 20.0–28.0)
Bicarbonate: 21.5 mmol/L (ref 20.0–28.0)
Bicarbonate: 21.6 mmol/L (ref 20.0–28.0)
Drawn by: 10555
Drawn by: 30136
Drawn by: 27407
FIO2: 40 %
FIO2: 100 %
O2 Saturation: 97.7 %
O2 Saturation: 94.4 %
O2 Saturation: 98.2 %
Patient temperature: 37
Patient temperature: 36.3
Patient temperature: 36.3
pCO2 arterial: 35 mmHg (ref 32–48)
pCO2 arterial: 57 mmHg — ABNORMAL HIGH (ref 32–48)
pCO2 arterial: 52 mmHg — ABNORMAL HIGH (ref 32–48)
pH, Arterial: 7.38 (ref 7.35–7.45)
pH, Arterial: 7.18 — CL (ref 7.35–7.45)
pH, Arterial: 7.22 — ABNORMAL LOW (ref 7.35–7.45)
pO2, Arterial: 91 mmHg (ref 83–108)
pO2, Arterial: 69 mmHg — ABNORMAL LOW (ref 83–108)
pO2, Arterial: 84 mmHg (ref 83–108)

## 2022-07-01 LAB — GLUCOSE, CAPILLARY
Glucose-Capillary: 142 mg/dL — ABNORMAL HIGH (ref 70–99)
Glucose-Capillary: 207 mg/dL — ABNORMAL HIGH (ref 70–99)
Glucose-Capillary: 212 mg/dL — ABNORMAL HIGH (ref 70–99)
Glucose-Capillary: 212 mg/dL — ABNORMAL HIGH (ref 70–99)
Glucose-Capillary: 216 mg/dL — ABNORMAL HIGH (ref 70–99)
Glucose-Capillary: 228 mg/dL — ABNORMAL HIGH (ref 70–99)

## 2022-07-01 LAB — LEGIONELLA PNEUMOPHILA SEROGP 1 UR AG: L. pneumophila Serogp 1 Ur Ag: NEGATIVE

## 2022-07-01 MED ORDER — ORAL CARE MOUTH RINSE
15.0000 mL | OROMUCOSAL | Status: DC
  Administered 2022-07-01 – 2022-07-09 (×93): 15 mL via OROMUCOSAL

## 2022-07-01 MED ORDER — ORAL CARE MOUTH RINSE: 15.0000 mL | OROMUCOSAL | Status: DC | PRN

## 2022-07-01 MED ORDER — IPRATROPIUM-ALBUTEROL 0.5-2.5 (3) MG/3ML IN SOLN
3.0000 mL | RESPIRATORY_TRACT | Status: DC | PRN
  Administered 2022-07-05 – 2022-07-10 (×9): 3 mL via RESPIRATORY_TRACT
  Filled 2022-07-01 (×9): qty 3

## 2022-07-01 MED ORDER — INSULIN ASPART 100 UNIT/ML IJ SOLN: 0.0000 [IU] | INTRAMUSCULAR | Status: DC

## 2022-07-01 MED ORDER — ONDANSETRON HCL 4 MG PO TABS: 4.0000 mg | ORAL_TABLET | Freq: Four times a day (QID) | ORAL | Status: DC | PRN

## 2022-07-01 MED ORDER — FUROSEMIDE 10 MG/ML IJ SOLN
40.0000 mg | Freq: Once | INTRAMUSCULAR | Status: AC
  Administered 2022-07-01: 40 mg via INTRAVENOUS
  Filled 2022-07-01: qty 4

## 2022-07-01 MED ORDER — ONDANSETRON HCL 4 MG/2ML IJ SOLN
4.0000 mg | Freq: Four times a day (QID) | INTRAMUSCULAR | Status: DC | PRN
  Administered 2022-07-06 – 2022-07-07 (×2): 4 mg via INTRAVENOUS
  Filled 2022-07-01 (×2): qty 2

## 2022-07-01 MED ORDER — INSULIN ASPART 100 UNIT/ML IJ SOLN
0.0000 [IU] | INTRAMUSCULAR | Status: DC
  Administered 2022-07-01: 3 [IU] via SUBCUTANEOUS
  Administered 2022-07-01: 1 [IU] via SUBCUTANEOUS
  Administered 2022-07-02 (×2): 3 [IU] via SUBCUTANEOUS
  Administered 2022-07-02: 2 [IU] via SUBCUTANEOUS
  Administered 2022-07-02 (×2): 3 [IU] via SUBCUTANEOUS
  Administered 2022-07-02: 2 [IU] via SUBCUTANEOUS
  Administered 2022-07-03 (×2): 3 [IU] via SUBCUTANEOUS
  Administered 2022-07-03: 2 [IU] via SUBCUTANEOUS

## 2022-07-01 NOTE — Inpatient Diabetes Management (Signed)
Inpatient Diabetes Program Recommendations  AACE/ADA: New Consensus Statement on Inpatient Glycemic Control   Target Ranges:  Prepandial:   less than 140 mg/dL      Peak postprandial:   less than 180 mg/dL (1-2 hours)      Critically ill patients:  140 - 180 mg/dL    Latest Reference Range & Units 06/30/22 07:41 06/30/22 11:09 06/30/22 15:07 06/30/22 19:25 06/30/22 21:50 06/30/22 23:22 07/01/22 04:24 07/01/22 07:34  Glucose-Capillary 70 - 99 mg/dL 89 99 133 (H) 173 (H) 196 (H) 224 (H) 228 (H) 207 (H)   Review of Glycemic Control  Diabetes history: DM2 Outpatient Diabetes medications: Lantus 40 units QHS Current orders for Inpatient glycemic control: Semglee 10 units QHS, Novolog 0-9 units TID with meals, Novolog 0-5 units QHS; Solumedrol 40 mg daily, Vital @ 55 ml/hr  Inpatient Diabetes Program Recommendations:    Insulin: Please consider changing Novolog correction to 0-9 units Q4H and  ordering Novolog 3 units Q4H for tube feeding coverage. If tube feeding is stopped or held then Novolog tube feeding coverage should also be stopped or held.  Thanks, Barnie Alderman, RN, MSN, Trainer Diabetes Coordinator Inpatient Diabetes Program [phone] (Team Pager from 8am to [place])

## 2022-07-01 NOTE — Progress Notes (Signed)
CRITICAL VALUE STICKER  CRITICAL VALUE: PH 7.18  RECEIVER (on-site recipient of call): Nnaemeka Samson, rn   DATE & TIME NOTIFIED: 11/27 @1730   MESSENGER (representative from lab): unknown  MD NOTIFIED: Dr. Halford Chessman  TIME OF NOTIFICATION: 1731  RESPONSE:  Awaiting response

## 2022-07-01 NOTE — Consult Note (Signed)
NAME:  Oscar Rose, MRN:  [id number], DOB:  24-Dec-1960, LOS: 2 ADMISSION DATE:  06/29/2022, CONSULTATION DATE:  07/01/2022 REFERRING MD:  Dr. Dyann Kief, [facility], CHIEF COMPLAINT:  Respiratory failure   History of Present Illness:  61 yo male resident of [facility] was found unresponsive with SpO2 8%.  In ER he was diaphoretic with paradoxical breathing.  He required intubation.  He was found to have pneumonia from Influenza A and acute pulmonary edema.  He was in hospital from 05/12/22 to 05/18/22 with multifocal pneumonia.    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    Objective   Blood pressure (!) 118/47, pulse 81, temperature 98.6 F (37 C), temperature source Bladder, resp. rate (!) 35, height 5\' 11"  (1.803 m), weight 126.6 kg, SpO2 97 %.    Vent Mode: PRVC FiO2 (%):  [35 %-70 %] 35 % Set Rate:  [28 bmp-35 bmp] 35 bmp Vt Set:  [450 mL-600 mL] 450 mL PEEP:  [5 cmH20] 5 cmH20 Plateau Pressure:  [15 cmH20-29 cmH20] 19 cmH20   Intake/Output Summary (Last 24 hours) at 07/01/2022 1244 Last data filed at 07/01/2022 0617 Gross per 24 hour  Intake 2335.41 ml  Output 2850 ml  Net -514.59 ml   Filed Weights   06/29/22 0841 06/30/22 0406 07/01/22 0421  Weight: 120.8 kg 125.4 kg 126.6 kg    Examination:  General - sedated Eyes - pupils reactive ENT - ETT in place Cardiac - regular rate/rhythm, no murmur Chest - bilateral rhonchi, no wheeze Abdomen - soft, non tender, + bowel sounds Extremities - Lt foot TMA Skin - no rashes Neuro - RASS -3  Resolved Hospital Problem list     Assessment & Plan:   Acute hypoxic  respiratory failure from Influenza pneumonia with possible bacterial superinfection and acute pulmonary edema. - respiratory mechanics and oxygen level currently not consistent with ARDS - day 3 of tamiflu and cefepime/zithromax - goal SpO2 > 92% - f/u CXR - negative fluid balance as tolerated - wean off solumedrol as tolerated - prn BDs  Acute on chronic HFrEF with baseline EF 45 to 50% from 05/13/22. Elevated troponin from demand ischemia. Hx of HTN, HLD. - continue ASA, lipitor, plavix, lasix - defer restarting outpt norvasc, hydralazine, cozaar to primary team  DM type 2 poorly controlled with steroid induced hyperglycemia. -  SSI with semglee   Anemia of critical illness and chronic disease. - f/u CBC - transfuse for Hb < 7 - check iron levels  CKD 3b. - monitor urine outpt, f/u BMET  Best Practice (right click and "Reselect all SmartList Selections" daily)   Diet/type: tubefeeds DVT prophylaxis: LMWH GI prophylaxis: PPI Lines: N/A Foley:  N/A Code Status:  full code Last date of multidisciplinary goals of care discussion [x]   Labs   CBC: Recent Labs  Lab 06/29/22 0505 06/30/22 0556 07/01/22 0617  WBC 10.2 9.5 8.1  NEUTROABS 8.4*  --   --   HGB 10.2* 7.8* 7.6*  HCT 33.2* 24.2* 23.6*  MCV 95.1 91.0 92.2  PLT 303 163 403    Basic Metabolic Panel: Recent Labs  Lab 06/29/22 0505 06/29/22 0815 06/29/22 1315  06/29/22 1734 06/30/22 0556 06/30/22 1711 07/01/22 0335  NA 138  --   --   --  135  --  135  K 4.1  --   --   --  4.0  --  4.8  CL 104  --   --   --  105  --  107  CO2 20*  --   --   --  20*  --  19*  GLUCOSE 221*  --   --   --  111*  --  247*  BUN 33*  --   --   --  56*  --  68*  CREATININE 2.55*  --   --   --  2.93*  --  2.99*  CALCIUM 8.7*  --   --   --  8.0*  --  7.7*  MG  --  1.9 1.5* 1.5* 1.7 1.7  --   PHOS  --  5.1* 4.1 3.3 3.0 3.3  --    GFR: Estimated Creatinine Clearance: 36 mL/min (A) (by C-G formula based on SCr of 2.99 mg/dL  (H)). Recent Labs  Lab 06/29/22 0505 06/29/22 0720 06/30/22 0556 07/01/22 0617  WBC 10.2  --  9.5 8.1  LATICACIDVEN 4.0* 1.8  --   --     Liver Function Tests: Recent Labs  Lab 06/29/22 0505  AST 60*  ALT 37  ALKPHOS 126  BILITOT 0.8  PROT 7.5  ALBUMIN 3.6   No results for input(s): "LIPASE", "AMYLASE" in the last 168 hours. No results for input(s): "AMMONIA" in the last 168 hours.  ABG    Component Value Date/Time   PHART 7.38 07/01/2022 0140   PCO2ART 35 07/01/2022 0140   PO2ART 91 07/01/2022 0140   HCO3 20.7 07/01/2022 0140   TCO2 27 11/08/2021 0728   ACIDBASEDEF 3.8 (H) 07/01/2022 0140   O2SAT 97.7 07/01/2022 0140     Coagulation Profile: No results for input(s): "INR", "PROTIME" in the last 168 hours.  Cardiac Enzymes: No results for input(s): "CKTOTAL", "CKMB", "CKMBINDEX", "TROPONINI" in the last 168 hours.  HbA1C: Hgb A1c MFr Bld  Date/Time Value Ref Range Status  05/13/2022 01:27 AM 5.5 4.8 - 5.6 % Final    Comment:    (NOTE) Pre diabetes:          5.7%-6.4%  Diabetes:              >6.4%  Glycemic control for   <7.0% adults with diabetes   04/04/2016 04:10 PM 8.3 (H) 4.8 - 5.6 % Final    Comment:    (NOTE)         Pre-diabetes: 5.7 - 6.4         Diabetes: >6.4         Glycemic control for adults with diabetes: <7.0     CBG: Recent Labs  Lab 06/30/22 2150 06/30/22 2322 07/01/22 0424 07/01/22 0734 07/01/22 1104  GLUCAP 196* 224* 228* 207* 216*    Review of Systems:   Unable to obtain.  Past Medical History:  He,  has a past medical history of Anemia, Arthritis, At risk for sleep apnea, CKD (chronic kidney disease), stage II, Depression, Diabetic neuropathy ([place]), GERD (gastroesophageal reflux disease), Headache, History of kidney stones, History of retinal detachment, Hyperlipidemia, Hypertension, Legal blindness of left eye, as defined in [place] Neuropathy, PVD (peripheral vascular disease) ([place]), Restless leg syndrome, Retained  ureteral stent, Rotator cuff syndrome of right shoulder, Sleep apnea in adult, Stroke [facility]), Type 2  diabetes mellitus ([place]), and Vitamin D deficiency.   Surgical History:   Past Surgical History:  Procedure Laterality Date   ABDOMINAL AORTOGRAM W/LOWER EXTREMITY N/A 11/08/2021   Procedure: ABDOMINAL AORTOGRAM W/LOWER EXTREMITY;  Surgeon: Marty Heck, MD;  Location: [facility];  Service: Cardiovascular;  Laterality: N/A;   AMPUTATION Bilateral 2012   Left big toe partial and right big toe complete   CARDIOVASCULAR STRESS TEST  04-05-2014  dr croitoru   low risk lexiscan nuclear study with mild diaphragmatic attenuation artifact/  no ischemia/  ef 58%   CATARACT EXTRACTION W/ INTRAOCULAR LENS  IMPLANT, BILATERAL  2013   CYSTO /  LEFT URETERAL STENT PLACEMENT  2012   CYSTOSCOPY W/ URETERAL STENT REMOVAL Left 07/07/2014   Procedure: CYSTO WITH LEFT PORTION STENT REMOVAL;  Surgeon: Malka So, MD;  Location: [facility];  Service: Urology;  Laterality: Left;   CYSTOSCOPY WITH URETEROSCOPY AND STENT PLACEMENT Left 07/07/2014   Procedure: CYSTOLITHALOPAXY URETEROSCOPY WITH STENT;  Surgeon: Malka So, MD;  Location: [facility];  Service: Urology;  Laterality: Left;   ENDARTERECTOMY Right 04/04/2016   Procedure: ENDARTERECTOMY CAROTID ARTERY RIGHT;  Surgeon: Angelia Mould, MD;  Location: [facility];  Service: Vascular;  Laterality: Right;   HOLMIUM LASER APPLICATION N/A 66/0/6301   Procedure: HOLMIUM LASER APPLICATION;  Surgeon: Malka So, MD;  Location: [facility];  Service: Urology;  Laterality: N/A;   I & D  INFECTED SPIDER BITE UPPER BACK  06/ 2012   NEPHROLITHOTOMY Left 08/16/2014   Procedure: LEFT NEPHROLITHOTOMY PERCUTANEOUS;  Surgeon: Malka So, MD;  Location: [facility];  Service: Urology;  Laterality: Left;   NEPHROLITHOTOMY Left 08/18/2014   Procedure: LEFT NEPHROLITHOTOMY PERCUTANEOUS SECOND LOOK;  Surgeon: Malka So,  MD;  Location: [facility];  Service: Urology;  Laterality: Left;   PATCH ANGIOPLASTY Right 04/04/2016   Procedure: PATCH ANGIOPLASTY RIGHT CAROTID ARTERY;  Surgeon: Angelia Mould, MD;  Location: [facility];  Service: Vascular;  Laterality: Right;   PERIPHERAL VASCULAR BALLOON ANGIOPLASTY  11/08/2021   Procedure: PERIPHERAL VASCULAR BALLOON ANGIOPLASTY;  Surgeon: Marty Heck, MD;  Location: [facility];  Service: Cardiovascular;;  Left AT   RETINAL DETACHMENT SURGERY Bilateral 2013   TONSILLECTOMY AND ADENOIDECTOMY  as child     Social History:   reports that he has never smoked. He has never used smokeless tobacco. He reports that he does not currently use alcohol. He reports that he does not use drugs.   Family History:  His family history includes Cancer in his mother. There is no history of Stroke.   Allergies No Known Allergies   Home Medications  Prior to Admission medications   Medication Sig Start Date End Date Taking? Authorizing Provider  Amino Acids-Protein Hydrolys (PRO-STAT PO) Take 30 mLs by mouth in the morning and at bedtime.   Yes [provider]  amLODipine (NORVASC) 10 MG tablet Take 10 mg by mouth daily.   Yes [provider]  aspirin EC 81 MG tablet Take 81 mg by mouth daily.   Yes [provider]  atorvastatin (LIPITOR) 40 MG tablet Take 40 mg by mouth daily.   Yes [provider]  Cholecalciferol 1.25 MG (50000 UT) TABS Take 1.25 mg by mouth once a week. Mondays   Yes [provider]  clopidogrel (PLAVIX) 75 MG tablet Take 1 tablet (75 mg total) by mouth daily. 11/08/21 11/08/22 Yes Monica Martinez  J, MD  ferrous sulfate 325 (65 FE) MG tablet Take 325 mg by mouth daily with breakfast.   Yes [provider]  hydrALAZINE (APRESOLINE) 100 MG tablet Take 1 tablet (100 mg total) by mouth every 8 (eight) hours. 05/18/22  Yes Shahmehdi, Valeria Batman, MD  icosapent Ethyl (VASCEPA) 1 g capsule Take 2 g by mouth 2  (two) times daily. 02/01/22  Yes [provider]  insulin glargine (LANTUS) 100 UNIT/ML injection Inject 40 Units into the skin at bedtime.   Yes [provider]  losartan (COZAAR) 25 MG tablet Take 25 mg by mouth daily.   Yes [provider]  Multiple Vitamin (MULTIVITAMIN) tablet Take 1 tablet by mouth daily.   Yes [provider]  Omega-3 1000 MG CAPS Take 1 capsule by mouth every morning.   Yes [provider]  OYSTER SHELL CALCIUM PO Take 500 mg by mouth daily.   Yes [provider]  tamsulosin (FLOMAX) 0.4 MG CAPS capsule Take 1 capsule (0.4 mg total) by mouth daily after supper. 05/18/22 07/17/22 Yes Shahmehdi, Valeria Batman, MD  vitamin C (ASCORBIC ACID) 500 MG tablet Take 1,000 mg by mouth 2 (two) times daily.   Yes [provider]  OXYGEN Inhale 4 L into the lungs as needed (hypoxia).    [provider]     Critical care time: 39 minutes  Chesley Mires, MD [place] Pager - [phone] 07/01/2022, 1:23 PM

## 2022-07-01 NOTE — Progress Notes (Signed)
Sudden increase in O2 needs.  CXR shows progression of pulmonary edema.  Will give additional 40 mg lasix IV x one and check ABG in 2 hours.  Chesley Mires, MD [place] Pager - [phone] 07/01/2022, 3:17 PM

## 2022-07-01 NOTE — Progress Notes (Signed)
Progress Note   [name] JSH:[id number] DOB: Jul 13, 1961 DOA: 06/29/2022     2 DOS: the patient was seen and examined on 07/01/2022   Brief hospital course: RAHSAAN WEAKLAND is a 61 y.o. male with medical history significant of type 2 diabetes with nephropathy, gastroesophageal reflux disease, hypertension, hyperlipidemia, chronic kidney disease stage IIIb, chronic respiratory failure, sleep apnea, class II obesity and recent hospitalization with multifocal pneumonia; presented from his extended care facility secondary to worsening shortness of breath and hypoxia.  On arrival to the emergency department patient condition further deteriorates and he was found to be obtunded with oxygen saturation in the mid 60s.  EMS personnel transported him providing bagging with bag-valve-mask.  Patient was intubated, mechanically ventilated and sedated.  At time of my evaluation unable to answer any questions or provide any history. Per EDP notes, EMS reports and information from his facility no chest pain, no nausea, no vomiting, no dysuria, no melena, no hematochezia, no focal deficits or any other complaints.   Workup in the ED demonstrating vascular congestion, pulmonary edema with concern for ARDS and multifocal pneumonia.  Patient COVID PCR was negative but he was found to be positive for influenza A.  TRH has been consulted to place patient in the hospital for further evaluation and management.  Assessment and Plan: * Acute respiratory failure with hypoxia (HCC) -Acute respiratory failure with hypoxia secondary to pulmonary edema/acute on chronic systolic heart failure, influenza and concerns for multifocal pneumonia. -Still requiring ventilatory support; patient is a spiking fever -Continue treatment with Tamiflu, current antibiotic, bronchodilators, steroids and IV Lasix -Follow clinical results and wean off ventilatory support as tolerated. -Still not ready. -Appreciate assistance and  recommendation by PCCM; repeat chest x-ray 8 this morning demonstrated improved variation.. -Stable ABG.  HCAP (healthcare-associated pneumonia) -continue current antibiotics. -Continue to wean patient off ventilatory support as tolerated. -Continue bronchodilators.  CKD (chronic kidney disease) stage 3B -renal function appears to be stable overall -Continue to follow renal function trend with acute diuresis process -Foley catheter in place in the setting of acute critical ill; no complaints of retention and urinalysis not suggesting UTI. -Reassess for early discontinuation in the next 24-48 hours.   ARDS (adult respiratory distress syndrome) (HCC) -Continue IV diuresis -Continue ARDS protocol and his ventilatory settings -Follow-up PCCM recommendations.  Elevated troponin -Appears to be demand ischemia in presentation due to acute on chronic heart failure. -No ischemic changes appreciated on telemetry -Continue to follow clinical response -Continue treatment with Lipitor, aspirin and Plavix   Influenza A with pneumonia -Continue treatment with Tamiflu -Continue bronchodilator management and the use of a steroid -Follow clinical response.  Acute on chronic systolic CHF (congestive heart failure) (HCC) -Elevated BNP, ARDS changes appreciated on chest images -Continue IV diuresis -ARDS protocol on his ventilatory settings. -Recent echo in October 2023 demonstrating ejection fraction 45 to 50%. -Continue to follow daily weights and strict intake and output.  Type 2 diabetes mellitus (HCC) -With nephropathy; chronic kidney disease a stage IIIb. -Continue sliding scale insulin and the use of long-acting -Follow CBGs and adjust hypoglycemic regimen as needed -Recent A1c 5.5; Holding on repeating levels.  Hypertension -Overall stable -Continue current antihypertensive agent -Follow-up vital signs.   Subjective:  Afebrile, no nausea or vomiting.  Intubated, sedated and  mechanically ventilated.  ABG appears stable and repeat x-ray this morning noticed improvement in ariation.  Physical Exam: Vitals:   07/01/22 0429 07/01/22 0500 07/01/22 0600 07/01/22 0734  BP: (!) 119/46 Marland Kitchen)  118/45 (!) 110/52   Pulse: 70 69 65 64  Resp: (!) 35 (!) 35 17 (!) 35  Temp: 98.1 F (36.7 C) 98.1 F (36.7 C) 98.4 F (36.9 C) 98.2 F (36.8 C)  TempSrc:    Bladder  SpO2: 98% 98% 95% 98%  Weight:      Height: 5\' 11"  (1.803 m)      General exam: Sedated, intubated and mechanically ventilated.  No fever. Respiratory system: Decreased breath sounds at the bases; positive rhonchi.  No wheezing Cardiovascular system:RRR. No rubs or gallops; unable to assess JVD with body habitus. Gastrointestinal system: Abdomen is obese, nondistended, soft and nontender. No organomegaly or masses felt. Normal bowel sounds heard. Central nervous system: Limited examination with current sedation. Extremities: No cyanosis or clubbing; left transmetatarsal amputation appreciated.  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  2+ edema appreciated bilaterally. Skin: No petechiae. Psychiatry: Unable to assess given current sedation.  Data Reviewed: Basic metabolic panel: Sodium 759, potassium 4.0, chloride 105, bicarb 20, BUN 56, creatinine 2.93 CBC: WBCs 9.5, hemoglobin 7.8 and platelet count 163 K Magnesium 1.7 Phosphorus 3.0  Family Communication: No family at bedside.  Disposition: Status is: Inpatient Remains inpatient appropriate because: Continue treatment for ARDS, multifocal pneumonia and pulmonary edema due to acute on chronic systolic heart failure.   Planned Discharge Destination: Skilled nursing facility  Time spent: 60 minutes  Author: Barton Dubois, MD 07/01/2022 9:13 AM  For on call review [email or web address].

## 2022-07-01 NOTE — Progress Notes (Signed)
New vent settings order per Dr. Halford Chessman, see orders

## 2022-07-01 NOTE — Progress Notes (Signed)
RT notified of patient's critical results. CCM paged to put in an order for vent changes.

## 2022-07-01 NOTE — Progress Notes (Signed)
Changed to ARDS vent management as requested earlier. VT decreased to 6 cc kg,  Rate increased to 35 from 28 -- chasing 16. 8 minute volume.  Blood gas pending.

## 2022-07-01 NOTE — Progress Notes (Signed)
Latest Reference Range & Units 07/01/22 01:40  FIO2 % 40.0  pH, Arterial 7.35 - 7.45  7.38  pCO2 arterial 32 - 48 mmHg 35  pO2, Arterial 83 - 108 mmHg 91  Acid-base deficit 0.0 - 2.0 mmol/L 3.8 (H)  Bicarbonate 20.0 - 28.0 mmol/L 20.7  O2 Saturation % 97.7  Patient temperature  37.0  Collection site  RIGHT RADIAL  Allens test (pass/fail) PASS  PENDING   Patients VT at 450 , Rate 35, FiO2 40. Peep 5  Patient now on ARDS at 6 cc / Kg

## 2022-07-01 NOTE — Progress Notes (Signed)
Patty with [facility] called to see if they can have a CCM MD look at patients ABG for vent setting adjustments. Awaiting response.

## 2022-07-01 NOTE — Assessment & Plan Note (Addendum)
-  continue current antibiotics. -Continue to wean patient off ventilatory support as tolerated. -Continue bronchodilators.

## 2022-07-01 NOTE — Progress Notes (Signed)
Sedation decreased by 25% to see if patient could wake up enough for a weaning trial. 30 minutes after sedation decreased patient became blue in the face and oxygen desaturated to 64%. This RN gave O2 breaths at 100% FiO2 and suctioned patient. No mucus plug found, nor did patient cough. This RN called for RT and retrieved and ambu bag. RT arrived and bagged patient at 100% FiO2. Patient desaturated the lowest at 32% but then gradually came back up to 99%. Dr. Halford Chessman & [place] paged and made aware. STAT CXR ordered.

## 2022-07-01 NOTE — Progress Notes (Signed)
ABG collected at 1720 post vent changes.

## 2022-07-02 ENCOUNTER — Inpatient Hospital Stay (HOSPITAL_COMMUNITY): Payer: Medicare Other

## 2022-07-02 ENCOUNTER — Encounter (HOSPITAL_COMMUNITY): Payer: Self-pay

## 2022-07-02 DIAGNOSIS — J9601 Acute respiratory failure with hypoxia: Secondary | ICD-10-CM | POA: Diagnosis not present

## 2022-07-02 DIAGNOSIS — R7989 Other specified abnormal findings of blood chemistry: Secondary | ICD-10-CM | POA: Diagnosis not present

## 2022-07-02 DIAGNOSIS — I10 Essential (primary) hypertension: Secondary | ICD-10-CM | POA: Diagnosis not present

## 2022-07-02 DIAGNOSIS — E875 Hyperkalemia: Secondary | ICD-10-CM

## 2022-07-02 DIAGNOSIS — N179 Acute kidney failure, unspecified: Secondary | ICD-10-CM

## 2022-07-02 DIAGNOSIS — J189 Pneumonia, unspecified organism: Secondary | ICD-10-CM | POA: Diagnosis not present

## 2022-07-02 LAB — BASIC METABOLIC PANEL
Anion gap: 9 (ref 5–15)
Anion gap: 13 (ref 5–15)
BUN: 86 mg/dL — ABNORMAL HIGH (ref 6–20)
BUN: 91 mg/dL — ABNORMAL HIGH (ref 6–20)
CO2: 20 mmol/L — ABNORMAL LOW (ref 22–32)
CO2: 17 mmol/L — ABNORMAL LOW (ref 22–32)
Calcium: 7.7 mg/dL — ABNORMAL LOW (ref 8.9–10.3)
Calcium: 7.9 mg/dL — ABNORMAL LOW (ref 8.9–10.3)
Chloride: 106 mmol/L (ref 98–111)
Chloride: 106 mmol/L (ref 98–111)
Creatinine, Ser: 3.66 mg/dL — ABNORMAL HIGH (ref 0.61–1.24)
Creatinine, Ser: 3.59 mg/dL — ABNORMAL HIGH (ref 0.61–1.24)
GFR, Estimated: 18 mL/min — ABNORMAL LOW (ref >60)
GFR, Estimated: 19 mL/min — ABNORMAL LOW (ref >60)
Glucose, Bld: 205 mg/dL — ABNORMAL HIGH (ref 70–99)
Glucose, Bld: 259 mg/dL — ABNORMAL HIGH (ref 70–99)
Potassium: 6.2 mmol/L — ABNORMAL HIGH (ref 3.5–5.1)
Potassium: 5.2 mmol/L — ABNORMAL HIGH (ref 3.5–5.1)
Sodium: 135 mmol/L (ref 135–145)
Sodium: 136 mmol/L (ref 135–145)

## 2022-07-02 LAB — IRON AND TIBC
Iron: 41 ug/dL — ABNORMAL LOW (ref 45–182)
Saturation Ratios: 21 % (ref 17.9–39.5)
TIBC: 195 ug/dL — ABNORMAL LOW (ref 250–450)
UIBC: 154 ug/dL

## 2022-07-02 LAB — CBC
HCT: 27 % — ABNORMAL LOW (ref 39.0–52.0)
Hemoglobin: 8.3 g/dL — ABNORMAL LOW (ref 13.0–17.0)
MCH: 29.3 pg (ref 26.0–34.0)
MCHC: 30.7 g/dL (ref 30.0–36.0)
MCV: 95.4 fL (ref 80.0–100.0)
Platelets: 191 10*3/uL (ref 150–400)
RBC: 2.83 MIL/uL — ABNORMAL LOW (ref 4.22–5.81)
RDW: 14.6 % (ref 11.5–15.5)
WBC: 9.6 10*3/uL (ref 4.0–10.5)
nRBC: 0 % (ref 0.0–0.2)

## 2022-07-02 LAB — GLUCOSE, CAPILLARY
Glucose-Capillary: 185 mg/dL — ABNORMAL HIGH (ref 70–99)
Glucose-Capillary: 184 mg/dL — ABNORMAL HIGH (ref 70–99)
Glucose-Capillary: 219 mg/dL — ABNORMAL HIGH (ref 70–99)
Glucose-Capillary: 229 mg/dL — ABNORMAL HIGH (ref 70–99)
Glucose-Capillary: 213 mg/dL — ABNORMAL HIGH (ref 70–99)

## 2022-07-02 LAB — RETICULOCYTES
Immature Retic Fract: 9.7 % (ref 2.3–15.9)
RBC.: 2.87 MIL/uL — ABNORMAL LOW (ref 4.22–5.81)
Retic Count, Absolute: 24.4 10*3/uL (ref 19.0–186.0)
Retic Ct Pct: 0.9 % (ref 0.4–3.1)

## 2022-07-02 LAB — FERRITIN: Ferritin: 757 ng/mL — ABNORMAL HIGH (ref 24–336)

## 2022-07-02 MED ORDER — INSULIN ASPART 100 UNIT/ML IV SOLN
10.0000 [IU] | Freq: Once | INTRAVENOUS | Status: AC
  Administered 2022-07-02: 10 [IU] via INTRAVENOUS

## 2022-07-02 MED ORDER — CALCIUM GLUCONATE-NACL 1-0.675 GM/50ML-% IV SOLN
1.0000 g | Freq: Once | INTRAVENOUS | Status: AC
  Administered 2022-07-02: 1000 mg via INTRAVENOUS
  Filled 2022-07-02: qty 50

## 2022-07-02 MED ORDER — ENOXAPARIN SODIUM 30 MG/0.3ML IJ SOSY
30.0000 mg | PREFILLED_SYRINGE | INTRAMUSCULAR | Status: DC
  Administered 2022-07-02 – 2022-07-04 (×3): 30 mg via SUBCUTANEOUS
  Filled 2022-07-02 (×3): qty 0.3

## 2022-07-02 MED ORDER — SODIUM ZIRCONIUM CYCLOSILICATE 10 G PO PACK: 10.0000 g | PACK | Freq: Once | ORAL | Status: DC

## 2022-07-02 MED ORDER — SODIUM ZIRCONIUM CYCLOSILICATE 10 G PO PACK
10.0000 g | PACK | Freq: Two times a day (BID) | ORAL | Status: AC
  Administered 2022-07-02 (×2): 10 g via ORAL
  Filled 2022-07-02 (×2): qty 1

## 2022-07-02 NOTE — Progress Notes (Addendum)
CRITICAL VALUE STICKER  CRITICAL VALUE: 6.2 Potassium   RECEIVER (on-site recipient of call):  [place] NOTIFIED: [phone]  MESSENGER (representative from lab):didn't receive a call  MD NOTIFIED: Yes,  TIME OF NOTIFICATION:6:56  RESPONSE:  not received yet

## 2022-07-02 NOTE — Progress Notes (Signed)
Significant increase in O2 requirements and more difficult to ventilate over the past 24 hours.  Only marginal output with diuresis and renal function getting worse.   D/w Dr. Dyann Kief.  Will need to arrange for transfer to [facility] for further critical care management.  Chesley Mires, MD [place] Pager - [phone] 07/02/2022, 8:48 AM

## 2022-07-02 NOTE — Progress Notes (Signed)
NAME:  Oscar Rose, MRN:  [id number], DOB:  1961-01-05, LOS: 3 ADMISSION DATE:  06/29/2022, CONSULTATION DATE:  07/01/2022 REFERRING MD:  Dr. Dyann Kief, [facility], CHIEF COMPLAINT:  Respiratory failure   History of Present Illness:  61 yo male resident of [facility] was found unresponsive with SpO2 8%.  In ER he was diaphoretic with paradoxical breathing.  He required intubation.  He was found to have pneumonia from Influenza A and acute pulmonary edema.  He was in hospital from 05/12/22 to 05/18/22 with multifocal pneumonia.    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  Renal fx worse after diuresis.  Objective   Blood pressure (!) 146/54, pulse 82, temperature 97.7 F (36.5 C), temperature source Bladder, resp. rate (!) 28, height 5\' 11"  (1.803 m), weight 125.3 kg, SpO2 96 %.    Vent Mode: PRVC FiO2 (%):  [35 %-100 %] 50 % Set Rate:  [22 bmp-35 bmp] 32 bmp Vt Set:  [450 mL-600 mL] 600 mL PEEP:  [5 YOV78-58 cmH20] 10 cmH20 Plateau Pressure:  [19 cmH20-28 cmH20] 28 cmH20   Intake/Output Summary (Last 24 hours) at 07/02/2022 1001 Last data filed at 07/02/2022 8502 Gross per 24 hour  Intake 2894.23 ml  Output 1125 ml  Net 1769.23 ml   Filed Weights   06/30/22 0406 07/01/22 0421 07/02/22 0500  Weight: 125.4 kg 126.6 kg 125.3 kg    Examination:  General - sedated Eyes - pupils reactive ENT - ETT in place Cardiac - regular rate/rhythm, no murmur Chest - b/l crackles Abdomen - soft, non tender, + bowel  sounds Extremities - 2+ edema Skin - no rashes Neuro - RASS -3  Resolved Hospital Problem list     Assessment & Plan:   Acute hypoxic/hypercapnic respiratory failure from Influenza pneumonia with possible bacterial superinfection and acute pulmonary edema. - will be difficult vent weaning; transfer to [facility] - goal SpO2 > 92% - f/u CXR, ABG - continue solumedrol - prn BDs  Acute on chronic HFrEF with baseline EF 45 to 50% from 05/13/22. Elevated troponin from demand ischemia. Hx of HTN, HLD. - continue ASA, lipitor, plavix, lasix -hold outpt norvasc, hydralazine, cozaar for now  DM type 2 poorly controlled with steroid induced hyperglycemia. -  SSI with semglee   Anemia of critical illness and chronic disease. - f/u CBC - transfuse for Hb < 7  AKI. Hyperkalemia. CKD 3b. - f/u BMET - might need renal replacement therapy  D/w Dr. Dyann Kief  Best Practice (right click and "Reselect all SmartList Selections" daily)   Diet/type: tubefeeds DVT prophylaxis: LMWH GI prophylaxis: PPI Lines: N/A Foley:  N/A Code Status:  full code Last date of multidisciplinary goals of care discussion [x]   Labs       Latest Ref Rng & Units 07/02/2022    4:24 AM 07/01/2022    3:35 AM 06/30/2022    5:56 AM  CMP  Glucose 70 - 99 mg/dL 205  247  111   BUN 6 - 20 mg/dL 86  68  56   Creatinine 0.61 - 1.24 mg/dL 3.66  2.99  2.93   Sodium 135 - 145 mmol/L 135  135  135   Potassium 3.5 - 5.1 mmol/L 6.2  4.8  4.0   Chloride 98 - 111 mmol/L 106  107  105   CO2 22 - 32 mmol/L 20  19  20    Calcium 8.9 - 10.3 mg/dL 7.7  7.7  8.0        Latest Ref Rng & Units 07/02/2022    4:24 AM 07/01/2022    6:17 AM 06/30/2022    5:56 AM  CBC  WBC 4.0 - 10.5 K/uL 9.6  8.1  9.5   Hemoglobin 13.0 - 17.0 g/dL 8.3  7.6  7.8   Hematocrit 39.0 - 52.0 % 27.0  23.6  24.2   Platelets 150 - 400 K/uL 191  162  163     ABG    Component Value Date/Time   PHART 7.22 (L) 07/01/2022 2005   PCO2ART 52 (H)  07/01/2022 2005   PO2ART 84 07/01/2022 2005   HCO3 21.6 07/01/2022 2005   TCO2 27 11/08/2021 0728   ACIDBASEDEF 6.2 (H) 07/01/2022 2005   O2SAT 98.2 07/01/2022 2005    CBG (last 3)  Recent Labs    07/01/22 2349 07/02/22 0403 07/02/22 0743  GLUCAP 212* 185* 184*    Critical care time: 37 minutes  Chesley Mires, MD [place] Pager - [phone] 07/02/2022, 10:01 AM

## 2022-07-02 NOTE — Progress Notes (Signed)
Progress Note   [name] YIR:[id number] DOB: May 16, 1961 DOA: 06/29/2022     3 DOS: the patient was seen and examined on 07/02/2022   Brief hospital course: Oscar Rose is a 61 y.o. male with medical history significant of type 2 diabetes with nephropathy, gastroesophageal reflux disease, hypertension, hyperlipidemia, chronic kidney disease stage IIIb, chronic respiratory failure, sleep apnea, class II obesity and recent hospitalization with multifocal pneumonia; presented from his extended care facility secondary to worsening shortness of breath and hypoxia.  On arrival to the emergency department patient condition further deteriorates and he was found to be obtunded with oxygen saturation in the mid 60s.  EMS personnel transported him providing bagging with bag-valve-mask.  Patient was intubated, mechanically ventilated and sedated.  At time of my evaluation unable to answer any questions or provide any history. Per EDP notes, EMS reports and information from his facility no chest pain, no nausea, no vomiting, no dysuria, no melena, no hematochezia, no focal deficits or any other complaints.   Workup in the ED demonstrating vascular congestion, pulmonary edema with concern for ARDS and multifocal pneumonia.  Patient COVID PCR was negative but he was found to be positive for influenza A.  TRH has been consulted to place patient in the hospital for further evaluation and management.  After discussing with Dr. Halford Rose patient with significant increased oxygen requirement, more difficult to ventilate over the past 24 hours and demonstrating worsening renal function with marginal output while receiving diuresis.  Patient will need to be transferred to [facility] for further critical care management.  Assessment and Plan: * Acute respiratory failure with hypoxia (HCC) -Acute respiratory failure with hypoxia secondary to pulmonary edema/acute on chronic systolic heart failure,  influenza and concerns for multifocal pneumonia. -Still requiring ventilatory support; patient is a spiking fever -Continue treatment with Tamiflu, current antibiotic, bronchodilators, steroids and IV Lasix -Follow clinical results and wean off ventilatory support as tolerated. -Still not ready. -Appreciate assistance and recommendation by PCCM; repeat chest x-ray 8 this morning demonstrated improved variation.. -Stable ABG.  HCAP (healthcare-associated pneumonia) -continue current antibiotics. -Continue to wean patient off ventilatory support as tolerated. -Continue bronchodilators.  CKD (chronic kidney disease) stage 3B -renal function has currently worsened and demonstrating hyperkalemia, Cr 3.66 currently -Continue to closely follow renal function trend -Patient might require renal replacement therapy -Continue Foley catheter -Urinalysis ruling out the presence of UTI currently.   ARDS (adult respiratory distress syndrome) (HCC) -Continue IV diuresis -Continue ARDS protocol and his ventilatory settings -Follow-up PCCM recommendations.  Elevated troponin -Appears to be demand ischemia in presentation due to acute on chronic heart failure. -No ischemic changes appreciated on telemetry -Continue to follow clinical response -Continue treatment with Lipitor, aspirin and Plavix   Influenza A with pneumonia -Continue treatment with Tamiflu -Continue bronchodilator management and the use of a steroid -Follow clinical response.  Acute on chronic systolic CHF (congestive heart failure) (HCC) -Elevated BNP, ARDS changes appreciated on chest images -Continue IV diuresis -ARDS protocol on his ventilatory settings. -Recent echo in October 2023 demonstrating ejection fraction 45 to 50%. -Continue to follow daily weights and strict intake and output.  Type 2 diabetes mellitus (HCC) -With nephropathy; chronic kidney disease a stage IIIb. -Continue sliding scale insulin and the use  of long-acting -Follow CBGs and adjust hypoglycemic regimen as needed -Recent A1c 5.5; Holding on repeating levels.  Hypertension -Overall stable -Continue current antihypertensive agent -Follow-up vital signs.   Subjective:  Afebrile, no nausea or vomiting  reported.  Patient is sedated, intubated and mechanically ventilated; worsening renal function and hyperkalemia on today's labs appreciated.  Patient with worsening acidosis.  Will require longer ventilatory support and anticipating more difficult weaning process.  Physical Exam: Vitals:   07/02/22 1106 07/02/22 1200 07/02/22 1533 07/02/22 1610  BP:  (!) 171/78    Pulse: 74 88  71  Resp: (!) 32 (!) 32  (!) 32  Temp: 98.6 F (37 C) 98.6 F (37 C)  98.4 F (36.9 C)  TempSrc:  Bladder  Bladder  SpO2: 100% 99% 100% 100%  Weight:      Height:       General exam:, Sedated, intubated and mechanically ventilated; still with signs of fluid overload appreciated on exam.  Desaturation and worsening acidosis; requiring higher level of oxygen supplementation. Respiratory system: Decreased breath sounds at the bases, mild expiratory wheezing, positive rhonchi bilaterally. Cardiovascular system:RRR. No rub or gallops; unable to assess JVD with body habitus. Gastrointestinal system: Abdomen is obese, nondistended, soft and nontender. No organomegaly or masses felt. Normal bowel sounds heard. Central nervous system: Unable to assess with current sedation.  Moving 4 limbs spontaneously. Extremities: No cyanosis or clubbing; left transmetatarsal amputation appreciated, wound has healed properly and there is no active drainage.  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  2+ edema appreciated bilaterally. Skin: No petechiae. Psychiatry: Unable to assess with current sedation.  Data Reviewed: Basic metabolic panel: Sodium 163, potassium 6.2, chloride 106, bicarb 20, BUN 86, creatinine 3.66, GFR 18 CBC: WBCs 9.6, hemoglobin 9.3, platelet  count 191 K Ferritin: 757 ABG: pH 7.22, PCO2 52, pO2 84 and bicarbonate 621.6  Family Communication: No family at bedside.  Disposition: Status is: Inpatient Remains inpatient appropriate because: Continue treatment for ARDS, multifocal pneumonia and pulmonary edema due to acute on chronic systolic heart failure.   Planned Discharge Destination: Skilled nursing facility  Time spent: 60 minutes  Author: Barton Dubois, MD 07/02/2022 5:14 PM  For on call review [email or web address].

## 2022-07-02 NOTE — Progress Notes (Addendum)
RN called due to patient's potassium being 6.2 this morning.  EKG was ordered, Lokelma x 1 was ordered.

## 2022-07-02 NOTE — Progress Notes (Signed)
Left toe amputation site dressing removed. Wound appears to be closed and scabbed over. Will leave foot open to air.

## 2022-07-03 ENCOUNTER — Inpatient Hospital Stay (HOSPITAL_COMMUNITY): Payer: Medicare Other

## 2022-07-03 DIAGNOSIS — I5023 Acute on chronic systolic (congestive) heart failure: Secondary | ICD-10-CM

## 2022-07-03 DIAGNOSIS — J189 Pneumonia, unspecified organism: Secondary | ICD-10-CM | POA: Diagnosis not present

## 2022-07-03 DIAGNOSIS — R7889 Finding of other specified substances, not normally found in blood: Secondary | ICD-10-CM

## 2022-07-03 DIAGNOSIS — J9601 Acute respiratory failure with hypoxia: Secondary | ICD-10-CM | POA: Diagnosis not present

## 2022-07-03 LAB — CBC
HCT: 24.8 % — ABNORMAL LOW (ref 39.0–52.0)
Hemoglobin: 8.1 g/dL — ABNORMAL LOW (ref 13.0–17.0)
MCH: 29.5 pg (ref 26.0–34.0)
MCHC: 32.7 g/dL (ref 30.0–36.0)
MCV: 90.2 fL (ref 80.0–100.0)
Platelets: 188 10*3/uL (ref 150–400)
RBC: 2.75 MIL/uL — ABNORMAL LOW (ref 4.22–5.81)
RDW: 14.5 % (ref 11.5–15.5)
WBC: 6 10*3/uL (ref 4.0–10.5)
nRBC: 0 % (ref 0.0–0.2)

## 2022-07-03 LAB — TRIGLYCERIDES: Triglycerides: 213 mg/dL — ABNORMAL HIGH (ref <150)

## 2022-07-03 LAB — ECHOCARDIOGRAM LIMITED
Area-P 1/2: 4.68 cm2
Height: 71 in
S' Lateral: 3.7 cm
Weight: 4384.51 oz

## 2022-07-03 LAB — RENAL FUNCTION PANEL
Albumin: 2.5 g/dL — ABNORMAL LOW (ref 3.5–5.0)
Anion gap: 15 (ref 5–15)
BUN: 96 mg/dL — ABNORMAL HIGH (ref 6–20)
CO2: 18 mmol/L — ABNORMAL LOW (ref 22–32)
Calcium: 8.1 mg/dL — ABNORMAL LOW (ref 8.9–10.3)
Chloride: 106 mmol/L (ref 98–111)
Creatinine, Ser: 3.37 mg/dL — ABNORMAL HIGH (ref 0.61–1.24)
GFR, Estimated: 20 mL/min — ABNORMAL LOW (ref >60)
Glucose, Bld: 232 mg/dL — ABNORMAL HIGH (ref 70–99)
Phosphorus: 3.6 mg/dL (ref 2.5–4.6)
Potassium: 4.1 mmol/L (ref 3.5–5.1)
Sodium: 139 mmol/L (ref 135–145)

## 2022-07-03 LAB — GLUCOSE, CAPILLARY
Glucose-Capillary: 193 mg/dL — ABNORMAL HIGH (ref 70–99)
Glucose-Capillary: 199 mg/dL — ABNORMAL HIGH (ref 70–99)
Glucose-Capillary: 205 mg/dL — ABNORMAL HIGH (ref 70–99)
Glucose-Capillary: 211 mg/dL — ABNORMAL HIGH (ref 70–99)
Glucose-Capillary: 215 mg/dL — ABNORMAL HIGH (ref 70–99)
Glucose-Capillary: 225 mg/dL — ABNORMAL HIGH (ref 70–99)
Glucose-Capillary: 242 mg/dL — ABNORMAL HIGH (ref 70–99)

## 2022-07-03 MED ORDER — LABETALOL HCL 5 MG/ML IV SOLN
10.0000 mg | INTRAVENOUS | Status: DC | PRN
  Administered 2022-07-03 – 2022-07-11 (×14): 10 mg via INTRAVENOUS
  Filled 2022-07-03 (×16): qty 4

## 2022-07-03 MED ORDER — PIPERACILLIN-TAZOBACTAM 3.375 G IVPB
3.3750 g | Freq: Three times a day (TID) | INTRAVENOUS | Status: AC
  Administered 2022-07-03 – 2022-07-06 (×10): 3.375 g via INTRAVENOUS
  Filled 2022-07-03 (×9): qty 50

## 2022-07-03 MED ORDER — BISACODYL 10 MG RE SUPP
10.0000 mg | Freq: Once | RECTAL | Status: AC
  Administered 2022-07-03: 10 mg via RECTAL
  Filled 2022-07-03: qty 1

## 2022-07-03 MED ORDER — AMLODIPINE BESYLATE 5 MG PO TABS
10.0000 mg | ORAL_TABLET | Freq: Every day | ORAL | Status: DC
  Administered 2022-07-03 – 2022-07-09 (×7): 10 mg
  Filled 2022-07-03 (×7): qty 2

## 2022-07-03 MED ORDER — DOCUSATE SODIUM 50 MG/5ML PO LIQD
200.0000 mg | Freq: Two times a day (BID) | ORAL | Status: DC
  Administered 2022-07-03 – 2022-07-06 (×8): 200 mg
  Filled 2022-07-03 (×7): qty 20

## 2022-07-03 MED ORDER — PERFLUTREN LIPID MICROSPHERE
1.0000 mL | INTRAVENOUS | Status: AC | PRN
  Administered 2022-07-03: 4 mL via INTRAVENOUS

## 2022-07-03 MED ORDER — PREDNISONE 20 MG PO TABS
30.0000 mg | ORAL_TABLET | Freq: Every day | ORAL | Status: DC
  Administered 2022-07-04 – 2022-07-05 (×2): 30 mg
  Filled 2022-07-03 (×2): qty 1

## 2022-07-03 MED ORDER — INSULIN ASPART 100 UNIT/ML IJ SOLN
0.0000 [IU] | Freq: Every day | INTRAMUSCULAR | Status: DC
  Administered 2022-07-04: 2 [IU] via SUBCUTANEOUS

## 2022-07-03 MED ORDER — INSULIN ASPART 100 UNIT/ML IJ SOLN
0.0000 [IU] | Freq: Three times a day (TID) | INTRAMUSCULAR | Status: DC
  Administered 2022-07-03 – 2022-07-04 (×5): 5 [IU] via SUBCUTANEOUS

## 2022-07-03 MED ORDER — AMLODIPINE BESYLATE 5 MG PO TABS: 10.0000 mg | ORAL_TABLET | Freq: Every day | ORAL | Status: DC

## 2022-07-03 NOTE — Progress Notes (Signed)
NAME:  JOHNANTHONY WILDEN, MRN:  [id number], DOB:  20-May-1961, LOS: 4 ADMISSION DATE:  06/29/2022, CONSULTATION DATE:  07/01/2022 REFERRING MD:  Dr. Dyann Kief, [facility], CHIEF COMPLAINT:  Respiratory failure   History of Present Illness:  61 yo male resident of [facility] was found unresponsive with SpO2 8%.  In ER he was diaphoretic with paradoxical breathing.  He required intubation.  He was found to have pneumonia from Influenza A and acute pulmonary edema.  He was in hospital from 05/12/22 to 05/18/22 with multifocal pneumonia.    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    Objective   Blood pressure (!) 165/63, pulse 68, temperature 98.8 F (37.1 C), temperature source Axillary, resp. rate (!) 32, height 5\' 11"  (1.803 m), weight 124.3 kg, SpO2 98 %.    Vent Mode: PRVC FiO2 (%):  [40 %-50 %] 40 % Set Rate:  [32 bmp] 32 bmp Vt Set:  [600 mL] 600 mL PEEP:  [5 cmH20] 5 cmH20 Plateau Pressure:  [23 cmH20-26 cmH20] 24 cmH20   Intake/Output Summary (Last 24 hours) at 07/03/2022 1324 Last data filed at 07/03/2022 1204 Gross per 24 hour  Intake 3572.61 ml  Output 6250 ml  Net -2677.39 ml   Filed Weights   07/01/22 0421 07/02/22 0500 07/03/22 0500  Weight: 126.6 kg 125.3 kg 124.3 kg    Examination:  General - sedated Eyes - pupils reactive ENT - ETT in place Cardiac - regular rate/rhythm, no murmur Chest - b/l crackles Abdomen - soft, non tender, + bowel sounds Extremities - 2+ edema Skin - no rashes Neuro - RASS -1,  follows simple commands    Resolved Hospital Problem list     Assessment & Plan:   Acute hypoxic/hypercapnic respiratory failure from Influenza pneumonia with possible bacterial superinfection and acute pulmonary edema. - anticipate difficulty weaning due to deconditioning and recurrent episodes of pulmonary edema - goal SpO2 > 92% - f/u CXR, ABG - can change to prednisone 30 mg daily and wean off as tolerated - prn BDs - day 5 of tamiflu, day 6 of ABx  Acute on chronic HFrEF with baseline EF 45 to 50% from 05/13/22. Elevated troponin from demand ischemia. Hx of HTN, HLD. - continue ASA, lipitor, plavix, lasix - resume norvasc - hold outpt hydralazine, cozaar for now - f/u Echo  DM type 2 poorly controlled with steroid induced hyperglycemia. -  SSI with semglee   Anemia of critical illness and chronic disease. - f/u CBC - transfuse for Hb < 7  AKI. Hyperkalemia. CKD 3b. - f/u BMET - might need renal replacement therapy  [facility] to transfer to [facility] or [facility].  Best Practice (right click and "Reselect all SmartList Selections" daily)   Diet/type: tubefeeds DVT prophylaxis: LMWH GI prophylaxis: PPI Lines: N/A Foley:  N/A Code Status:  full code Last date of multidisciplinary goals of care discussion [x]   Labs       Latest Ref Rng & Units 07/03/2022    8:57 AM 07/02/2022   12:19 PM 07/02/2022    4:24 AM  CMP  Glucose 70 - 99 mg/dL 232  259  205   BUN 6 - 20 mg/dL 96  91  86   Creatinine 0.61 - 1.24 mg/dL 3.37  3.59  3.66   Sodium 135 - 145 mmol/L 139  136  135   Potassium 3.5 - 5.1 mmol/L 4.1  5.2  6.2   Chloride 98 - 111 mmol/L 106  106  106   CO2 22 - 32 mmol/L 18  17  20    Calcium 8.9 - 10.3 mg/dL 8.1  7.9  7.7        Latest Ref Rng & Units 07/03/2022    8:57 AM 07/02/2022    4:24 AM 07/01/2022    6:17 AM  CBC  WBC 4.0 - 10.5 K/uL 6.0  9.6  8.1   Hemoglobin 13.0 - 17.0 g/dL 8.1  8.3  7.6   Hematocrit 39.0 - 52.0 % 24.8  27.0  23.6   Platelets  150 - 400 K/uL 188  191  162     ABG    Component Value Date/Time   PHART 7.22 (L) 07/01/2022 2005   PCO2ART 52 (H) 07/01/2022 2005   PO2ART 84 07/01/2022 2005   HCO3 21.6 07/01/2022 2005   TCO2 27 11/08/2021 0728   ACIDBASEDEF 6.2 (H) 07/01/2022 2005   O2SAT 98.2 07/01/2022 2005    CBG (last 3)  Recent Labs    07/03/22 0427 07/03/22 0741 07/03/22 1138  GLUCAP 199* 215* 242*    Critical care time: 35 minutes  Chesley Mires, MD [place] Pager - [phone] - [phone] 07/03/2022, 1:24 PM

## 2022-07-03 NOTE — Inpatient Diabetes Management (Signed)
Inpatient Diabetes Program Recommendations  AACE/ADA: New Consensus Statement on Inpatient Glycemic Control   Target Ranges:  Prepandial:   less than 140 mg/dL      Peak postprandial:   less than 180 mg/dL (1-2 hours)      Critically ill patients:  140 - 180 mg/dL    Latest Reference Range & Units 07/02/22 07:43 07/02/22 11:03 07/02/22 16:04 07/02/22 20:16 07/03/22 00:20 07/03/22 04:27 07/03/22 07:41  Glucose-Capillary 70 - 99 mg/dL 184 (H) 219 (H) 229 (H) 213 (H) 211 (H) 199 (H) 215 (H)   Review of Glycemic Control  Diabetes history: DM2 Outpatient Diabetes medications: Lantus 40 units QHS Current orders for Inpatient glycemic control: Semglee 10 units QHS, Novolog 0-15 units TID with meals, Novolog 0-5 units QHS; Solumedrol 40 mg daily, Vital @ 55 ml/hr  Inpatient Diabetes Program Recommendations:    Insulin: Please consider ordering Novolog 3 units Q4H for tube feeding coverage. If tube feeding is stopped or held then Novolog tube feeding coverage should also be stopped or held.  Thanks, Barnie Alderman, RN, MSN, [place] [facility] Coordinator Inpatient Diabetes Program [phone] (Team Pager from 8am to [place])

## 2022-07-03 NOTE — Progress Notes (Signed)
  Echocardiogram 2D Echocardiogram has been performed.  Oscar Rose 07/03/2022, 1:46 PM

## 2022-07-03 NOTE — Progress Notes (Addendum)
Patient very agitation within minutes of initiating the wake up assessment. Patient is able to follow commands but RASS was 3 and he was not in synchrony with the vent. Patient was given a suppository for since no BM since admission. No BM yet, however, the was hard stool in the rectal vault when the supp was inserted. Restraints on-going, skin underneath intact. Safety maintained. HOB kept elevated. Fentanyl and propofol maintained. Will cont to monitor and endorse. Urine output so far was greater than 2500 this shift.

## 2022-07-03 NOTE — Progress Notes (Signed)
Progress Note   [name] VFI:[id number] DOB: March 23, 1961 DOA: 06/29/2022     4 DOS: the patient was seen and examined on 07/03/2022   Brief hospital course: Oscar Rose is a 61 y.o. male with medical history significant of type 2 diabetes with nephropathy, gastroesophageal reflux disease, hypertension, hyperlipidemia, chronic kidney disease stage IIIb, chronic respiratory failure, sleep apnea, class II obesity and recent hospitalization with multifocal pneumonia; presented from his extended care facility secondary to worsening shortness of breath and hypoxia.  On arrival to the emergency department patient condition further deteriorates and he was found to be obtunded with oxygen saturation in the mid 60s.  EMS personnel transported him providing bagging with bag-valve-mask.  Patient was intubated, mechanically ventilated and sedated.  At time of my evaluation unable to answer any questions or provide any history. Per EDP notes, EMS reports and information from his facility no chest pain, no nausea, no vomiting, no dysuria, no melena, no hematochezia, no focal deficits or any other complaints.   Workup in the ED demonstrating vascular congestion, pulmonary edema with concern for ARDS and multifocal pneumonia.  Patient COVID PCR was negative but he was found to be positive for influenza A.  TRH has been consulted to place patient in the hospital for further evaluation and management.  07/03/22  -Dr. Halford Rose recommends transfer to [facility] ICU with pulmonary critical care service managing patient/primary team -Patient with significant increased oxygen requirement, more difficult to ventilate over the past couple of days and demonstrating worsening renal function with marginal output while receiving diuresis.   -Patient is currently awaiting ICU bed at [facility] for further critical care management.  Assessment and Plan: 1)Acute respiratory failure with hypoxia ([place]) -Acute  respiratory failure with hypoxia secondary to pulmonary edema/acute on chronic systolic heart failure, influenza  A and multifocal pneumonia. -Remains intubated and ventilated-- Vent Settings:- PRVC/50%/5/32/600 -Continue propofol and fentanyl for sedation -Okay to complete Tamiflu as ordered (5 day course started on 06/29/2022) -Continue cefepime , azithromycin and Solu-Medrol 40 mg daily -Continue gentle diuresis with Lasix 20 mg every 12 hours -Continue bronchodilators -Pulmonary critical care consult from Dr. Halford Rose appreciated --Dr. Halford Rose recommends transfer to [facility] ICU with pulmonary critical care service managing patient/primary team  2)HCAP (healthcare-associated pneumonia) --Management as above #1  3)AKI----acute kidney injury on CKD stage -IV -Creatinine was 2.2 on 11/08/2021, creatinine was 2.8 on 05/15/2022 -Creatinine was 2.55 on admission on 06/29/2022 -Gentle diuresis creatinine is trending up to the mid 3s -renally adjust medications, avoid nephrotoxic agents / dehydration  / hypotension  4) hyperkalemia--- in the setting of AKI on CKD 4 as above #3 -Potassium is down to 4.1 from 6.2 after interventions  5) acute on chronic anemia--Hgb is down to 8.1 from 10.2 on admission -Baseline hemoglobin usually around 9 -No obvious bleeding at this time continue to monitor  6)ARDS (adult respiratory distress syndrome) ([place]) -Continue ARDS protocol and his ventilatory settings - PCCM recommendations by Dr. Halford Rose appreciated -Please see #1 above  7)Elevated Troponin -Appears to be demand ischemia in the setting of hypoxic respiratory failure -No ischemic changes appreciated on EKG and telementry -Echo on 05/13/2022 shows EF is down to 45 to 50 % ,   -EF was 55 to 60% back in July 2017 -Echo from 05/13/2022 still showed wall motion abnormalities, with hypokinesis of the left ventricular, apical anterior inferior wall and lateral as well as inferior lateral and apical  segments -No mitral stenosis or aortic  stenosis -Continue treatment with Lipitor, aspirin and Plavix -Okay to get limited echo  8)Influenza A with pneumonia Complete Tamiflu as above #1 -Further management as above #1  9)Acute on chronic systolic CHF (congestive heart failure) (HCC) -Elevated BNP, ARDS changes appreciated on chest images -Continue IV diuresis -ARDS protocol on his ventilatory settings. -Echo as above #7  10)Type 2 diabetes mellitus ([place]) -With nephropathy; chronic kidney disease a stage IV -A1c 5.5 reflecting excellent diabetic control PTA -Suspect steroid-induced hyperglycemia -Continue Lantus 10 units nightly Use Novolog/Humalog Sliding scale insulin with Accu-Cheks/Fingersticks as ordered   11)Hypertension --IV labetalol as needed elevated BP - 12)FEN--tube feeding via OG tube with  -Prophylaxis Continue Protonix for GI prophylaxis -Continue Lovenox for DVT prophylaxis  CRITICAL CARE Performed by: Oscar Rose   Total critical care time: 57 minutes  Critical care time was exclusive of separately billable procedures and treating other patients.  Critical care was necessary to treat or prevent imminent or life-threatening deterioration. - -Remains intubated and ventilated-- Vent Settings:- PRVC/50%/5/32/600 -Continue propofol and fentanyl for sedation  Critical care was time spent personally by me on the following activities: development of treatment plan with patient and/or surrogate as well as nursing, discussions with consultants, evaluation of patient's response to treatment, examination of patient, obtaining history from patient or surrogate, ordering and performing treatments and interventions, ordering and review of laboratory studies, ordering and review of radiographic studies, pulse oximetry and re-evaluation of patient's condition.   Subjective:  -Remains intubated and sedated -Afebrile -Tolerating tube feeding okay --no  BM  Physical Exam: Vitals:   07/03/22 0600 07/03/22 0700 07/03/22 0802 07/03/22 0900  BP: (!) 167/62 (!) 157/53  (!) 172/55  Pulse: 63 (!) 59  63  Resp: (!) 32 (!) 32  (!) 32  Temp: 98.4 F (36.9 C) 98.4 F (36.9 C)  98.2 F (36.8 C)  TempSrc:      SpO2: 100% 100% 100% 100%  Weight:      Height:       Physical Exam Gen:-Intubated and sedated, HEENT:- [place].AT, No sclera icterus ET tube and OG tube noted Lungs-somewhat less movement, scattered rhonchi bilaterally  CV- S1, S2 normal, RRR Abd-  +ve B.Sounds, Abd Soft, ND Extremity/Skin:- +ve  edema,   good pedal pulses  NeuroPsych-Limited exam due to being intubated and sedated -MSK-prior right big toe amputation, prior left transmetatarsal amputation GU-Foley with clear urine  Family Communication: No family at bedside.  Disposition: transfer to [facility] ICU with pulmonary critical care service managing patient/primary team Status is: Inpatient Remains inpatient appropriate because: Continue treatment for ARDS, multifocal pneumonia and pulmonary edema due to acute on chronic systolic heart failure.   Planned Discharge Destination: Skilled nursing facility... Currently Not stable for discharge   Author: Roxan Hockey, MD 07/03/2022 10:08 AM  For on call review [email or web address].

## 2022-07-04 ENCOUNTER — Inpatient Hospital Stay (HOSPITAL_COMMUNITY): Payer: Medicare Other

## 2022-07-04 DIAGNOSIS — J9601 Acute respiratory failure with hypoxia: Secondary | ICD-10-CM | POA: Diagnosis not present

## 2022-07-04 DIAGNOSIS — J189 Pneumonia, unspecified organism: Secondary | ICD-10-CM | POA: Diagnosis not present

## 2022-07-04 LAB — GLUCOSE, CAPILLARY
Glucose-Capillary: 196 mg/dL — ABNORMAL HIGH (ref 70–99)
Glucose-Capillary: 210 mg/dL — ABNORMAL HIGH (ref 70–99)
Glucose-Capillary: 213 mg/dL — ABNORMAL HIGH (ref 70–99)
Glucose-Capillary: 223 mg/dL — ABNORMAL HIGH (ref 70–99)
Glucose-Capillary: 237 mg/dL — ABNORMAL HIGH (ref 70–99)
Glucose-Capillary: 238 mg/dL — ABNORMAL HIGH (ref 70–99)

## 2022-07-04 LAB — CBC
HCT: 26.5 % — ABNORMAL LOW (ref 39.0–52.0)
Hemoglobin: 8.6 g/dL — ABNORMAL LOW (ref 13.0–17.0)
MCH: 29.7 pg (ref 26.0–34.0)
MCHC: 32.5 g/dL (ref 30.0–36.0)
MCV: 91.4 fL (ref 80.0–100.0)
Platelets: 185 10*3/uL (ref 150–400)
RBC: 2.9 MIL/uL — ABNORMAL LOW (ref 4.22–5.81)
RDW: 14.3 % (ref 11.5–15.5)
WBC: 4.9 10*3/uL (ref 4.0–10.5)
nRBC: 0 % (ref 0.0–0.2)

## 2022-07-04 LAB — COMPREHENSIVE METABOLIC PANEL
ALT: 18 U/L (ref 0–44)
AST: 27 U/L (ref 15–41)
Albumin: 2.6 g/dL — ABNORMAL LOW (ref 3.5–5.0)
Alkaline Phosphatase: 71 U/L (ref 38–126)
Anion gap: 10 (ref 5–15)
BUN: 90 mg/dL — ABNORMAL HIGH (ref 6–20)
CO2: 19 mmol/L — ABNORMAL LOW (ref 22–32)
Calcium: 8.1 mg/dL — ABNORMAL LOW (ref 8.9–10.3)
Chloride: 110 mmol/L (ref 98–111)
Creatinine, Ser: 2.87 mg/dL — ABNORMAL HIGH (ref 0.61–1.24)
GFR, Estimated: 24 mL/min — ABNORMAL LOW (ref >60)
Glucose, Bld: 224 mg/dL — ABNORMAL HIGH (ref 70–99)
Potassium: 4 mmol/L (ref 3.5–5.1)
Sodium: 139 mmol/L (ref 135–145)
Total Bilirubin: 0.6 mg/dL (ref 0.3–1.2)
Total Protein: 6.2 g/dL — ABNORMAL LOW (ref 6.5–8.1)

## 2022-07-04 LAB — BLOOD GAS, ARTERIAL
Acid-Base Excess: 0.4 mmol/L (ref 0.0–2.0)
Bicarbonate: 22 mmol/L (ref 20.0–28.0)
Drawn by: 38235
FIO2: 40 %
O2 Saturation: 97.3 %
Patient temperature: 36.3
pCO2 arterial: 26 mmHg — ABNORMAL LOW (ref 32–48)
pH, Arterial: 7.53 — ABNORMAL HIGH (ref 7.35–7.45)
pO2, Arterial: 73 mmHg — ABNORMAL LOW (ref 83–108)

## 2022-07-04 LAB — CULTURE, BLOOD (ROUTINE X 2): Special Requests: ADEQUATE

## 2022-07-04 NOTE — Progress Notes (Addendum)
Progress Note   [name] [id number] DOB: 1961-03-28 DOA: 06/29/2022     5 DOS: the patient was seen and examined on 07/04/2022   Brief hospital course: Oscar Rose is a 61 y.o. male with medical history significant of type 2 diabetes with nephropathy, gastroesophageal reflux disease, hypertension, hyperlipidemia, chronic kidney disease stage IIIb, chronic respiratory failure, sleep apnea, class II obesity and recent hospitalization with multifocal pneumonia; presented from his extended care facility secondary to worsening shortness of breath and hypoxia.  On arrival to the emergency department patient condition further deteriorates and he was found to be obtunded with oxygen saturation in the mid 60s.  EMS personnel transported him providing bagging with bag-valve-mask.  Patient was intubated, mechanically ventilated and sedated.  At time of my evaluation unable to answer any questions or provide any history. Per EDP notes, EMS reports and information from his facility no chest pain, no nausea, no vomiting, no dysuria, no melena, no hematochezia, no focal deficits or any other complaints.   Workup in the ED demonstrating vascular congestion, pulmonary edema with concern for ARDS and multifocal pneumonia.  Patient COVID PCR was negative but he was found to be positive for influenza A.  TRH has been consulted to place patient in the hospital for further evaluation and management.  07/03/22  -Dr. Halford Chessman recommends transfer to [facility] ICU with pulmonary critical care service managing patient/primary team -Patient with significant increased oxygen requirement, more difficult to ventilate over the past couple of days and demonstrating worsening renal function with marginal output while receiving diuresis.   -Patient is currently awaiting ICU bed at [facility] for further critical care management. --on 07/04/22 Failed sedation vacation,  failed weaning trial  Assessment and  Plan: 1)Acute respiratory failure with hypoxia ([name]) -Acute respiratory failure with hypoxia secondary to pulmonary edema/acute on chronic systolic heart failure, influenza  A and multifocal pneumonia. -Remains intubated and ventilated-- Vent Settings:- PRVC/50%/5/32/600 -Continue propofol and fentanyl for sedation -Okay to complete Tamiflu as ordered (5 day course started on 06/29/2022) -Continue cefepime , azithromycin and Solu-Medrol 40 mg daily -Continue gentle diuresis with Lasix 20 mg every 12 hours -Continue bronchodilators -Pulmonary critical care consult from Dr. Halford Chessman appreciated --Dr. Halford Chessman recommends transfer to [facility] ICU with pulmonary critical care service managing patient/primary team  2)HCAP (healthcare-associated pneumonia) --Management as above #1 Blood cultures from 06/29/2022 with PROPIONIBACTERIUM ACNES  Suspect this is a contaminant--patient completed azithromycin -Was treated with IV cefepime and currently on Zosyn which can be discontinued over the next day or 2  3)AKI----acute kidney injury on CKD stage -IV -Creatinine was 2.2 on 11/08/2021, creatinine was 2.8 on 05/15/2022 -Creatinine was 2.55 on admission on 06/29/2022 -Gentle diuresis creatinine is trending up to the mid 3s -renally adjust medications, avoid nephrotoxic agents / dehydration  / hypotension  4) hyperkalemia--- in the setting of AKI on CKD 4 as above #3 -Potassium is down to 4.1 from 6.2 after interventions  5) acute on chronic anemia--Hgb is down to 8.1 from 10.2 on admission -Baseline hemoglobin usually around 9 -No obvious bleeding at this time continue to monitor  6)ARDS (adult respiratory distress syndrome) (HCC) -Continue ARDS protocol and his ventilatory settings - PCCM recommendations by Dr. Halford Chessman appreciated -Please see #1 above  7)Elevated Troponin -Appears to be demand ischemia in the setting of hypoxic respiratory failure -No ischemic changes appreciated on EKG and  telementry -Echo on 05/13/2022 shows EF is down to 45 to 50 % ,   -  EF was 55 to 60% back in July 2017 -Echo from 05/13/2022 still showed wall motion abnormalities, with hypokinesis of the left ventricular, apical anterior inferior wall and lateral as well as inferior lateral and apical segments -No mitral stenosis or aortic stenosis -Continue treatment with Lipitor, aspirin and Plavix -Okay to get limited echo  8)Influenza A with pneumonia Complete Tamiflu as above #1 -Further management as above #1  9)Acute on chronic systolic CHF (congestive heart failure) (HCC) -Elevated BNP, ARDS changes appreciated on chest images -Continue IV diuresis -ARDS protocol on his ventilatory settings. -Echo as above #7  10)Type 2 diabetes mellitus ([place]) -With nephropathy; chronic kidney disease a stage IV -A1c 5.5 reflecting excellent diabetic control PTA -Suspect steroid-induced hyperglycemia -Continue Lantus 10 units nightly Use Novolog/Humalog Sliding scale insulin with Accu-Cheks/Fingersticks as ordered   11)Hypertension --IV labetalol as needed elevated BP - 12)FEN--tube feeding via OG tube with  -Prophylaxis Continue Protonix for GI prophylaxis -Continue Lovenox for DVT prophylaxis  CRITICAL CARE Performed by: Roxan Hockey   Total critical care time: 52 minutes  Critical care time was exclusive of separately billable procedures and treating other patients.  Critical care was necessary to treat or prevent imminent or life-threatening deterioration. - -Remains intubated and ventilated-- Vent Settings:- PRVC/40%/5/26/600 -Continue propofol and fentanyl for sedation --on 07/04/22 Failed sedation vacation,  failed weaning trial  Critical care was time spent personally by me on the following activities: development of treatment plan with patient and/or surrogate as well as nursing, discussions with consultants, evaluation of patient's response to treatment, examination of patient,  obtaining history from patient or surrogate, ordering and performing treatments and interventions, ordering and review of laboratory studies, ordering and review of radiographic studies, pulse oximetry and re-evaluation of patient's condition.   Subjective:  -Remains intubated and sedated -Afebrile -Tolerating tube feeding okay Had BM -on 07/04/22 Failed sedation vacation,  failed weaning trial  Physical Exam: Vitals:   07/04/22 1500 07/04/22 1507 07/04/22 1539 07/04/22 1600  BP: (!) 151/50   (!) 142/47  Pulse: (!) 56   (!) 59  Resp: (!) 26   (!) 26  Temp: 98.2 F (36.8 C)   98.2 F (36.8 C)  TempSrc:      SpO2: 99% 97% 99% 98%  Weight:      Height:       Physical Exam Gen:-Intubated and sedated, HEENT:- [place].AT, No sclera icterus ET tube and OG tube noted Lungs-somewhat less movement, scattered rhonchi bilaterally  CV- S1, S2 normal, RRR Abd-  +ve B.Sounds, Abd Soft, ND Extremity/Skin:- +ve  edema,   good pedal pulses  NeuroPsych-Limited exam due to being intubated and sedated -MSK-prior right big toe amputation, prior left transmetatarsal amputation GU-Foley with clear urine  Family Communication: No family at bedside.  Disposition: transfer to [facility] ICU with pulmonary critical care service managing patient/primary team Status is: Inpatient Remains inpatient appropriate because: Continue treatment for ARDS, multifocal pneumonia and pulmonary edema due to acute on chronic systolic heart failure.   Planned Discharge Destination: Skilled nursing facility... Currently Not stable for discharge   Author: Roxan Hockey, MD 07/04/2022 5:53 PM  For on call review [email or web address].

## 2022-07-04 NOTE — Progress Notes (Signed)
NAME:  Oscar Rose, MRN:  [id number], DOB:  25-Dec-1960, LOS: 5 ADMISSION DATE:  06/29/2022, CONSULTATION DATE:  07/01/2022 REFERRING MD:  Dr. Dyann Kief, [facility], CHIEF COMPLAINT:  Respiratory failure   History of Present Illness:  61 yo male resident of [place] was found unresponsive with SpO2 8%.  In ER he was diaphoretic with paradoxical breathing.  He required intubation.  He was found to have pneumonia from Influenza A and acute pulmonary edema.  He was in hospital from 05/12/22 to 05/18/22 with multifocal pneumonia.    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  Improved urine outpt.  Tolerated pressure support 12/5 this afternoon.  Objective   Blood pressure (!) 161/57, pulse 60, temperature 98.2 F (36.8 C), resp. rate (!) 26, height 5\' 11"  (1.803 m), weight 120.8 kg, SpO2 98 %.    Vent Mode: PRVC FiO2 (%):  [40 %] 40 % Set Rate:  [26 bmp-32 bmp] 26 bmp Vt Set:  [600 mL] 600 mL PEEP:  [5 cmH20] 5 cmH20 Plateau Pressure:  [21 cmH20-25 cmH20] 21 cmH20   Intake/Output Summary (Last 24 hours) at 07/04/2022 1337 Last data filed at 07/04/2022 1230 Gross per 24 hour  Intake 3193.38 ml  Output 5900 ml  Net -2706.62 ml   Filed Weights   07/02/22 0500 07/03/22 0500 07/04/22 0558  Weight: 125.3 kg 124.3 kg 120.8 kg    Examination:  General - sedated Eyes - pupils reactive ENT - ETT in place Cardiac - regular rate/rhythm, no murmur Chest - better air  movement, faint crackles b/l Abdomen - soft, non tender, + bowel sounds Extremities - 2+ edema Skin - no rashes Neuro - RASS -2   Resolved Hospital Problem list     Assessment & Plan:   Acute hypoxic/hypercapnic respiratory failure from Influenza pneumonia with possible bacterial superinfection and acute pulmonary edema. - might be ready to cycle on pressure support - goal SpO2 > 92% - f/u CXR intermittently - wean off prednisone as tolerated - prn BDs - day 7 of Abx  Acute on chronic HFrEF >> improved EF on Echo from 07/03/22. Elevated troponin from demand ischemia. Hx of HTN, HLD. - continue ASA, lipitor, plavix, lasix, norvasc - hold outpt hydralazine, cozaar for now  DM type 2 poorly controlled with steroid induced hyperglycemia. -  SSI with semglee   Anemia of critical illness and chronic disease. - f/u CBC - transfuse for Hb < 7  AKI. Hyperkalemia. CKD 3b. - f/u BMET  Awaiting transfer to [place].  Making some progress.  Might be able to continue management at [facility] at this point.  Best Practice (right click and "Reselect all SmartList Selections" daily)   Diet/type: tubefeeds DVT prophylaxis: LMWH GI prophylaxis: PPI Lines: N/A Foley:  N/A Code Status:  full code Last date of multidisciplinary goals of care discussion [x]   Labs       Latest Ref Rng & Units 07/04/2022    5:23 AM 07/03/2022    8:57 AM 07/02/2022   12:19 PM  CMP  Glucose 70 - 99 mg/dL 224  232  259   BUN 6 - 20 mg/dL 90  96  91   Creatinine 0.61 - 1.24 mg/dL 2.87  3.37  3.59   Sodium 135 - 145 mmol/L 139  139  136   Potassium 3.5 - 5.1 mmol/L 4.0  4.1  5.2   Chloride 98 - 111 mmol/L 110  106  106   CO2 22 - 32 mmol/L 19  18  17    Calcium 8.9 - 10.3 mg/dL 8.1  8.1  7.9   Total Protein 6.5 - 8.1 g/dL 6.2     Total Bilirubin 0.3 - 1.2 mg/dL 0.6     Alkaline Phos 38 - 126 U/L 71     AST 15 - 41 U/L 27     ALT 0 - 44 U/L 18          Latest Ref Rng & Units 07/04/2022     5:23 AM 07/03/2022    8:57 AM 07/02/2022    4:24 AM  CBC  WBC 4.0 - 10.5 K/uL 4.9  6.0  9.6   Hemoglobin 13.0 - 17.0 g/dL 8.6  8.1  8.3   Hematocrit 39.0 - 52.0 % 26.5  24.8  27.0   Platelets 150 - 400 K/uL 185  188  191     ABG    Component Value Date/Time   PHART 7.53 (H) 07/04/2022 0448   PCO2ART 26 (L) 07/04/2022 0448   PO2ART 73 (L) 07/04/2022 0448   HCO3 22.0 07/04/2022 0448   TCO2 27 11/08/2021 0728   ACIDBASEDEF 6.2 (H) 07/01/2022 2005   O2SAT 97.3 07/04/2022 0448    CBG (last 3)  Recent Labs    07/04/22 0410 07/04/22 0755 07/04/22 1131  GLUCAP 196* 210* 238*    Critical care time: 32 minutes  Chesley Mires, MD [place] Pager - [phone] - [phone] 07/04/2022, 1:37 PM

## 2022-07-04 NOTE — Inpatient Diabetes Management (Signed)
Inpatient Diabetes Program Recommendations  AACE/ADA: New Consensus Statement on Inpatient Glycemic Control   Target Ranges:  Prepandial:   less than 140 mg/dL      Peak postprandial:   less than 180 mg/dL (1-2 hours)      Critically ill patients:  140 - 180 mg/dL    Latest Reference Range & Units 07/04/22 00:06 07/04/22 04:10 07/04/22 07:55  Glucose-Capillary 70 - 99 mg/dL 213 (H) 196 (H) 210 (H)  Novolog 5 units    Latest Reference Range & Units 07/03/22 07:41 07/03/22 11:38 07/03/22 16:02 07/03/22 20:02 07/03/22 22:28  Glucose-Capillary 70 - 99 mg/dL 215 (H)  Novolog 3 units 242 (H)  Novolog 5 units 205 (H) 225 (H) 193 (H)    Semglee 10 units   Review of Glycemic Control  Diabetes history: DM2 Outpatient Diabetes medications: Lantus 40 units QHS Current orders for Inpatient glycemic control: Semglee 10 units QHS, Novolog 0-15 units TID with meals, Novolog 0-5 units QHS; Prednisone 30 mg QAM, Vital @ 55 ml/hr   Inpatient Diabetes Program Recommendations:     Insulin: Noted steroids changed from Solumedrol to Prednisone.  Please change Novolog correction frequency to 0-15 units Q4H.   Thanks, Barnie Alderman, RN, MSN, [place] Diabetes Coordinator Inpatient Diabetes Program [phone] (Team Pager from 8am to [place])

## 2022-07-05 DIAGNOSIS — J189 Pneumonia, unspecified organism: Secondary | ICD-10-CM | POA: Diagnosis not present

## 2022-07-05 DIAGNOSIS — J9601 Acute respiratory failure with hypoxia: Secondary | ICD-10-CM | POA: Diagnosis not present

## 2022-07-05 LAB — RENAL FUNCTION PANEL
Albumin: 2.5 g/dL — ABNORMAL LOW (ref 3.5–5.0)
Anion gap: 12 (ref 5–15)
BUN: 87 mg/dL — ABNORMAL HIGH (ref 6–20)
CO2: 20 mmol/L — ABNORMAL LOW (ref 22–32)
Calcium: 8.2 mg/dL — ABNORMAL LOW (ref 8.9–10.3)
Chloride: 110 mmol/L (ref 98–111)
Creatinine, Ser: 2.88 mg/dL — ABNORMAL HIGH (ref 0.61–1.24)
GFR, Estimated: 24 mL/min — ABNORMAL LOW (ref >60)
Glucose, Bld: 251 mg/dL — ABNORMAL HIGH (ref 70–99)
Phosphorus: 3.6 mg/dL (ref 2.5–4.6)
Potassium: 4.4 mmol/L (ref 3.5–5.1)
Sodium: 142 mmol/L (ref 135–145)

## 2022-07-05 LAB — GLUCOSE, CAPILLARY
Glucose-Capillary: 199 mg/dL — ABNORMAL HIGH (ref 70–99)
Glucose-Capillary: 218 mg/dL — ABNORMAL HIGH (ref 70–99)
Glucose-Capillary: 221 mg/dL — ABNORMAL HIGH (ref 70–99)
Glucose-Capillary: 222 mg/dL — ABNORMAL HIGH (ref 70–99)
Glucose-Capillary: 232 mg/dL — ABNORMAL HIGH (ref 70–99)
Glucose-Capillary: 234 mg/dL — ABNORMAL HIGH (ref 70–99)
Glucose-Capillary: 247 mg/dL — ABNORMAL HIGH (ref 70–99)

## 2022-07-05 LAB — CULTURE, BLOOD (ROUTINE X 2): Special Requests: ADEQUATE

## 2022-07-05 MED ORDER — INSULIN ASPART 100 UNIT/ML IJ SOLN: 3.0000 [IU] | Freq: Four times a day (QID) | INTRAMUSCULAR | Status: DC

## 2022-07-05 MED ORDER — ENOXAPARIN SODIUM 60 MG/0.6ML IJ SOSY
60.0000 mg | PREFILLED_SYRINGE | INTRAMUSCULAR | Status: DC
  Administered 2022-07-05 – 2022-07-12 (×8): 60 mg via SUBCUTANEOUS
  Filled 2022-07-05 (×8): qty 0.6

## 2022-07-05 MED ORDER — PREDNISONE 20 MG PO TABS
20.0000 mg | ORAL_TABLET | Freq: Every day | ORAL | Status: DC
  Administered 2022-07-06 – 2022-07-09 (×4): 20 mg
  Filled 2022-07-05 (×4): qty 1

## 2022-07-05 MED ORDER — INSULIN ASPART 100 UNIT/ML IJ SOLN
0.0000 [IU] | INTRAMUSCULAR | Status: DC
  Administered 2022-07-05 (×3): 5 [IU] via SUBCUTANEOUS
  Administered 2022-07-05: 3 [IU] via SUBCUTANEOUS
  Administered 2022-07-05 (×2): 5 [IU] via SUBCUTANEOUS
  Administered 2022-07-06: 8 [IU] via SUBCUTANEOUS
  Administered 2022-07-06: 3 [IU] via SUBCUTANEOUS
  Administered 2022-07-06: 5 [IU] via SUBCUTANEOUS
  Administered 2022-07-06: 3 [IU] via SUBCUTANEOUS
  Administered 2022-07-06: 8 [IU] via SUBCUTANEOUS
  Administered 2022-07-07: 5 [IU] via SUBCUTANEOUS
  Administered 2022-07-07: 8 [IU] via SUBCUTANEOUS
  Administered 2022-07-07 (×2): 5 [IU] via SUBCUTANEOUS
  Administered 2022-07-07: 8 [IU] via SUBCUTANEOUS
  Administered 2022-07-07: 11 [IU] via SUBCUTANEOUS
  Administered 2022-07-07: 3 [IU] via SUBCUTANEOUS
  Administered 2022-07-08: 8 [IU] via SUBCUTANEOUS
  Administered 2022-07-08 (×2): 5 [IU] via SUBCUTANEOUS
  Administered 2022-07-08: 8 [IU] via SUBCUTANEOUS
  Administered 2022-07-08: 3 [IU] via SUBCUTANEOUS
  Administered 2022-07-09: 8 [IU] via SUBCUTANEOUS
  Administered 2022-07-09: 5 [IU] via SUBCUTANEOUS
  Administered 2022-07-09: 8 [IU] via SUBCUTANEOUS
  Administered 2022-07-09: 5 [IU] via SUBCUTANEOUS
  Administered 2022-07-09 – 2022-07-10 (×5): 3 [IU] via SUBCUTANEOUS
  Administered 2022-07-10: 2 [IU] via SUBCUTANEOUS
  Administered 2022-07-10 (×2): 3 [IU] via SUBCUTANEOUS
  Administered 2022-07-11 (×2): 5 [IU] via SUBCUTANEOUS
  Administered 2022-07-11 (×2): 8 [IU] via SUBCUTANEOUS
  Administered 2022-07-11 (×2): 3 [IU] via SUBCUTANEOUS
  Administered 2022-07-12: 11 [IU] via SUBCUTANEOUS
  Administered 2022-07-12: 5 [IU] via SUBCUTANEOUS
  Administered 2022-07-12: 8 [IU] via SUBCUTANEOUS
  Administered 2022-07-12 – 2022-07-13 (×4): 3 [IU] via SUBCUTANEOUS
  Administered 2022-07-13: 5 [IU] via SUBCUTANEOUS
  Administered 2022-07-13 (×3): 3 [IU] via SUBCUTANEOUS
  Administered 2022-07-14: 2 [IU] via SUBCUTANEOUS
  Administered 2022-07-14 – 2022-07-15 (×2): 3 [IU] via SUBCUTANEOUS
  Administered 2022-07-15: 2 [IU] via SUBCUTANEOUS

## 2022-07-05 MED ORDER — INSULIN ASPART 100 UNIT/ML IJ SOLN
3.0000 [IU] | INTRAMUSCULAR | Status: DC
  Administered 2022-07-05 – 2022-07-08 (×17): 3 [IU] via SUBCUTANEOUS

## 2022-07-05 MED ORDER — HYDRALAZINE HCL 25 MG PO TABS: 50.0000 mg | ORAL_TABLET | Freq: Three times a day (TID) | ORAL | Status: DC

## 2022-07-05 MED ORDER — HYDRALAZINE HCL 25 MG PO TABS
50.0000 mg | ORAL_TABLET | Freq: Three times a day (TID) | ORAL | Status: DC
  Administered 2022-07-05 – 2022-07-09 (×13): 50 mg
  Filled 2022-07-05 (×12): qty 2

## 2022-07-05 NOTE — Progress Notes (Signed)
NAME:  Oscar Rose, MRN:  [id number], DOB:  27-Dec-1960, LOS: 6 ADMISSION DATE:  06/29/2022, CONSULTATION DATE:  07/01/2022 REFERRING MD:  Dr. Dyann Kief, [place], CHIEF COMPLAINT:  Respiratory failure   History of Present Illness:  61 yo male resident of [facility] was found unresponsive with SpO2 8%.  In ER he was diaphoretic with paradoxical breathing.  He required intubation.  He was found to have pneumonia from Influenza A and acute pulmonary edema.  He was in hospital from 05/12/22 to 05/18/22 with multifocal pneumonia.    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 11/29 completed tamiflu 11/30 start pressure support weaning  Interim History / Subjective:  Did better with pressure support today.  RR decreased to 12.  Tolerating reduction in sedation better.  Objective   Blood pressure (!) 160/58, pulse 78, temperature 99.5 F (37.5 C), temperature source Axillary, resp. rate 11, height 5\' 11"  (1.803 m), weight 118.1 kg, SpO2 97 %.    Vent Mode: CPAP FiO2 (%):  [40 %] 40 % Set Rate:  [12 bmp-26 bmp] 12 bmp Vt Set:  [600 mL] 600 mL PEEP:  [5 cmH20] 5 cmH20 Pressure Support:  [10 NLZ76-73 cmH20] 10 cmH20 Plateau Pressure:  [7 cmH20-24 cmH20] 7 cmH20   Intake/Output Summary (Last 24 hours) at 07/05/2022 1503 Last data filed at 07/05/2022 1404 Gross per 24 hour  Intake 3640.63 ml  Output 1225 ml  Net 2415.63 ml   Filed Weights   07/03/22 0500 07/04/22 0558 07/05/22 0500  Weight: 124.3 kg 120.8 kg 118.1 kg    Examination:  General - sedated Eyes - pupils reactive ENT - ETT  in place Cardiac - regular rate/rhythm, no murmur Chest - scattered rhonchi Abdomen - soft, non tender, + bowel sounds Extremities - 1+ edema Skin - no rashes Neuro - RASS -1, follows simple commands  Resolved Hospital Problem list     Assessment & Plan:   Acute hypoxic/hypercapnic respiratory failure from Influenza pneumonia with possible bacterial superinfection and acute pulmonary edema. - continue pressure support as tolerated - might be ready for extubation trial in next 48 to 72 hours if he continues to improve on current trajectory - f/u CXR intermittently - goal SpO2 > 92% - change prednisone to 20 mg daily and wean off as tolerated - day 8 of ABx - prn BDs  Acute on chronic HFrEF >> improved EF on Echo from 07/03/22. Elevated troponin from demand ischemia. Hx of HTN, HLD. DM type 2 poorly controlled with steroid induced hyperglycemia. Anemia of critical illness and chronic disease. AKI from overdiuresis; has tolerated lower dose of diuretics. Hyperkalemia. CKD 3b. - per primary team  Best Practice (right click and "Reselect all SmartList Selections" daily)   Diet/type: tubefeeds DVT prophylaxis: LMWH GI prophylaxis: PPI Lines: N/A Foley:  N/A Code Status:  full code Last date of multidisciplinary goals of care discussion [x]   Labs       Latest Ref Rng & Units 07/05/2022    3:09 AM 07/04/2022    5:23 AM 07/03/2022    8:57 AM  CMP  Glucose 70 - 99 mg/dL 251  224  232   BUN 6 - 20 mg/dL 87  90  96   Creatinine 0.61 - 1.24 mg/dL 2.88  2.87  3.37   Sodium 135 - 145 mmol/L 142  139  139   Potassium 3.5 - 5.1 mmol/L 4.4  4.0  4.1   Chloride 98 - 111 mmol/L 110  110  106   CO2 22 - 32 mmol/L 20  19  18    Calcium 8.9 - 10.3 mg/dL 8.2  8.1  8.1   Total Protein 6.5 - 8.1 g/dL  6.2    Total Bilirubin 0.3 - 1.2 mg/dL  0.6    Alkaline Phos 38 - 126 U/L  71    AST 15 - 41 U/L  27    ALT 0 - 44 U/L  18         Latest Ref Rng & Units 07/04/2022    5:23 AM  07/03/2022    8:57 AM 07/02/2022    4:24 AM  CBC  WBC 4.0 - 10.5 K/uL 4.9  6.0  9.6   Hemoglobin 13.0 - 17.0 g/dL 8.6  8.1  8.3   Hematocrit 39.0 - 52.0 % 26.5  24.8  27.0   Platelets 150 - 400 K/uL 185  188  191     ABG    Component Value Date/Time   PHART 7.53 (H) 07/04/2022 0448   PCO2ART 26 (L) 07/04/2022 0448   PO2ART 73 (L) 07/04/2022 0448   HCO3 22.0 07/04/2022 0448   TCO2 27 11/08/2021 0728   ACIDBASEDEF 6.2 (H) 07/01/2022 2005   O2SAT 97.3 07/04/2022 0448    CBG (last 3)  Recent Labs    07/05/22 0339 07/05/22 0656 07/05/22 1122  GLUCAP 199* 218* 247*    Signature:  Chesley Mires, MD [place] Pager - [phone] 07/05/2022, 3:03 PM

## 2022-07-05 NOTE — Progress Notes (Signed)
Patient weaned on 10/5 with 40% from 1322 to 1455. At 1455 desaturation to 86%,1500 BP 221/82 patient was returned to rest mode of PRVC 26/ 600/ 80%/ PEEP 5. He was unable to maintain an adequate SpO2 on 40% at that time. I will continue to attempt FiO2 titration.

## 2022-07-05 NOTE — Progress Notes (Signed)
eLink Physician-Brief Progress Note Patient Name: KAEMON BARNETT DOB: Feb 15, 1961 MRN: [id number]   Date of Service  07/05/2022  HPI/Events of Note  Hyperglycemia - Blood glucose consistently in the 200's and patient is on steroids. DM Cooridinator recommended changing Novolog correction frequency to 0-15 units Q4H.  eICU Interventions  Will change to Q 4 hour moderate Novolog SSI and Q 4 hour POC CBGs.     Intervention Category Major Interventions: Hyperglycemia - active titration of insulin therapy  Skyann Ganim [place] 07/05/2022, 1:07 AM

## 2022-07-05 NOTE — Progress Notes (Signed)
Progress Note   [name] TSV:[id number] DOB: [birth year] DOA: 06/29/2022     6 DOS: the patient was seen and examined on 07/05/2022   Brief hospital course: TREVEON BOURCIER is a 61 y.o. male with medical history significant of type 2 diabetes with nephropathy, gastroesophageal reflux disease, hypertension, hyperlipidemia, chronic kidney disease stage IIIb, chronic respiratory failure, sleep apnea, class II obesity and recent hospitalization with multifocal pneumonia; presented from his extended care facility secondary to worsening shortness of breath and hypoxia.  On arrival to the emergency department patient condition further deteriorates and he was found to be obtunded with oxygen saturation in the mid 60s.  EMS personnel transported him providing bagging with bag-valve-mask.  Patient was intubated, mechanically ventilated and sedated.  At time of my evaluation unable to answer any questions or provide any history. Per EDP notes, EMS reports and information from his facility no chest pain, no nausea, no vomiting, no dysuria, no melena, no hematochezia, no focal deficits or any other complaints.   Workup in the ED demonstrating vascular congestion, pulmonary edema with concern for ARDS and multifocal pneumonia.  Patient COVID PCR was negative but he was found to be positive for influenza A.  TRH has been consulted to place patient in the hospital for further evaluation and management.  07/03/22  -Dr. Halford Chessman recommends transfer to [facility] ICU with pulmonary critical care service managing patient/primary team -Patient with significant increased oxygen requirement, more difficult to ventilate over the past couple of days and demonstrating worsening renal function with marginal output while receiving diuresis.   -Patient is currently awaiting ICU bed at [facility] for further critical care management. --on 07/04/22 Failed sedation vacation,  failed weaning trial  Assessment and  Plan: 1)Acute respiratory failure with hypoxia ([place]) -Acute respiratory failure with hypoxia secondary to pulmonary edema/acute on chronic systolic heart failure, influenza  A and multifocal pneumonia. -Remains intubated and ventilated-- Vent Settings:- PRVC/40%/5/26/600 -Continue propofol and fentanyl for sedation -Okay to completed Tamiflu as ordered (5 day course started on 06/29/2022) Treated with cefepime and azithromycin--he completed azithromycin - currently on Zosyn for anaerobic bacteremia--which may be contaminant, we will DC Zosyn on 07/05/2022 -Treated with Solu-Medrol, okay to continue prednisone -Continue gentle diuresis with Lasix 20 mg every 12 hours -Continue bronchodilators -Pulmonary critical care consult from Dr. Halford Chessman appreciated --Dr. Halford Chessman recommends transfer to [facility] ICU with pulmonary critical care service managing patient/primary team  2)HCAP (healthcare-associated pneumonia) --Management as above #1 Blood cultures from 06/29/2022 with PROPIONIBACTERIUM ACNES  Suspect this is a contaminant--patient completed azithromycin -Was treated with IV cefepime and currently on Zosyn which can be discontinued over the next day or 2  3)AKI----acute kidney injury on CKD stage -IV -Creatinine was 2.2 on 11/08/2021, creatinine was 2.8 on 05/15/2022 -Creatinine was 2.55 on admission on 06/29/2022 -Creatinine continues to improve after peaking at 3.66 -renally adjust medications, avoid nephrotoxic agents / dehydration  / hypotension  4) hyperkalemia--- in the setting of AKI on CKD 4 as above #3 -Resolved with interventions  5) acute on chronic anemia--Hgb is down to 8.1 from 10.2 on admission -Baseline hemoglobin usually around 9 -No obvious bleeding at this time continue to monitor  6)ARDS (adult respiratory distress syndrome) (HCC) -Continue ARDS protocol and his ventilatory settings - PCCM recommendations by Dr. Halford Chessman appreciated -Please see #1 above  7)Elevated  Troponin -Appears to be demand ischemia in the setting of hypoxic respiratory failure -No ischemic changes appreciated on EKG and telementry -Echo  from 05/13/2022 with EF of 55 to 60%, still showed wall motion abnormalities, with hypokinesis of the left ventricular, apical anterior inferior wall and lateral as well as inferior lateral and apical segments -No mitral stenosis or aortic stenosis -Continue treatment with Lipitor, aspirin and Plavix  8)Influenza A with pneumonia Completed Tamiflu as above #1 -Further management as above #1  9)Acute on chronic systolic CHF (congestive heart failure) (HCC) -Elevated BNP --Continue IV diuresis -ARDS protocol on his ventilatory settings. -Echo as above #7  10)Type 2 diabetes mellitus ([place]) -With nephropathy; chronic kidney disease a stage IV -A1c 5.5 reflecting excellent diabetic control PTA -Suspect steroid-induced hyperglycemia -Continue Lantus 10 units nightly Use Novolog/Humalog Sliding scale insulin with Accu-Cheks/Fingersticks as ordered   11)Hypertension -BP is not at goal, continue amlodipine, hydralazine,  -may use IV labetalol as needed elevated BP - 12)FEN--tube feeding via OG tube with H20 flushes - 13)Social/Ethics---no living relatives here in the [place] (his blood relatives live [place]),  Ms April Brame-- [phone]---is his Primary contact (she is the sister of Mr. Tompson's ex Celesta Gentile who died in October 30, 2015) -  -Prophylaxis Continue Protonix for GI prophylaxis -Continue Lovenox for DVT prophylaxis  CRITICAL CARE Performed by: Roxan Hockey  Total critical care time: 56 minutes  Critical care time was exclusive of separately billable procedures and treating other patients.  Critical care was necessary to treat or prevent imminent or life-threatening deterioration. - -Remains intubated and ventilated-- Vent Settings:- PRVC/40%/5/26/600 -Continue propofol and fentanyl for sedation --on 07/04/22 Failed sedation  vacation,  failed weaning trial  Critical care was time spent personally by me on the following activities: development of treatment plan with patient and/or surrogate as well as nursing, discussions with consultants, evaluation of patient's response to treatment, examination of patient, obtaining history from patient or surrogate, ordering and performing treatments and interventions, ordering and review of laboratory studies, ordering and review of radiographic studies, pulse oximetry and re-evaluation of patient's condition.   Subjective:  Updated Ms April Brame-- [phone]---is his Primary contact (she is the sister of Mr. Jain's ex Celesta Gentile who died in 2015/10/30) - Continues to struggle with weaning trials and sedation vacation -Had multiple BMs -  Physical Exam: Vitals:   07/05/22 0800 07/05/22 0828 07/05/22 0900 07/05/22 1000  BP: (!) 173/55  (!) 172/56 (!) 177/60  Pulse: 69  65 71  Resp: (!) 26  (!) 26 (!) 26  Temp:      TempSrc:      SpO2: 95% 95% 95% 95%  Weight:      Height:       Physical Exam Gen:-Intubated and sedated, HEENT:- [place].AT, No sclera icterus ET tube and OG tube noted Lungs-, slightly diminished air movement , no wheezing   CV- S1, S2 normal, RRR Abd-  +ve B.Sounds, Abd Soft, ND Extremity/Skin:- +ve  edema,   good pedal pulses  NeuroPsych-Limited exam due to being intubated and sedated -MSK-prior right big toe amputation, prior left transmetatarsal amputation GU-Foley with clear urine  Family Communication: No family at bedside.  Disposition: transfer to [facility] ICU with pulmonary critical care service managing patient/primary team Status is: Inpatient Remains inpatient appropriate because: Continue treatment for ARDS, multifocal pneumonia and pulmonary edema due to acute on chronic systolic heart failure.   Planned Discharge Destination: Skilled nursing facility... Currently Not stable for discharge   Author: Roxan Hockey, MD 07/05/2022 10:26  AM  For on call review [email or web address].

## 2022-07-05 NOTE — Progress Notes (Signed)
Nutrition Follow up  DOCUMENTATION CODES:   Not applicable  INTERVENTION:  -Vital 1.5 @ 55 (1320 ml/day)  -ProSource TF20 (60 ml) BID   Tube feeding provides 2140 kcal, 129 grams of protein, and 1008 ml of H2O.   NUTRITION DIAGNOSIS:   Inadequate oral intake related to inability to eat as evidenced by NPO status.  -addressed with alternate nutrition support   GOAL:   Patient will meet greater than or equal to 90% of their needs  - Met with enteral feeding  MONITOR:   Vent status, Labs, Weight trends, TF tolerance, I & O's  REASON FOR ASSESSMENT:   Ventilator, Consult Enteral/tube feeding initiation and management  ASSESSMENT:   61 year old male who presented to the ED on 11/25 after being found unresponsive at facility. PMH of T2DM with nephropathy, GERD, HTN, HLD, CKD stage IIIb, chronic respiratory failure, sleep apnea. Noted pt with recent hospitalization for multifocal PNA. Pt required intubation. Pt admitted with acute respiratory failure secondary to pulmonary edema/acute on chronic systolic HF, influenza.  Patient remains intubated, sedated on ventilator support. Patient awaiting transfer to [facility]. MV: 16 L/min MAP-93 Temp (24hrs), Avg:98.8 F (37.1 C), Min:98.2 F (36.8 C), Max:99.8 F (37.7 C)  Propofol: 32 ml/hr (845 kcal)  Intake/Output Summary (Last 24 hours) at 07/05/2022 1548 Last data filed at 07/05/2022 1404 Gross per 24 hour  Intake 2967.22 ml  Output 750 ml  Net 2217.22 ml   Since admission- +2.2 liters  Medications: colace BID, lasix 20 mg BID, insulin, prednisone and miralax  CBG (last 3)  Recent Labs    07/05/22 0339 07/05/22 0656 07/05/22 1122  GLUCAP 199* 218* 247*        Latest Ref Rng & Units 07/05/2022    3:09 AM 07/04/2022    5:23 AM 07/03/2022    8:57 AM  BMP  Glucose 70 - 99 mg/dL 251  224  232   BUN 6 - 20 mg/dL 87  90  96   Creatinine 0.61 - 1.24 mg/dL 2.88  2.87  3.37   Sodium 135 - 145 mmol/L 142  139  139    Potassium 3.5 - 5.1 mmol/L 4.4  4.0  4.1   Chloride 98 - 111 mmol/L 110  110  106   CO2 22 - 32 mmol/L _0 Calcium 8.9 - 10.3 mg/dL 8.2  8.1  8.1      Diet Order:   Diet Order     None       EDUCATION NEEDS:   No education needs have been identified at this time  Skin:  Skin Assessment: Reviewed RN Assessment  Last BM:  11/30  Height:   Ht Readings from Last 1 Encounters:  07/01/22 _1  (1.803 m)    Weight:   Wt Readings from Last 1 Encounters:  07/05/22 118.1 kg    Ideal Body Weight:  78.2 kg  BMI:  Body mass index is 36.31 kg/m.  Estimated Nutritional Needs:   Kcal:  2100-2300  Protein:  110-130 grams  Fluid:  >2.0 L  Colman Cater MS,RD,CSG,LDN Contact: AMION

## 2022-07-05 NOTE — Inpatient Diabetes Management (Signed)
Inpatient Diabetes Program Recommendations  AACE/ADA: New Consensus Statement on Inpatient Glycemic Control (2015)  Target Ranges:  Prepandial:   less than 140 mg/dL      Peak postprandial:   less than 180 mg/dL (1-2 hours)      Critically ill patients:  140 - 180 mg/dL    Latest Reference Range & Units 07/05/22 00:45 07/05/22 03:39 07/05/22 06:56  Glucose-Capillary 70 - 99 mg/dL 232 (H) 199 (H) 218 (H)    Latest Reference Range & Units 07/04/22 07:55 07/04/22 11:31 07/04/22 15:45 07/04/22 20:34  Glucose-Capillary 70 - 99 mg/dL 210 (H) 238 (H) 237 (H) 223 (H)   Review of Glycemic Control  Diabetes history: DM2 Outpatient Diabetes medications: Lantus 40 units QHS Current orders for Inpatient glycemic control: Semglee 10 units QHS, Novolog 0-15 units Q4H; Prednisone 30 mg QAM, Vital @ 55 ml/hr  Inpatient Diabetes Program Recommendations:    Insulin-Tube Feeding: Please consider ordering Novolog 3 units Q4H for tube feeding coverage. If tube feeding is stopped or held then Novolog tube feeding coverage should also be stopped or held.  Thanks, Barnie Alderman, RN, MSN, [place] Diabetes Coordinator Inpatient Diabetes Program [phone] (Team Pager from 8am to [place])

## 2022-07-06 DIAGNOSIS — J9601 Acute respiratory failure with hypoxia: Secondary | ICD-10-CM | POA: Diagnosis not present

## 2022-07-06 LAB — RENAL FUNCTION PANEL
Albumin: 2.7 g/dL — ABNORMAL LOW (ref 3.5–5.0)
Anion gap: 10 (ref 5–15)
BUN: 89 mg/dL — ABNORMAL HIGH (ref 6–20)
CO2: 20 mmol/L — ABNORMAL LOW (ref 22–32)
Calcium: 8.5 mg/dL — ABNORMAL LOW (ref 8.9–10.3)
Chloride: 110 mmol/L (ref 98–111)
Creatinine, Ser: 2.77 mg/dL — ABNORMAL HIGH (ref 0.61–1.24)
GFR, Estimated: 26 mL/min — ABNORMAL LOW (ref >60)
Glucose, Bld: 254 mg/dL — ABNORMAL HIGH (ref 70–99)
Phosphorus: 5.1 mg/dL — ABNORMAL HIGH (ref 2.5–4.6)
Potassium: 5.3 mmol/L — ABNORMAL HIGH (ref 3.5–5.1)
Sodium: 140 mmol/L (ref 135–145)

## 2022-07-06 LAB — GLUCOSE, CAPILLARY
Glucose-Capillary: 195 mg/dL — ABNORMAL HIGH (ref 70–99)
Glucose-Capillary: 168 mg/dL — ABNORMAL HIGH (ref 70–99)
Glucose-Capillary: 237 mg/dL — ABNORMAL HIGH (ref 70–99)
Glucose-Capillary: 261 mg/dL — ABNORMAL HIGH (ref 70–99)
Glucose-Capillary: 290 mg/dL — ABNORMAL HIGH (ref 70–99)

## 2022-07-06 LAB — TRIGLYCERIDES: Triglycerides: 160 mg/dL — ABNORMAL HIGH (ref <150)

## 2022-07-06 MED ORDER — DEXMEDETOMIDINE HCL IN NACL 400 MCG/100ML IV SOLN
0.4000 ug/kg/h | INTRAVENOUS | Status: DC
  Administered 2022-07-06 (×2): 0.7 ug/kg/h via INTRAVENOUS
  Administered 2022-07-06: 0.4 ug/kg/h via INTRAVENOUS
  Administered 2022-07-06: 0.8 ug/kg/h via INTRAVENOUS
  Administered 2022-07-07 – 2022-07-09 (×10): 0.7 ug/kg/h via INTRAVENOUS
  Administered 2022-07-09: 0.5 ug/kg/h via INTRAVENOUS
  Filled 2022-07-06 (×16): qty 100

## 2022-07-06 MED ORDER — ROCURONIUM BROMIDE 10 MG/ML (PF) SYRINGE
PREFILLED_SYRINGE | INTRAVENOUS | Status: AC
  Filled 2022-07-06: qty 10

## 2022-07-06 MED ORDER — MIDAZOLAM HCL 2 MG/2ML IJ SOLN
INTRAMUSCULAR | Status: AC
  Filled 2022-07-06: qty 2

## 2022-07-06 MED ORDER — KETAMINE HCL 50 MG/5ML IJ SOSY
PREFILLED_SYRINGE | INTRAMUSCULAR | Status: AC
  Filled 2022-07-06: qty 10

## 2022-07-06 MED ORDER — SUCCINYLCHOLINE CHLORIDE 200 MG/10ML IV SOSY
PREFILLED_SYRINGE | INTRAVENOUS | Status: AC
  Filled 2022-07-06: qty 10

## 2022-07-06 MED ORDER — FENTANYL CITRATE PF 50 MCG/ML IJ SOSY
PREFILLED_SYRINGE | INTRAMUSCULAR | Status: AC
  Filled 2022-07-06: qty 2

## 2022-07-06 MED ORDER — FENTANYL CITRATE (PF) 100 MCG/2ML IJ SOLN
INTRAMUSCULAR | Status: AC
  Filled 2022-07-06: qty 2

## 2022-07-06 MED ORDER — ETOMIDATE 2 MG/ML IV SOLN
INTRAVENOUS | Status: AC
  Filled 2022-07-06: qty 20

## 2022-07-06 NOTE — Progress Notes (Signed)
Patient tolerated pressure support for about 3 hours today with sedation reduced to half, 40% Fi02, 10/5 for the first 2 hours, then 5/5 for the last hour before presenting with elevated blood pressure 193/52, HR elevated to 100s, and diaphoretic. Patient is now back on Vent and sedation resumed and is being titrated to achieve desired RASS goal.

## 2022-07-06 NOTE — Progress Notes (Signed)
Progress Note   [name] RWE:[id number] DOB: 1960-09-15 DOA: 06/29/2022     7 DOS: the patient was seen and examined on 07/06/2022   Brief hospital course: Oscar Rose is a 61 y.o. male with medical history significant of type 2 diabetes with nephropathy, gastroesophageal reflux disease, hypertension, hyperlipidemia, chronic kidney disease stage IIIb, chronic respiratory failure, sleep apnea, class II obesity and recent hospitalization with multifocal pneumonia; presented from his extended care facility secondary to worsening shortness of breath and hypoxia.  On arrival to the emergency department patient condition further deteriorates and he was found to be obtunded with oxygen saturation in the mid 60s.  EMS personnel transported him providing bagging with bag-valve-mask.  Patient was intubated, mechanically ventilated and sedated.  At time of my evaluation unable to answer any questions or provide any history. Per EDP notes, EMS reports and information from his facility no chest pain, no nausea, no vomiting, no dysuria, no melena, no hematochezia, no focal deficits or any other complaints.   Workup in the ED demonstrating vascular congestion, pulmonary edema with concern for ARDS and multifocal pneumonia.  Patient COVID PCR was negative but he was found to be positive for influenza A.  TRH has been consulted to place patient in the hospital for further evaluation and management.  07/03/22  -Dr. Halford Chessman recommends transfer to [facility] ICU with pulmonary critical care service managing patient/primary team -Patient with significant increased oxygen requirement, more difficult to ventilate over the past couple of days and demonstrating worsening renal function with marginal output while receiving diuresis.   -Patient is currently awaiting ICU bed at [facility] for further critical care management. --on 07/04/22 Failed sedation vacation,  failed weaning trial  07/05/22   -Patient did poorly with sedation vacation and weaning trial-- Patient weaned on 10/5 with 40% from 1322 to 1455. At 1455 desaturation to 86%,1500 BP 221/82 patient was returned to rest mode of PRVC 26/ 600/ 80%/ PEEP 5. He was unable to maintain an adequate SpO2 on 40% at that time.,  FiO2 was increased to 80% temporarily  07/06/22 -Switched from IV propofol to Precedex--did better with weaning trial on Precedex  Assessment and Plan: 1)Acute respiratory failure with hypoxia ([place]) -Acute respiratory failure with hypoxia secondary to pulmonary edema/acute on chronic systolic heart failure, influenza  A and multifocal pneumonia. -Remains intubated and ventilated-- Vent Settings:- PRVC/40%/5/26/600 -Okay to completed Tamiflu as ordered (5 day course started on 06/29/2022) Treated with cefepime and azithromycin--he completed azithromycin -Zosyn discontinued on 07/06/2022 -Treated with Solu-Medrol, okay to continue prednisone -Continue gentle diuresis with Lasix 20 mg every 12 hours -Continue bronchodilators -Pulmonary critical care consult from Dr. Halford Chessman appreciated --Dr. Halford Chessman recommends transfer to [facility] ICU with pulmonary critical care service managing patient/primary team 07/06/22 -Hypertriglyceridemia noted--- -Switched from IV propofol to Precedex--did better with weaning trial on Precedex  2)HCAP (healthcare-associated pneumonia) --Management as above #1 Blood cultures from 06/29/2022 with PROPIONIBACTERIUM ACNES  Suspect this is a contaminant--patient completed azithromycin -Was treated with IV cefepime -Switch to Zosyn which was discontinued on 07/06/2022  3)AKI----acute kidney injury on CKD stage -IV -Creatinine was 2.2 on 11/08/2021, creatinine was 2.8 on 05/15/2022 -Creatinine was 2.55 on admission on 06/29/2022 -Creatinine continues to improve after peaking at 3.66 -renally adjust medications, avoid nephrotoxic agents / dehydration  / hypotension  4) hyperkalemia--- in the  setting of AKI on CKD 4 as above #3 -Resolved with interventions  5) acute on chronic anemia--Hgb is down to 8.1  from 10.2 on admission -Baseline hemoglobin usually around 9 -No obvious bleeding at this time continue to monitor  6)ARDS (adult respiratory distress syndrome) (HCC) -Continue ARDS protocol and his ventilatory settings - PCCM recommendations by Dr. Halford Chessman appreciated -Please see #1 above  7)Elevated Troponin -Appears to be demand ischemia in the setting of hypoxic respiratory failure -No ischemic changes appreciated on EKG and telementry -Echo from 05/13/2022 with EF of 55 to 60%, still showed wall motion abnormalities, with hypokinesis of the left ventricular, apical anterior inferior wall and lateral as well as inferior lateral and apical segments -No mitral stenosis or aortic stenosis -Continue treatment with Lipitor, aspirin and Plavix  8)Influenza A with pneumonia Completed Tamiflu as above #1 -Further management as above #1  9)Acute on chronic systolic CHF (congestive heart failure) (HCC) -Elevated BNP --Continue IV diuresis -ARDS protocol on his ventilatory settings. -Echo as above #7  10)Type 2 diabetes mellitus ([place]) -With nephropathy; chronic kidney disease a stage IV -A1c 5.5 reflecting excellent diabetic control PTA -Suspect steroid-induced hyperglycemia -Continue Lantus 10 units nightly Use Novolog/Humalog Sliding scale insulin with Accu-Cheks/Fingersticks as ordered   11)Hypertension -BP is not at goal, continue amlodipine, hydralazine,  -may use IV labetalol as needed elevated BP - 12)FEN--tube feeding via OG tube with H20 flushes - 13)Social/Ethics---no living relatives here in [place] (his blood relatives live [place]),  Ms April Brame-- [phone]---is his Primary contact (she is the sister of Oscar Rose's ex Oscar Rose who died in Oct 26, 2015) -  -Prophylaxis Continue Protonix for GI prophylaxis -Continue Lovenox for DVT prophylaxis  CRITICAL  CARE Performed by: Roxan Hockey  Total critical care time: 53 minutes  Critical care time was exclusive of separately billable procedures and treating other patients.  Critical care was necessary to treat or prevent imminent or life-threatening deterioration. - -Remains intubated and ventilated-- Vent Settings:- PRVC/40%/5/26/600 -Continue propofol and fentanyl for sedation 07/06/22 -Hypertriglyceridemia noted--- -Switched from IV propofol to Precedex--did better with weaning trial on Precedex  Critical care was time spent personally by me on the following activities: development of treatment plan with patient and/or surrogate as well as nursing, discussions with consultants, evaluation of patient's response to treatment, examination of patient, obtaining history from patient or surrogate, ordering and performing treatments and interventions, ordering and review of laboratory studies, ordering and review of radiographic studies, pulse oximetry and re-evaluation of patient's condition.   Subjective:   07/06/22 -Hypertriglyceridemia noted--- -Switched from IV propofol to Precedex--did better with weaning trial on Precedex -Voiding really well  Physical Exam: Vitals:   07/06/22 1539 07/06/22 1543 07/06/22 1549 07/06/22 1600  BP: (!) 193/52   (!) 165/36  Pulse: 96   68  Resp: (!) 26   17  Temp:    97.9 F (36.6 C)  TempSrc:    Axillary  SpO2: 95% 97% 98% 98%  Weight:      Height:       Physical Exam Gen:-Intubated and sedated, HEENT:- [place].AT, No sclera icterus ET tube and OG tube noted Lungs-, slightly diminished air movement , no wheezing   CV- S1, S2 normal, RRR Abd-  +ve B.Sounds, Abd Soft, ND Extremity/Skin:- +ve  edema,   good pedal pulses  NeuroPsych-Limited exam due to being intubated and sedated -MSK-prior right big toe amputation, prior left transmetatarsal amputation GU-Foley with clear urine  Family Communication: Updated Ms April Brame-- [phone]---is  his Primary contact (she is the sister of Mr. Kroner's ex Oscar Rose who died in October 26, 2015)  Disposition: transfer to [facility] ICU  with pulmonary critical care service managing patient/primary team Status is: Inpatient Remains inpatient appropriate because: Continue treatment for ARDS, multifocal pneumonia and pulmonary edema due to acute on chronic systolic heart failure.   Planned Discharge Destination: Skilled nursing facility... Currently Not stable for discharge   Author: Roxan Hockey, MD 07/06/2022 5:52 PM  For on call review [email or web address].

## 2022-07-07 ENCOUNTER — Inpatient Hospital Stay (HOSPITAL_COMMUNITY): Payer: Medicare Other

## 2022-07-07 DIAGNOSIS — J9601 Acute respiratory failure with hypoxia: Secondary | ICD-10-CM | POA: Diagnosis not present

## 2022-07-07 LAB — CBC
HCT: 26.3 % — ABNORMAL LOW (ref 39.0–52.0)
Hemoglobin: 8.2 g/dL — ABNORMAL LOW (ref 13.0–17.0)
MCH: 28.5 pg (ref 26.0–34.0)
MCHC: 31.2 g/dL (ref 30.0–36.0)
MCV: 91.3 fL (ref 80.0–100.0)
Platelets: 208 10*3/uL (ref 150–400)
RBC: 2.88 MIL/uL — ABNORMAL LOW (ref 4.22–5.81)
RDW: 14.1 % (ref 11.5–15.5)
WBC: 5.6 10*3/uL (ref 4.0–10.5)
nRBC: 0 % (ref 0.0–0.2)

## 2022-07-07 LAB — RENAL FUNCTION PANEL
Albumin: 2.6 g/dL — ABNORMAL LOW (ref 3.5–5.0)
Anion gap: 11 (ref 5–15)
BUN: 89 mg/dL — ABNORMAL HIGH (ref 6–20)
CO2: 21 mmol/L — ABNORMAL LOW (ref 22–32)
Calcium: 8.6 mg/dL — ABNORMAL LOW (ref 8.9–10.3)
Chloride: 110 mmol/L (ref 98–111)
Creatinine, Ser: 2.68 mg/dL — ABNORMAL HIGH (ref 0.61–1.24)
GFR, Estimated: 27 mL/min — ABNORMAL LOW (ref >60)
Glucose, Bld: 224 mg/dL — ABNORMAL HIGH (ref 70–99)
Phosphorus: 5.5 mg/dL — ABNORMAL HIGH (ref 2.5–4.6)
Potassium: 4.7 mmol/L (ref 3.5–5.1)
Sodium: 142 mmol/L (ref 135–145)

## 2022-07-07 LAB — GLUCOSE, CAPILLARY
Glucose-Capillary: 185 mg/dL — ABNORMAL HIGH (ref 70–99)
Glucose-Capillary: 199 mg/dL — ABNORMAL HIGH (ref 70–99)
Glucose-Capillary: 232 mg/dL — ABNORMAL HIGH (ref 70–99)
Glucose-Capillary: 238 mg/dL — ABNORMAL HIGH (ref 70–99)
Glucose-Capillary: 243 mg/dL — ABNORMAL HIGH (ref 70–99)
Glucose-Capillary: 261 mg/dL — ABNORMAL HIGH (ref 70–99)
Glucose-Capillary: 268 mg/dL — ABNORMAL HIGH (ref 70–99)
Glucose-Capillary: 306 mg/dL — ABNORMAL HIGH (ref 70–99)

## 2022-07-07 MED ORDER — METOCLOPRAMIDE HCL 5 MG/ML IJ SOLN
5.0000 mg | Freq: Three times a day (TID) | INTRAMUSCULAR | Status: AC
  Administered 2022-07-07 – 2022-07-08 (×3): 5 mg via INTRAVENOUS
  Filled 2022-07-07 (×3): qty 2

## 2022-07-07 NOTE — Progress Notes (Signed)
Approximately 0950 ETT advanced and secured ( per xray) to 27 cm  at the lip. BBS noted, vitals stable and adequate tidal volumes noted. RT will continue to monitor.

## 2022-07-07 NOTE — Progress Notes (Addendum)
About 2300 07/06/22 pt has had 4 liquid stools in less than 24 hr, pt vomited after getting cleaned up, tube feed turned off and zofran given, VSS, notified Dr. Josephine Cables as well that pt hasn't had CXR since 11/30. Per Dr. Josephine Cables place FMS, leave tube feed off a couple hours then resume, and CXR in AM.  0200 placing flexi pt had an additional episode of vomiting, notified Dr. Josephine Cables will keep tube feed off a couple more hours, ABD soft with some distention.

## 2022-07-07 NOTE — Progress Notes (Signed)
Progress Note   [name] HUD:[id number] DOB: 09-03-1960 DOA: 06/29/2022     8 DOS: the patient was seen and examined on 07/07/2022   Brief hospital course: Oscar Rose is a 61 y.o. male with medical history significant of type 2 diabetes with nephropathy, gastroesophageal reflux disease, hypertension, hyperlipidemia, chronic kidney disease stage IIIb, chronic respiratory failure, sleep apnea, class II obesity and recent hospitalization with multifocal pneumonia; presented from his extended care facility secondary to worsening shortness of breath and hypoxia.  On arrival to the emergency department patient condition further deteriorates and he was found to be obtunded with oxygen saturation in the mid 60s.  EMS personnel transported him providing bagging with bag-valve-mask.  Patient was intubated, mechanically ventilated and sedated.  At time of my evaluation unable to answer any questions or provide any history. Per EDP notes, EMS reports and information from his facility no chest pain, no nausea, no vomiting, no dysuria, no melena, no hematochezia, no focal deficits or any other complaints.   Workup in the ED demonstrating vascular congestion, pulmonary edema with concern for ARDS and multifocal pneumonia.  Patient COVID PCR was negative but he was found to be positive for influenza A.  TRH has been consulted to place patient in the hospital for further evaluation and management.  07/03/22  -Dr. Halford Chessman recommends transfer to [facility] ICU with pulmonary critical care service managing patient/primary team -Patient with significant increased oxygen requirement, more difficult to ventilate over the past couple of days and demonstrating worsening renal function with marginal output while receiving diuresis.   -Patient is currently awaiting ICU bed at [facility] for further critical care management. --on 07/04/22 Failed sedation vacation,  failed weaning trial  07/05/22   -Patient did poorly with sedation vacation and weaning trial-- Patient weaned on 10/5 with 40% from 1322 to 1455. At 1455 desaturation to 86%,1500 BP 221/82 patient was returned to rest mode of PRVC 26/ 600/ 80%/ PEEP 5. He was unable to maintain an adequate SpO2 on 40% at that time.,  FiO2 was increased to 80% temporarily  07/06/22 -Switched from IV propofol to Precedex--did better with weaning trial (almost 3 hrs) on Precedex  07/07/22 -Did not do well with pressure support/weaning trial--- lasted less than 2 hours -Back on vent -ET tube advanced  Assessment and Plan: 1)Acute respiratory failure with hypoxia ([place]) -Acute respiratory failure with hypoxia secondary to pulmonary edema/acute on chronic systolic heart failure, influenza  A and multifocal pneumonia. -Remains intubated and ventilated-- Vent Settings:- PRVC/40%/5/26/600 -Okay to completed Tamiflu as ordered (5 day course started on 06/29/2022) Treated with cefepime and azithromycin--he completed azithromycin -Zosyn discontinued on 07/06/2022 -Treated with Solu-Medrol, okay to continue prednisone -Continue gentle diuresis with Lasix 20 mg every 12 hours -Continue bronchodilators -Pulmonary critical care consult from Dr. Halford Chessman appreciated --Dr. Halford Chessman recommends transfer to [facility] ICU with pulmonary critical care service managing patient/primary team 07/07/22 -Did not do well with pressure support/weaning trial--- lasted less than 2 hours -Back on vent -ET tube advanced -Continue Precedex  2)HCAP (healthcare-associated pneumonia) --Management as above #1 Blood cultures from 06/29/2022 with PROPIONIBACTERIUM ACNES  Suspect this is a contaminant--patient completed azithromycin -Was treated with IV cefepime -Switch to Zosyn which was discontinued on 07/06/2022  3)AKI----acute kidney injury on CKD stage -IV -Creatinine was 2.2 on 11/08/2021, creatinine was 2.8 on 05/15/2022 -Creatinine was 2.55 on admission on  06/29/2022 -Creatinine continues to improve after peaking at 3.66 -renally adjust medications, avoid nephrotoxic agents /  dehydration  / hypotension  4) hyperkalemia--- in the setting of AKI on CKD 4 as above #3 -Resolved with interventions  5) acute on chronic anemia--Hgb is down to 8.1 from 10.2 on admission -Baseline hemoglobin usually around 9 -No obvious bleeding at this time continue to monitor  6)ARDS (adult respiratory distress syndrome) (HCC) -Continue ARDS protocol and his ventilatory settings - PCCM recommendations by Dr. Halford Chessman appreciated -Please see #1 above  7)Elevated Troponin -Appears to be demand ischemia in the setting of hypoxic respiratory failure -No ischemic changes appreciated on EKG and telementry -Echo from 05/13/2022 with EF of 55 to 60%, still showed wall motion abnormalities, with hypokinesis of the left ventricular, apical anterior inferior wall and lateral as well as inferior lateral and apical segments -No mitral stenosis or aortic stenosis -Continue treatment with Lipitor, aspirin and Plavix  8)Influenza A with pneumonia Completed Tamiflu as above #1 -Further management as above #1  9)Acute on chronic systolic CHF (congestive heart failure) (HCC) -Elevated BNP --Continue IV diuresis -ARDS protocol on his ventilatory settings. -Echo as above #7  10)Type 2 diabetes mellitus ([place]) -With nephropathy; chronic kidney disease a stage IV -A1c 5.5 reflecting excellent diabetic control PTA -Suspect steroid-induced hyperglycemia -Continue Lantus 10 units nightly Use Novolog/Humalog Sliding scale insulin with Accu-Cheks/Fingersticks as ordered   11)Hypertension -continue amlodipine, hydralazine,  -may use IV labetalol as needed elevated BP - 12)FEN--tube feeding via OG tube with H20 flushes - 13)Social/Ethics---no living relatives here in the [place] (his blood relatives live [place]),  Ms April Brame-- [phone]---is his Primary contact (she is the  sister of [name]'s ex Oscar Rose who died in 08-Nov-2015) -  -Prophylaxis Continue Protonix for GI prophylaxis -Continue Lovenox for DVT prophylaxis  CRITICAL CARE Performed by: Roxan Hockey  Total critical care time: 59 minutes  Critical care time was exclusive of separately billable procedures and treating other patients.  Critical care was necessary to treat or prevent imminent or life-threatening deterioration. - -Remains intubated and ventilated-- Vent Settings:- PRVC/40%/5/26/600 -Continue  fentanyl for sedation 07/07/22 -Did not do well with pressure support/weaning trial--- lasted less than 2 hours -Back on vent -ET tube advanced  Critical care was time spent personally by me on the following activities: development of treatment plan with patient and/or surrogate as well as nursing, discussions with consultants, evaluation of patient's response to treatment, examination of patient, obtaining history from patient or surrogate, ordering and performing treatments and interventions, ordering and review of laboratory studies, ordering and review of radiographic studies, pulse oximetry and re-evaluation of patient's condition.   Subjective:   07/07/22 -Did not do well with pressure support/weaning trial--- lasted less than 2 hours -Back on vent -ET tube advanced -Having BMs =-Had emesis overnight--so tube feeds were held for hours  Physical Exam: Vitals:   07/07/22 1600 07/07/22 1615 07/07/22 1617 07/07/22 1730  BP: (!) 119/45 (!) 116/59  107/72  Pulse: 76 78  75  Resp: 14 16  18   Temp:      TempSrc:      SpO2: 100% 100% 100% 100%  Weight:      Height:       Physical Exam Gen:-Intubated and sedated, HEENT:- [place].AT, No sclera icterus ET tube and OG tube noted Lungs-, slightly diminished air movement , no wheezing   CV- S1, S2 normal, RRR Abd-  +ve B.Sounds, Abd Soft, ND Extremity/Skin:- +ve  edema,   good pedal pulses  NeuroPsych-Limited exam due to being intubated  and sedated -MSK-prior right big toe amputation,  prior left transmetatarsal amputation GU-Foley with clear urine Rectal Tube in situ  Family Communication: Updated Ms April Brame-- [phone]---she is his Primary contact (she is the sister of Mr. Perlmutter's ex Fiancee who died in 10-22-2015)  Disposition: transfer to [facility] ICU with pulmonary critical care service managing patient/primary team Status is: Inpatient Remains inpatient appropriate because: Continue treatment for ARDS, multifocal pneumonia and pulmonary edema due to acute on chronic systolic heart failure.   Planned Discharge Destination: Skilled nursing facility... Currently Not stable for discharge   Author: Roxan Hockey, MD 07/07/2022 6:29 PM  For on call review [email or web address].

## 2022-07-07 NOTE — Progress Notes (Signed)
Patient tolerated pressure support for about 1.75 hours today with sedation reduced to half, 40% Fi02, 10/5. Not able to follow commands and lift his head and stick out his tongue slightly. Patient presented with excessive agitation, use of accessory muscles and belly breathing and diaphoretic about 1.75 hours into weaning. Patient is now back on Vent and sedation resumed and is being titrated to achieve desired RASS goal. Patient is tolerating TF. HOB elevated 45 degrees.

## 2022-07-08 ENCOUNTER — Inpatient Hospital Stay (HOSPITAL_COMMUNITY): Payer: Medicare Other

## 2022-07-08 DIAGNOSIS — J9601 Acute respiratory failure with hypoxia: Secondary | ICD-10-CM | POA: Diagnosis not present

## 2022-07-08 LAB — COMPREHENSIVE METABOLIC PANEL
ALT: 14 U/L (ref 0–44)
AST: 13 U/L — ABNORMAL LOW (ref 15–41)
Albumin: 2.5 g/dL — ABNORMAL LOW (ref 3.5–5.0)
Alkaline Phosphatase: 55 U/L (ref 38–126)
Anion gap: 9 (ref 5–15)
BUN: 92 mg/dL — ABNORMAL HIGH (ref 6–20)
CO2: 23 mmol/L (ref 22–32)
Calcium: 8.6 mg/dL — ABNORMAL LOW (ref 8.9–10.3)
Chloride: 113 mmol/L — ABNORMAL HIGH (ref 98–111)
Creatinine, Ser: 2.66 mg/dL — ABNORMAL HIGH (ref 0.61–1.24)
GFR, Estimated: 27 mL/min — ABNORMAL LOW (ref >60)
Glucose, Bld: 194 mg/dL — ABNORMAL HIGH (ref 70–99)
Potassium: 4.5 mmol/L (ref 3.5–5.1)
Sodium: 145 mmol/L (ref 135–145)
Total Bilirubin: 0.5 mg/dL (ref 0.3–1.2)
Total Protein: 6.6 g/dL (ref 6.5–8.1)

## 2022-07-08 LAB — CBC
HCT: 26.2 % — ABNORMAL LOW (ref 39.0–52.0)
Hemoglobin: 8.1 g/dL — ABNORMAL LOW (ref 13.0–17.0)
MCH: 28.5 pg (ref 26.0–34.0)
MCHC: 30.9 g/dL (ref 30.0–36.0)
MCV: 92.3 fL (ref 80.0–100.0)
Platelets: 239 10*3/uL (ref 150–400)
RBC: 2.84 MIL/uL — ABNORMAL LOW (ref 4.22–5.81)
RDW: 14.1 % (ref 11.5–15.5)
WBC: 5.6 10*3/uL (ref 4.0–10.5)
nRBC: 0 % (ref 0.0–0.2)

## 2022-07-08 LAB — GLUCOSE, CAPILLARY
Glucose-Capillary: 189 mg/dL — ABNORMAL HIGH (ref 70–99)
Glucose-Capillary: 211 mg/dL — ABNORMAL HIGH (ref 70–99)
Glucose-Capillary: 264 mg/dL — ABNORMAL HIGH (ref 70–99)
Glucose-Capillary: 275 mg/dL — ABNORMAL HIGH (ref 70–99)
Glucose-Capillary: 223 mg/dL — ABNORMAL HIGH (ref 70–99)

## 2022-07-08 MED ORDER — INSULIN ASPART 100 UNIT/ML IJ SOLN
5.0000 [IU] | INTRAMUSCULAR | Status: DC
  Administered 2022-07-08 – 2022-07-09 (×6): 5 [IU] via SUBCUTANEOUS

## 2022-07-08 MED ORDER — INSULIN GLARGINE-YFGN 100 UNIT/ML ~~LOC~~ SOLN
15.0000 [IU] | Freq: Every day | SUBCUTANEOUS | Status: DC
  Administered 2022-07-08: 15 [IU] via SUBCUTANEOUS
  Filled 2022-07-08 (×2): qty 0.15

## 2022-07-08 MED ORDER — METOLAZONE 5 MG PO TABS
5.0000 mg | ORAL_TABLET | Freq: Once | ORAL | Status: AC
  Administered 2022-07-08: 5 mg via ORAL
  Filled 2022-07-08: qty 1

## 2022-07-08 NOTE — Progress Notes (Signed)
Progress Note   [name]:[id number] DOB: 20-Apr-1961 DOA: 06/29/2022     9 DOS: the patient was seen and examined on 07/08/2022   Brief hospital course: Oscar Rose is a 61 y.o. male with medical history significant of type 2 diabetes with nephropathy, gastroesophageal reflux disease, hypertension, hyperlipidemia, chronic kidney disease stage IIIb, chronic respiratory failure, sleep apnea, class II obesity and recent hospitalization with multifocal pneumonia; presented from his extended care facility secondary to worsening shortness of breath and hypoxia.  On arrival to the emergency department patient condition further deteriorates and he was found to be obtunded with oxygen saturation in the mid 60s.  EMS personnel transported him providing bagging with bag-valve-mask.  Patient was intubated, mechanically ventilated and sedated.  At time of my evaluation unable to answer any questions or provide any history. Per EDP notes, EMS reports and information from his facility no chest pain, no nausea, no vomiting, no dysuria, no melena, no hematochezia, no focal deficits or any other complaints.   Workup in the ED demonstrating vascular congestion, pulmonary edema with concern for ARDS and multifocal pneumonia.  Patient COVID PCR was negative but he was found to be positive for influenza A.  TRH has been consulted to place patient in the hospital for further evaluation and management.  07/03/22  -Dr. Halford Rose recommends transfer to [facility] ICU with pulmonary critical care service managing patient/primary team -Patient with significant increased oxygen requirement, more difficult to ventilate over the past couple of days and demonstrating worsening renal function with marginal output while receiving diuresis.   -Patient is currently awaiting ICU bed at [facility] for further critical care management. --on 07/04/22 Failed sedation vacation,  failed weaning trial  07/05/22   -Patient did poorly with sedation vacation and weaning trial-- Patient weaned on 10/5 with 40% from 1322 to 1455. At 1455 desaturation to 86%,1500 BP 221/82 patient was returned to rest mode of PRVC 26/ 600/ 80%/ PEEP 5. He was unable to maintain an adequate SpO2 on 40% at that time.,  FiO2 was increased to 80% temporarily  07/06/22 -Switched from IV propofol to Precedex--did better with weaning trial (almost 3 hrs) on Precedex  07/07/22 -Did not do well with pressure support/weaning trial--- lasted less than 2 hours -Back on vent -ET tube advanced  Assessment and Plan: 1)Acute respiratory failure with hypoxia ([place]) -Acute respiratory failure with hypoxia secondary to pulmonary edema/acute on chronic systolic heart failure, influenza  A and multifocal pneumonia. -Remains intubated and ventilated-- Vent Settings:- PRVC/40%/5/26/600 -Okay to completed Tamiflu as ordered (5 day course started on 06/29/2022) Treated with cefepime and azithromycin--he completed azithromycin -Zosyn discontinued on 07/06/2022 -Treated with Solu-Medrol, okay to continue prednisone -Continue gentle diuresis with Lasix 20 mg every 12 hours -Continue bronchodilators -Pulmonary critical care consult from Dr. Halford Rose and Dr Oscar Rose appreciated -07/08/22 -Did much better with weaning trial on Precedex---remains on CPAP mode for several hours -Tolerating tube feeding well -Continue Precedex  2)HCAP (healthcare-associated pneumonia) --Management as above #1 Blood cultures from 06/29/2022 with Oscar Rose  Suspect this is a contaminant--patient completed azithromycin -Was treated with IV cefepime -Switch to Zosyn which was discontinued on 07/06/2022  3)AKI----acute kidney injury on CKD stage -IV -Creatinine was 2.2 on 11/08/2021, creatinine was 2.8 on 05/15/2022 -Creatinine was 2.55 on admission on 06/29/2022 -Creatinine continues to improve after peaking at 3.66 -renally adjust medications, avoid nephrotoxic  agents / dehydration  / hypotension  4) hyperkalemia--- in the setting of AKI on CKD  4 as above #3 -Resolved with interventions  5) acute on chronic anemia--Hgb is down to 8.1 from 10.2 on admission -Baseline hemoglobin usually around 9 -No obvious bleeding at this time continue to monitor  6)ARDS (adult respiratory distress syndrome) (HCC) -Continue ARDS protocol and his ventilatory settings - PCCM recommendations by Dr. Halford Rose appreciated -Please see #1 above  7)Elevated Troponin -Appears to be demand ischemia in the setting of hypoxic respiratory failure -No ischemic changes appreciated on EKG and telementry -Echo from 05/13/2022 with EF of 55 to 60%, still showed wall motion abnormalities, with hypokinesis of the left ventricular, apical anterior inferior wall and lateral as well as inferior lateral and apical segments -No mitral stenosis or aortic stenosis -Continue treatment with Lipitor, aspirin and Plavix  8)Influenza A with pneumonia Completed Tamiflu as above #1 -Further management as above #1  9)Acute on chronic systolic CHF (congestive heart failure) (HCC) -Elevated BNP --Continue IV diuresis -ARDS protocol on his ventilatory settings. -Echo as above #7  10)Type 2 diabetes mellitus ([place]) -With nephropathy; chronic kidney disease a stage IV -A1c 5.5 reflecting excellent diabetic control PTA -Suspect steroid-induced hyperglycemia -Continue Lantus 10 units nightly Use Novolog/Humalog Sliding scale insulin with Accu-Cheks/Fingersticks as ordered   11)Hypertension -continue amlodipine, hydralazine,  -may use IV labetalol as needed elevated BP - 12)FEN--tube feeding via OG tube with H20 flushes - 13)Social/Ethics---no living Blood  relatives here in [place] (his blood relatives live [place]),  Oscar Rose-- [phone]---is his Primary contact (she is the sister of Oscar Rose's ex Oscar Rose who died in 11-05-15) -  -Prophylaxis Continue Protonix for GI  prophylaxis -Continue Lovenox for DVT prophylaxis  CRITICAL CARE Performed by: Oscar Rose  Total critical care time: 53 minutes  Critical care time was exclusive of separately billable procedures and treating other patients.  Critical care was necessary to treat or prevent imminent or life-threatening deterioration. - -Remains intubated and ventilated-- Vent Settings:- PRVC/40%/5/26/600 -Continue Precedex and  fentanyl for sedation  Critical care was time spent personally by me on the following activities: development of treatment plan with patient and/or surrogate as well as nursing, discussions with consultants, evaluation of patient's response to treatment, examination of patient, obtaining history from patient or surrogate, ordering and performing treatments and interventions, ordering and review of laboratory studies, ordering and review of radiographic studies, pulse oximetry and re-evaluation of patient's condition.   Subjective:   07/08/22 -Did much better with weaning trial on Precedex---remains on CPAP mode for several hours -Tolerating tube feeding well - No fevers  Physical Exam: Vitals:   07/08/22 1000 07/08/22 1100 07/08/22 1200 07/08/22 1300  BP: (!) 126/58 124/63 121/65 (!) 121/43  Pulse: 77 75 79 76  Resp: 18 15 15 14   Temp:      TempSrc:      SpO2: 100% 100% 100% 100%  Weight:      Height:       Physical Exam Gen:-Intubated and sedated, HEENT:- [place].AT, No sclera icterus ET tube and OG tube noted Lungs-, slightly diminished air movement , no wheezing   CV- S1, S2 normal, RRR Abd-  +ve B.Sounds, Abd Soft, ND Extremity/Skin:- +ve  edema,   good pedal pulses  NeuroPsych-Limited exam due to being intubated and sedated -MSK-prior right big toe amputation, prior left transmetatarsal amputation GU-Foley with clear urine Rectal Tube in situ  Family Communication: Updated Oscar Rose-- [phone]---she is his Primary contact (she is the sister of  Mr. Amesquita's ex Oscar Rose who died in 11/05/2015)  Disposition:  transfer to [facility] ICU with pulmonary critical care service managing patient/primary team Status is: Inpatient Remains inpatient appropriate because: Continue treatment for ARDS, multifocal pneumonia and pulmonary edema due to acute on chronic systolic heart failure.   Planned Discharge Destination: Skilled nursing facility... Currently Not stable for discharge   Author: Roxan Hockey, MD 07/08/2022 5:03 PM  For on call review [email or web address].

## 2022-07-08 NOTE — Progress Notes (Signed)
NAME:  Oscar Rose, MRN:  [id number], DOB:  Mar 26, 1961, LOS: 9 ADMISSION DATE:  06/29/2022, CONSULTATION DATE:  07/01/2022 REFERRING MD:  Dr. Dyann Kief, [place], CHIEF COMPLAINT:  Respiratory failure   History of Present Illness:  61 yo male resident of [place] was found unresponsive with SpO2 8%.  In ER he was diaphoretic with paradoxical breathing.  He required intubation.  He was found to have pneumonia from Influenza A and acute pulmonary edema.  He was previously  in hospital from 05/12/22 to 05/18/22 with multifocal pneumonia.    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, RLS, CVA 2017, Vit D deficiency, HFrEF  Significant Hospital Events: Including procedures, antibiotic start and stop dates in addition to other pertinent events   11/25 Admit, intubated 11/27 off pressors 11/28 worsening acidosis, increased O2 needs, request transfer to [facility] 11/29 completed tamiflu 11/30 start pressure support weaning    Scheduled Meds:  amLODipine  10 mg Per Tube Daily   aspirin  81 mg Per Tube Daily   atorvastatin  40 mg Per Tube Daily   Chlorhexidine Gluconate Cloth  6 each Topical Daily   clopidogrel  75 mg Per Tube Daily   docusate  200 mg Per Tube BID   enoxaparin (LOVENOX) injection  60 mg Subcutaneous Q24H   feeding supplement (PROSource TF20)  60 mL Per Tube BID   furosemide  20 mg Intravenous Q12H   hydrALAZINE  50 mg Per Tube TID   insulin aspart  0-15 Units Subcutaneous Q4H   insulin aspart  3 Units Subcutaneous Q4H   insulin glargine-yfgn  10 Units Subcutaneous QHS   mouth rinse  15 mL Mouth Rinse Q2H   pantoprazole (PROTONIX) IV  40 mg Intravenous Q24H   polyethylene glycol  17 g Per Tube Daily   predniSONE  20 mg Per Tube Q breakfast   sodium chloride flush  3 mL Intravenous Q12H   Continuous Infusions:  sodium chloride 10 mL/hr at  07/08/22 0613   dexmedetomidine (PRECEDEX) IV infusion 0.7 mcg/kg/hr (07/08/22 0955)   feeding supplement (VITAL 1.5 CAL) 55 mL/hr at 07/08/22 0613   fentaNYL infusion INTRAVENOUS 175 mcg/hr (07/08/22 0613)   propofol (DIPRIVAN) infusion Stopped (07/06/22 1202)   PRN Meds:.sodium chloride, acetaminophen (TYLENOL) oral liquid 160 mg/5 mL **OR** acetaminophen, fentaNYL (SUBLIMAZE) injection, ipratropium-albuterol, labetalol, midazolam, ondansetron **OR** ondansetron (ZOFRAN) IV, mouth rinse, sodium chloride flush    Interim History / Subjective:  Tolerating PSV wean and also weaning sedation with precedex/ fent   Objective   Blood pressure 124/63, pulse 75, temperature 98.6 F (37 C), resp. rate 15, height 5\' 11"  (1.803 m), weight 113.1 kg, SpO2 100 %.    Vent Mode: CPAP;PSV FiO2 (%):  [40 %] 40 % Set Rate:  [16 bmp] 16 bmp Vt Set:  [600 mL] 600 mL PEEP:  [5 cmH20-9 cmH20] 5 cmH20 Pressure Support:  [10 cmH20] 10 cmH20 Plateau Pressure:  [20 cmH20-22 cmH20] 20 cmH20   Intake/Output Summary (Last 24 hours) at 07/08/2022 1326 Last data filed at 07/08/2022 1136 Gross per 24 hour  Intake 1473.12 ml  Output 3900 ml  Net -2426.88 ml   Filed Weights   07/06/22 0500 07/07/22 0427 07/08/22 0500  Weight: 117.6 kg 117.5 kg 113.1 kg    Examination: Tmax:  99.6 General appearance:    black stare    No jvd Oropharynx  et  Neck supple Lungs with a distant and scattered exp > insp rhonchi bilaterally RRR no s3 or or sign murmur Abd obese with limied excursion  Extr warm with no edema or clubbing noted Neuro  Sensorium sedated,  no apparent motor deficits     I personally reviewed images and agree with radiology impression as follows:  CXR:   portable 07/07/22  1. ETT interval pullback to 6.1 cm from the carina. 2. NGT well inside the stomach but not fully seen. 3. Mild improvement in vascular congestion and edema but still mild-to-moderate. 4. Persistent bilateral perihilar and  lower lung zone opacities consistent with alveolar edema, but they are slightly less dense. 5. Small layering pleural effusions, smaller than previously. Resolved Hospital Problem list     Assessment & Plan:   Acute hypoxic/hypercapnic respiratory failure from Influenza pneumonia with possible bacterial superinfection and acute pulmonary edema. -  off abx 12/2  - continue pressure support as tolerated -  goal SpO2 > 92% -  prednisone 20 mg daily   - prn BDs >>> if tolerates extended wean on minimal sedation in am 12/5 plan to extubate  Acute on chronic HFrEF >> improved EF on Echo from 07/03/22. Elevated troponin from demand ischemia. Hx of HTN, HLD. DM type 2 poorly controlled with steroid induced hyperglycemia. Anemia of critical illness and chronic disease. AKI from overdiuresis; has tolerated lower dose of diuretics. Hyperkalemia. CKD 3b. - per primary team  Best Practice (right click and "Reselect all SmartList Selections" daily)   Diet/type: tubefeeds DVT prophylaxis: LMWH GI prophylaxis: PPI Lines: N/A Foley:  N/A Code Status:  full code Last date of multidisciplinary goals of care discussion [x]   Labs       Latest Ref Rng & Units 07/08/2022    3:24 AM 07/07/2022    3:40 AM 07/06/2022    1:21 PM  CMP  Glucose 70 - 99 mg/dL 194  224  254   BUN 6 - 20 mg/dL 92  89  89   Creatinine 0.61 - 1.24 mg/dL 2.66  2.68  2.77   Sodium 135 - 145 mmol/L 145  142  140   Potassium 3.5 - 5.1 mmol/L 4.5  4.7  5.3   Chloride 98 - 111 mmol/L 113  110  110   CO2 22 - 32 mmol/L 23  21  20    Calcium 8.9 - 10.3 mg/dL 8.6  8.6  8.5   Total Protein 6.5 - 8.1 g/dL 6.6     Total Bilirubin 0.3 - 1.2 mg/dL 0.5     Alkaline Phos 38 - 126 U/L 55     AST 15 - 41 U/L 13     ALT 0 - 44 U/L 14          Latest Ref Rng & Units 07/08/2022    3:24 AM 07/07/2022    3:40 AM 07/04/2022    5:23 AM  CBC  WBC 4.0 - 10.5 K/uL 5.6  5.6  4.9   Hemoglobin 13.0 - 17.0 g/dL 8.1  8.2  8.6   Hematocrit  39.0 - 52.0 % 26.2  26.3  26.5   Platelets 150 - 400 K/uL 239  208  185     ABG    Component Value Date/Time   PHART 7.53 (H) 07/04/2022 0448   PCO2ART 26 (L) 07/04/2022 0448   PO2ART 73 (L) 07/04/2022 0448   HCO3 22.0 07/04/2022 0448   TCO2 27 11/08/2021 0728   ACIDBASEDEF 6.2 (H) 07/01/2022 2005  O2SAT 97.3 07/04/2022 0448    CBG (last 3)  Recent Labs    07/08/22 0328 07/08/22 0731 07/08/22 1132  GLUCAP 189* 211* 264*     Christinia Gully, MD Pulmonary and [place] [phone]   After 7:00 pm call Elink  [phone]

## 2022-07-08 NOTE — Inpatient Diabetes Management (Signed)
Inpatient Diabetes Program Recommendations  AACE/ADA: New Consensus Statement on Inpatient Glycemic Control (2015)  Target Ranges:  Prepandial:   less than 140 mg/dL      Peak postprandial:   less than 180 mg/dL (1-2 hours)      Critically ill patients:  140 - 180 mg/dL   Lab Results  Component Value Date   GLUCAP 264 (H) 07/08/2022   HGBA1C 5.5 05/13/2022    Review of Glycemic Control  Latest Reference Range & Units 07/07/22 23:34 07/08/22 03:28 07/08/22 07:31 07/08/22 11:32  Glucose-Capillary 70 - 99 mg/dL 238 (H) 189 (H) 211 (H) 264 (H)  (H): Data is abnormally high Diabetes history: DM2 Outpatient Diabetes medications: Lantus 40 units QHS Current orders for Inpatient glycemic control: Novolog 3 units Q4H, Semglee 10 units QHS, Novolog 0-15 units Q4H;  Prednisone 20 mg QAM, Vital @ 55 ml/hr   Inpatient Diabetes Program Recommendations:     Consider increasing Semglee to 16 units QD and increase Novolog 6 units Q4H for tube feeding coverage. If tube feeding is stopped or held then Novolog tube feeding coverage should also be stopped or held.  Thanks, Bronson Curb, MSN, RNC-OB Diabetes Coordinator [phone] (8a-5p)

## 2022-07-09 DIAGNOSIS — J9601 Acute respiratory failure with hypoxia: Secondary | ICD-10-CM | POA: Diagnosis not present

## 2022-07-09 LAB — GLUCOSE, CAPILLARY
Glucose-Capillary: 175 mg/dL — ABNORMAL HIGH (ref 70–99)
Glucose-Capillary: 178 mg/dL — ABNORMAL HIGH (ref 70–99)
Glucose-Capillary: 232 mg/dL — ABNORMAL HIGH (ref 70–99)
Glucose-Capillary: 236 mg/dL — ABNORMAL HIGH (ref 70–99)
Glucose-Capillary: 252 mg/dL — ABNORMAL HIGH (ref 70–99)
Glucose-Capillary: 255 mg/dL — ABNORMAL HIGH (ref 70–99)

## 2022-07-09 LAB — BLOOD GAS, ARTERIAL
Acid-Base Excess: 4.5 mmol/L — ABNORMAL HIGH (ref 0.0–2.0)
Bicarbonate: 29.2 mmol/L — ABNORMAL HIGH (ref 20.0–28.0)
Drawn by: 23430
O2 Saturation: 100 %
Patient temperature: 36.4
pCO2 arterial: 42 mmHg (ref 32–48)
pH, Arterial: 7.45 (ref 7.35–7.45)
pO2, Arterial: 118 mmHg — ABNORMAL HIGH (ref 83–108)

## 2022-07-09 LAB — CBC
HCT: 27.4 % — ABNORMAL LOW (ref 39.0–52.0)
Hemoglobin: 8.6 g/dL — ABNORMAL LOW (ref 13.0–17.0)
MCH: 28.9 pg (ref 26.0–34.0)
MCHC: 31.4 g/dL (ref 30.0–36.0)
MCV: 91.9 fL (ref 80.0–100.0)
Platelets: 242 10*3/uL (ref 150–400)
RBC: 2.98 MIL/uL — ABNORMAL LOW (ref 4.22–5.81)
RDW: 13.9 % (ref 11.5–15.5)
WBC: 5.7 10*3/uL (ref 4.0–10.5)
nRBC: 0 % (ref 0.0–0.2)

## 2022-07-09 LAB — BRAIN NATRIURETIC PEPTIDE: B Natriuretic Peptide: 1650 pg/mL — ABNORMAL HIGH (ref 0.0–100.0)

## 2022-07-09 LAB — BASIC METABOLIC PANEL
Anion gap: 9 (ref 5–15)
BUN: 96 mg/dL — ABNORMAL HIGH (ref 6–20)
CO2: 23 mmol/L (ref 22–32)
Calcium: 8.9 mg/dL (ref 8.9–10.3)
Chloride: 113 mmol/L — ABNORMAL HIGH (ref 98–111)
Creatinine, Ser: 2.43 mg/dL — ABNORMAL HIGH (ref 0.61–1.24)
GFR, Estimated: 30 mL/min — ABNORMAL LOW (ref >60)
Glucose, Bld: 250 mg/dL — ABNORMAL HIGH (ref 70–99)
Potassium: 4.5 mmol/L (ref 3.5–5.1)
Sodium: 145 mmol/L (ref 135–145)

## 2022-07-09 MED ORDER — CLONIDINE HCL 0.1 MG PO TABS
0.1000 mg | ORAL_TABLET | Freq: Three times a day (TID) | ORAL | Status: DC
  Administered 2022-07-09 – 2022-07-11 (×6): 0.1 mg via ORAL
  Filled 2022-07-09 (×6): qty 1

## 2022-07-09 MED ORDER — PREDNISONE 20 MG PO TABS
20.0000 mg | ORAL_TABLET | Freq: Every day | ORAL | Status: DC
  Administered 2022-07-10: 20 mg via ORAL
  Filled 2022-07-09: qty 1

## 2022-07-09 MED ORDER — ETOMIDATE 2 MG/ML IV SOLN
INTRAVENOUS | Status: AC
  Filled 2022-07-09: qty 20

## 2022-07-09 MED ORDER — ROCURONIUM BROMIDE 10 MG/ML (PF) SYRINGE
PREFILLED_SYRINGE | INTRAVENOUS | Status: AC
  Filled 2022-07-09: qty 10

## 2022-07-09 MED ORDER — AMLODIPINE BESYLATE 5 MG PO TABS
10.0000 mg | ORAL_TABLET | Freq: Every day | ORAL | Status: DC
  Administered 2022-07-10: 10 mg via ORAL
  Filled 2022-07-09: qty 2

## 2022-07-09 MED ORDER — INSULIN GLARGINE-YFGN 100 UNIT/ML ~~LOC~~ SOLN
8.0000 [IU] | Freq: Every day | SUBCUTANEOUS | Status: DC
  Administered 2022-07-09 – 2022-07-10 (×2): 8 [IU] via SUBCUTANEOUS
  Filled 2022-07-09 (×2): qty 0.08

## 2022-07-09 MED ORDER — SUCCINYLCHOLINE CHLORIDE 200 MG/10ML IV SOSY
PREFILLED_SYRINGE | INTRAVENOUS | Status: AC
  Filled 2022-07-09: qty 10

## 2022-07-09 MED ORDER — HYDRALAZINE HCL 25 MG PO TABS
50.0000 mg | ORAL_TABLET | Freq: Three times a day (TID) | ORAL | Status: DC
  Administered 2022-07-09 – 2022-07-10 (×2): 50 mg via ORAL
  Filled 2022-07-09 (×2): qty 2

## 2022-07-09 MED ORDER — ATROPINE SULFATE 1 % OP SOLN
2.0000 [drp] | Freq: Four times a day (QID) | OPHTHALMIC | Status: DC
  Administered 2022-07-09 – 2022-07-10 (×6): 2 [drp] via SUBLINGUAL
  Filled 2022-07-09: qty 2

## 2022-07-09 MED ORDER — ASPIRIN 81 MG PO CHEW
81.0000 mg | CHEWABLE_TABLET | Freq: Every day | ORAL | Status: DC
  Administered 2022-07-11 – 2022-07-13 (×3): 81 mg via ORAL
  Filled 2022-07-09 (×4): qty 1

## 2022-07-09 MED ORDER — MIDAZOLAM HCL 2 MG/2ML IJ SOLN
INTRAMUSCULAR | Status: AC
  Filled 2022-07-09: qty 2

## 2022-07-09 MED ORDER — FUROSEMIDE 10 MG/ML IJ SOLN
60.0000 mg | Freq: Once | INTRAMUSCULAR | Status: AC
  Administered 2022-07-09: 60 mg via INTRAVENOUS
  Filled 2022-07-09: qty 6

## 2022-07-09 MED ORDER — FENTANYL CITRATE PF 50 MCG/ML IJ SOSY
PREFILLED_SYRINGE | INTRAMUSCULAR | Status: AC
  Filled 2022-07-09: qty 2

## 2022-07-09 MED ORDER — SODIUM CHLORIDE 3 % IN NEBU
4.0000 mL | INHALATION_SOLUTION | Freq: Two times a day (BID) | RESPIRATORY_TRACT | Status: DC
  Administered 2022-07-09 – 2022-07-10 (×2): 4 mL via RESPIRATORY_TRACT
  Filled 2022-07-09 (×2): qty 4

## 2022-07-09 MED ORDER — CLOPIDOGREL BISULFATE 75 MG PO TABS
75.0000 mg | ORAL_TABLET | Freq: Every day | ORAL | Status: DC
  Administered 2022-07-10 – 2022-07-13 (×4): 75 mg via ORAL
  Filled 2022-07-09 (×5): qty 1

## 2022-07-09 MED ORDER — FENTANYL CITRATE (PF) 100 MCG/2ML IJ SOLN
INTRAMUSCULAR | Status: AC
  Filled 2022-07-09: qty 2

## 2022-07-09 MED ORDER — METOPROLOL TARTRATE 5 MG/5ML IV SOLN
5.0000 mg | Freq: Once | INTRAVENOUS | Status: AC
  Administered 2022-07-09: 5 mg via INTRAVENOUS
  Filled 2022-07-09: qty 5

## 2022-07-09 MED ORDER — FUROSEMIDE 10 MG/ML IJ SOLN
20.0000 mg | Freq: Two times a day (BID) | INTRAMUSCULAR | Status: DC
  Administered 2022-07-10 – 2022-07-12 (×4): 20 mg via INTRAVENOUS
  Filled 2022-07-09 (×4): qty 2

## 2022-07-09 MED ORDER — ATORVASTATIN CALCIUM 40 MG PO TABS
40.0000 mg | ORAL_TABLET | Freq: Every day | ORAL | Status: DC
  Administered 2022-07-10 – 2022-07-13 (×4): 40 mg via ORAL
  Filled 2022-07-09 (×5): qty 1

## 2022-07-09 MED ORDER — DOCUSATE SODIUM 50 MG/5ML PO LIQD
200.0000 mg | Freq: Two times a day (BID) | ORAL | Status: DC
  Administered 2022-07-10: 100 mg via ORAL
  Administered 2022-07-10: 200 mg via ORAL
  Filled 2022-07-09 (×3): qty 20

## 2022-07-09 NOTE — Progress Notes (Signed)
NAME:  Oscar Rose, MRN:  [id number], DOB:  [birth year], LOS: 50 ADMISSION DATE:  06/29/2022, CONSULTATION DATE:  07/01/2022 REFERRING MD:  Dr. Dyann Kief, [place], CHIEF COMPLAINT:  Respiratory failure   History of Present Illness:  61 yo male resident of [place] was found unresponsive with SpO2 8%.  In ER he was diaphoretic with paradoxical breathing.  He required intubation.  He was found to have pneumonia from Influenza A and acute pulmonary edema.  He was previously  in hospital from 05/12/22 to 05/18/22 with multifocal pneumonia.    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, RLS, CVA 2017, Vit D deficiency, HFrEF  Significant Hospital Events: Including procedures, antibiotic start and stop dates in addition to other pertinent events   11/25 Admit, intubated 11/27 off pressors 11/28 worsening acidosis, increased O2 needs, request transfer to [facility] 11/29 completed tamiflu 11/30 start pressure support weaning    Scheduled Meds:  amLODipine  10 mg Per Tube Daily   aspirin  81 mg Per Tube Daily   atorvastatin  40 mg Per Tube Daily   Chlorhexidine Gluconate Cloth  6 each Topical Daily   cloNIDine  0.1 mg Oral TID   clopidogrel  75 mg Per Tube Daily   docusate  200 mg Per Tube BID   enoxaparin (LOVENOX) injection  60 mg Subcutaneous Q24H   feeding supplement (PROSource TF20)  60 mL Per Tube BID   furosemide  20 mg Intravenous Q12H   hydrALAZINE  50 mg Per Tube TID   insulin aspart  0-15 Units Subcutaneous Q4H   insulin aspart  5 Units Subcutaneous Q4H   insulin glargine-yfgn  15 Units Subcutaneous QHS   mouth rinse  15 mL Mouth Rinse Q2H   pantoprazole (PROTONIX) IV  40 mg Intravenous Q24H   polyethylene glycol  17 g Per Tube Daily   predniSONE  20 mg Per Tube Q breakfast   sodium chloride flush  3 mL Intravenous Q12H   Continuous Infusions:   sodium chloride 10 mL/hr at 07/09/22 0637   dexmedetomidine (PRECEDEX) IV infusion 0.5 mcg/kg/hr (07/09/22 0637)   feeding supplement (VITAL 1.5 CAL) 55 mL/hr at 07/09/22 0637   propofol (DIPRIVAN) infusion Stopped (07/06/22 1202)   PRN Meds:.sodium chloride, acetaminophen (TYLENOL) oral liquid 160 mg/5 mL **OR** acetaminophen, fentaNYL (SUBLIMAZE) injection, ipratropium-albuterol, labetalol, midazolam, ondansetron **OR** ondansetron (ZOFRAN) IV, mouth rinse, sodium chloride flush    Interim History / Subjective:  No problems overnight, now off fent infusion anticipating wean to extubate to bipap if needed   Objective   Blood pressure 121/81, pulse 63, temperature 97.7 F (36.5 C), temperature source Oral, resp. rate 16, height 5\' 11"  (1.803 m), weight 113.2 kg, SpO2 100 %.    Vent Mode: PRVC FiO2 (%):  [40 %] 40 % Set Rate:  [16 bmp] 16 bmp Vt Set:  [600 mL] 600 mL PEEP:  [5 cmH20] 5 cmH20 Pressure Support:  [10 CHE52-77 cmH20] 12 cmH20 Plateau Pressure:  [18 cmH20-23 cmH20] 23 cmH20   Intake/Output Summary (Last 24 hours) at 07/09/2022 0902 Last data filed at 07/09/2022 0839 Gross per 24 hour  Intake 2433.27 ml  Output 4950 ml  Net -2516.73 ml   Filed Weights   07/07/22 0427 07/08/22 0500 07/09/22 0452  Weight: 117.5 kg 113.1 kg 113.2 kg    Examination: Tmax:  98.6 General appearance:    more chronically than  acutely ill  appearing at this point    No jvd Oropharynx ET  Neck supple Lungs with a few scattered exp > insp rhonchi bilaterally RRR no s3 or or sign murmur Abd obese with limted  excursion  Extr warm with no edema or clubbing noted Neuro  Sensorium appears more alert,  no apparent motor deficits    I personally reviewed images and agree with radiology impression as follows:  CXR:   portable 12/4  Probable congestive heart failure.  Difficult to exclude pneumonia.     Resolved Hospital Problem list     Assessment & Plan:   Acute hypoxic/hypercapnic  respiratory failure from Influenza pneumonia with possible bacterial superinfection and acute pulmonary edema. -  off abx 12/2  -  goal SpO2 > 92% -  prednisone 20 mg daily   - prn BDs - added clonidine this am to help with wean off narcs/precedex and keep bp down without additional diurectics given tenuous renal function >>>plan to extubate today to bipap if possible   Acute on chronic HFrEF >> improved EF on Echo from 07/03/22. Elevated troponin from demand ischemia. Hx of HTN, HLD. DM type 2 poorly controlled with steroid induced hyperglycemia. Anemia of critical illness and chronic disease. AKI from overdiuresis; has tolerated lower dose of diuretics. Hyperkalemia. CKD 3b. - per primary team  Best Practice (right click and "Reselect all SmartList Selections" daily)   Diet/type: tubefeeds DVT prophylaxis: LMWH GI prophylaxis: PPI Lines: N/A Foley:  N/A Code Status:  full code Last date of multidisciplinary goals of care discussion [x]   Labs       Latest Ref Rng & Units 07/09/2022    4:05 AM 07/08/2022    3:24 AM 07/07/2022    3:40 AM  CMP  Glucose 70 - 99 mg/dL 250  194  224   BUN 6 - 20 mg/dL 96  92  89   Creatinine 0.61 - 1.24 mg/dL 2.43  2.66  2.68   Sodium 135 - 145 mmol/L 145  145  142   Potassium 3.5 - 5.1 mmol/L 4.5  4.5  4.7   Chloride 98 - 111 mmol/L 113  113  110   CO2 22 - 32 mmol/L 23  23  21    Calcium 8.9 - 10.3 mg/dL 8.9  8.6  8.6   Total Protein 6.5 - 8.1 g/dL  6.6    Total Bilirubin 0.3 - 1.2 mg/dL  0.5    Alkaline Phos 38 - 126 U/L  55    AST 15 - 41 U/L  13    ALT 0 - 44 U/L  14         Latest Ref Rng & Units 07/09/2022    4:05 AM 07/08/2022    3:24 AM 07/07/2022    3:40 AM  CBC  WBC 4.0 - 10.5 K/uL 5.7  5.6  5.6   Hemoglobin 13.0 - 17.0 g/dL 8.6  8.1  8.2   Hematocrit 39.0 - 52.0 % 27.4  26.2  26.3   Platelets 150 - 400 K/uL 242  239  208     ABG    Component Value Date/Time   PHART 7.53 (H) 07/04/2022 0448   PCO2ART 26 (L) 07/04/2022  0448   PO2ART 73 (L) 07/04/2022 0448   HCO3 22.0 07/04/2022 0448   TCO2 27 11/08/2021 0728   ACIDBASEDEF 6.2 (H) 07/01/2022 2005   O2SAT 97.3 07/04/2022 0448    CBG (last 3)  Recent Labs  07/09/22 0004 07/09/22 0419 07/09/22 0720  GLUCAP 232* 252* 255*

## 2022-07-09 NOTE — Evaluation (Signed)
Clinical/Bedside Swallow Evaluation Patient Details  Name: Oscar Rose MRN: [id number] Date of Birth: 12/28/1960  Today's Date: 07/09/2022 Time: SLP Start Time (ACUTE ONLY): 3762 SLP Stop Time (ACUTE ONLY): 8315 SLP Time Calculation (min) (ACUTE ONLY): 32 min  Past Medical History:  Past Medical History:  Diagnosis Date   Anemia    Arthritis    At risk for sleep apnea    STOP-BANG= 7    SENT TO PCP 06-29-2014   CKD (chronic kidney disease), stage II    montitored by nephrologist   Depression        Diabetic neuropathy (HCC)    GERD (gastroesophageal reflux disease)    Headache    SINUS   History of kidney stones    History of retinal detachment    Hyperlipidemia    Hypertension    Legal blindness of left eye, as defined in [place]    SECONDARY TO RETAINAL DETACHMENT   Neuropathy    PVD (peripheral vascular disease) ([place])    Restless leg syndrome    Retained ureteral stent    w/ encrustation SINCE 2012   Rotator cuff syndrome of right shoulder    Sleep apnea in adult    Stroke [facility])    ?  STROKE  JULY  2017   Type 2 diabetes mellitus (HCC)    Vitamin D deficiency    Past Surgical History:  Past Surgical History:  Procedure Laterality Date   ABDOMINAL AORTOGRAM W/LOWER EXTREMITY N/A 11/08/2021   Procedure: ABDOMINAL AORTOGRAM W/LOWER EXTREMITY;  Surgeon: Marty Heck, MD;  Location: [place] [facility];  Service: Cardiovascular;  Laterality: N/A;   AMPUTATION Bilateral 2012   Left big toe partial and right big toe complete   CARDIOVASCULAR STRESS TEST  04-05-2014  dr croitoru   low risk lexiscan nuclear study with mild diaphragmatic attenuation artifact/  no ischemia/  ef 58%   CATARACT EXTRACTION W/ INTRAOCULAR LENS  IMPLANT, BILATERAL  2013   CYSTO /  LEFT URETERAL STENT PLACEMENT  2012   CYSTOSCOPY W/ URETERAL STENT REMOVAL Left 07/07/2014   Procedure: CYSTO WITH LEFT PORTION STENT REMOVAL;  Surgeon: Malka So, MD;  Location: [facility];   Service: Urology;  Laterality: Left;   CYSTOSCOPY WITH URETEROSCOPY AND STENT PLACEMENT Left 07/07/2014   Procedure: CYSTOLITHALOPAXY URETEROSCOPY WITH STENT;  Surgeon: Malka So, MD;  Location: [facility];  Service: Urology;  Laterality: Left;   ENDARTERECTOMY Right 04/04/2016   Procedure: ENDARTERECTOMY CAROTID ARTERY RIGHT;  Surgeon: Angelia Mould, MD;  Location: [place];  Service: Vascular;  Laterality: Right;   HOLMIUM LASER APPLICATION N/A 17/01/1606   Procedure: HOLMIUM LASER APPLICATION;  Surgeon: Malka So, MD;  Location: [facility];  Service: Urology;  Laterality: N/A;   I & D  INFECTED SPIDER BITE UPPER BACK  06/ 2012   NEPHROLITHOTOMY Left 08/16/2014   Procedure: LEFT NEPHROLITHOTOMY PERCUTANEOUS;  Surgeon: Malka So, MD;  Location: [facility];  Service: Urology;  Laterality: Left;   NEPHROLITHOTOMY Left 08/18/2014   Procedure: LEFT NEPHROLITHOTOMY PERCUTANEOUS SECOND LOOK;  Surgeon: Malka So, MD;  Location: [facility];  Service: Urology;  Laterality: Left;   PATCH ANGIOPLASTY Right 04/04/2016   Procedure: PATCH ANGIOPLASTY RIGHT CAROTID ARTERY;  Surgeon: Angelia Mould, MD;  Location: [place];  Service: Vascular;  Laterality: Right;   PERIPHERAL VASCULAR BALLOON ANGIOPLASTY  11/08/2021   Procedure: PERIPHERAL VASCULAR BALLOON ANGIOPLASTY;  Surgeon: Marty Heck, MD;  Location: [place] [facility];  Service: Cardiovascular;;  Left AT   RETINAL DETACHMENT SURGERY Bilateral 2013   TONSILLECTOMY AND ADENOIDECTOMY  as child   HPI:  Oscar Rose is a 61 y.o. male with medical history significant of type 2 diabetes with nephropathy, gastroesophageal reflux disease, hypertension, hyperlipidemia, chronic kidney disease stage IIIb, chronic respiratory failure, sleep apnea, class II obesity and recent hospitalization with multifocal pneumonia; presented from his extended care facility secondary to worsening shortness of breath and hypoxia.  On  arrival to the emergency department patient condition further deteriorates and he was found to be obtunded with oxygen saturation in the mid 60s.  EMS personnel transported him providing bagging with bag-valve-mask.  Patient was intubated, mechanically ventilated and sedated. Workup in the ED demonstrating vascular congestion, pulmonary edema with concern for ARDS and multifocal pneumonia.  Patient COVID PCR was negative but he was found to be positive for influenza A. Pt extubated today. BSE ordered.    Assessment / Plan / Recommendation  Clinical Impression  Clinical swallow evaluation completed at bedside. Pt was extubated earlier today and SLP tried to see, however Pt required deep suctioning and there was consideration of back on vent. He has managed to stay off vent. MD ordered clear liquid diet. Pt currently aphonic, but mouths words and able to tell me his birth month and where he was born [facility]). He is able to cough and swallow on command, but is otherwise quite lethargic and slow to respond. He nodded his head that he was was hungry and thirsty. Pt appeared to tolerate ice chips and italian ice, however reduced labial seal on the left and delayed coughing elicited with cup and straw sips thin water. Pt consumed a couple ounces applesauce before shaking his head that he didn't want anymore. Recommend NPO overnight, but ok for ice chips for comfort and PO medications whole in puree when alert and upright and SLP will check back tomorrow AM. Above to RN and in agreement with plan of care. SLP will follow. SLP Visit Diagnosis: Dysphagia, unspecified (R13.10)    Aspiration Risk  Moderate aspiration risk;Risk for inadequate nutrition/hydration    Diet Recommendation Ice chips PRN after oral care   Medication Administration: Whole meds with puree Supervision: Staff to assist with self feeding;Full supervision/cueing for compensatory strategies Compensations: Slow rate;Small sips/bites Postural  Changes: Seated upright at 90 degrees;Remain upright for at least 30 minutes after po intake    Other  Recommendations Oral Care Recommendations: Oral care QID;Oral care prior to ice chip/H20;Staff/trained caregiver to provide oral care    Recommendations for follow up therapy are one component of a multi-disciplinary discharge planning process, led by the attending physician.  Recommendations may be updated based on patient status, additional functional criteria and insurance authorization.  Follow up Recommendations Skilled nursing-short term rehab (<3 hours/day)      Assistance Recommended at Discharge    Functional Status Assessment Patient has had a recent decline in their functional status and demonstrates the ability to make significant improvements in function in a reasonable and predictable amount of time.  Frequency and Duration min 2x/week          Prognosis Prognosis for Safe Diet Advancement: Fair Barriers to Reach Goals: Severity of deficits      Swallow Study   General Date of Onset: 06/29/22 HPI: Oscar Rose is a 61 y.o. male with medical history significant of type 2 diabetes with nephropathy, gastroesophageal reflux disease, hypertension, hyperlipidemia, chronic kidney  disease stage IIIb, chronic respiratory failure, sleep apnea, class II obesity and recent hospitalization with multifocal pneumonia; presented from his extended care facility secondary to worsening shortness of breath and hypoxia.  On arrival to the emergency department patient condition further deteriorates and he was found to be obtunded with oxygen saturation in the mid 60s.  EMS personnel transported him providing bagging with bag-valve-mask.  Patient was intubated, mechanically ventilated and sedated. Workup in the ED demonstrating vascular congestion, pulmonary edema with concern for ARDS and multifocal pneumonia.  Patient COVID PCR was negative but he was found to be positive for influenza A. Pt  extubated today. BSE ordered. Type of Study: Bedside Swallow Evaluation Previous Swallow Assessment: N/A Diet Prior to this Study:  (clear liquids) Temperature Spikes Noted: No Respiratory Status: Nasal cannula History of Recent Intubation: Yes Length of Intubations (days): 10 days Date extubated: 07/09/22 Behavior/Cognition: Alert;Cooperative;Requires cueing Oral Cavity Assessment: Within Functional Limits Oral Care Completed by SLP: Yes Oral Cavity - Dentition: Adequate natural dentition;Missing dentition Vision: Functional for self-feeding Self-Feeding Abilities: Total assist Patient Positioning: Upright in bed Baseline Vocal Quality: Aphonic;Not observed Volitional Cough: Strong;Congested Volitional Swallow: Able to elicit    Oral/Motor/Sensory Function Overall Oral Motor/Sensory Function: Generalized oral weakness   Ice Chips Ice chips: Within functional limits Presentation: Spoon   Thin Liquid Thin Liquid: Impaired Presentation: Cup;Spoon;Straw Oral Phase Impairments: Reduced labial seal Oral Phase Functional Implications: Left anterior spillage Pharyngeal  Phase Impairments: Cough - Delayed    Nectar Thick Nectar Thick Liquid: Not tested   Honey Thick Honey Thick Liquid: Not tested   Puree Puree: Within functional limits Presentation: Spoon   Solid     Solid: Not tested     Thank you,  Genene Churn, [place]  [place] 07/09/2022,7:06 PM

## 2022-07-09 NOTE — Progress Notes (Signed)
Patient weaned well on CPAP/PS 5/5 40% FIO2. Weaning parameters done, patient performed -30 NIF, and 725ml FVC.  AT 1107 HR RT extubated patient per MD. RT extubated patient to 4L O2 via nasal cannula, no complications noted. Patient remained stable throughout, HR 94, RR 17, BP 152/94 and SATs 97%. RN at bedside and RT will continue to monitor and assess.

## 2022-07-09 NOTE — Progress Notes (Signed)
Progress Note   [name] YIR:[id number] DOB: 04/27/1961 DOA: 06/29/2022     10 DOS: the patient was seen and examined on 07/09/2022   Brief hospital course: Oscar Rose is a 61 y.o. male with medical history significant of type 2 diabetes with nephropathy, gastroesophageal reflux disease, hypertension, hyperlipidemia, chronic kidney disease stage IIIb, chronic respiratory failure, sleep apnea, class II obesity and recent hospitalization with multifocal pneumonia; presented from his extended care facility secondary to worsening shortness of breath and hypoxia.  On arrival to the emergency department patient condition further deteriorates and he was found to be obtunded with oxygen saturation in the mid 60s.  EMS personnel transported him providing bagging with bag-valve-mask.  Patient was intubated, mechanically ventilated and sedated.  At time of my evaluation unable to answer any questions or provide any history. Per EDP notes, EMS reports and information from his facility no chest pain, no nausea, no vomiting, no dysuria, no melena, no hematochezia, no focal deficits or any other complaints.   Workup in the ED demonstrating vascular congestion, pulmonary edema with concern for ARDS and multifocal pneumonia.  Patient COVID PCR was negative but he was found to be positive for influenza A.  TRH has been consulted to place patient in the hospital for further evaluation and management.  07/03/22  -Dr. Halford Rose recommends transfer to [facility] ICU with pulmonary critical care service managing patient/primary team -Patient with significant increased oxygen requirement, more difficult to ventilate over the past couple of days and demonstrating worsening renal function with marginal output while receiving diuresis.   -Patient is currently awaiting ICU bed at [facility] for further critical care management. --on 07/04/22 Failed sedation vacation,  failed weaning trial  07/05/22   -Patient did poorly with sedation vacation and weaning trial-- Patient weaned on 10/5 with 40% from 1322 to 1455. At 1455 desaturation to 86%,1500 BP 221/82 patient was returned to rest mode of PRVC 26/ 600/ 80%/ PEEP 5. He was unable to maintain an adequate SpO2 on 40% at that time.,  FiO2 was increased to 80% temporarily  07/06/22 -Switched from IV propofol to Precedex--did better with weaning trial (almost 3 hrs) on Precedex  07/07/22 -Did not do well with pressure support/weaning trial--- lasted less than 2 hours -Back on vent -ET tube advanced  -07/09/22 -Intubated 06/29/2022 -Extubated 07/09/2022... Currently on nasal cannula at 5 L -May use BiPAP as needed and nightly  Assessment and Plan: 1)Acute respiratory failure with hypoxia/influenza A and HCAP -Acute respiratory failure with hypoxia secondary to pulmonary edema/acute on chronic systolic heart failure, influenza  A and multifocal pneumonia. -Remains intubated and ventilated-- - completed Tamiflu as ordered (5 day course started on 06/29/2022) Completed vanco,cefepime/Zosyn and azithro---last dose 07/06/2022 for HCAP protocol -Treated with Solu-Medrol, transitioned to prednisone -Continue diuresis with IV Lasix -Continue bronchodilators -Pulmonary critical care consult from Dr. Halford Rose and Dr Oscar Rose appreciated -07/09/22 -Intubated 06/29/2022 -Extubated 07/09/2022... Currently on nasal cannula at 5 L -May use BiPAP as needed and nightly  2)HCAP (healthcare-associated pneumonia) --Management as above #1 Blood cultures from 06/29/2022 with PROPIONIBACTERIUM ACNES  Suspect this is a contaminant-- -completed antibiotics as above #1  3)AKI----acute kidney injury on CKD stage -IV -Creatinine was 2.2 on 11/08/2021, creatinine was 2.8 on 05/15/2022 -Creatinine was 2.55 on admission on 06/29/2022 -Creatinine continues to improve after peaking at 3.66 -renally adjust medications, avoid nephrotoxic agents / dehydration  /  hypotension  4) hyperkalemia--- in the setting of AKI on CKD 4 as  above #3 -Resolved with interventions  5) acute on chronic anemia--Hgb is down to 8.6 from 10.2 on admission -Baseline hemoglobin usually around 9 -No obvious bleeding at this time continue to monitor  6)ARDS (adult respiratory distress syndrome) (HCC) -Continue ARDS protocol and his ventilatory settings - PCCM recommendations by Dr. Deland Rose appreciated -Please see #1 above  7)Elevated Troponin -Appears to be demand ischemia in the setting of hypoxic respiratory failure -No ischemic changes appreciated on EKG and telementry -Echo from 05/13/2022 with EF of 55 to 60%, still showed wall motion abnormalities, with hypokinesis of the left ventricular, apical anterior inferior wall and lateral as well as inferior lateral and apical segments -No mitral stenosis or aortic stenosis -Continue treatment with Lipitor, aspirin and Plavix  8)Influenza A with pneumonia Completed Tamiflu as above #1 -Further management as above #1  9)Acute on chronic systolic CHF (congestive heart failure) (HCC) -Elevated BNP --Continue IV diuresis -Echo as above #7  10)Type 2 diabetes mellitus ([place]) -With nephropathy; chronic kidney disease a stage IV -A1c 5.5 reflecting excellent diabetic control PTA -Suspect steroid-induced hyperglycemia 07/09/22 - insulin regimen--Lantus and NovoLog adjusted none the patient is off tube feeding Use Novolog/Humalog Sliding scale insulin with Accu-Cheks/Fingersticks as ordered   11)Hypertension -continue amlodipine, hydralazine,  -may use IV labetalol as needed elevated BP - 12)FEN--tube feeding stopped after extubation on 07/09/2022 - 13)Social/Ethics---no living Blood  relatives here in [place] (his blood relatives live [place]),  Oscar Rose-- [phone]---is his Primary contact (she is the sister of Oscar Rose's ex Oscar Rose who died in 10/29/15) - -Prophylaxis Continue Protonix for GI  prophylaxis -Continue Lovenox for DVT prophylaxis  CRITICAL CARE Performed by: Oscar Rose  Total critical care time: 56 minutes  Critical care time was exclusive of separately billable procedures and treating other patients.  Critical care was necessary to treat or prevent imminent or life-threatening deterioration. - Extubated 07/09/2022---currently on nasal cannula at 5 L-  Critical care was time spent personally by me on the following activities: development of treatment plan with patient and/or surrogate as well as nursing, discussions with consultants, evaluation of patient's response to treatment, examination of patient, obtaining history from patient or surrogate, ordering and performing treatments and interventions, ordering and review of laboratory studies, ordering and review of radiographic studies, pulse oximetry and re-evaluation of patient's condition.  Subjective:   07/09/22 -Extubated to nasal cannula at 5L/min -I called and updated next of kin--Oscar Rose-- [phone]--- -- No fevers  Physical Exam: Vitals:   07/09/22 1200 07/09/22 1342 07/09/22 1400 07/09/22 1500  BP:   (!) 147/83 (!) 163/81  Pulse:   (!) 117 (!) 121  Resp:   19 20  Temp: 97.6 F (36.4 C)     TempSrc: Axillary     SpO2:  95% 98% 98%  Weight:      Height:    (P) 5\' 11"  (1.803 m)   Physical Exam Gen:-Appears comfortable, extubated  HEENT:- [place].AT, No sclera icterus-extubated 07/09/2022 Lungs-, slightly diminished air movement , sounds somewhat congested, no increased work of breathing CV- S1, S2 normal, irregular, somewhat tachycardic Abd-  +ve B.Sounds, Abd Soft, ND Extremity/Skin:- +ve  edema,   good pedal pulses  NeuroPsych-affect is flat appears to follow commands -MSK-prior right big toe amputation, prior left transmetatarsal amputation GU-Foley with clear urine Rectal Tube in situ  Family Communication: Updated Oscar Rose-- [phone]---she is his Primary contact  (she is the sister of Mr. Mcnulty's ex Oscar Rose who died in 2015/10/29)  Disposition: -Possibly discharge back to [facility] when medically stable patient/primary team Status is: Inpatient Remains inpatient appropriate because: Continue treatment for ARDS, multifocal pneumonia and pulmonary edema due to acute on chronic systolic heart failure.   Planned Discharge Destination: Skilled nursing facility... Currently Not stable for discharge   Author: Roxan Hockey, MD 07/09/2022 4:26 PM  For on call review [email or web address].

## 2022-07-10 ENCOUNTER — Inpatient Hospital Stay (HOSPITAL_COMMUNITY): Payer: Medicare Other

## 2022-07-10 DIAGNOSIS — J09X1 Influenza due to identified novel influenza A virus with pneumonia: Secondary | ICD-10-CM | POA: Diagnosis not present

## 2022-07-10 DIAGNOSIS — I5023 Acute on chronic systolic (congestive) heart failure: Secondary | ICD-10-CM | POA: Diagnosis not present

## 2022-07-10 DIAGNOSIS — J9601 Acute respiratory failure with hypoxia: Secondary | ICD-10-CM | POA: Diagnosis not present

## 2022-07-10 LAB — CBC
HCT: 29.2 % — ABNORMAL LOW (ref 39.0–52.0)
Hemoglobin: 9 g/dL — ABNORMAL LOW (ref 13.0–17.0)
MCH: 28.3 pg (ref 26.0–34.0)
MCHC: 30.8 g/dL (ref 30.0–36.0)
MCV: 91.8 fL (ref 80.0–100.0)
Platelets: 309 10*3/uL (ref 150–400)
RBC: 3.18 MIL/uL — ABNORMAL LOW (ref 4.22–5.81)
RDW: 13.8 % (ref 11.5–15.5)
WBC: 9.3 10*3/uL (ref 4.0–10.5)
nRBC: 0 % (ref 0.0–0.2)

## 2022-07-10 LAB — GLUCOSE, CAPILLARY
Glucose-Capillary: 137 mg/dL — ABNORMAL HIGH (ref 70–99)
Glucose-Capillary: 154 mg/dL — ABNORMAL HIGH (ref 70–99)
Glucose-Capillary: 170 mg/dL — ABNORMAL HIGH (ref 70–99)
Glucose-Capillary: 170 mg/dL — ABNORMAL HIGH (ref 70–99)
Glucose-Capillary: 188 mg/dL — ABNORMAL HIGH (ref 70–99)
Glucose-Capillary: 190 mg/dL — ABNORMAL HIGH (ref 70–99)
Glucose-Capillary: 191 mg/dL — ABNORMAL HIGH (ref 70–99)
Glucose-Capillary: 204 mg/dL — ABNORMAL HIGH (ref 70–99)

## 2022-07-10 LAB — COMPREHENSIVE METABOLIC PANEL
ALT: 14 U/L (ref 0–44)
AST: 17 U/L (ref 15–41)
Albumin: 2.9 g/dL — ABNORMAL LOW (ref 3.5–5.0)
Alkaline Phosphatase: 64 U/L (ref 38–126)
Anion gap: 11 (ref 5–15)
BUN: 97 mg/dL — ABNORMAL HIGH (ref 6–20)
CO2: 25 mmol/L (ref 22–32)
Calcium: 9.2 mg/dL (ref 8.9–10.3)
Chloride: 111 mmol/L (ref 98–111)
Creatinine, Ser: 2.42 mg/dL — ABNORMAL HIGH (ref 0.61–1.24)
GFR, Estimated: 30 mL/min — ABNORMAL LOW (ref >60)
Glucose, Bld: 164 mg/dL — ABNORMAL HIGH (ref 70–99)
Potassium: 4.3 mmol/L (ref 3.5–5.1)
Sodium: 147 mmol/L — ABNORMAL HIGH (ref 135–145)
Total Bilirubin: 0.4 mg/dL (ref 0.3–1.2)
Total Protein: 7 g/dL (ref 6.5–8.1)

## 2022-07-10 LAB — PROCALCITONIN: Procalcitonin: 0.16 ng/mL

## 2022-07-10 MED ORDER — IPRATROPIUM-ALBUTEROL 0.5-2.5 (3) MG/3ML IN SOLN
3.0000 mL | Freq: Two times a day (BID) | RESPIRATORY_TRACT | Status: DC
  Administered 2022-07-10 – 2022-07-13 (×8): 3 mL via RESPIRATORY_TRACT
  Filled 2022-07-10 (×10): qty 3

## 2022-07-10 MED ORDER — PREDNISONE 10 MG PO TABS
10.0000 mg | ORAL_TABLET | Freq: Every day | ORAL | Status: DC
  Administered 2022-07-11 – 2022-07-13 (×3): 10 mg via ORAL
  Filled 2022-07-10 (×4): qty 1

## 2022-07-10 MED ORDER — PANTOPRAZOLE SODIUM 40 MG PO TBEC
40.0000 mg | DELAYED_RELEASE_TABLET | Freq: Every day | ORAL | Status: DC
  Administered 2022-07-11 – 2022-07-13 (×3): 40 mg via ORAL
  Filled 2022-07-10 (×6): qty 1

## 2022-07-10 MED ORDER — MINOXIDIL 2.5 MG PO TABS
5.0000 mg | ORAL_TABLET | Freq: Two times a day (BID) | ORAL | Status: DC
  Administered 2022-07-10 – 2022-07-13 (×7): 5 mg via ORAL
  Filled 2022-07-10 (×15): qty 2

## 2022-07-10 NOTE — Plan of Care (Signed)
  Problem: Acute Rehab PT Goals(only PT should resolve) Goal: Pt Will Go Supine/Side To Sit Outcome: Progressing Flowsheets (Taken 07/10/2022 1049) Pt will go Supine/Side to Sit: with moderate assist Goal: Patient Will Perform Sitting Balance Outcome: Progressing Flowsheets (Taken 07/10/2022 1049) Patient will perform sitting balance: with minimal assist Goal: Patient Will Transfer Sit To/From Stand Outcome: Progressing Flowsheets (Taken 07/10/2022 1049) Patient will transfer sit to/from stand: with moderate assist Goal: Pt Will Transfer Bed To Chair/Chair To Bed Outcome: Progressing Flowsheets (Taken 07/10/2022 1049) Pt will Transfer Bed to Chair/Chair to Bed: with mod assist Goal: Pt Will Ambulate Outcome: Progressing Flowsheets (Taken 07/10/2022 1049) Pt will Ambulate:  with moderate assist  with rolling walker  10 feet   Zigmund Gottron, SPT

## 2022-07-10 NOTE — Progress Notes (Signed)
Progress Note   [name] ZLD:[id number] DOB: 08-23-1960 DOA: 06/29/2022     11 DOS: the patient was seen and examined on 07/10/2022    07/09/22 The patient was seen and examined this morning, down to 3 L of oxygen, satting 91%, no acute distress Lethargic but follows commands  -Next of kin--Ms April Brame-- [phone] updated 12/06    Brief hospital course: ADON GEHLHAUSEN is a 61 y.o. male with medical history significant of type 2 diabetes with nephropathy, gastroesophageal reflux disease, hypertension, hyperlipidemia, chronic kidney disease stage IIIb, chronic respiratory failure, sleep apnea, class II obesity and recent hospitalization with multifocal pneumonia; presented from his extended care facility secondary to worsening shortness of breath and hypoxia.  On arrival to the emergency department patient condition further deteriorates and he was found to be obtunded with oxygen saturation in the mid 60s.  EMS personnel transported him providing bagging with bag-valve-mask.  Patient was intubated, mechanically ventilated and sedated.  At time of my evaluation unable to answer any questions or provide any history. Per EDP notes, EMS reports and information from his facility no chest pain, no nausea, no vomiting, no dysuria, no melena, no hematochezia, no focal deficits or any other complaints.   Workup in the ED demonstrating vascular congestion, pulmonary edema with concern for ARDS and multifocal pneumonia.  Patient COVID PCR was negative but he was found to be positive for influenza A.  TRH has been consulted to place patient in the hospital for further evaluation and management.  07/03/22  -Dr. Halford Chessman recommends transfer to [facility] ICU with pulmonary critical care service managing patient/primary team -Patient with significant increased oxygen requirement, more difficult to ventilate over the past couple of days and demonstrating worsening renal function with marginal  output while receiving diuresis.   -Patient is currently awaiting ICU bed at [facility] for further critical care management. - 07/04/22 Failed sedation vacation,  failed weaning trial  07/05/22  -Patient did poorly with sedation vacation and weaning trial-- Patient weaned on 10/5 with 40% from 1322 to 1455. At 1455 desaturation to 86%,1500 BP 221/82 patient was returned to rest mode of PRVC 26/ 600/ 80%/ PEEP 5. He was unable to maintain an adequate SpO2 on 40% at that time.,  FiO2 was increased to 80% temporarily  07/06/22 -Switched from IV propofol to Precedex--did better with weaning trial (almost 3 hrs) on Precedex  07/07/22 -Did not do well with pressure support/weaning trial--- lasted less than 2 hours -Back on vent -ET tube advanced  -07/09/22 -Intubated 06/29/2022 -Extubated 07/09/2022... Currently on nasal cannula at 5 L -May use BiPAP as needed and nightly  07/10/2322 -The patient was seen and examined, remains extubated, on 3 L of oxygen satting 99% -Still lethargic, but follows commands  ---------------------------------------------------------------------------------------------------------------------------------------------  Assessment and Plan:  1)Acute respiratory failure with hypoxia/influenza A and HCAP -Much improved, taper down to 3 L of oxygen, this morning satting 99%   -Acute respiratory failure with hypoxia secondary to pulmonary edema/acute on chronic systolic heart failure, influenza  A and multifocal pneumonia. -Remains intubated and ventilated-- - completed Tamiflu as ordered (5 day course started on 06/29/2022) Completed vanco,cefepime/Zosyn and azithro---last dose 07/06/2022 for HCAP protocol -Treated with Solu-Medrol, transitioned to prednisone -Continue diuresis with IV Lasix -Continue bronchodilators -Pulmonary critical care consult from Dr. Halford Chessman and Dr Melvyn Novas appreciated -07/09/22 -Intubated 06/29/2022 -Extubated 07/09/2022... Currently on  nasal cannula at 5 L -May use BiPAP as needed and nightly  2)HCAP (healthcare-associated pneumonia) --Management  as above #1 Blood cultures from 06/29/2022 with PROPIONIBACTERIUM ACNES  Suspect this is a contaminant-- -completed antibiotics as above #1  3)AKI----acute kidney injury on CKD stage -IV -Creatinine was 2.2 on 11/08/2021, creatinine was 2.8 on 05/15/2022 -Creatinine was 2.55 on admission on 06/29/2022 Lab Results  Component Value Date   CREATININE 2.42 (H) 07/10/2022   CREATININE 2.43 (H) 07/09/2022   CREATININE 2.66 (H) 07/08/2022   -Creatinine continues to improve after peaking at 3.66    -renally adjust medications, avoid nephrotoxic agents / dehydration  / hypotension  4) hyperkalemia--- in the setting of AKI on CKD 4 as above #3 -Resolved with interventions  5) acute on chronic anemia--Hgb is down to 8.6 from 10.2 on admission -Baseline hemoglobin usually around 9 -No obvious bleeding at this time continue to monitor  6)ARDS (adult respiratory distress syndrome) (HCC) -Continue ARDS protocol and his ventilatory settings - PCCM recommendations by Dr. Deland Pretty appreciated -Please see #1 above  7)Elevated Troponin -Appears to be demand ischemia in the setting of hypoxic respiratory failure -No ischemic changes appreciated on EKG and telementry -Echo from 05/13/2022 with EF of 55 to 60%, still showed wall motion abnormalities, with hypokinesis of the left ventricular, apical anterior inferior wall and lateral as well as inferior lateral and apical segments -No mitral stenosis or aortic stenosis -Continue treatment with Lipitor, aspirin and Plavix  8)Influenza A with pneumonia Completed Tamiflu as above #1 -Further management as above #1  9)Acute on chronic systolic CHF (congestive heart failure) (HCC) -Elevated BNP --Continue IV diuresis -Echo as above #7  10)Type 2 diabetes mellitus ([place]) -With nephropathy; chronic kidney disease a stage IV -A1c 5.5  reflecting excellent diabetic control PTA -Suspect steroid-induced hyperglycemia 07/09/22 - insulin regimen--Lantus and NovoLog adjusted none the patient is off tube feeding Use Novolog/Humalog Sliding scale insulin with Accu-Cheks/Fingersticks as ordered   11)Hypertension -continue amlodipine, hydralazine,  -may use IV labetalol as needed elevated BP - 12)FEN--tube feeding stopped after extubation on 07/09/2022 - 13)Social/Ethics---no living Blood  relatives here in [place] (his blood relatives live [place]),  Ms April Brame-- [phone]---is his Primary contact (she is the sister of Mr. Goree's ex Fiancee who died in 11-06-2015) - -Prophylaxis Continue Protonix for GI prophylaxis -Continue Lovenox for DVT prophylaxis     Physical Exam: Vitals:   07/10/22 1000 07/10/22 1100 07/10/22 1150 07/10/22 1200  BP: (!) 158/62     Pulse: 91 (!) 101 (!) 103 (!) 105  Resp: 18 18 17 16   Temp:   98.9 F (37.2 C)   TempSrc:   Oral   SpO2: 97% 99% 98% 99%  Weight:      Height:          Physical Exam:   General:  AAO x 1, lethargic, cooperative,  HEENT:  Normocephalic, PERRL, otherwise with in Normal limits   Neuro:  CNII-XII intact. , normal motor and sensation, reflexes intact   Lungs:   Clear to auscultation BL, Respirations unlabored,  No wheezes / crackles  Cardio:    S1/S2, RRR, No murmure, No Rubs or Gallops   Abdomen:  Soft, non-tender, bowel sounds active all four quadrants, no guarding or peritoneal signs.  Muscular  skeletal:  Limited exam -sever global generalized weaknesses - in bed, able to move all 4 extremities,   2+ pulses,  symmetric, No pitting edema  Skin:  Dry, warm to touch, negative for any Rashes,  Wounds: Please see nursing documentation  Pressure Injury 07/05/22 Coccyx Medial Stage 2 -  Partial thickness loss of dermis presenting as a shallow open injury with a red, pink wound bed without slough. (Active)  07/05/22 1130  Location: Coccyx  Location  Orientation: Medial  Staging: Stage 2 -  Partial thickness loss of dermis presenting as a shallow open injury with a red, pink wound bed without slough.  Wound Description (Comments):   Present on Admission: No  Dressing Type Foam - Lift dressing to assess site every shift 07/10/22 0800         -MSK-prior right big toe amputation, prior left transmetatarsal amputation GU-Foley with clear urine Rectal Tube in situ  Family Communication: Updated Ms April Brame-- [phone]---she is his Primary contact (she is the sister of Mr. Samaan's ex Celesta Gentile who died in Nov 10, 2015)  Disposition: -Possibly discharge back to [facility] when medically stable patient/primary team Status is: Inpatient Remains inpatient appropriate because: Continue treatment for ARDS, multifocal pneumonia and pulmonary edema due to acute on chronic systolic heart failure.   Planned Discharge Destination: Skilled nursing facility... Currently Not stable for discharge   Author: Deatra James, MD 07/10/2022 1:08 PM  For on call review [email or web address].    CRITICAL CARE Performed by: Deatra James  Total critical care time: 56 minutes Critical care time was exclusive of separately billable procedures and treating other patients. Critical care was necessary to treat or prevent imminent or life-threatening deterioration. - Extubated 07/09/2022---currently on nasal cannula at 5 L >>> 3 L now   Critical care was time spent personally by me on the following activities: development of treatment plan with patient and/or surrogate as well as nursing, discussions with consultants, evaluation of patient's response to treatment, examination of patient, obtaining history from patient or surrogate, ordering and performing treatments and interventions, ordering and review of laboratory studies, ordering and review of radiographic studies, pulse oximetry and re-evaluation of patient's condition.

## 2022-07-10 NOTE — Progress Notes (Signed)
Speech Language Pathology Treatment: Dysphagia  Patient Details Name: Oscar Rose MRN: [id number] DOB: 1961-06-06 Today's Date: 07/10/2022 Time: 3428-7681 SLP Time Calculation (min) (ACUTE ONLY): 22 min  Assessment / Plan / Recommendation Clinical Impression  Pt seen for ongoing dysphagia intervention following BSE completed yesterday s/p extubation after 10 day intubation. Pt still unable to voice, but is able to cough upon request. Pt continues to present with global weakness, poor trunk/head support, and poor breath support. He is able to whisper single words and requested jello. Pt assessed with ice chips, thin water, NTL, HTL, and puree. He continues to cough intermittently with thin liquids, suspect due to recent intubation. Improved performance noted with puree and honey-thick liquids and will change diet to that with 100% feeder assist, PO medications whole or crushed in puree when Pt is alert and upright. Pt will need ongoing dysphagia intervention for diet upgrades. Hope to see improvement with voicing/airway protection with time.    HPI HPI: Oscar Rose is a 61 y.o. male with medical history significant of type 2 diabetes with nephropathy, gastroesophageal reflux disease, hypertension, hyperlipidemia, chronic kidney disease stage IIIb, chronic respiratory failure, sleep apnea, class II obesity and recent hospitalization with multifocal pneumonia; presented from his extended care facility secondary to worsening shortness of breath and hypoxia.  On arrival to the emergency department patient condition further deteriorates and he was found to be obtunded with oxygen saturation in the mid 60s.  EMS personnel transported him providing bagging with bag-valve-mask.  Patient was intubated, mechanically ventilated and sedated. Workup in the ED demonstrating vascular congestion, pulmonary edema with concern for ARDS and multifocal pneumonia.  Patient COVID PCR was negative but he was found to be  positive for influenza A. Pt extubated today. BSE ordered.      SLP Plan  Continue with current plan of care      Recommendations for follow up therapy are one component of a multi-disciplinary discharge planning process, led by the attending physician.  Recommendations may be updated based on patient status, additional functional criteria and insurance authorization.    Recommendations  Diet recommendations: Dysphagia 1 (puree);Honey-thick liquid Liquids provided via: Cup Medication Administration: Whole meds with puree Supervision: Staff to assist with self feeding;Full supervision/cueing for compensatory strategies Compensations: Slow rate;Small sips/bites Postural Changes and/or Swallow Maneuvers: Seated upright 90 degrees;Upright 30-60 min after meal                Oral Care Recommendations: Oral care QID;Oral care prior to ice chip/H20;Staff/trained caregiver to provide oral care Follow Up Recommendations: Skilled nursing-short term rehab (<3 hours/day) Assistance recommended at discharge: Frequent or constant Supervision/Assistance SLP Visit Diagnosis: Dysphagia, unspecified (R13.10) Plan: Continue with current plan of care        Thank you,  Genene Churn, [place]    [place]  07/10/2022, 11:15 AM

## 2022-07-10 NOTE — Progress Notes (Signed)
NAME:  Oscar Rose, MRN:  [id number], DOB:  1960-08-12, LOS: 31 ADMISSION DATE:  06/29/2022, CONSULTATION DATE:  07/01/2022 REFERRING MD:  Dr. Dyann Kief, [place], CHIEF COMPLAINT:  Respiratory failure   History of Present Illness:  61 yo male resident of [facility] was found unresponsive with SpO2 8%.  In ER he was diaphoretic with paradoxical breathing.  He required intubation.  He was found to have pneumonia from Influenza A and acute pulmonary edema.  He was previously  in hospital from 05/12/22 to 05/18/22 with multifocal pneumonia.    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, RLS, CVA 2017, Vit D deficiency, HFrEF  Significant Hospital Events: Including procedures, antibiotic start and stop dates in addition to other pertinent events   11/25 Admit, intubated> extubated 12/5 with very marginal cough effort 11/27 off pressors 11/28 worsening acidosis, increased O2 needs, request transfer to [facility] 11/29 completed tamiflu 11/30 start pressure support weaning> extubated 12/5  to NP  ST 12/6 rec D1 diet     Scheduled Meds:  amLODipine  10 mg Oral Daily   aspirin  81 mg Oral Daily   atorvastatin  40 mg Oral Daily   atropine  2 drop Sublingual QID   Chlorhexidine Gluconate Cloth  6 each Topical Daily   cloNIDine  0.1 mg Oral TID   clopidogrel  75 mg Oral Daily   docusate  200 mg Oral BID   enoxaparin (LOVENOX) injection  60 mg Subcutaneous Q24H   furosemide  20 mg Intravenous Q12H   hydrALAZINE  50 mg Oral TID   insulin aspart  0-15 Units Subcutaneous Q4H   insulin glargine-yfgn  8 Units Subcutaneous QHS   ipratropium-albuterol  3 mL Nebulization BID   pantoprazole (PROTONIX) IV  40 mg Intravenous Q24H   polyethylene glycol  17 g Per Tube Daily   predniSONE  20 mg Oral Q breakfast   sodium chloride flush  3 mL Intravenous Q12H   sodium chloride  HYPERTONIC  4 mL Nebulization BID   Continuous Infusions:  sodium chloride 10 mL/hr at 07/10/22 1201   dexmedetomidine (PRECEDEX) IV infusion Stopped (07/09/22 1028)   propofol (DIPRIVAN) infusion Stopped (07/06/22 1202)   PRN Meds:.sodium chloride, acetaminophen (TYLENOL) oral liquid 160 mg/5 mL **OR** acetaminophen, fentaNYL (SUBLIMAZE) injection, ipratropium-albuterol, labetalol, midazolam, ondansetron **OR** ondansetron (ZOFRAN) IV, mouth rinse, sodium chloride flush    Interim History / Subjective:  Very marginal overnight p ext in am 12/6 but only needing 3lpm this am with no increased wob   Objective   Blood pressure (!) 164/61, pulse (!) 103, temperature 98.9 F (37.2 C), temperature source Oral, resp. rate 17, height (P) 5\' 11"  (1.803 m), weight 108.1 kg, SpO2 98 %.        Intake/Output Summary (Last 24 hours) at 07/10/2022 1222 Last data filed at 07/10/2022 1201 Gross per 24 hour  Intake 552.87 ml  Output 4250 ml  Net -3697.13 ml   Filed Weights   07/08/22 0500 07/09/22 0452 07/10/22 0500  Weight: 113.1 kg 113.2 kg 108.1 kg    Examination: Tmax:  98.9 General appearance:    more chronically than acutely ill appearing   At Rest 02 sats  98% on 3lpm   No jvd Oropharynx clear,  mucosa nl Neck supple Lungs with a few scattered exp > insp rhonchi bilaterally/ still very weak cough effort  RRR no s3 or  or sign murmur Abd obese with limited excursion  Extr warm with no edema or clubbing noted Neuro  Sensorium very slow to respond/ follow commands ,  no apparent motor deficits / unable to sit on side of bed per nursing due to weakness/ balance  I personally reviewed images and agree with radiology impression as follows:  CXR:   portable 12/6 am Central venous congestion and bilateral pleural effusions. No interval change.    Resolved Hospital Problem list     Assessment & Plan:   Acute hypoxic/hypercapnic respiratory failure from Influenza pneumonia with  possible bacterial superinfection and acute pulmonary edema. -  off abx 12/2  -  goal SpO2 > 92% -  prednisone  reduce to 10 mg daily 12/7 ordered   - prn BDs - added clonidine  12/5  to help with  keep bp down without additional diurectics given tenuous renal function    Acute on chronic HFrEF >> improved EF on Echo from 07/03/22. Elevated troponin from demand ischemia. Hx of HTN, HLD. DM type 2 poorly controlled with steroid induced hyperglycemia. Anemia of critical illness and chronic disease. AKI from overdiuresis; has tolerated lower dose of diuretics. Hyperkalemia resolved CKD 3b. Lab Results  Component Value Date   CREATININE 2.42 (H) 07/10/2022   CREATININE 2.43 (H) 07/09/2022   CREATININE 2.66 (H) 07/08/2022    - per primary team >>>   minoxidil instead of hydralazine and norvasc which are redundant - usual starting dose is 5 mg bid   Best Practice (right click and "Reselect all SmartList Selections" daily)   Diet/type: tubefeeds DVT prophylaxis: LMWH GI prophylaxis: PPI Lines: N/A Foley:  N/A Code Status:  full code Last date of multidisciplinary goals of care discussion [x]   Labs       Latest Ref Rng & Units 07/10/2022    3:51 AM 07/09/2022    4:05 AM 07/08/2022    3:24 AM  CMP  Glucose 70 - 99 mg/dL 164  250  194   BUN 6 - 20 mg/dL 97  96  92   Creatinine 0.61 - 1.24 mg/dL 2.42  2.43  2.66   Sodium 135 - 145 mmol/L 147  145  145   Potassium 3.5 - 5.1 mmol/L 4.3  4.5  4.5   Chloride 98 - 111 mmol/L 111  113  113   CO2 22 - 32 mmol/L 25  23  23    Calcium 8.9 - 10.3 mg/dL 9.2  8.9  8.6   Total Protein 6.5 - 8.1 g/dL 7.0   6.6   Total Bilirubin 0.3 - 1.2 mg/dL 0.4   0.5   Alkaline Phos 38 - 126 U/L 64   55   AST 15 - 41 U/L 17   13   ALT 0 - 44 U/L 14   14        Latest Ref Rng & Units 07/10/2022    3:51 AM 07/09/2022    4:05 AM 07/08/2022    3:24 AM  CBC  WBC 4.0 - 10.5 K/uL 9.3  5.7  5.6   Hemoglobin 13.0 - 17.0 g/dL 9.0  8.6  8.1   Hematocrit  39.0 - 52.0 % 29.2  27.4  26.2   Platelets 150 - 400 K/uL 309  242  239     ABG    Component Value Date/Time   PHART 7.45 07/09/2022 1733   PCO2ART 42 07/09/2022 1733   PO2ART 118 (H) 07/09/2022 1733   HCO3 29.2 (H)  07/09/2022 1733   TCO2 27 11/08/2021 0728   ACIDBASEDEF 6.2 (H) 07/01/2022 2005   O2SAT 100 07/09/2022 1733    CBG (last 3)  Recent Labs    07/10/22 0359 07/10/22 0745 07/10/22 1148  GLUCAP 154* 137* 188*     Will need to look at the big picture in future if life support becomes an issue based on likelihood of Trach > LTAC outcome   Will sign off for now - call if needed  Christinia Gully, MD Pulmonary and [place] [phone]   After 7:00 pm call Elink  [phone]

## 2022-07-10 NOTE — TOC Progression Note (Signed)
Transition of Care [facility]) - Progression Note    Patient Details  Name: DOMINQUE MARLIN MRN: [id number] Date of Birth: 1961/05/08  Transition of Care [facility]) CM/SW Contact  Boneta Lucks, RN Phone Number: 07/10/2022, 10:32 AM  Clinical Narrative:   Patient off vent on 3L, improving, updated [place] as to possible discharge 1-2 days.  Expected Discharge Plan: Long Term Nursing Home Barriers to Discharge: Continued Medical Work up  Expected Discharge Plan and Services Expected Discharge Plan: [place] In-house Referral: Clinical Social Work Discharge Planning Services: CM Consult Post Acute Care Choice: Nursing Home Living arrangements for the past 2 months: [place]                    Readmission Risk Interventions    06/30/2022   10:23 AM  Readmission Risk Prevention Plan  Transportation Screening Complete  Medication Review [employer]) Complete  HRI or Home Care Consult Complete  SW Recovery Care/Counseling Consult Complete  Palliative Care Screening Not Applicable  Skilled Nursing Facility Complete

## 2022-07-10 NOTE — Evaluation (Addendum)
Physical Therapy Evaluation Patient Details Name: Oscar Rose MRN: [id number] DOB: 1960/08/06 Today's Date: 07/10/2022  History of Present Illness  Oscar Rose is a 61 y.o. male admitted on 06/29/22 for respiratory distresss. Patient is a long term resident at [facility]. PMH significant for type 2 diabetes with nephropathy, gastroesophageal reflux disease, hypertension, hyperlipidemia, chronic kidney disease stage IIIb, chronic respiratory failure, sleep apnea, class II obesity and recent hospitalization with multifocal pneumonia.   Clinical Impression  Patient requiring max assist to sit up at bedside due to generalized weakness. Patient unable to maintain seated balance without therapist support demonstrating left and posterior leaning with poor trunk control, patient put back to bed with nurse in room. Patient requiring verbal/tactile cueing for all movement with fair/poor carryover. Patient will benefit from continued skilled physical therapy in hospital and recommended venue below to increase strength, balance, endurance for safe ADLs and gait.      Recommendations for follow up therapy are one component of a multi-disciplinary discharge planning process, led by the attending physician.  Recommendations may be updated based on patient status, additional functional criteria and insurance authorization.  Follow Up Recommendations Skilled nursing-short term rehab (<3 hours/day) Can patient physically be transported by private vehicle: No    Assistance Recommended at Discharge Frequent or constant Supervision/Assistance  Patient can return home with the following  A lot of help with bathing/dressing/bathroom;A lot of help with walking and/or transfers;Assistance with cooking/housework;Help with stairs or ramp for entrance    Equipment Recommendations None recommended by PT  Recommendations for Other Services       Functional Status Assessment Patient has had a recent decline in  their functional status and demonstrates the ability to make significant improvements in function in a reasonable and predictable amount of time.     Precautions / Restrictions Precautions Precautions: Fall Restrictions Weight Bearing Restrictions: No      Mobility  Bed Mobility Overal bed mobility: Needs Assistance Bed Mobility: Supine to Sit, Sit to Supine     Supine to sit: Max assist Sit to supine: Max assist   General bed mobility comments: patient requiring max assist and verbal/tactile cueing to move legs off EOB, elevate trunk and scoot to EOB due to generalized weakness and fatigue. Patient able to follow commands slightly but appears to be in a daze. Patient with poor seated balance, max assist for sit to supine    Transfers                        Ambulation/Gait                  Stairs            Wheelchair Mobility    Modified Rankin (Stroke Patients Only)       Balance Overall balance assessment: Needs assistance Sitting-balance support: Bilateral upper extremity supported, Feet supported Sitting balance-Leahy Scale: Poor Sitting balance - Comments: patient unable to maintain seated balance without therapist assist Postural control: Left lateral lean, Posterior lean                                   Pertinent Vitals/Pain Pain Assessment Pain Assessment: Faces Faces Pain Scale: No hurt    Home Living Family/patient expects to be discharged to:: Skilled nursing facility  Additional Comments: Patient is a long term resident at [facility]    Prior Function Prior Level of Function : Patient poor historian/Family not available             Mobility Comments: Patient was a short distance community ambulator with [place] one month ago upon last hospital admission/PT eval, patient now a poor historian and is unable to answer any questions ADLs Comments: Patient is a poor historian, was  independent with ADLs one month ago     Hand Dominance   Dominant Hand: Right    Extremity/Trunk Assessment   Upper Extremity Assessment Upper Extremity Assessment: Generalized weakness    Lower Extremity Assessment Lower Extremity Assessment: Generalized weakness    Cervical / Trunk Assessment Cervical / Trunk Assessment: Kyphotic  Communication   Communication: Expressive difficulties  Cognition Arousal/Alertness: Awake/alert Behavior During Therapy: WFL for tasks assessed/performed Overall Cognitive Status: Difficult to assess                                 General Comments: patient was verbal and able to answer questions one month ago upon last hospital visit/PT eval, pt now unable to answer questions and appears dazed        General Comments      Exercises     Assessment/Plan    PT Assessment Patient needs continued PT services  PT Problem List Decreased strength;Decreased activity tolerance;Decreased balance;Decreased mobility       PT Treatment Interventions DME instruction;Balance training;Gait training;Stair training;Functional mobility training;Patient/family education;Therapeutic activities;Therapeutic exercise    PT Goals (Current goals can be found in the Care Plan section)  Acute Rehab PT Goals Patient Stated Goal: not stated PT Goal Formulation: With patient Time For Goal Achievement: 07/24/22 Potential to Achieve Goals: Good    Frequency Min 3X/week     Co-evaluation               AM-PAC PT "6 Clicks" Mobility  Outcome Measure Help needed turning from your back to your side while in a flat bed without using bedrails?: A Lot Help needed moving from lying on your back to sitting on the side of a flat bed without using bedrails?: A Lot Help needed moving to and from a bed to a chair (including a wheelchair)?: Total Help needed standing up from a chair using your arms (e.g., wheelchair or bedside chair)?: Total Help  needed to walk in hospital room?: Total Help needed climbing 3-5 steps with a railing? : Total 6 Click Score: 8    End of Session Equipment Utilized During Treatment: Oxygen Activity Tolerance: Patient tolerated treatment well;Patient limited by fatigue Patient left: in bed;with nursing/sitter in room;with call bell/phone within reach Nurse Communication: Mobility status PT Visit Diagnosis: Unsteadiness on feet (R26.81);Other abnormalities of gait and mobility (R26.89);Muscle weakness (generalized) (M62.81)    Time: 1000-1016 PT Time Calculation (min) (ACUTE ONLY): 16 min   Charges:   PT Evaluation $PT Eval Low Complexity: 1 Low PT Treatments $Therapeutic Activity: 8-22 mins        Zigmund Gottron, SPT

## 2022-07-11 DIAGNOSIS — J9601 Acute respiratory failure with hypoxia: Secondary | ICD-10-CM | POA: Diagnosis not present

## 2022-07-11 LAB — GLUCOSE, CAPILLARY
Glucose-Capillary: 163 mg/dL — ABNORMAL HIGH (ref 70–99)
Glucose-Capillary: 171 mg/dL — ABNORMAL HIGH (ref 70–99)
Glucose-Capillary: 254 mg/dL — ABNORMAL HIGH (ref 70–99)
Glucose-Capillary: 262 mg/dL — ABNORMAL HIGH (ref 70–99)
Glucose-Capillary: 237 mg/dL — ABNORMAL HIGH (ref 70–99)

## 2022-07-11 LAB — CBC
HCT: 29.7 % — ABNORMAL LOW (ref 39.0–52.0)
Hemoglobin: 9.2 g/dL — ABNORMAL LOW (ref 13.0–17.0)
MCH: 28.3 pg (ref 26.0–34.0)
MCHC: 31 g/dL (ref 30.0–36.0)
MCV: 91.4 fL (ref 80.0–100.0)
Platelets: 320 10*3/uL (ref 150–400)
RBC: 3.25 MIL/uL — ABNORMAL LOW (ref 4.22–5.81)
RDW: 13.8 % (ref 11.5–15.5)
WBC: 11 10*3/uL — ABNORMAL HIGH (ref 4.0–10.5)
nRBC: 0 % (ref 0.0–0.2)

## 2022-07-11 LAB — BASIC METABOLIC PANEL
Anion gap: 8 (ref 5–15)
BUN: 95 mg/dL — ABNORMAL HIGH (ref 6–20)
CO2: 28 mmol/L (ref 22–32)
Calcium: 9.3 mg/dL (ref 8.9–10.3)
Chloride: 111 mmol/L (ref 98–111)
Creatinine, Ser: 2.42 mg/dL — ABNORMAL HIGH (ref 0.61–1.24)
GFR, Estimated: 30 mL/min — ABNORMAL LOW (ref >60)
Glucose, Bld: 177 mg/dL — ABNORMAL HIGH (ref 70–99)
Potassium: 4.2 mmol/L (ref 3.5–5.1)
Sodium: 147 mmol/L — ABNORMAL HIGH (ref 135–145)

## 2022-07-11 MED ORDER — CLONIDINE HCL 0.2 MG PO TABS
0.2000 mg | ORAL_TABLET | Freq: Three times a day (TID) | ORAL | Status: DC
  Administered 2022-07-11 – 2022-07-13 (×7): 0.2 mg via ORAL
  Filled 2022-07-11 (×8): qty 1

## 2022-07-11 MED ORDER — LABETALOL HCL 200 MG PO TABS
100.0000 mg | ORAL_TABLET | Freq: Two times a day (BID) | ORAL | Status: DC
  Administered 2022-07-11: 100 mg via ORAL
  Filled 2022-07-11: qty 1

## 2022-07-11 MED ORDER — LABETALOL HCL 5 MG/ML IV SOLN
10.0000 mg | INTRAVENOUS | Status: DC | PRN
  Administered 2022-07-11 – 2022-07-15 (×5): 10 mg via INTRAVENOUS
  Filled 2022-07-11 (×5): qty 4

## 2022-07-11 MED ORDER — INSULIN GLARGINE-YFGN 100 UNIT/ML ~~LOC~~ SOLN
10.0000 [IU] | Freq: Every day | SUBCUTANEOUS | Status: DC
  Administered 2022-07-11 – 2022-07-12 (×2): 10 [IU] via SUBCUTANEOUS
  Filled 2022-07-11 (×6): qty 0.1

## 2022-07-11 MED ORDER — ALBUMIN HUMAN 25 % IV SOLN
25.0000 g | Freq: Once | INTRAVENOUS | Status: AC
  Administered 2022-07-11: 25 g via INTRAVENOUS
  Filled 2022-07-11: qty 100

## 2022-07-11 MED ORDER — ATROPINE SULFATE 1 % OP SOLN
1.0000 [drp] | Freq: Three times a day (TID) | OPHTHALMIC | Status: DC
  Administered 2022-07-11: 1 [drp] via SUBLINGUAL
  Filled 2022-07-11: qty 2

## 2022-07-11 MED ORDER — DOCUSATE SODIUM 100 MG PO CAPS
200.0000 mg | ORAL_CAPSULE | Freq: Two times a day (BID) | ORAL | Status: DC
  Administered 2022-07-11 – 2022-07-13 (×5): 200 mg via ORAL
  Filled 2022-07-11 (×6): qty 2

## 2022-07-11 MED ORDER — MAGNESIUM SULFATE 2 GM/50ML IV SOLN
2.0000 g | Freq: Once | INTRAVENOUS | Status: AC
  Administered 2022-07-11: 2 g via INTRAVENOUS
  Filled 2022-07-11: qty 50

## 2022-07-11 MED ORDER — ATROPINE SULFATE 1 % OP SOLN: 2.0000 [drp] | Freq: Three times a day (TID) | OPHTHALMIC | Status: DC

## 2022-07-11 MED ORDER — HYDRALAZINE HCL 20 MG/ML IJ SOLN: 10.0000 mg | INTRAMUSCULAR | Status: DC | PRN

## 2022-07-11 NOTE — Progress Notes (Signed)
Speech Language Pathology Treatment: Dysphagia  Patient Details Name: Oscar Rose MRN: [id number] DOB: 05-04-1961 Today's Date: 07/11/2022 Time: 8242-3536 SLP Time Calculation (min) (ACUTE ONLY): 20 min  Assessment / Plan / Recommendation Clinical Impression  Ongoing dysphagia intervention to assess for possible upgrades and need for instrumental assessment. Pt continues to be aphonic despite max SLP cues to elicit vocalizations. RN reports that Pt seems to be tolerating puree and HTL. SLP assessed with ice chips (continues to do well with this small, controlled amount), thin water, NTL, and HTL. Pt with immediate cough when taking large cup/straw sips water 50% of the time despite cues for small sips. He still has occasional cough with NTL. Pt will need instrumental assessment via MBSS before he discharges to next level of care due to continued dysphonia (s/p prolonged intubation) and signs of reduced airway protection with thins. Continue diet as ordered, D1/puree and HTL and SLP to follow.    HPI HPI: Oscar Rose is a 61 y.o. male with medical history significant of type 2 diabetes with nephropathy, gastroesophageal reflux disease, hypertension, hyperlipidemia, chronic kidney disease stage IIIb, chronic respiratory failure, sleep apnea, class II obesity and recent hospitalization with multifocal pneumonia; presented from his extended care facility secondary to worsening shortness of breath and hypoxia.  On arrival to the emergency department patient condition further deteriorates and he was found to be obtunded with oxygen saturation in the mid 60s.  EMS personnel transported him providing bagging with bag-valve-mask.  Patient was intubated, mechanically ventilated and sedated. Workup in the ED demonstrating vascular congestion, pulmonary edema with concern for ARDS and multifocal pneumonia.  Patient COVID PCR was negative but he was found to be positive for influenza A. Pt extubated today. BSE  ordered.      SLP Plan  Continue with current plan of care      Recommendations for follow up therapy are one component of a multi-disciplinary discharge planning process, led by the attending physician.  Recommendations may be updated based on patient status, additional functional criteria and insurance authorization.    Recommendations  Diet recommendations: Dysphagia 1 (puree);Honey-thick liquid Liquids provided via: Cup Medication Administration: Whole meds with puree Supervision: Staff to assist with self feeding;Full supervision/cueing for compensatory strategies Compensations: Slow rate;Small sips/bites Postural Changes and/or Swallow Maneuvers: Seated upright 90 degrees;Upright 30-60 min after meal                Oral Care Recommendations: Oral care QID;Oral care prior to ice chip/H20;Staff/trained caregiver to provide oral care Follow Up Recommendations: Skilled nursing-short term rehab (<3 hours/day) Assistance recommended at discharge: Frequent or constant Supervision/Assistance SLP Visit Diagnosis: Dysphagia, unspecified (R13.10) Plan: Continue with current plan of care         Thank you,  Genene Churn, [place]   [place]  07/11/2022, 3:54 PM

## 2022-07-11 NOTE — Progress Notes (Signed)
Progress Note   [name]:[id number] DOB: [birth year] DOA: 06/29/2022     12 DOS: the patient was seen and examined on 07/11/2022    Subjective: 07/11/2022 The patient was seen and examined this morning, hemodynamically stable, much more awake, following commands In no respiratory distress Satting 97% on 2 L of oxygen  -Next of kin--Ms April Brame-- [phone] updated 12/06    Brief hospital course: Oscar Rose is a 61 y.o. male with medical history significant of type 2 diabetes with nephropathy, gastroesophageal reflux disease, hypertension, hyperlipidemia, chronic kidney disease stage IIIb, chronic respiratory failure, sleep apnea, class II obesity and recent hospitalization with multifocal pneumonia; presented from his extended care facility secondary to worsening shortness of breath and hypoxia.  On arrival to the emergency department patient condition further deteriorates and he was found to be obtunded with oxygen saturation in the mid 60s.  EMS personnel transported him providing bagging with bag-valve-mask.  Patient was intubated, mechanically ventilated and sedated.  At time of my evaluation unable to answer any questions or provide any history. Per EDP notes, EMS reports and information from his facility no chest pain, no nausea, no vomiting, no dysuria, no melena, no hematochezia, no focal deficits or any other complaints.   Workup in the ED demonstrating vascular congestion, pulmonary edema with concern for ARDS and multifocal pneumonia.  Patient COVID PCR was negative but he was found to be positive for influenza A.  TRH has been consulted to place patient in the hospital for further evaluation and management.  07/03/22  -Dr. Halford Chessman recommends transfer to [facility] ICU with pulmonary critical care service managing patient/primary team -Patient with significant increased oxygen requirement, more difficult to ventilate over the past couple of days and  demonstrating worsening renal function with marginal output while receiving diuresis.   -Patient is currently awaiting ICU bed at [facility] for further critical care management. - 07/04/22 Failed sedation vacation,  failed weaning trial  07/05/22  -Patient did poorly with sedation vacation and weaning trial-- Patient weaned on 10/5 with 40% from 1322 to 1455. At 1455 desaturation to 86%,1500 BP 221/82 patient was returned to rest mode of PRVC 26/ 600/ 80%/ PEEP 5. He was unable to maintain an adequate SpO2 on 40% at that time.,  FiO2 was increased to 80% temporarily  07/06/22 -Switched from IV propofol to Precedex--did better with weaning trial (almost 3 hrs) on Precedex  07/07/22 -Did not do well with pressure support/weaning trial--- lasted less than 2 hours -Back on vent -ET tube advanced  -07/09/22 -Intubated 06/29/2022 -Extubated 07/09/2022... Currently on nasal cannula at 5 L -May use BiPAP as needed and nightly  07/10/2222 -The patient was seen and examined, remains extubated, on 3 L of oxygen satting 99% -Still lethargic, but follows commands  07/11/2022 -Hemodynamically stable, mentation improving ---------------------------------------------------------------------------------------------------------------------------------------------  Assessment and Plan:  1)Acute respiratory failure with hypoxia/influenza A and HCAP -Hemodynamically stable, much improved respiratory status, down to 2 L of oxygen, satting 92%   -Acute respiratory failure with hypoxia secondary to pulmonary edema/acute on chronic systolic heart failure, influenza  A and multifocal pneumonia. - completed Tamiflu as ordered (5 day course started on 06/29/2022) Completed vanco,cefepime/Zosyn and azithro---last dose 07/06/2022 for HCAP protocol -Treated with Solu-Medrol, transitioned to prednisone -Continue diuresis with IV Lasix -Continue bronchodilators -Pulmonary critical care consult from Dr.  Halford Chessman and Dr Melvyn Novas appreciated -07/09/22 -Intubated 06/29/2022 -Extubated 07/09/2022... Currently on nasal cannula at 5 L -May use BiPAP as needed and  nightly  2)HCAP (healthcare-associated pneumonia) --Management as above #1 Blood cultures from 06/29/2022 with PROPIONIBACTERIUM ACNES  Suspect this is a contaminant-- -completed antibiotics as above #1  3)AKI----acute kidney injury on CKD stage -IV -Creatinine was 2.2 on 11/08/2021, creatinine was 2.8 on 05/15/2022 -Creatinine was 2.55 on admission on 06/29/2022 Lab Results  Component Value Date   CREATININE 2.42 (H) 07/11/2022   CREATININE 2.42 (H) 07/10/2022   CREATININE 2.43 (H) 07/09/2022   -Creatinine continues to improve after peaking at 3.66    -renally adjust medications, avoid nephrotoxic agents / dehydration  / hypotension  4) hyperkalemia- -Resolved with interventions  5) acute on chronic anemia--Hgb is down to 8.6 from 10.2 on admission -Baseline hemoglobin usually around 9 -No obvious bleeding at this time continue to monitor    Latest Ref Rng & Units 07/11/2022    3:59 AM 07/10/2022    3:51 AM 07/09/2022    4:05 AM  CBC  WBC 4.0 - 10.5 K/uL 11.0  9.3  5.7   Hemoglobin 13.0 - 17.0 g/dL 9.2  9.0  8.6   Hematocrit 39.0 - 52.0 % 29.7  29.2  27.4   Platelets 150 - 400 K/uL 320  309  242        6)ARDS (adult respiratory distress syndrome) (HCC) -Much improved -Continue ARDS protocol and his ventilatory settings - PCCM recommendations by Dr. Deland Pretty appreciated -Please see #1 above  7)Elevated Troponin -Appears to be demand ischemia in the setting of hypoxic respiratory failure -No ischemic changes appreciated on EKG and telementry -Echo from 05/13/2022 with EF of 55 to 60%, still showed wall motion abnormalities, with hypokinesis of the left ventricular, apical anterior inferior wall and lateral as well as inferior lateral and apical segments -No mitral stenosis or aortic stenosis -Continue treatment with  Lipitor, aspirin and Plavix  8)Influenza A with pneumonia Completed Tamiflu as above #1 -Improved  9)Acute on chronic systolic CHF (congestive heart failure) (HCC) -Elevated BNP --Continue IV diuresis -Echo as above #7  10)Type 2 diabetes mellitus ([place]) -With nephropathy; chronic kidney disease a stage IV -A1c 5.5 reflecting excellent diabetic control PTA -Suspect steroid-induced hyperglycemia 07/09/22 - insulin regimen--Lantus and NovoLog adjusted  Titrating as p.o. intake improving Use Novolog/Humalog Sliding scale insulin with Accu-Cheks/Fingersticks as ordered   11)Hypertension -continue amlodipine, hydralazine,  -may use IV labetalol as needed elevated BP - 12)FEN--tube feeding stopped after extubation on 07/09/2022 - 13)Social/Ethics---no living Blood  relatives here [place] (his blood relatives live [place]),  Ms April Brame-- [phone]---is his Primary contact (she is the sister of Mr. Weniger's ex Fiancee who died in 2015-10-21) - -Prophylaxis Continue Protonix for GI prophylaxis -Continue Lovenox for DVT prophylaxis     Physical Exam: Vitals:   07/11/22 0732 07/11/22 0743 07/11/22 0800 07/11/22 0900  BP:   (!) 206/89 (!) 173/67  Pulse: 90  (!) 101 97  Resp: 17  16 20   Temp: 98.8 F (37.1 C)     TempSrc: Oral     SpO2: 98% 100% 97% 97%  Weight:      Height:          Physical Exam:   General:  AAO x 2,  cooperative, no distress;   HEENT:  Normocephalic, PERRL, otherwise with in Normal limits   Neuro:  CNII-XII intact. , normal motor and sensation, reflexes intact   Lungs:   Clear to auscultation BL, Respirations unlabored,  No wheezes / crackles  Cardio:    S1/S2, RRR, No murmure, No  Rubs or Gallops   Abdomen:  Soft, non-tender, bowel sounds active all four quadrants, no guarding or peritoneal signs.  Muscular  skeletal:  Limited exam -sever global generalized weaknesses Previously amputated toes--  - in bed, able to move all 4 extremities,   2+  pulses,  symmetric, No pitting edema  Skin:  Dry, warm to touch, negative for any Rashes,  Wounds: Please see nursing documentation  Pressure Injury 07/05/22 Coccyx Medial Stage 2 -  Partial thickness loss of dermis presenting as a shallow open injury with a red, pink wound bed without slough. (Active)  07/05/22 1130  Location: Coccyx  Location Orientation: Medial  Staging: Stage 2 -  Partial thickness loss of dermis presenting as a shallow open injury with a red, pink wound bed without slough.  Wound Description (Comments):   Present on Admission: No  Dressing Type Foam - Lift dressing to assess site every shift 07/11/22 0800           -MSK-prior right big toe amputation, prior left transmetatarsal amputation GU-Foley with clear urine Rectal Tube in situ  Family Communication: Updated Ms April Brame-- [phone]---she is his Primary contact (she is the sister of Mr. Odowd's ex Celesta Gentile who died in 2015/11/19)  Disposition: -Possibly discharge back to [facility] when medically stable patient/primary team Status is: Inpatient Remains inpatient appropriate because: Continue treatment for ARDS, multifocal pneumonia and pulmonary edema due to acute on chronic systolic heart failure.   Planned Discharge Destination: Skilled nursing facility... Currently Not stable for discharge   Author: Deatra James, MD 07/11/2022 10:05 AM  For on call review [email or web address].    CRITICAL CARE Performed by: Deatra James  Total critical care time: 56 minutes Critical care time was exclusive of separately billable procedures and treating other patients. Critical care was necessary to treat or prevent imminent or life-threatening deterioration. - Extubated 07/09/2022---currently on nasal cannula at 5 L >>> 3 L now   Critical care was time spent personally by me on the following activities: development of treatment plan with patient and/or surrogate as well as nursing, discussions with  consultants, evaluation of patient's response to treatment, examination of patient, obtaining history from patient or surrogate, ordering and performing treatments and interventions, ordering and review of laboratory studies, ordering and review of radiographic studies, pulse oximetry and re-evaluation of patient's condition.

## 2022-07-11 NOTE — Progress Notes (Signed)
Pt spo2 96% room air , o2 not connected, pt in no distress

## 2022-07-12 ENCOUNTER — Inpatient Hospital Stay (HOSPITAL_COMMUNITY): Payer: Medicare Other

## 2022-07-12 DIAGNOSIS — J9601 Acute respiratory failure with hypoxia: Secondary | ICD-10-CM | POA: Diagnosis not present

## 2022-07-12 LAB — CBC
HCT: 30.4 % — ABNORMAL LOW (ref 39.0–52.0)
Hemoglobin: 9.3 g/dL — ABNORMAL LOW (ref 13.0–17.0)
MCH: 28 pg (ref 26.0–34.0)
MCHC: 30.6 g/dL (ref 30.0–36.0)
MCV: 91.6 fL (ref 80.0–100.0)
Platelets: 339 10*3/uL (ref 150–400)
RBC: 3.32 MIL/uL — ABNORMAL LOW (ref 4.22–5.81)
RDW: 14 % (ref 11.5–15.5)
WBC: 6.5 10*3/uL (ref 4.0–10.5)
nRBC: 0 % (ref 0.0–0.2)

## 2022-07-12 LAB — GLUCOSE, CAPILLARY
Glucose-Capillary: 164 mg/dL — ABNORMAL HIGH (ref 70–99)
Glucose-Capillary: 190 mg/dL — ABNORMAL HIGH (ref 70–99)
Glucose-Capillary: 212 mg/dL — ABNORMAL HIGH (ref 70–99)
Glucose-Capillary: 288 mg/dL — ABNORMAL HIGH (ref 70–99)
Glucose-Capillary: 345 mg/dL — ABNORMAL HIGH (ref 70–99)

## 2022-07-12 LAB — BASIC METABOLIC PANEL
Anion gap: 7 (ref 5–15)
BUN: 92 mg/dL — ABNORMAL HIGH (ref 6–20)
CO2: 29 mmol/L (ref 22–32)
Calcium: 9.4 mg/dL (ref 8.9–10.3)
Chloride: 113 mmol/L — ABNORMAL HIGH (ref 98–111)
Creatinine, Ser: 2.42 mg/dL — ABNORMAL HIGH (ref 0.61–1.24)
GFR, Estimated: 30 mL/min — ABNORMAL LOW (ref >60)
Glucose, Bld: 172 mg/dL — ABNORMAL HIGH (ref 70–99)
Potassium: 4 mmol/L (ref 3.5–5.1)
Sodium: 149 mmol/L — ABNORMAL HIGH (ref 135–145)

## 2022-07-12 MED ORDER — GLYCOPYRROLATE 1 MG PO TABS
1.0000 mg | ORAL_TABLET | Freq: Three times a day (TID) | ORAL | Status: DC
  Administered 2022-07-12 – 2022-07-13 (×5): 1 mg via ORAL
  Filled 2022-07-12 (×6): qty 1

## 2022-07-12 MED ORDER — TAMSULOSIN HCL 0.4 MG PO CAPS
0.4000 mg | ORAL_CAPSULE | Freq: Every day | ORAL | Status: DC
  Administered 2022-07-12 – 2022-07-13 (×2): 0.4 mg via ORAL
  Filled 2022-07-12 (×4): qty 1

## 2022-07-12 MED ORDER — LABETALOL HCL 200 MG PO TABS
200.0000 mg | ORAL_TABLET | Freq: Two times a day (BID) | ORAL | Status: DC
  Administered 2022-07-12 – 2022-07-13 (×3): 200 mg via ORAL
  Filled 2022-07-12 (×6): qty 1

## 2022-07-12 MED ORDER — SODIUM CHLORIDE 0.9 % IV SOLN: INTRAVENOUS | Status: AC

## 2022-07-12 MED ORDER — FUROSEMIDE 10 MG/ML IJ SOLN
20.0000 mg | Freq: Two times a day (BID) | INTRAMUSCULAR | Status: DC
  Administered 2022-07-12 – 2022-07-14 (×4): 20 mg via INTRAVENOUS
  Filled 2022-07-12 (×4): qty 2

## 2022-07-12 NOTE — Progress Notes (Signed)
Progress Note   [name] JGG:[id number] DOB: 1960/11/14 DOA: 06/29/2022     13 DOS: the patient was seen and examined on 07/12/2022    Subjective: 07/11/2022 Oscar Rose was seen and examined this morning, much more awake, although speech is slow and muffled following command, tolerating some p.o. -Overnight had urinary retention, Foley catheter had to be replaced  -Next of kin--Ms April Brame-- [phone] updated 12/06    Brief hospital course: Oscar Rose is a 61 y.o. male with medical history significant of type 2 diabetes with nephropathy, gastroesophageal reflux disease, hypertension, hyperlipidemia, chronic kidney disease stage IIIb, chronic respiratory failure, sleep apnea, class II obesity and recent hospitalization with multifocal pneumonia; presented from his extended care facility secondary to worsening shortness of breath and hypoxia.  On arrival to the emergency department patient condition further deteriorates and he was found to be obtunded with oxygen saturation in the mid 60s.  EMS personnel transported him providing bagging with bag-valve-mask.  Patient was intubated, mechanically ventilated and sedated.  At time of my evaluation unable to answer any questions or provide any history. Per Oscar Rose notes, EMS reports and information from his facility no chest pain, no nausea, no vomiting, no dysuria, no melena, no hematochezia, no focal deficits or any other complaints.   Workup in the ED demonstrating vascular congestion, pulmonary edema with concern for ARDS and multifocal pneumonia.  Patient COVID PCR was negative but he was found to be positive for influenza A.  TRH has been consulted to place patient in the hospital for further evaluation and management.  07/03/22  -Dr. Halford Rose recommends transfer to [facility] ICU with pulmonary critical care service managing patient/primary team -Patient with significant increased oxygen requirement, more difficult to  ventilate over the past couple of days and demonstrating worsening renal function with marginal output while receiving diuresis.   -Patient is currently awaiting ICU bed at [facility] for further critical care management. - 07/04/22 Failed sedation vacation,  failed weaning trial  07/05/22  -Patient did poorly with sedation vacation and weaning trial-- Patient weaned on 10/5 with 40% from 1322 to 1455. At 1455 desaturation to 86%,1500 BP 221/82 patient was returned to rest mode of PRVC 26/ 600/ 80%/ PEEP 5. He was unable to maintain an adequate SpO2 on 40% at that time.,  FiO2 was increased to 80% temporarily  07/06/22 -Switched from IV propofol to Precedex--did better with weaning trial (almost 3 hrs) on Precedex  07/07/22 -Did not do well with pressure support/weaning trial--- lasted less than 2 hours -Back on vent -ET tube advanced  -07/09/22 -Intubated 06/29/2022 -Extubated 07/09/2022... Currently on nasal cannula at 5 L -May use BiPAP as needed and nightly  07/10/2222 -The patient was seen and examined, remains extubated, on 3 L of oxygen satting 99% -Still lethargic, but follows commands  07/11/2022 -Hemodynamically stable, mentation improving  07/12/2022 -Late yesterday evening and overnight patient had urinary retention, Foley catheter had to be reinserted -Much more awake alert, following commands-speech slow muffled -Tolerating p.o. ---------------------------------------------------------------------------------------------------------------------------------------------  Assessment and Plan:  1)Acute respiratory failure with hypoxia/influenza A and HCAP -Hemodynamically stable, satting 100% on 2 L of oxygen -    -Acute respiratory failure with hypoxia secondary to pulmonary edema/acute on chronic systolic heart failure, influenza  A and multifocal pneumonia. - completed Tamiflu as ordered (5 day course started on 06/29/2022) Completed vanco,cefepime/Zosyn and  azithro---last dose 07/06/2022 for HCAP protocol -Treated with Solu-Medrol, transitioned to prednisone -Continue diuresis with IV Lasix -  Continue bronchodilators -Pulmonary critical care consult from Dr. Halford Rose and Dr Oscar Rose >>>   -07/09/22 -Intubated 06/29/2022 -Extubated 07/09/2022... Currently on nasal cannula at 5 L -May use BiPAP as needed and nightly  2)HCAP (healthcare-associated pneumonia) --Management as above #1 Blood cultures from 06/29/2022 with PROPIONIBACTERIUM ACNES  Suspect this is a contaminant-- -completed antibiotics as above #1  3)AKI----acute kidney injury on CKD stage -IV -Creatinine was 2.2 on 11/08/2021, creatinine was 2.8 on 05/15/2022 Lab Results  Component Value Date   CREATININE 2.42 (H) 07/12/2022   CREATININE 2.42 (H) 07/11/2022   CREATININE 2.42 (H) 07/10/2022    -Creatinine continues to improve after peaking at 3.66    -renally adjust medications, avoid nephrotoxic agents / dehydration  / hypotension  4) hyperkalemia- -Resolved with interventions  5) acute on chronic anemia--Hgb is down to 8.6 from 10.2 on admission -Baseline hemoglobin usually around 9 -No obvious bleeding at this time continue to monitor   6)ARDS (adult respiratory distress syndrome) (HCC) -Much improved -Continue ARDS protocol and his ventilatory settings - PCCM recommendations by Dr. Deland Rose appreciated -Please see #1 above  7)Elevated Troponin -Appears to be demand ischemia in the setting of hypoxic respiratory failure -No ischemic changes appreciated on EKG and telementry -Echo from 05/13/2022 with EF of 55 to 60%, still showed wall motion abnormalities, with hypokinesis of the left ventricular, apical anterior inferior wall and lateral as well as inferior lateral and apical segments -No mitral stenosis or aortic stenosis -Continue treatment with Lipitor, aspirin and Plavix  8)Influenza A with pneumonia Completed Tamiflu as above #1 -Improved  9)Acute on chronic  systolic CHF (congestive heart failure) (HCC) -Elevated BNP --Continue IV diuresis -Echo as above #7  10)Type 2 diabetes mellitus ([place]) -With nephropathy; chronic kidney disease a stage IV -A1c 5.5 reflecting excellent diabetic control PTA -Suspect steroid-induced hyperglycemia 07/09/22 - insulin regimen--Lantus and NovoLog adjusted  Titrating as p.o. intake improving Use Novolog/Humalog Sliding scale insulin with Accu-Cheks/Fingersticks as ordered   11)Hypertension -Blood pressure elevated -continue amlodipine, hydralazine, clonidine, added labetalol  -may use IV labetalol as needed elevated BP -  12)FEN--tube feeding stopped after extubation on 07/09/2022 -Status post speech evaluation, still recommending dysphagia 3 solids, nectar thick liquid diet  Liquid Administration via: Cup;Spoon    Medication Administration: Whole meds with puree  Compensations: Slow rate;Small sips/bites  Postural Changes: Seated upright at 90 degrees  Oral Care Recommendations: Oral care BID  Other Recommendations: Remove water pitcher;Prohibited food (jello, ice cream, thin soups)     13)Social/Ethics---no living Blood  relatives here in [place] (his blood relatives live [place]),  Ms April Brame-- [phone]---is his Primary contact (she is the sister of Mr. Reily's ex Celesta Gentile who died in 11-07-15) - -Prophylaxis Continue Protonix for GI prophylaxis -Continue Lovenox for DVT prophylaxis  14) urinary retention -Foley catheter was replaced on 07/11/2022 monitoring I's and O's     Physical Exam: Vitals:   07/12/22 0721 07/12/22 0800 07/12/22 0900 07/12/22 1000  BP:  (!) 160/129 (!) 146/60 (!) 152/109  Pulse: 95 96 98 99  Resp: 12 16    Temp: 98.4 F (36.9 C)     TempSrc: Oral     SpO2: 100% 97% 98% 100%  Weight:      Height:        General:  AAO x 2,  cooperative, no distress; speech slow, muffled,  HEENT:  Normocephalic, PERRL, otherwise with in Normal limits   Neuro:  CNII-XII  intact. , normal motor and sensation, reflexes  intact   Lungs:   Clear to auscultation BL, Respirations unlabored,  No wheezes / crackles  Cardio:    S1/S2, RRR, No murmure, No Rubs or Gallops   Abdomen:  Soft, non-tender, bowel sounds active all four quadrants, no guarding or peritoneal signs.  Muscular  skeletal:  Limited exam -sever global generalized weaknesses - in bed, able to move all 4 extremities,   2+ pulses,  symmetric, No pitting edema  Skin:  Dry, warm to touch, negative for any Rashes,  Wounds: Please see nursing documentation  Pressure Injury 07/05/22 Coccyx Medial Stage 2 -  Partial thickness loss of dermis presenting as a shallow open injury with a red, pink wound bed without slough. (Active)  07/05/22 1130  Location: Coccyx  Location Orientation: Medial  Staging: Stage 2 -  Partial thickness loss of dermis presenting as a shallow open injury with a red, pink wound bed without slough.  Wound Description (Comments):   Present on Admission: No  Dressing Type Foam - Lift dressing to assess site every shift 07/12/22 0800      -MSK-prior right big toe amputation, prior left transmetatarsal amputation GU-Foley with clear urine  Family Communication: Updated Ms April Brame-- [phone]---she is his Primary contact (she is the sister of Mr. Schwarting's ex Celesta Gentile who died in 10-30-2015)  Disposition: -Possibly discharge [facility] to [facility] when medically stable patient/primary team Status is: Inpatient Remains inpatient appropriate because: Continue treatment for ARDS, multifocal pneumonia and pulmonary edema due to acute on chronic systolic heart failure.   Planned Discharge Destination: Skilled nursing facility... Currently Not stable for discharge   Author: Deatra James, MD 07/12/2022 11:55 AM  For on call review [email or web address].    CRITICAL CARE Performed by: Deatra James  Total critical care time: 56 minutes Critical care time was exclusive of  separately billable procedures and treating other patients. Critical care was necessary to treat or prevent imminent or life-threatening deterioration. - Extubated 07/09/2022---currently on nasal cannula at 5 L >>> 2  L now   Critical care was time spent personally by me on the following activities: development of treatment plan with patient and/or surrogate as well as nursing, discussions with consultants, evaluation of patient's response to treatment, examination of patient, obtaining history from patient or surrogate, ordering and performing treatments and interventions, ordering and review of laboratory studies, ordering and review of radiographic studies, pulse oximetry and re-evaluation of patient's condition

## 2022-07-12 NOTE — Progress Notes (Signed)
Speech Language Pathology Treatment: Dysphagia  Patient Details Name: Oscar Rose MRN: [id number] DOB: 1960-08-24 Today's Date: 07/12/2022 Time: 0938-1829 SLP Time Calculation (min) (ACUTE ONLY): 17 min  Assessment / Plan / Recommendation Clinical Impression  Pt was provided ongoing diagnostic dysphagia treatment this morning with his breakfast tray. Note occasional very soft voicing this morning; he is still predominantly aphonic and is unable to voice on command but some voicing was noted with talking. Pt continues to tolerate ice chips well with no coughing noted. With HTL note delayed wet coughing with most presentations. Pt did not want any puree during this treatment. Recommend proceed with MBSS later this morning in order to objectively assess the swallowing function. Thank you,    HPI HPI: Oscar Rose is a 61 y.o. male with medical history significant of type 2 diabetes with nephropathy, gastroesophageal reflux disease, hypertension, hyperlipidemia, chronic kidney disease stage IIIb, chronic respiratory failure, sleep apnea, class II obesity and recent hospitalization with multifocal pneumonia; presented from his extended care facility secondary to worsening shortness of breath and hypoxia.  On arrival to the emergency department patient condition further deteriorates and he was found to be obtunded with oxygen saturation in the mid 60s.  EMS personnel transported him providing bagging with bag-valve-mask.  Patient was intubated, mechanically ventilated and sedated. Workup in the ED demonstrating vascular congestion, pulmonary edema with concern for ARDS and multifocal pneumonia.  Patient COVID PCR was negative but he was found to be positive for influenza A. Pt extubated today. BSE ordered.      SLP Plan  Continue with current plan of care      Recommendations for follow up therapy are one component of a multi-disciplinary discharge planning process, led by the attending physician.   Recommendations may be updated based on patient status, additional functional criteria and insurance authorization.    Recommendations  Diet recommendations: Dysphagia 1 (puree);Honey-thick liquid Liquids provided via: Cup Medication Administration: Whole meds with puree Supervision: Staff to assist with self feeding;Full supervision/cueing for compensatory strategies Compensations: Slow rate;Small sips/bites Postural Changes and/or Swallow Maneuvers: Seated upright 90 degrees;Upright 30-60 min after meal                Oral Care Recommendations: Oral care QID;Oral care prior to ice chip/H20;Staff/trained caregiver to provide oral care Follow Up Recommendations: Skilled nursing-short term rehab (<3 hours/day) Assistance recommended at discharge: Frequent or constant Supervision/Assistance SLP Visit Diagnosis: Dysphagia, unspecified (R13.10) Plan: Continue with current plan of care          Kasumi Ditullio H. Roddie Mc, CCC-SLP Speech Language Pathologist  Wende Bushy  07/12/2022, 9:11 AM

## 2022-07-12 NOTE — Progress Notes (Signed)
Patient could hardly blow flutter.

## 2022-07-12 NOTE — Evaluation (Signed)
Modified Barium Swallow Progress Note  Patient Details  Name: [name] MRN: [id number] Date of Birth: 1960-12-31  Today's Date: 07/12/2022  Modified Barium Swallow completed.  Full report located under Chart Review in the Imaging Section.  Brief recommendations include the following:  Clinical Impression  Pt presents with mild/moderate oropharyngeal post intubation dysphagia characterized by decreased epiglottic deflection, decreased BOT retraction, decreased pharyngeal paristalsis resulting in trace to mod amounts of silent aspiration of thin liquids with larger sips and when using the straw. NO ASPIRATION was visualized with small single cup sips of thin liquids in isolation from food, however, with cup sips after bites of puree or regular resulted in trace silent aspiraiton of thin liquid. No aspiration of NTL was visualized, note increased slightly increased pharyngeal residue with NTL. Pt tolerated puree and regular textures with swallow triggered at the valleculae, and only min/trace residue in the valleculae after the swallow. Pt continues to be predominantly aphonic and question laryngeal edema? Recommend upgrade Pt's diet to D3/mech soft and NECTAR thick liquids; suspect subjective upgrade to thin liquids as Pt continues to improve. After oral care and in between meals, thin water and ice chips are ok per free water protocol. ST will continue to treat acutely.   Swallow Evaluation Recommendations       SLP Diet Recommendations: Dysphagia 3 (Mech soft) solids;Nectar thick liquid   Liquid Administration via: Cup;Spoon   Medication Administration: Whole meds with puree       Compensations: Slow rate;Small sips/bites   Postural Changes: Seated upright at 90 degrees   Oral Care Recommendations: Oral care BID   Other Recommendations: Remove water pitcher;Prohibited food (jello, ice cream, thin soups)   Tramaine Sauls H. Roddie Mc, [place] Speech Language Pathologist  Wende Bushy 07/12/2022,11:34 AM

## 2022-07-12 NOTE — Evaluation (Signed)
Occupational Therapy Evaluation Patient Details Name: Oscar Rose MRN: [id number] DOB: Feb 04, 1961 Today's Date: 07/12/2022   History of Present Illness Oscar Rose is a 61 y.o. male admitted on 06/29/22 for respiratory distresss. Patient is a long term resident at [facility]. PMH significant for type 2 diabetes with nephropathy, gastroesophageal reflux disease, hypertension, hyperlipidemia, chronic kidney disease stage IIIb, chronic respiratory failure, sleep apnea, class II obesity and recent hospitalization with multifocal pneumonia.   Clinical Impression   Pt agreeable to OT and PT co-evaluation. Pt presented very weak but able to complete bed mobility to mod A today. Once supported by B UE pt was able to maintain his balance. Pt could speak with therapist's in a very soft tone today. B UE strength limited to 2+/5 at the shoulder. Moderate assistance needed to transfer from the bed to the chair using the RW. Much verbal and tactile cuing to maintain upright position and not sit until safe. Pt left in the chair with call bell within reach. Pt will benefit from continued OT in the hospital and recommended venue below to increase strength, balance, and endurance for safe ADL's.         Recommendations for follow up therapy are one component of a multi-disciplinary discharge planning process, led by the attending physician.  Recommendations may be updated based on patient status, additional functional criteria and insurance authorization.   Follow Up Recommendations  Skilled nursing-short term rehab (<3 hours/day)     Assistance Recommended at Discharge Frequent or constant Supervision/Assistance  Patient can return home with the following A lot of help with walking and/or transfers;A lot of help with bathing/dressing/bathroom;Assistance with cooking/housework;Assistance with feeding;Assist for transportation;Help with stairs or ramp for entrance    Functional Status Assessment   Patient has had a recent decline in their functional status and demonstrates the ability to make significant improvements in function in a reasonable and predictable amount of time.  Equipment Recommendations  None recommended by OT    Recommendations for Other Services       Precautions / Restrictions Precautions Precautions: Fall Restrictions Weight Bearing Restrictions: No      Mobility Bed Mobility Overal bed mobility: Needs Assistance Bed Mobility: Supine to Sit     Supine to sit: Mod assist, HOB elevated     General bed mobility comments: as Per PT    Transfers Overall transfer level: Needs assistance Equipment used: Rolling walker (2 wheels) Transfers: Sit to/from Stand, Bed to chair/wheelchair/BSC Sit to Stand: Min assist, Mod assist     Step pivot transfers: Mod assist     General transfer comment: as per PT      Balance Overall balance assessment: Needs assistance Sitting-balance support: Bilateral upper extremity supported, Feet supported Sitting balance-Leahy Scale: Fair Sitting balance - Comments: fair seated at EOB   Standing balance support: During functional activity, Reliant on assistive device for balance, Bilateral upper extremity supported Standing balance-Leahy Scale: Fair Standing balance comment: poor/fair with RW                           ADL either performed or assessed with clinical judgement   ADL Overall ADL's : Needs assistance/impaired Eating/Feeding: Set up;Minimal assistance;Sitting   Grooming: Moderate assistance;Maximal assistance;Sitting   Upper Body Bathing: Moderate assistance;Maximal assistance;Sitting   Lower Body Bathing: Total assistance;Bed level   Upper Body Dressing : Moderate assistance;Maximal assistance;Sitting   Lower Body Dressing: Total assistance;Bed level   Toilet Transfer:  Moderate assistance;Stand-pivot;Rolling walker (2 wheels) Toilet Transfer Details (indicate cue type and reason):  Simulated via EOB to chair transfer. Toileting- Clothing Manipulation and Hygiene: Total assistance;Bed level               Vision Baseline Vision/History: 0 No visual deficits Ability to See in Adequate Light: 0 Adequate Patient Visual Report: No change from baseline Vision Assessment?: Yes Tracking/Visual Pursuits: Other (comment) (Mild difficulty tracking R lower quadrant.) Additional Comments: Mild L eye strabismus.                Pertinent Vitals/Pain Pain Assessment Pain Assessment: No/denies pain     Hand Dominance Right   Extremity/Trunk Assessment Upper Extremity Assessment Upper Extremity Assessment: Generalized weakness (Limited to 2+/5 for shoulder flexion bilaterally with [facility] P/ROM for shoulder flexion and abduction.)   Lower Extremity Assessment Lower Extremity Assessment: Defer to PT evaluation   Cervical / Trunk Assessment Cervical / Trunk Assessment: Kyphotic   Communication Communication Communication: Expressive difficulties   Cognition Arousal/Alertness: Awake/alert Behavior During Therapy: WFL for tasks assessed/performed Overall Cognitive Status: Within Functional Limits for tasks assessed                                 General Comments: Awake and able to spean in a low tone.                      Home Living Family/patient expects to be discharged to:: Skilled nursing facility                                 Additional Comments: Patient is a long term resident at [facility]      Prior Functioning/Environment Prior Level of Function : Needs assist       Physical Assist : Mobility (physical);ADLs (physical) Mobility (physical): Transfers;Gait ADLs (physical): IADLs Mobility Comments: Patient was a short distance community ambulator with [facility] one month ago upon last hospital admission/PT eval, patient now a poor historian and is unable to answer any questions (Per PT.) ADLs Comments: Pt  reports being independent with ADL's and the SNF.        OT Problem List: Decreased strength;Decreased range of motion;Decreased activity tolerance;Impaired balance (sitting and/or standing);Impaired UE functional use      OT Treatment/Interventions: Self-care/ADL training;Therapeutic exercise;Therapeutic activities;Patient/family education;Balance training    OT Goals(Current goals can be found in the care plan section) Acute Rehab OT Goals Patient Stated Goal: Return to SNF OT Goal Formulation: With patient Time For Goal Achievement: 07/26/22 Potential to Achieve Goals: Good  OT Frequency: Min 2X/week    Co-evaluation PT/OT/SLP Co-Evaluation/Treatment: Yes Reason for Co-Treatment: To address functional/ADL transfers   OT goals addressed during session: ADL's and self-care                       End of Session Equipment Utilized During Treatment: Rolling walker (2 wheels);Gait belt  Activity Tolerance: Patient tolerated treatment well Patient left: in chair;with call bell/phone within reach  OT Visit Diagnosis: Unsteadiness on feet (R26.81);Other abnormalities of gait and mobility (R26.89);Muscle weakness (generalized) (M62.81)                Time: 8242-3536 OT Time Calculation (min): 16 min Charges:  OT General Charges $OT Visit: 1 Visit OT Evaluation $OT Eval Low Complexity: 1 Low  Lianny Molter  Gwenyth Dingee OT, MOT  Larey Seat 07/12/2022, 9:47 AM

## 2022-07-12 NOTE — Progress Notes (Addendum)
Physical Therapy Treatment Patient Details Name: Oscar Rose MRN: [id number] DOB: 1961/07/30 Today's Date: 07/12/2022   History of Present Illness Oscar Rose is a 61 y.o. male admitted on 06/29/22 for respiratory distresss. Patient is a long term resident at [facility]. PMH significant for type 2 diabetes with nephropathy, gastroesophageal reflux disease, hypertension, hyperlipidemia, chronic kidney disease stage IIIb, chronic respiratory failure, sleep apnea, class II obesity and recent hospitalization with multifocal pneumonia.    PT Comments    Patient presents in bed more awake and engaged than previous session. Patient put on room air due to SpO2 at 99% on 1.5 LPM O2. Patient able to sit up at bedside with mod assist, now able to maintain seated balance without therapist support. Patient then able to stand and transfer to chair with mod assist/RW demonstrating slow labored movement and requiring verbal/tactile cues for foot/walker placement and reaching for chair armrests before sitting with fair carryover, limited mostly due to generalized weakness and fatigue. Patient requiring significant verbal/tactile cueing and AAROM to complete seated LE exercises due to poor attention to task. Patient tolerated sitting up in chair after therapy with sats between 95-98% during session with activty and at rest, patient left on room air. Patient will benefit from continued skilled physical therapy in hospital and recommended venue below to increase strength, balance, endurance for safe ADLs and gait.    Recommendations for follow up therapy are one component of a multi-disciplinary discharge planning process, led by the attending physician.  Recommendations may be updated based on patient status, additional functional criteria and insurance authorization.  Follow Up Recommendations  Skilled nursing-short term rehab (<3 hours/day) Can patient physically be transported by private vehicle: No    Assistance Recommended at Discharge Intermittent Supervision/Assistance  Patient can return home with the following A lot of help with bathing/dressing/bathroom;A lot of help with walking and/or transfers;Assistance with cooking/housework;Help with stairs or ramp for entrance   Equipment Recommendations  None recommended by PT    Recommendations for Other Services       Precautions / Restrictions Precautions Precautions: Fall Restrictions Weight Bearing Restrictions: No     Mobility  Bed Mobility Overal bed mobility: Needs Assistance Bed Mobility: Supine to Sit     Supine to sit: Mod assist, HOB elevated     General bed mobility comments: patient requiring mod assist and HOB elevated ~55 degrees for sitting up at bedside to move legs off EOB and scoot to EOB, able to follow verbal cues better today    Transfers Overall transfer level: Needs assistance Equipment used: Rolling walker (2 wheels) Transfers: Sit to/from Stand, Bed to chair/wheelchair/BSC Sit to Stand: Min assist, Mod assist   Step pivot transfers: Mod assist       General transfer comment: patient able to stand with min-mod assist boost, able to transfer to chair with mod assist/RW demonstrating slow labored movement and requiring verbal/tactile cues for walker/foot placement and ensuring pt is behind chair before sitting. Patient also cued to reach for arm rests before sitting with poor carryover    Ambulation/Gait Ambulation/Gait assistance: Mod assist Gait Distance (Feet): 3 Feet Assistive device: Rolling walker (2 wheels) Gait Pattern/deviations: Decreased step length - right, Decreased step length - left, Decreased stride length, Narrow base of support Gait velocity: slow     General Gait Details: patient with slowed labored cadence, limited to a few side steps at bedside due to generalized weakness and fatigue   Stairs  Wheelchair Mobility    Modified Rankin (Stroke  Patients Only)       Balance Overall balance assessment: Needs assistance Sitting-balance support: Bilateral upper extremity supported, Feet supported Sitting balance-Leahy Scale: Fair Sitting balance - Comments: pt now able to maintain seated balance w/o therapist support   Standing balance support: During functional activity, Reliant on assistive device for balance, Bilateral upper extremity supported Standing balance-Leahy Scale: Fair Standing balance comment: poor/fair with RW                            Cognition Arousal/Alertness: Awake/alert Behavior During Therapy: WFL for tasks assessed/performed Overall Cognitive Status: Within Functional Limits for tasks assessed                                 General Comments: patient moree awake and responsive today, verbal although speaks in a low tone, follows commands        Exercises General Exercises - Lower Extremity Long Arc Quad: Seated, AAROM, Strengthening, Both, 5 reps Hip Flexion/Marching: Seated, AAROM, Strengthening, Both, 5 reps Toe Raises: Seated, AROM, Strengthening, Both, 5 reps Heel Raises: AAROM, Seated, Both, 5 reps, Strengthening    General Comments        Pertinent Vitals/Pain Pain Assessment Pain Assessment: No/denies pain    Home Living                          Prior Function            PT Goals (current goals can now be found in the care plan section) Acute Rehab PT Goals Patient Stated Goal: return to LTC SNF PT Goal Formulation: With patient Time For Goal Achievement: 07/26/22 Potential to Achieve Goals: Good Progress towards PT goals: Progressing toward goals    Frequency    Min 3X/week      PT Plan Current plan remains appropriate    Co-evaluation              AM-PAC PT "6 Clicks" Mobility   Outcome Measure  Help needed turning from your back to your side while in a flat bed without using bedrails?: A Little Help needed moving  from lying on your back to sitting on the side of a flat bed without using bedrails?: A Lot Help needed moving to and from a bed to a chair (including a wheelchair)?: A Lot Help needed standing up from a chair using your arms (e.g., wheelchair or bedside chair)?: A Lot Help needed to walk in hospital room?: A Lot Help needed climbing 3-5 steps with a railing? : Total 6 Click Score: 12    End of Session Equipment Utilized During Treatment: Oxygen Activity Tolerance: Patient tolerated treatment well;Patient limited by fatigue Patient left: in chair;with call bell/phone within reach Nurse Communication: Mobility status PT Visit Diagnosis: Unsteadiness on feet (R26.81);Other abnormalities of gait and mobility (R26.89);Muscle weakness (generalized) (M62.81)     Time: 3818-2993 PT Time Calculation (min) (ACUTE ONLY): 27 min  Charges:  $Therapeutic Exercise: 8-22 mins $Therapeutic Activity: 8-22 mins                     Zigmund Gottron, SPT

## 2022-07-12 NOTE — Plan of Care (Signed)
  Problem: Acute Rehab OT Goals (only OT should resolve) Goal: Pt. Will Perform Eating Flowsheets (Taken 07/12/2022 0949) Pt Will Perform Eating:  with modified independence  sitting Goal: Pt. Will Perform Grooming Flowsheets (Taken 07/12/2022 0949) Pt Will Perform Grooming:  with min assist  sitting Goal: Pt. Will Perform Upper Body Bathing Flowsheets (Taken 07/12/2022 0949) Pt Will Perform Upper Body Bathing:  with min guard assist  sitting Goal: Pt. Will Perform Lower Body Bathing Flowsheets (Taken 07/12/2022 0949) Pt Will Perform Lower Body Bathing:  with mod assist  sitting/lateral leans Goal: Pt. Will Perform Upper Body Dressing Flowsheets (Taken 07/12/2022 0949) Pt Will Perform Upper Body Dressing:  with min guard assist  sitting Goal: Pt. Will Perform Lower Body Dressing Flowsheets (Taken 07/12/2022 0949) Pt Will Perform Lower Body Dressing:  with mod assist  sitting/lateral leans Goal: Pt. Will Transfer To Toilet [place] (Taken 07/12/2022 [phone]) Pt Will Transfer to Toilet:  with min guard assist  stand pivot transfer Goal: Pt/Caregiver Will Perform Home Exercise Program Flowsheets (Taken 07/12/2022 0949) Pt/caregiver will Perform Home Exercise Program:  Increased ROM  Increased strength  Both right and left upper extremity  Independently  Amel Gianino OT, MOT

## 2022-07-12 NOTE — TOC Progression Note (Signed)
Transition of Care [facility]) - Progression Note    Patient Details  Name: Oscar Rose MRN: [id number] Date of Birth: 20-Aug-1960  Transition of Care [facility]) CM/SW Contact  [name], [place] Phone Number: 07/12/2022, 9:51 AM  Clinical Narrative: Per MD, anticipate d/c Monday. SNF updated. TOC will continue to follow.       Expected Discharge Plan: Long Term Nursing Home Barriers to Discharge: Continued Medical Work up  Expected Discharge Plan and Services Expected Discharge Plan: [place] In-house Referral: Clinical Social Work Discharge Planning Services: CM Consult Post Acute Care Choice: Nursing Home Living arrangements for the past 2 months: [place]                                       Social Determinants of Health (SDOH) Interventions    Readmission Risk Interventions    06/30/2022   10:23 AM  Readmission Risk Prevention Plan  Transportation Screening Complete  Medication Review Press photographer) Complete  HRI or Home Care Consult Complete  SW Recovery Care/Counseling Consult Complete  Palliative Care Screening Not Applicable  Skilled Nursing Facility Complete

## 2022-07-13 DIAGNOSIS — J9601 Acute respiratory failure with hypoxia: Secondary | ICD-10-CM | POA: Diagnosis not present

## 2022-07-13 LAB — BASIC METABOLIC PANEL
Anion gap: 8 (ref 5–15)
BUN: 81 mg/dL — ABNORMAL HIGH (ref 6–20)
CO2: 26 mmol/L (ref 22–32)
Calcium: 8.9 mg/dL (ref 8.9–10.3)
Chloride: 108 mmol/L (ref 98–111)
Creatinine, Ser: 2.44 mg/dL — ABNORMAL HIGH (ref 0.61–1.24)
GFR, Estimated: 30 mL/min — ABNORMAL LOW (ref >60)
Glucose, Bld: 205 mg/dL — ABNORMAL HIGH (ref 70–99)
Potassium: 3.6 mmol/L (ref 3.5–5.1)
Sodium: 142 mmol/L (ref 135–145)

## 2022-07-13 LAB — CBC
HCT: 30 % — ABNORMAL LOW (ref 39.0–52.0)
Hemoglobin: 9.2 g/dL — ABNORMAL LOW (ref 13.0–17.0)
MCH: 28.1 pg (ref 26.0–34.0)
MCHC: 30.7 g/dL (ref 30.0–36.0)
MCV: 91.7 fL (ref 80.0–100.0)
Platelets: 322 10*3/uL (ref 150–400)
RBC: 3.27 MIL/uL — ABNORMAL LOW (ref 4.22–5.81)
RDW: 13.7 % (ref 11.5–15.5)
WBC: 10.6 10*3/uL — ABNORMAL HIGH (ref 4.0–10.5)
nRBC: 0 % (ref 0.0–0.2)

## 2022-07-13 LAB — GLUCOSE, CAPILLARY
Glucose-Capillary: 158 mg/dL — ABNORMAL HIGH (ref 70–99)
Glucose-Capillary: 168 mg/dL — ABNORMAL HIGH (ref 70–99)
Glucose-Capillary: 174 mg/dL — ABNORMAL HIGH (ref 70–99)
Glucose-Capillary: 184 mg/dL — ABNORMAL HIGH (ref 70–99)
Glucose-Capillary: 196 mg/dL — ABNORMAL HIGH (ref 70–99)
Glucose-Capillary: 235 mg/dL — ABNORMAL HIGH (ref 70–99)

## 2022-07-13 MED ORDER — ENOXAPARIN SODIUM 60 MG/0.6ML IJ SOSY
50.0000 mg | PREFILLED_SYRINGE | INTRAMUSCULAR | Status: DC
  Administered 2022-07-13 – 2022-07-14 (×2): 50 mg via SUBCUTANEOUS
  Filled 2022-07-13: qty 0.6

## 2022-07-13 MED ORDER — ENOXAPARIN SODIUM 60 MG/0.6ML IJ SOSY
50.0000 mg | PREFILLED_SYRINGE | INTRAMUSCULAR | Status: DC
  Filled 2022-07-13: qty 0.6

## 2022-07-13 NOTE — NC FL2 (Signed)
[place] LEVEL OF CARE FORM     IDENTIFICATION  Patient Name: Oscar Rose Birthdate: 04-07-1961 Sex: male Admission Date (Current Location): 06/29/2022  [place] and [place] Number:  Kathleen Argue [id number] [facility] and Address:  [facility] [address] [place]      Provider Number: [id number]  Attending Physician Name and Address:  Deatra James, MD  Relative Name and Phone Number:  Eula Listen [phone]    Current Level of Care: Hospital Recommended Level of Care: [place] Prior Approval Number:    Date Approved/Denied:   PASRR Number: [id number]  Discharge Plan: SNF    Current Diagnoses: Patient Active Problem List   Diagnosis Date Noted   Influenza and pneumonia 07/01/2022   Acute respiratory failure with hypoxia ([place]) 06/29/2022   Acute on chronic systolic CHF (congestive heart failure) ([place]) 06/29/2022   Influenza A with pneumonia 06/29/2022   Elevated troponin 06/29/2022   ARDS (adult respiratory distress syndrome) ([place]) 06/29/2022   HCAP (healthcare-associated pneumonia) 05/14/2022   AKI (acute kidney injury) superimposed on CKD 3B 05/14/2022   CKD (chronic kidney disease) stage 3B 05/14/2022   Acute hypoxemic respiratory failure due to bilateral multifocal pneumonia 05/12/2022   Acute hypoxic respiratory failure due to bilateral multifocal pneumonia 05/12/2022   BPPV (benign paroxysmal positional vertigo) 04/30/2017   Carotid aneurysm, right ([place]) 04/04/2016   Cerebral infarction due to unspecified mechanism    Essential hypertension    CVA (cerebral infarction) 02/13/2016   Dehydration 02/13/2016   Fall at home 02/13/2016   Hyponatremia 02/13/2016   Encephalopathy 02/13/2016   Hypertension    Type 2 diabetes mellitus ([place])    Legal blindness of left eye, as defined in [place]    CKD (chronic kidney disease), stage II    Renal stone 08/16/2014   Precordial pain 03/23/2014    Impingement syndrome of shoulder 02/02/2013    Orientation RESPIRATION BLADDER Height & Weight     Self, Time  O2 Incontinent, Indwelling catheter Weight: 233 lb 4 oz (105.8 kg) Height:  (P) 5\' 11"  (180.3 cm)  BEHAVIORAL SYMPTOMS/MOOD NEUROLOGICAL BOWEL NUTRITION STATUS      Incontinent Diet (see discharge summary)  AMBULATORY STATUS COMMUNICATION OF NEEDS Skin   Total Care Verbally Normal                       Personal Care Assistance Level of Assistance  Bathing, Feeding, Dressing, Total care Bathing Assistance: Maximum assistance Feeding assistance: Limited assistance Dressing Assistance: Maximum assistance Total Care Assistance: Maximum assistance   Functional Limitations Info  Sight, Hearing, Speech Sight Info: Adequate Hearing Info: Adequate Speech Info: Adequate    SPECIAL CARE FACTORS FREQUENCY  PT (By licensed PT), OT (By licensed OT)     PT Frequency: 5x week OT Frequency: 5x week            Contractures Contractures Info: Not present    Additional Factors Info  Code Status, Allergies, Insulin Sliding Scale Code Status Info: full Allergies Info: NKA   Insulin Sliding Scale Info: Novolog: see discharge summary       Current Medications (07/13/2022):  This is the current hospital active medication list Current Facility-Administered Medications  Medication Dose Route Frequency Provider Last Rate Last Admin   0.9 %  sodium chloride infusion  250 mL Intravenous PRN Barton Dubois, MD 10 mL/hr at 07/13/22 0800 Infusion Verify at 07/13/22 0800   acetaminophen (TYLENOL) 160 MG/5ML solution 650 mg  650 mg Oral Q6H PRN Barton Dubois, MD   650 mg at 07/01/22 2458   Or   acetaminophen (TYLENOL) suppository 650 mg  650 mg Rectal Q6H PRN Barton Dubois, MD       aspirin chewable tablet 81 mg  81 mg Oral Daily Emokpae, Courage, MD   81 mg at 07/13/22 0959   atorvastatin (LIPITOR) tablet 40 mg  40 mg Oral Daily Emokpae, Courage, MD   40 mg at 07/13/22 0998    Chlorhexidine Gluconate Cloth 2 % PADS 6 each  6 each Topical Daily Barton Dubois, MD   6 each at 07/13/22 1002   cloNIDine (CATAPRES) tablet 0.2 mg  0.2 mg Oral TID Skipper Cliche A, MD   0.2 mg at 07/13/22 0959   clopidogrel (PLAVIX) tablet 75 mg  75 mg Oral Daily Emokpae, Courage, MD   75 mg at 07/13/22 0959   docusate sodium (COLACE) capsule 200 mg  200 mg Oral BID Madueme, Elvira C, RPH   200 mg at 07/13/22 0958   enoxaparin (LOVENOX) injection 60 mg  60 mg Subcutaneous Q24H Madueme, Elvira C, RPH   60 mg at 07/12/22 1332   furosemide (LASIX) injection 20 mg  20 mg Intravenous Q12H Shahmehdi, Seyed A, MD   20 mg at 07/13/22 0558   glycopyrrolate (ROBINUL) tablet 1 mg  1 mg Oral TID Skipper Cliche A, MD   1 mg at 07/13/22 0959   insulin aspart (novoLOG) injection 0-15 Units  0-15 Units Subcutaneous Q4H Anders Simmonds, MD   3 Units at 07/13/22 1214   insulin glargine-yfgn (SEMGLEE) injection 10 Units  10 Units Subcutaneous QHS Shahmehdi, Seyed A, MD   10 Units at 07/12/22 2250   ipratropium-albuterol (DUONEB) 0.5-2.5 (3) MG/3ML nebulizer solution 3 mL  3 mL Nebulization Q4H PRN Barton Dubois, MD   3 mL at 07/10/22 0205   ipratropium-albuterol (DUONEB) 0.5-2.5 (3) MG/3ML nebulizer solution 3 mL  3 mL Nebulization BID Emokpae, Courage, MD   3 mL at 07/13/22 0815   labetalol (NORMODYNE) injection 10 mg  10 mg Intravenous Q4H PRN Shahmehdi, Erling Conte A, MD   10 mg at 07/12/22 0349   labetalol (NORMODYNE) tablet 200 mg  200 mg Oral BID Shahmehdi, Erling Conte A, MD   200 mg at 07/13/22 0958   minoxidil (LONITEN) tablet 5 mg  5 mg Oral BID Tanda Rockers, MD   5 mg at 07/13/22 0959   ondansetron (ZOFRAN) tablet 4 mg  4 mg Per Tube Q6H PRN Barton Dubois, MD       Or   ondansetron [facility]) injection 4 mg  4 mg Intravenous Q6H PRN Barton Dubois, MD   4 mg at 07/07/22 3382   Oral care mouth rinse  15 mL Mouth Rinse PRN Barton Dubois, MD       pantoprazole (PROTONIX) EC tablet 40 mg  40 mg Oral QAC  breakfast Tanda Rockers, MD   40 mg at 07/13/22 0846   polyethylene glycol (MIRALAX / GLYCOLAX) packet 17 g  17 g Per Tube Daily Barton Dubois, MD   17 g at 07/13/22 1000   predniSONE (DELTASONE) tablet 10 mg  10 mg Oral Q breakfast Tanda Rockers, MD   10 mg at 07/13/22 0846   sodium chloride flush (NS) 0.9 % injection 3 mL  3 mL Intravenous Q12H Barton Dubois, MD   3 mL at 07/13/22 1002   sodium chloride flush (NS) 0.9 % injection 3 mL  3 mL Intravenous  PRN Barton Dubois, MD       tamsulosin [facility]) capsule 0.4 mg  0.4 mg Oral Daily Shahmehdi, Seyed A, MD   0.4 mg at 07/13/22 0630     Discharge Medications: Please see discharge summary for a list of discharge medications.  Relevant Imaging Results:  Relevant Lab Results:   Additional Information SSN: [id number]  Joanne Chars, LCSW

## 2022-07-13 NOTE — Progress Notes (Signed)
Asked patient to work on Incentive, he refused did not wish to do. He didn't care !!!

## 2022-07-13 NOTE — Progress Notes (Signed)
Progress Note   [name] DOB: 11/28/1960 DOA: 06/29/2022     14 DOS: the patient was seen and examined on 07/13/2022    Subjective: 07/11/2022 [name] was seen and examined this morning, more lethargic, but still awake, following commands Foley catheter in place, clear urine noted -Blood pressure respiratory status is improved    -Next of kin--Ms April Brame-- [phone] updated 12/09    Brief hospital course: Oscar Rose is a 61 y.o. male with medical history significant of type 2 diabetes with nephropathy, gastroesophageal reflux disease, hypertension, hyperlipidemia, chronic kidney disease stage IIIb, chronic respiratory failure, sleep apnea, class II obesity and recent hospitalization with multifocal pneumonia; presented from his extended care facility secondary to worsening shortness of breath and hypoxia.  On arrival to the emergency department patient condition further deteriorates and he was found to be obtunded with oxygen saturation in the mid 60s.  EMS personnel transported him providing bagging with bag-valve-mask.  Patient was intubated, mechanically ventilated and sedated.  At time of my evaluation unable to answer any questions or provide any history. Per EDP notes, EMS reports and information from his facility no chest pain, no nausea, no vomiting, no dysuria, no melena, no hematochezia, no focal deficits or any other complaints.   Workup in the ED demonstrating vascular congestion, pulmonary edema with concern for ARDS and multifocal pneumonia.  Patient COVID PCR was negative but he was found to be positive for influenza A.  TRH has been consulted to place patient in the hospital for further evaluation and management.  07/03/22  -Dr. Halford Chessman recommends transfer to [facility] ICU with pulmonary critical care service managing patient/primary team -Patient with significant increased oxygen requirement, more difficult to ventilate over the  past couple of days and demonstrating worsening renal function with marginal output while receiving diuresis.   -Patient is currently awaiting ICU bed at [facility] for further critical care management. - 07/04/22 Failed sedation vacation,  failed weaning trial  07/05/22  -Patient did poorly with sedation vacation and weaning trial-- Patient weaned on 10/5 with 40% from 1322 to 1455. At 1455 desaturation to 86%,1500 BP 221/82 patient was returned to rest mode of PRVC 26/ 600/ 80%/ PEEP 5. He was unable to maintain an adequate SpO2 on 40% at that time.,  FiO2 was increased to 80% temporarily  07/06/22 -Switched from IV propofol to Precedex--did better with weaning trial (almost 3 hrs) on Precedex  07/07/22 -Did not do well with pressure support/weaning trial--- lasted less than 2 hours -Back on vent -ET tube advanced  -07/09/22 -Intubated 06/29/2022 -Extubated 07/09/2022... Currently on nasal cannula at 5 L -May use BiPAP as needed and nightly  07/10/2222 -The patient was seen and examined, remains extubated, on 3 L of oxygen satting 99% -Still lethargic, but follows commands  07/11/2022 -Hemodynamically stable, mentation improving -Foley catheter was placed for urinary retention  07/12/2022 -Late yesterday evening and overnight patient had urinary retention,  -Hypertensive - Foley catheter had to be reinserted -Much more awake alert, following commands-speech slow muffled -Tolerating p.o.  07/13/2022 -Lethargic today, with following command, only drinks would not have solid food -Unable to adequately use the flutter valve and spirometer -Foley catheter still in place, clear urine noted  -Blood pressures improved with adjusted meds  ---------------------------------------------------------------------------------------------------------------------------------------------  Assessment and Plan:  1)Acute respiratory failure with hypoxia/influenza A and HCAP -Continue to  improve, remains on 2 L of oxygen, satting 98% -    -Acute respiratory  failure with hypoxia secondary to pulmonary edema/acute on chronic systolic heart failure, influenza  A and multifocal pneumonia. - completed Tamiflu as ordered (5 day course started on 06/29/2022) Completed vanco,cefepime/Zosyn and azithro---last dose 07/06/2022 for HCAP protocol -Treated with Solu-Medrol, transitioned to prednisone -Continue diuresis with IV Lasix -holding as patient is becoming dehydrated -Continue bronchodilators -Pulmonary critical care consult from Dr. Halford Chessman and Dr Melvyn Novas >>>   -07/09/22 -Intubated 06/29/2022 -Extubated 07/09/2022...  -As needed BiPAP  2)HCAP (healthcare-associated pneumonia) --Management as above #1 Blood cultures from 06/29/2022 with PROPIONIBACTERIUM ACNES  Suspect this is a contaminant-- -completed antibiotics as above #1  3)AKI----acute kidney injury on CKD stage -IV -Creatinine was 2.2 on 11/08/2021, creatinine was 2.8 on 05/15/2022 Lab Results  Component Value Date   CREATININE 2.44 (H) 07/13/2022   CREATININE 2.42 (H) 07/12/2022   CREATININE 2.42 (H) 07/11/2022    -Creatinine continues to improve after peaking at 3.66  -renally adjust medications, avoid nephrotoxic agents / dehydration  / hypotension  4) hyperkalemia- -Resolved with interventions  5) acute on chronic anemia--Hgb is down to 8.6 from 10.2 on admission -Baseline hemoglobin usually around 9 -No obvious bleeding at this time continue to monitor   6)ARDS (adult respiratory distress syndrome) (HCC) -Much improved -Continue ARDS protocol and his ventilatory settings - PCCM recommendations by Dr. Deland Pretty appreciated -Please see #1 above  7)Elevated Troponin -Appears to be demand ischemia in the setting of hypoxic respiratory failure -No ischemic changes appreciated on EKG and telementry -Echo from 05/13/2022 with EF of 55 to 60%, still showed wall motion abnormalities, with hypokinesis of the  left ventricular, apical anterior inferior wall and lateral as well as inferior lateral and apical segments -No mitral stenosis or aortic stenosis -Continue treatment with Lipitor, aspirin and Plavix  8)Influenza A with pneumonia Completed Tamiflu as above #1 -Improved  9)Acute on chronic systolic CHF (congestive heart failure) (HCC) -Elevated BNP --Continue IV diuresis -Echo as above #7  10)Type 2 diabetes mellitus ([place]) -With nephropathy; chronic kidney disease a stage IV -A1c 5.5 reflecting excellent diabetic control PTA -Suspect steroid-induced hyperglycemia  - insulin regimen--Lantus and NovoLog adjusted  Titrating as p.o. intake improving Use Novolog/Humalog Sliding scale insulin with Accu-Cheks/Fingersticks as ordered   11)Hypertension -Blood pressure elevated -continue amlodipine, hydralazine, clonidine, added Labetalol  -may use IV labetalol as needed elevated BP -  12)FEN--tube feeding stopped after extubation on 07/09/2022 -Status post speech evaluation, still recommending dysphagia 3 solids, nectar thick liquid diet  Liquid Administration via: Cup;Spoon    Medication Administration: Whole meds with puree  Compensations: Slow rate;Small sips/bites  Postural Changes: Seated upright at 90 degrees  Oral Care Recommendations: Oral care BID  Other Recommendations: Remove water pitcher;Prohibited food (jello, ice cream, thin soups)     13)Social/Ethics---no living Blood  rel[place] (his blood relatives live [place]),  Ms April Brame-- [phone]---is his Primary contact (she is the sister of Mr. Kilfoyle's ex Celesta Gentile who died in 2015/11/05) -Ms. 06-Dec-2022 is to present to the hospital with assistive social worker to become POA  -Prophylaxis Continue Protonix for GI prophylaxis -Continue Lovenox for DVT prophylaxis  14) urinary retention -Foley catheter was replaced on 07/11/2022 monitoring I's and O's     Physical Exam: Vitals:   07/13/22 0800 07/13/22  0827 07/13/22 0900 07/13/22 1000  BP: (!) 145/61  (!) 143/48 (!) 134/53  Pulse: 92  89 84  Resp: (!) 21  (!) 21 (!) 22  Temp:  98.5 F (36.9 C)  TempSrc:  Oral    SpO2: 98%  98% 98%  Weight:      Height:        General:  AAO x 3,  cooperative, no distress;   HEENT:  Normocephalic, PERRL, otherwise with in Normal limits   Neuro:  CNII-XII intact. , normal motor and sensation, reflexes intact   Lungs:   Clear to auscultation BL, Respirations unlabored,  No wheezes / crackles  Cardio:    S1/S2, RRR, No murmure, No Rubs or Gallops   Abdomen:  Soft, non-tender, bowel sounds active all four quadrants, no guarding or peritoneal signs.  Muscular  skeletal:  Limited exam -global generalized weaknesses - in bed, able to move all 4 extremities,   2+ pulses,  symmetric, No pitting edema  Skin:  Dry, warm to touch, negative for any Rashes,  Wounds: Please see nursing documentation  Pressure Injury 07/05/22 Coccyx Medial Stage 2 -  Partial thickness loss of dermis presenting as a shallow open injury with a red, pink wound bed without slough. (Active)  07/05/22 1130  Location: Coccyx  Location Orientation: Medial  Staging: Stage 2 -  Partial thickness loss of dermis presenting as a shallow open injury with a red, pink wound bed without slough.  Wound Description (Comments):   Present on Admission: No  Dressing Type Foam - Lift dressing to assess site every shift 07/13/22 0800    -MSK-prior right big toe amputation, prior left transmetatarsal amputation GU-Foley with clear urine   Family Communication: Updated Ms April Brame-- [phone]---she is his Primary contact (she is the sister of Mr. Wichert's ex Celesta Gentile who died in 2015-11-17)  Disposition: -Possibly discharge back to [facility] when medically stable patient/primary team Status is: Inpatient Remains inpatient appropriate because: Continue treatment for ARDS, multifocal pneumonia and pulmonary edema due to acute on chronic  systolic heart failure.   Planned Discharge Destination: Skilled nursing facility... Currently Not stable for discharge   Author: Deatra James, MD 07/13/2022 11:29 AM  For on call review [email or web address].    CRITICAL CARE Performed by: Deatra James  Total critical care time: 56 minutes Critical care time was exclusive of separately billable procedures and treating other patients. Critical care was necessary to treat or prevent imminent or life-threatening deterioration. - Extubated 07/09/2022---currently on nasal cannula at 5 L >>> 2  L now   Critical care was time spent personally by me on the following activities: development of treatment plan with patient and/or surrogate as well as nursing, discussions with consultants, evaluation of patient's response to treatment, examination of patient, obtaining history from patient or surrogate, ordering and performing treatments and interventions, ordering and review of laboratory studies, ordering and review of radiographic studies, pulse oximetry and re-evaluation of patient's condition

## 2022-07-14 DIAGNOSIS — J9601 Acute respiratory failure with hypoxia: Secondary | ICD-10-CM | POA: Diagnosis not present

## 2022-07-14 LAB — BASIC METABOLIC PANEL
Anion gap: 9 (ref 5–15)
BUN: 75 mg/dL — ABNORMAL HIGH (ref 6–20)
CO2: 27 mmol/L (ref 22–32)
Calcium: 8.6 mg/dL — ABNORMAL LOW (ref 8.9–10.3)
Chloride: 101 mmol/L (ref 98–111)
Creatinine, Ser: 2.44 mg/dL — ABNORMAL HIGH (ref 0.61–1.24)
GFR, Estimated: 30 mL/min — ABNORMAL LOW (ref >60)
Glucose, Bld: 180 mg/dL — ABNORMAL HIGH (ref 70–99)
Potassium: 3.5 mmol/L (ref 3.5–5.1)
Sodium: 137 mmol/L (ref 135–145)

## 2022-07-14 LAB — CBC
HCT: 26.7 % — ABNORMAL LOW (ref 39.0–52.0)
Hemoglobin: 8.5 g/dL — ABNORMAL LOW (ref 13.0–17.0)
MCH: 28.7 pg (ref 26.0–34.0)
MCHC: 31.8 g/dL (ref 30.0–36.0)
MCV: 90.2 fL (ref 80.0–100.0)
Platelets: 279 10*3/uL (ref 150–400)
RBC: 2.96 MIL/uL — ABNORMAL LOW (ref 4.22–5.81)
RDW: 13.7 % (ref 11.5–15.5)
WBC: 6.6 10*3/uL (ref 4.0–10.5)
nRBC: 0 % (ref 0.0–0.2)

## 2022-07-14 LAB — GLUCOSE, CAPILLARY
Glucose-Capillary: 163 mg/dL — ABNORMAL HIGH (ref 70–99)
Glucose-Capillary: 126 mg/dL — ABNORMAL HIGH (ref 70–99)
Glucose-Capillary: 140 mg/dL — ABNORMAL HIGH (ref 70–99)
Glucose-Capillary: 143 mg/dL — ABNORMAL HIGH (ref 70–99)
Glucose-Capillary: 78 mg/dL (ref 70–99)

## 2022-07-14 MED ORDER — ALPRAZOLAM 1 MG PO TABS
1.0000 mg | ORAL_TABLET | Freq: Three times a day (TID) | ORAL | Status: DC | PRN
  Administered 2022-07-14: 1 mg via ORAL
  Filled 2022-07-14 (×2): qty 2

## 2022-07-14 MED ORDER — CLONIDINE HCL 0.2 MG/24HR TD PTWK
0.3000 mg | MEDICATED_PATCH | TRANSDERMAL | Status: DC
  Administered 2022-07-14: 0.3 mg via TRANSDERMAL
  Filled 2022-07-14: qty 1

## 2022-07-14 MED ORDER — FUROSEMIDE 40 MG PO TABS
40.0000 mg | ORAL_TABLET | Freq: Every day | ORAL | Status: DC
  Filled 2022-07-14: qty 1

## 2022-07-14 MED ORDER — PREDNISONE 5 MG PO TABS
5.0000 mg | ORAL_TABLET | Freq: Every day | ORAL | Status: DC
  Filled 2022-07-14 (×2): qty 1

## 2022-07-14 NOTE — Progress Notes (Signed)
Patient refused nebulizer treatment and CPT. Patient also refused RT to place pulse oximeter on finger for Spo2 monitoring.

## 2022-07-14 NOTE — Progress Notes (Signed)
Patient continue to refuse meals, beverages, and all medications, including the blood pressure meds. His blood pressure is now 186/72. He do not have any IV PRN B/p meds. Patient may need a psych eval. In addition, patient has not voided since the foley was removed at 8am this morning 07/14/2022. Patient is refusing the bladder scan as well. Safety maintained, call bell within reach, and honey thickened water kept at the bedside. Provider made aware.

## 2022-07-14 NOTE — Progress Notes (Signed)
Patient allowed RN to apply catapress patch, and administer injections after established boundaries. Patient also walked in the room to bedside commode and couch with walker and staff. Patient did consume 4 cup of thickened water and iced tea. Patient is now in bed resting. Call bell with reach. Personal items within reach.

## 2022-07-14 NOTE — Progress Notes (Signed)
Pt still refuses all medications for tonight. Tried redirecting, indications and risks of medicines being missed explained but still refusing. MD informed. Will keep monitoring. Vitals stable for now and pt resting comfortably.

## 2022-07-14 NOTE — Progress Notes (Signed)
Progress Note   [name] JGG:[id number] DOB: 06/04/1961 DOA: 06/29/2022     15 DOS: the patient was seen and examined on 07/14/2022    Subjective: 07/11/2022 Oscar Rose was seen and examined this morning, sitting on side of bed, agitated, requesting to be discharged home.  He has been refusing his medication, breathing treatments.    -Next of kin--Ms Oscar Rose-- [phone] updated 12/09    Brief hospital course: Oscar Rose is a 61 y.o. male with medical history significant of type 2 diabetes with nephropathy, gastroesophageal reflux disease, hypertension, hyperlipidemia, chronic kidney disease stage IIIb, chronic respiratory failure, sleep apnea, class II obesity and recent hospitalization with multifocal pneumonia; presented from his extended care facility secondary to worsening shortness of breath and hypoxia.  On arrival to the emergency department patient condition further deteriorates and he was found to be obtunded with oxygen saturation in the mid 60s.  EMS personnel transported him providing bagging with bag-valve-mask.  Patient was intubated, mechanically ventilated and sedated.  At time of my evaluation unable to answer any questions or provide any history. Per EDP notes, EMS reports and information from his facility no chest pain, no nausea, no vomiting, no dysuria, no melena, no hematochezia, no focal deficits or any other complaints.   Workup in the ED demonstrating vascular congestion, pulmonary edema with concern for ARDS and multifocal pneumonia.  Patient COVID PCR was negative but he was found to be positive for influenza A.  TRH has been consulted to place patient in the hospital for further evaluation and management.  07/03/22  -Dr. Halford Rose recommends transfer to [facility] ICU with pulmonary critical care service managing patient/primary team -Patient with significant increased oxygen requirement, more difficult to ventilate over the past couple  of days and demonstrating worsening renal function with marginal output while receiving diuresis.   -Patient is currently awaiting ICU bed at [facility] for further critical care management. - 07/04/22 Failed sedation vacation,  failed weaning trial  07/05/22  -Patient did poorly with sedation vacation and weaning trial-- Patient weaned on 10/5 with 40% from 1322 to 1455. At 1455 desaturation to 86%,1500 BP 221/82 patient was returned to rest mode of PRVC 26/ 600/ 80%/ PEEP 5. He was unable to maintain an adequate SpO2 on 40% at that time.,  FiO2 was increased to 80% temporarily  07/06/22 -Switched from IV propofol to Precedex--did better with weaning trial (almost 3 hrs) on Precedex  07/07/22 -Did not do well with pressure support/weaning trial--- lasted less than 2 hours -Back on vent -ET tube advanced  -07/09/22 -Intubated 06/29/2022 -Extubated 07/09/2022... Currently on nasal cannula at 5 L -May use BiPAP as needed and nightly  07/10/2222 -The patient was seen and examined, remains extubated, on 3 L of oxygen satting 99% -Still lethargic, but follows commands  07/11/2022 -Hemodynamically stable, mentation improving -Foley catheter was placed for urinary retention  07/12/2022 -Late yesterday evening and overnight patient had urinary retention,  -Hypertensive - Foley catheter had to be reinserted -Much more awake alert, following commands-speech slow muffled -Tolerating p.o.  07/13/2022 -Lethargic today, with following command, only drinks would not have solid food -Unable to adequately use the flutter valve and spirometer -Foley catheter still in place, clear urine noted  -Blood pressures improved with adjusted meds    07/14/2022 -Patient was seen and examined, hemodynamically stable, Agitated requesting Foley catheter be discontinued, refusing medication breathing treatments Requesting to be  discharged ---------------------------------------------------------------------------------------------------------------------------------------------  Assessment and Plan:  1)Acute  respiratory failure with hypoxia/influenza A and HCAP -Much improved remained stable on 2 L of oxygen satting 90%   -Acute respiratory failure with hypoxia secondary to pulmonary edema/acute on chronic systolic heart failure, influenza  A and multifocal pneumonia. - completed Tamiflu as ordered (5 day course started on 06/29/2022) Completed vanco,cefepime/Zosyn and azithro---last dose 07/06/2022 for HCAP protocol -Treated with Solu-Medrol, transitioned to prednisone -Continue diuresis with IV Lasix -holding as patient is becoming dehydrated -Continue bronchodilators -Pulmonary critical care consult from Dr. Halford Rose and Dr Oscar Rose >>>   -07/09/22 -Intubated 06/29/2022 -Extubated 07/09/2022...  -As needed BiPAP  2)HCAP (healthcare-associated pneumonia) --Management as above #1 Blood cultures from 06/29/2022 with PROPIONIBACTERIUM ACNES  Suspect this is a contaminant-- -completed antibiotics as above #1  3)AKI----acute kidney injury on CKD stage -IV -Creatinine was 2.2 on 11/08/2021, creatinine was 2.8 on 05/15/2022 Lab Results  Component Value Date   CREATININE 2.44 (H) 07/14/2022   CREATININE 2.44 (H) 07/13/2022   CREATININE 2.42 (H) 07/12/2022    -Creatinine continues to improve after peaking at 3.66  -renally adjust medications, avoid nephrotoxic agents / dehydration  / hypotension  4) hyperkalemia- -Resolved with interventions  5) acute on chronic anemia--Hgb is down to 8.6 from 10.2 on admission -Baseline hemoglobin usually around 9 -No obvious bleeding at this time continue to monitor   6)ARDS (adult respiratory distress syndrome) (HCC) -Much improved -Continue ARDS protocol and his ventilatory settings - PCCM recommendations by Dr. Deland Rose appreciated -Please see #1 above  7)Elevated  Troponin -Appears to be demand ischemia in the setting of hypoxic respiratory failure -No ischemic changes appreciated on EKG and telementry -Echo from 05/13/2022 with EF of 55 to 60%, still showed wall motion abnormalities, with hypokinesis of the left ventricular, apical anterior inferior wall and lateral as well as inferior lateral and apical segments -No mitral stenosis or aortic stenosis -Continue treatment with Lipitor, aspirin and Plavix  8)Influenza A with pneumonia -Improved  9)Acute on chronic systolic CHF (congestive heart failure) (HCC) -Elevated BNP --Continue IV diuresis -Echo as above #7  10)Type 2 diabetes mellitus ([name]) -With nephropathy; chronic kidney disease a stage IV -A1c 5.5 reflecting excellent diabetic control PTA -Suspect steroid-induced hyperglycemia  - insulin regimen--Lantus and NovoLog adjusted  Titrating as p.o. intake improving Use Novolog/Humalog Sliding scale insulin with Accu-Cheks/Fingersticks as ordered   11)Hypertension -Blood pressure elevated -continue amlodipine, hydralazine, clonidine, added Labetalol  -may use IV labetalol as needed elevated BP   12)FEN--tube feeding stopped after extubation on 07/09/2022 -Status post speech evaluation, still recommending dysphagia 3 solids, nectar thick liquid diet  Liquid Administration via: Cup;Spoon    Medication Administration: Whole meds with puree  Compensations: Slow rate;Small sips/bites  Postural Changes: Seated upright at 90 degrees  Oral Care Recommendations: Oral care BID  Other Recommendations: Remove water pitcher;Prohibited food (jello, ice cream, thin soups)     13)Social/Ethics---no living Blood  relatives here in [place] (his blood relatives live [place]),  Ms Oscar Rose-- [phone]---is his Primary contact (she is the sister of Mr. Wormley's ex Celesta Gentile who died in 30-Oct-2015) -Ms. 11-30-22 is to present to the hospital with assistive social worker to become  POA  -Prophylaxis Continue Protonix for GI prophylaxis -Continue Lovenox for DVT prophylaxis  14) urinary retention -Foley catheter was replaced on 07/11/2022 monitoring I's and O's >> Foley catheter discontinued 07/14/2022     Physical Exam: Vitals:   07/14/22 0600 07/14/22 0700 07/14/22 0730 07/14/22 0800  BP: (!) 180/71 (!) 145/58  (!) 167/61  Pulse:  78 67    Resp: 19 18  (!) 21  Temp:   98.4 F (36.9 C)   TempSrc:   Axillary   SpO2: 99% 99%    Weight:      Height:        General:  AAO x 2, agitated not cooperative today  HEENT:  Normocephalic, PERRL, otherwise with in Normal limits   Neuro:  CNII-XII intact. , normal motor and sensation, reflexes intact   Lungs:   Clear to auscultation BL, Respirations unlabored,  No wheezes / crackles  Cardio:    S1/S2, RRR, No murmure, No Rubs or Gallops   Abdomen:  Soft, non-tender, bowel sounds active all four quadrants, no guarding or peritoneal signs.  Muscular  skeletal:  Limited exam - sever global generalized weaknesses - in bed, able to move all 4 extremities,   2+ pulses,  symmetric, No pitting edema  Skin:  Dry, warm to touch, negative for any Rashes,  Wounds: Please see nursing documentation  Pressure Injury 07/05/22 Coccyx Medial Stage 2 -  Partial thickness loss of dermis presenting as a shallow open injury with a red, pink wound bed without slough. (Active)  07/05/22 1130  Location: Coccyx  Location Orientation: Medial  Staging: Stage 2 -  Partial thickness loss of dermis presenting as a shallow open injury with a red, pink wound bed without slough.  Wound Description (Comments):   Present on Admission: No  Dressing Type Foam - Lift dressing to assess site every shift 07/13/22 2000       -MSK-prior right big toe amputation, prior left transmetatarsal amputation GU-Foley with clear urine   Family Communication: Updated Ms Oscar Rose-- [phone]---she is his Primary contact (she is the sister of Mr. Droke's  ex Celesta Gentile who died in 11/18/15)  Disposition: -Possibly discharge back to [facility] when medically stable patient/primary team Status is: Inpatient Remains inpatient appropriate because: Continue treatment for ARDS, multifocal pneumonia and pulmonary edema due to acute on chronic systolic heart failure.   Planned Discharge Destination: Skilled nursing facility... Currently Not stable for discharge   Author: Deatra James, MD 07/14/2022 9:45 AM  For on call review [email or web address].    CRITICAL CARE Performed by: Deatra James  Total care time: 56 minutes  - Extubated 07/09/2022---currently on nasal cannula at 5 L >>> 2  L now   Time spent personally by me on the following activities: development of treatment plan with patient and/or surrogate as well as nursing, discussions with consultants, evaluation of patient's response to treatment, examination of patient, obtaining history from patient or surrogate, ordering and performing treatments and interventions, ordering and review of laboratory studies, ordering and review of radiographic studies, pulse oximetry and re-evaluation of patient's condition

## 2022-07-14 NOTE — Progress Notes (Signed)
Patient refused CPT x 2 today. Patient asleep at this time. CPT not done at this time.

## 2022-07-14 NOTE — Progress Notes (Signed)
Patient refusing all medications, food, and fluids and care this morning. Provider in at the bedside and attempted to redirect and educate patient on the importance of consuming the medication. Patient dangle on the side of the bed and allowed RN to wash his back, arms, and legs with CHG wipes. Called friend April on facetime and she also attempted to redirect patient but was unsuccessful. Patient did allow RN to remove the foley and is adamantly trying to leave and return back to the SNF where he resides. Safety maintained, patient assisted back in the bed. Bed alarm on and functioning. Call bell kept within reach.

## 2022-07-14 NOTE — Progress Notes (Signed)
Pt was refusing all meds tonight including insulin. Encouraged and talked with the pt and explained risks and benefits of all meds. Another RN and nurse tech tried convincing the pt too to no avail. Informed MD. Pt vitals stable at the moment. Pt sleeping soundly. Will keep monitoring.

## 2022-07-15 DIAGNOSIS — Z7189 Other specified counseling: Secondary | ICD-10-CM | POA: Diagnosis not present

## 2022-07-15 DIAGNOSIS — Z515 Encounter for palliative care: Secondary | ICD-10-CM | POA: Diagnosis not present

## 2022-07-15 DIAGNOSIS — J189 Pneumonia, unspecified organism: Secondary | ICD-10-CM | POA: Diagnosis not present

## 2022-07-15 DIAGNOSIS — J9601 Acute respiratory failure with hypoxia: Secondary | ICD-10-CM | POA: Diagnosis not present

## 2022-07-15 DIAGNOSIS — R451 Restlessness and agitation: Secondary | ICD-10-CM

## 2022-07-15 LAB — GLUCOSE, CAPILLARY
Glucose-Capillary: 102 mg/dL — ABNORMAL HIGH (ref 70–99)
Glucose-Capillary: 103 mg/dL — ABNORMAL HIGH (ref 70–99)
Glucose-Capillary: 106 mg/dL — ABNORMAL HIGH (ref 70–99)
Glucose-Capillary: 124 mg/dL — ABNORMAL HIGH (ref 70–99)
Glucose-Capillary: 164 mg/dL — ABNORMAL HIGH (ref 70–99)
Glucose-Capillary: 92 mg/dL (ref 70–99)

## 2022-07-15 MED ORDER — BUSPIRONE HCL 5 MG PO TABS
7.5000 mg | ORAL_TABLET | Freq: Two times a day (BID) | ORAL | Status: DC
  Filled 2022-07-15 (×2): qty 2

## 2022-07-15 MED ORDER — HYDROXYZINE HCL 25 MG PO TABS: 25.0000 mg | ORAL_TABLET | Freq: Three times a day (TID) | ORAL | Status: DC | PRN

## 2022-07-15 MED ORDER — SERTRALINE HCL 50 MG PO TABS
25.0000 mg | ORAL_TABLET | Freq: Every day | ORAL | Status: DC
  Filled 2022-07-15 (×2): qty 1

## 2022-07-15 MED ORDER — TRAZODONE HCL 50 MG PO TABS: 50.0000 mg | ORAL_TABLET | Freq: Every day | ORAL | Status: DC

## 2022-07-15 MED ORDER — HALOPERIDOL LACTATE 5 MG/ML IJ SOLN: 5.0000 mg | Freq: Two times a day (BID) | INTRAMUSCULAR | Status: DC | PRN

## 2022-07-15 NOTE — Progress Notes (Signed)
Patient transferred from ICU. Patient is noncompliant. Patient refusing admission/transfer assessment. Patient refused his blood glucose check. Patient refusing ALL nighttime medications. Patient stated that everyone is lying to him and that he just wants to go back to [facility]. Attending MD Mansy made aware. Will continue to monitor.

## 2022-07-15 NOTE — Progress Notes (Signed)
Pt refusing breathing tx and CPT this morning.

## 2022-07-15 NOTE — Progress Notes (Signed)
Pt is refusing an EKG this morning.

## 2022-07-15 NOTE — Progress Notes (Signed)
Had anther nurse attempt to do EKG and give 10am medications. Patient continues to refuse medications but did allow the EKG to be done. Dr Joesph Fillers made aware.

## 2022-07-15 NOTE — Progress Notes (Addendum)
Patient refused ALL PO meds and Lovenox today. Writer continued to educate and encourage patient to take medications. Dr Joesph Fillers aware of all refusals. Bladder scan done at 1459 with result of 965ml. Patient refused at that time to get up to go to the bathroom and refused any in and out cath and/or foley cath to be done. Dr Joesph Fillers made aware. Patient did, however get up out of bed at 1745 with assist and walker and walked to the commode and urinated. Patient then sat on patient room couch with chair alarm under him to eat his dinner. Patient had refused breakfast and lunch. Patient ate some of his dinner and drank some fluids. Patient refusing thickened liquids and insistent to get some ginger ale. Dr Joesph Fillers is aware.

## 2022-07-15 NOTE — Progress Notes (Signed)
Progress Note   [name] AYT:[id number] DOB: [birth year] DOA: 06/29/2022     16 DOS: the patient was seen and examined on 07/15/2022    Subjective:    07/15/22 disorientation persist, refusing medications, refusing Rx, refusing labs and refusing food and refusing care,  -Called and updated patient next of kin   Brief hospital course: KAESEN RODRIGUEZ is a 61 y.o. male with medical history significant of type 2 diabetes with nephropathy, gastroesophageal reflux disease, hypertension, hyperlipidemia, chronic kidney disease stage IIIb, chronic respiratory failure, sleep apnea, class II obesity and recent hospitalization with multifocal pneumonia; presented from his extended care facility secondary to worsening shortness of breath and hypoxia.  On arrival to the emergency department patient condition further deteriorates and he was found to be obtunded with oxygen saturation in the mid 60s.  EMS personnel transported him providing bagging with bag-valve-mask.  Patient was intubated, mechanically ventilated and sedated.  At time of my evaluation unable to answer any questions or provide any history. Per EDP notes, EMS reports and information from his facility no chest pain, no nausea, no vomiting, no dysuria, no melena, no hematochezia, no focal deficits or any other complaints.   Workup in the ED demonstrating vascular congestion, pulmonary edema with concern for ARDS and multifocal pneumonia.  Patient COVID PCR was negative but he was found to be positive for influenza A.  TRH has been consulted to place patient in the hospital for further evaluation and management.  07/03/22  -Dr. Halford Chessman recommends transfer to [facility] ICU with pulmonary critical care service managing patient/primary team -Patient with significant increased oxygen requirement, more difficult to ventilate over the past couple of days and demonstrating worsening renal function with marginal output while receiving  diuresis.   -Patient is currently awaiting ICU bed at [facility] for further critical care management. - 07/04/22 Failed sedation vacation,  failed weaning trial  07/05/22  -Patient did poorly with sedation vacation and weaning trial-- Patient weaned on 10/5 with 40% from 1322 to 1455. At 1455 desaturation to 86%,1500 BP 221/82 patient was returned to rest mode of PRVC 26/ 600/ 80%/ PEEP 5. He was unable to maintain an adequate SpO2 on 40% at that time.,  FiO2 was increased to 80% temporarily  07/06/22 -Switched from IV propofol to Precedex--did better with weaning trial (almost 3 hrs) on Precedex  07/07/22 -Did not do well with pressure support/weaning trial--- lasted less than 2 hours -Back on vent -ET tube advanced  -07/09/22 -Intubated 06/29/2022 -Extubated 07/09/2022... Currently on nasal cannula at 5 L -May use BiPAP as needed and nightly  07/10/2222 -The patient was seen and examined, remains extubated, on 3 L of oxygen satting 99% -Still lethargic, but follows commands  07/11/2022 -Hemodynamically stable, mentation improving -Foley catheter was placed for urinary retention  07/12/2022 -Late yesterday evening and overnight patient had urinary retention,  -Hypertensive - Foley catheter had to be reinserted -Much more awake alert, following commands-speech slow muffled -Tolerating p.o.  07/13/2022 -Lethargic today, with following command, only drinks would not have solid food -Unable to adequately use the flutter valve and spirometer -Foley catheter still in place, clear urine noted  -Blood pressures improved with adjusted meds    07/14/2022 -Patient was seen and examined, hemodynamically stable, Agitated requesting Foley catheter be discontinued, refusing medication breathing treatments Requesting to be  discharged ---------------------------------------------------------------------------------------------------------------------------------------------  Assessment and Plan:  1)Acute respiratory failure with hypoxia/influenza A and HCAP -Much improved remained stable on 2 L of oxygen   -  Acute respiratory failure with hypoxia secondary to pulmonary edema/acute on chronic systolic heart failure, influenza  A and multifocal pneumonia. - completed Tamiflu as ordered (5 day course started on 06/29/2022) Completed vanco,cefepime/Zosyn and azithro---last dose 07/06/2022 for HCAP protocol -Treated with Solu-Medrol, transitioned to prednisone--continue prednisone taper -IV Lasix changed to p.o. Lasix -Continue bronchodilators -Pulmonary critical care consult from Dr. Halford Chessman and Dr Melvyn Novas >>>  -Intubated 06/29/2022 -Extubated 07/09/2022...  -As needed BiPAP -  2)HCAP (healthcare-associated pneumonia) --Management as above #1 Blood cultures from 06/29/2022 with PROPIONIBACTERIUM ACNES  Suspect this is a contaminant-- -completed antibiotics as above #1  3)AKI----acute kidney injury on CKD stage -IV -Creatinine was 2.2 on 11/08/2021, creatinine was 2.8 on 05/15/2022 Lab Results  Component Value Date   CREATININE 2.44 (H) 07/14/2022   CREATININE 2.44 (H) 07/13/2022   CREATININE 2.42 (H) 07/12/2022   -Creatinine continues to improve after peaking at 3.66 -renally adjust medications, avoid nephrotoxic agents / dehydration  / hypotension  4)Social/Ethics---no living Blood  relatives here [place] (his blood relatives live [place]),  Ms April Brame-- [phone]---is his Primary contact (she is the sister of Mr. Botsford's ex Fiancee who died in 2015-11-01) 07-27-22 I Called and updated Ms April Brame---she is currently having some car/transport Problems and unable to come to the hospital to complete POA paperwork with chaplain/social worker at this time  5)Acute on chronic anemia--Hgb is down to 8.6 from  10.2 on admission -Baseline hemoglobin usually around 9 -No obvious bleeding at this time continue to monitor  6)Behavioral concerns-patient with disorientation , refusing medications, refusing Rx, refusing labs and refusing food and refusing care,  -Get psychiatric/TTS consult - 7)Elevated Troponin -Appears to be demand ischemia in the setting of hypoxic respiratory failure -No ischemic changes appreciated on EKG and telementry -Echo from 05/13/2022 with EF of 55 to 60%, still showed wall motion abnormalities, with hypokinesis of the left ventricular, apical anterior inferior wall and lateral as well as inferior lateral and apical segments -No mitral stenosis or aortic stenosis -Continue treatment with Lipitor, aspirin and Plavix  8)Influenza A with pneumonia -Symptomatology resolved  9)Acute on chronic systolic CHF (congestive heart failure) (HCC) -Elevated BNP --Continue lasix -Echo as above #7  10)Type 2 diabetes mellitus ([place]) -With nephropathy; chronic kidney disease a stage IV -A1c 5.5 reflecting excellent diabetic control PTA -c/n --Lantus Use Novolog/Humalog Sliding scale insulin with Accu-Cheks/Fingersticks as ordered   11)Hypertension -Blood pressure elevated -continue minoxidil, hydralazine and  Labetalol  -may use IV labetalol as needed elevated BP   12)FEN--tube feeding stopped after extubation on 07/09/2022 -Status post speech evaluation, still recommending dysphagia 3 solids, nectar thick liquid diet  Liquid Administration via: Cup;Spoon    Medication Administration: Whole meds with puree  Compensations: Slow rate;Small sips/bites  Postural Changes: Seated upright at 90 degrees  Oral Care Recommendations: Oral care BID  Other Recommendations: Remove water pitcher;Prohibited food (jello, ice cream, thin soups)    -Prophylaxis Continue Protonix for GI prophylaxis -Continue Lovenox for DVT prophylaxis  14)Acute  urinary retention -Foley catheter was  replaced on 07/11/2022 monitoring I's and O's >> Foley catheter discontinued 07/14/2022 07-27-2022 -Refusing Flomax, may need indwelling Foley if continues to refuse Flomax and continues to have problems with urination  Physical Exam: Vitals:   Jul 27, 2022 0959 2022/07/27 1000 July 27, 2022 1001 July 27, 2022 1002  BP:  (!) 160/68    Pulse:  79    Resp:  15    Temp:      TempSrc:      SpO2:  90%  95% 96%  Weight:      Height:        Gen:-Appears comfortable,  not cooperative HEENT:- [place].AT, No sclera icterus- Lungs--fair air movement, no wheezing, No increased work of breathing CV- S1, S2 normal, irregular,  Abd-  +ve B.Sounds, Abd Soft, ND Extremity/Skin:- +ve  edema,   good pedal pulses  Psych--disorientation persist, refusing medications, refusing Rx, refusing labs and refusing food and refusing care,  Neuro---Gen weakness, No additional New Focal deficits -MSK-prior right big toe amputation, prior left transmetatarsal amputation   Wounds: Please see nursing documentation  Pressure Injury 07/05/22 Coccyx Medial Stage 2 -  Partial thickness loss of dermis presenting as a shallow open injury with a red, pink wound bed without slough. (Active)  07/05/22 1130  Location: Coccyx  Location Orientation: Medial  Staging: Stage 2 -  Partial thickness loss of dermis presenting as a shallow open injury with a red, pink wound bed without slough.  Wound Description (Comments):   Present on Admission: No  Dressing Type Foam - Lift dressing to assess site every shift 07/15/22 0800      -MSK-prior right big toe amputation, prior left transmetatarsal amputation GU-Foley with clear urine   Family Communication: Updated Ms April Brame-- [phone]---she is his Primary contact (she is the sister of Mr. Wyche's ex Celesta Gentile who died in 07-Nov-2015)  Disposition: -Possibly discharge back to [facility] when medically stable patient/primary team Status is: Inpatient Remains inpatient appropriate because:  Continue treatment for ARDS, multifocal pneumonia and pulmonary edema due to acute on chronic systolic heart failure.  Planned Discharge Destination: Skilled nursing facility... Currently Not stable for discharge  Author: Roxan Hockey, MD 07/15/2022 12:22 PM  For on call review [email or web address].

## 2022-07-15 NOTE — Progress Notes (Addendum)
Nutrition Follow-up  DOCUMENTATION CODES:   Not applicable  INTERVENTION:  When/if pt is cleared for thin liquids -start Ensure Plus High Protein po TID   Will add Magic cups with meals  NUTRITION DIAGNOSIS:   Inadequate oral intake related to inability to eat as evidenced by refusing meals. -ongoing   GOAL:   Patient will meet greater than or equal to 90% of their needs -not met  MONITOR:   Po intake, Labs, Weight trends, I & O's   ASSESSMENT:  61 year old male who presented to the ED on 11/25 after being found unresponsive at facility. PMH of T2DM with nephropathy, GERD, HTN, HLD, CKD stage IIIb, chronic respiratory failure, sleep apnea. Noted pt with recent hospitalization for multifocal PNA. Pt required intubation. Pt admitted with acute respiratory failure secondary to pulmonary edema/acute on chronic systolic HF, influenza.  Patient has been refusing care, meals, medications, labs per nursing.  Patient last BM 12/6. Meal intake (12/7) 25-75%. Able to feed himself at baseline. Inadequate oral intake currently given his limited acceptance of nutrition. Encourage oral supplements while his appetite is poor. At risk for malnutrition.   Patient speaking in a whisper. He calls his meals "diet slop". Patient just shakes his head no when asked about food likes/dislikes. He has a Librarian, academic on tray table which is partially (50%) consumed.   Medications reviewed.      Latest Ref Rng & Units 07/14/2022    3:45 AM 07/13/2022    5:53 AM 07/12/2022    4:14 AM  BMP  Glucose 70 - 99 mg/dL 180  205  172   BUN 6 - 20 mg/dL 75  81  92   Creatinine 0.61 - 1.24 mg/dL 2.44  2.44  2.42   Sodium 135 - 145 mmol/L 137  142  149   Potassium 3.5 - 5.1 mmol/L 3.5  3.6  4.0   Chloride 98 - 111 mmol/L 101  108  113   CO2 22 - 32 mmol/L _0 Calcium 8.9 - 10.3 mg/dL 8.6  8.9  9.4     Diet Order:   Diet Order             DIET DYS 3 Room service appropriate? Yes; Fluid consistency:  Nectar Thick  Diet effective now                   EDUCATION NEEDS:   No education needs have been identified at this time  Skin:  Skin Assessment: Reviewed RN Assessment Stage 2 coccyx   Last BM:  12/6  Height:   Ht Readings from Last 1 Encounters:  07/09/22 (P) _1  (1.803 m)    Weight:   Wt Readings from Last 1 Encounters:  07/14/22 107.7 kg    Ideal Body Weight:  78.2 kg  BMI:  Body mass index is 33.12 kg/m (pended).  Estimated Nutritional Needs:   Kcal:  2100-2300  Protein:  110-130 grams  Fluid:  >2.0 L   Colman Cater MS,RD,CSG,LDN Contact: AMION

## 2022-07-15 NOTE — Consult Note (Signed)
Telepsych Consultation   Reason for Consult:  Behavioral disturbance Referring Physician: Dr. Roger Shelter Location of Patient: IC05-01 Location of Provider: [place] Department  Patient Identification: Oscar Rose MRN:  [id number] Principal Diagnosis: Acute respiratory failure with hypoxia [facility]) Diagnosis:  Principal Problem:   Acute respiratory failure with hypoxia ([place]) Active Problems:   Hypertension   Type 2 diabetes mellitus ([place])   HCAP (healthcare-associated pneumonia)   CKD (chronic kidney disease) stage 3B   Acute on chronic systolic CHF (congestive heart failure) (HCC)   Influenza A with pneumonia   Elevated troponin   ARDS (adult respiratory distress syndrome) ([place])   Influenza and pneumonia  Total Time spent with patient: 45 minutes  Subjective:   Oscar Rose is a 61 y.o. male patient admitted with multifocal pneumonia; presented from his extended care facility secondary to worsening shortness of breath, hypoxia and behavioral disturbance.  Assessment: Patient seen and examined via telepsych laying in a private room at [facility], ICU.  Patient appears aggressive, irritated and not wanting to be bothered.  Reported in whispers, I want to go back to [facility].  Added, "this is same old shit."  Chart reviewed and findings shared with the treatment team and discussed with Dr. Dwyane Dee.  Alert and oriented x 3, speaking in whispers with occasional short voice sounds.  Body and hand movement appears aggressive and irritated.  See Psych Consult Plan of Care with new meds ordered.  Presents with angry, anxious, irritated and dysphoric mood.  Affect is congruent and blunted.  Stated, "I do not want any medication, I want to go back to [facility]."  Thought process linear, thought content with obsession of going back to previous facility.  Patient does not seem to be responding to internal or external stimuli. No signs of psychotic episode.  Labs and vital signs  reviewed with critical values.  CMP: Glucose 180 high, BUN 75 high, creatinine 2.44 high, calcium 8.6 low, GFR 30 low.  CBC: RBC 2.96 low, hemoglobin 8.5 low, hematocrit 26.7 low. Blood pressure 160/68, and pulse 79. ICU physicians managing patient critical lab values and comorbid conditions.  Patient denies SI, HI, or AVH.  He denies access to firearms and reported being safe at [facility].  He further denies drug use alcohol use or tobacco use and denies having any family.  Collaborative information: Attempted to reach April Brame at [phone] patient's friend listed on his chart.  Patient states "no", that he does not want anyone to be called.  Added, "I do not have any family."  HPI:  Oscar Rose is a 61 y.o. male with medical history significant of type 2 diabetes with nephropathy, gastroesophageal reflux disease, hypertension, hyperlipidemia, chronic kidney disease stage IIIb, chronic respiratory failure, sleep apnea, class II obesity and recent hospitalization with multifocal pneumonia; presented from his extended care facility secondary to worsening shortness of breath and hypoxia.  On arrival to the emergency department patient condition further deteriorates and he was found to be obtunded with oxygen saturation in the mid 60s.  EMS personnel transported him providing bagging with bag-valve-mask.  Patient was intubated, mechanically ventilated and sedated.  At time of my evaluation unable to answer any questions or provide any history. Per EDP notes, EMS reports and information from his facility no chest pain, no nausea, no vomiting, no dysuria, no melena, no hematochezia, no focal deficits or any other complaints.  Disposition: Patient does not appear to be at imminent danger to  himself.  He does not meet the criteria for inpatient psychiatric placement.  Patient can be discharged back to [facility] when medically stable.  Forestine Na, ICU physician and ICU treatment team made aware of  patient disposition.  Past Psychiatric History: Depression  Risk to Self:  No Risk to Others: No  Prior Inpatient Therapy:  No Prior Outpatient Therapy:  No  Past Medical History:  Past Medical History:  Diagnosis Date  . Anemia   . Arthritis   . At risk for sleep apnea    STOP-BANG= 7    SENT TO PCP 06-29-2014  . CKD (chronic kidney disease), stage II    montitored by nephrologist  . Depression       . Diabetic neuropathy ([place])   . GERD (gastroesophageal reflux disease)   . Headache    SINUS  . History of kidney stones   . History of retinal detachment   . Hyperlipidemia   . Hypertension   . 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 Neuropathy   . PVD (peripheral vascular disease) ([place])   . Restless leg syndrome   . Retained ureteral stent    w/ encrustation SINCE 2012  . Rotator cuff syndrome of right shoulder   . Sleep apnea in adult   . Stroke [facility])    ?  STROKE  JULY  2017  . Type 2 diabetes mellitus ([place])   . Vitamin D deficiency     Past Surgical History:  Procedure Laterality Date  . ABDOMINAL AORTOGRAM W/LOWER EXTREMITY N/A 11/08/2021   Procedure: ABDOMINAL AORTOGRAM W/LOWER EXTREMITY;  Surgeon: Marty Heck, MD;  Location: [place] [facility];  Service: Cardiovascular;  Laterality: N/A;  . AMPUTATION Bilateral 2012   Left big toe partial and right big toe complete  . CARDIOVASCULAR STRESS TEST  04-05-2014  dr croitoru   low risk lexiscan nuclear study with mild diaphragmatic attenuation artifact/  no ischemia/  ef 58%  . CATARACT EXTRACTION W/ INTRAOCULAR LENS  IMPLANT, BILATERAL  2013  . CYSTO /  LEFT URETERAL STENT PLACEMENT  2012  . CYSTOSCOPY W/ URETERAL STENT REMOVAL Left 07/07/2014   Procedure: CYSTO WITH LEFT PORTION STENT REMOVAL;  Surgeon: Malka So, MD;  Location: [facility];  Service: Urology;  Laterality: Left;  . CYSTOSCOPY WITH URETEROSCOPY AND STENT PLACEMENT Left  07/07/2014   Procedure: CYSTOLITHALOPAXY URETEROSCOPY WITH STENT;  Surgeon: Malka So, MD;  Location[facility];  Service: Urology;  Laterality: Left;  . ENDARTERECTOMY Right 04/04/2016   Procedure: ENDARTERECTOMY CAROTID ARTERY RIGHT;  Surgeon: Angelia Mould, MD;  Location: [place];  Service: Vascular;  Laterality: Right;  . HOLMIUM LASER APPLICATION N/A 22/04/7988   Procedure: HOLMIUM LASER APPLICATION;  Surgeon: Malka So, MD;  Location: [facility];  Service: Urology;  Laterality: N/A;  . I & D  INFECTED SPIDER BITE UPPER BACK  06/ 2012  . NEPHROLITHOTOMY Left 08/16/2014   Procedure: LEFT NEPHROLITHOTOMY PERCUTANEOUS;  Surgeon: Malka So, MD;  Location: [facility];  Service: Urology;  Laterality: Left;  . NEPHROLITHOTOMY Left 08/18/2014   Procedure: LEFT NEPHROLITHOTOMY PERCUTANEOUS SECOND LOOK;  Surgeon: Malka So, MD;  Location: [facility];  Service: Urology;  Laterality: Left;  . PATCH ANGIOPLASTY Right 04/04/2016   Procedure: PATCH ANGIOPLASTY RIGHT CAROTID ARTERY;  Surgeon: Angelia Mould, MD;  Location: [place];  Service: Vascular;  Laterality: Right;  .  PERIPHERAL VASCULAR BALLOON ANGIOPLASTY  11/08/2021   Procedure: PERIPHERAL VASCULAR BALLOON ANGIOPLASTY;  Surgeon: Marty Heck, MD;  Location: [place] [facility];  Service: Cardiovascular;;  Left AT  . RETINAL DETACHMENT SURGERY Bilateral 2013  . TONSILLECTOMY AND ADENOIDECTOMY  as child   Family History:  Family History  Problem Relation Age of Onset  . Cancer Mother        Throat  . Stroke Neg Hx    Family Psychiatric  History: Patient states I do not have any family  Social History:  Social History   Substance and Sexual Activity  Alcohol Use Not Currently   Comment: Occassionally     Social History   Substance and Sexual Activity  Drug Use No    Social History   Socioeconomic History  . Marital status: Single    Spouse name: Not on file  . Number of children: 0   . Years of education: Not on file  . Highest education level: Not on file  Occupational History  . Not on file  Tobacco Use  . Smoking status: Never  . Smokeless tobacco: Never  Vaping Use  . Vaping Use: Never used  Substance and Sexual Activity  . Alcohol use: Not Currently    Comment: Occassionally  . Drug use: No  . Sexual activity: Not on file  Other Topics Concern  . Not on file  Social History Narrative   Lives with "wife"   Social Determinants of Health   Financial Resource Strain: Not on file  Food Insecurity: No Food Insecurity (05/13/2022)   Hunger Vital Sign   . Worried About Charity fundraiser in the Last Year: Never true   . Ran Out of Food in the Last Year: Never true  Transportation Needs: No Transportation Needs (05/13/2022)   PRAPARE - Transportation   . Lack of Transportation (Medical): No   . Lack of Transportation (Non-Medical): No  Physical Activity: Not on file  Stress: Not on file  Social Connections: Not on file   Additional Social History:    Allergies:  No Known Allergies  Labs:  Results for orders placed or performed during the hospital encounter of 06/29/22 (from the past 48 hour(s))  Glucose, capillary     Status: Abnormal   Collection Time: 07/13/22  4:52 PM  Result Value Ref Range   Glucose-Capillary 235 (H) 70 - 99 mg/dL    Comment: Glucose reference range applies only to samples taken after fasting for at least 8 hours.  Glucose, capillary     Status: Abnormal   Collection Time: 07/13/22  8:24 PM  Result Value Ref Range   Glucose-Capillary 168 (H) 70 - 99 mg/dL    Comment: Glucose reference range applies only to samples taken after fasting for at least 8 hours.  CBC     Status: Abnormal   Collection Time: 07/14/22  3:45 AM  Result Value Ref Range   WBC 6.6 4.0 - 10.5 K/uL   RBC 2.96 (L) 4.22 - 5.81 MIL/uL   Hemoglobin 8.5 (L) 13.0 - 17.0 g/dL   HCT 26.7 (L) 39.0 - 52.0 %   MCV 90.2 80.0 - 100.0 fL   MCH 28.7 26.0 - 34.0 pg    MCHC 31.8 30.0 - 36.0 g/dL   RDW 13.7 11.5 - 15.5 %   Platelets 279 150 - 400 K/uL   nRBC 0.0 0.0 - 0.2 %    Comment: Performed at [facility], [address], [place], [place]  [address]  Basic metabolic panel     Status: Abnormal   Collection Time: 07/14/22  3:45 AM  Result Value Ref Range   Sodium 137 135 - 145 mmol/L   Potassium 3.5 3.5 - 5.1 mmol/L   Chloride 101 98 - 111 mmol/L   CO2 27 22 - 32 mmol/L   Glucose, Bld 180 (H) 70 - 99 mg/dL    Comment: Glucose reference range applies only to samples taken after fasting for at least 8 hours.   BUN 75 (H) 6 - 20 mg/dL   Creatinine, Ser 2.44 (H) 0.61 - 1.24 mg/dL   Calcium 8.6 (L) 8.9 - 10.3 mg/dL   GFR, Estimated 30 (L) >60 mL/min    Comment: (NOTE) Calculated using the CKD-EPI Creatinine Equation (2021)    Anion gap 9 5 - 15    Comment: Performed at [facility], [address], [place], [place] [address]  Glucose, capillary     Status: Abnormal   Collection Time: 07/14/22  3:46 AM  Result Value Ref Range   Glucose-Capillary 163 (H) 70 - 99 mg/dL    Comment: Glucose reference range applies only to samples taken after fasting for at least 8 hours.  Glucose, capillary     Status: Abnormal   Collection Time: 07/14/22  7:47 AM  Result Value Ref Range   Glucose-Capillary 126 (H) 70 - 99 mg/dL    Comment: Glucose reference range applies only to samples taken after fasting for at least 8 hours.  Glucose, capillary     Status: Abnormal   Collection Time: 07/14/22 10:55 AM  Result Value Ref Range   Glucose-Capillary 140 (H) 70 - 99 mg/dL    Comment: Glucose reference range applies only to samples taken after fasting for at least 8 hours.  Glucose, capillary     Status: Abnormal   Collection Time: 07/14/22  3:55 PM  Result Value Ref Range   Glucose-Capillary 143 (H) 70 - 99 mg/dL    Comment: Glucose reference range applies only to samples taken after fasting for at least 8 hours.  Glucose, capillary     Status: None   Collection  Time: 07/14/22  8:29 PM  Result Value Ref Range   Glucose-Capillary 78 70 - 99 mg/dL    Comment: Glucose reference range applies only to samples taken after fasting for at least 8 hours.  Glucose, capillary     Status: None   Collection Time: 07/15/22 12:01 AM  Result Value Ref Range   Glucose-Capillary 92 70 - 99 mg/dL    Comment: Glucose reference range applies only to samples taken after fasting for at least 8 hours.  Glucose, capillary     Status: Abnormal   Collection Time: 07/15/22  5:02 AM  Result Value Ref Range   Glucose-Capillary 124 (H) 70 - 99 mg/dL    Comment: Glucose reference range applies only to samples taken after fasting for at least 8 hours.  Glucose, capillary     Status: Abnormal   Collection Time: 07/15/22  7:50 AM  Result Value Ref Range   Glucose-Capillary 106 (H) 70 - 99 mg/dL    Comment: Glucose reference range applies only to samples taken after fasting for at least 8 hours.  Glucose, capillary     Status: Abnormal   Collection Time: 07/15/22 11:48 AM  Result Value Ref Range   Glucose-Capillary 102 (H) 70 - 99 mg/dL    Comment: Glucose reference range applies only to samples taken after fasting for at  least 8 hours.    Medications:  Current Facility-Administered Medications  Medication Dose Route Frequency Provider Last Rate Last Admin  . 0.9 %  sodium chloride infusion  250 mL Intravenous PRN Barton Dubois, MD 10 mL/hr at 07/13/22 1800 Infusion Verify at 07/13/22 1800  . acetaminophen (TYLENOL) 160 MG/5ML solution 650 mg  650 mg Oral Q6H PRN Barton Dubois, MD   650 mg at 07/01/22 0948   Or  . acetaminophen (TYLENOL) suppository 650 mg  650 mg Rectal Q6H PRN Barton Dubois, MD      . ALPRAZolam Duanne Moron) tablet 1 mg  1 mg Oral TID PRN Skipper Cliche A, MD   1 mg at 07/14/22 1142  . aspirin chewable tablet 81 mg  81 mg Oral Daily Emokpae, Courage, MD   81 mg at 07/13/22 0959  . atorvastatin (LIPITOR) tablet 40 mg  40 mg Oral Daily Emokpae, Courage, MD    40 mg at 07/13/22 0958  . Chlorhexidine Gluconate Cloth 2 % PADS 6 each  6 each Topical Daily Barton Dubois, MD   6 each at 07/15/22 1022  . cloNIDine (CATAPRES - Dosed in mg/24 hr) patch 0.3 mg  0.3 mg Transdermal Weekly Shahmehdi, Seyed A, MD   0.3 mg at 07/14/22 1544  . clopidogrel (PLAVIX) tablet 75 mg  75 mg Oral Daily Emokpae, Courage, MD   75 mg at 07/13/22 0959  . docusate sodium (COLACE) capsule 200 mg  200 mg Oral BID Madueme, Elvira C, RPH   200 mg at 07/13/22 0958  . enoxaparin (LOVENOX) injection 50 mg  50 mg Subcutaneous Q24H Shahmehdi, Seyed A, MD   50 mg at 07/14/22 1544  . furosemide (LASIX) tablet 40 mg  40 mg Oral Daily Shahmehdi, Seyed A, MD      . glycopyrrolate (ROBINUL) tablet 1 mg  1 mg Oral TID Skipper Cliche A, MD   1 mg at 07/13/22 1712  . insulin aspart (novoLOG) injection 0-15 Units  0-15 Units Subcutaneous Q4H Anders Simmonds, MD   2 Units at 07/15/22 0500  . insulin glargine-yfgn (SEMGLEE) injection 10 Units  10 Units Subcutaneous QHS Skipper Cliche A, MD   10 Units at 07/12/22 2250  . ipratropium-albuterol (DUONEB) 0.5-2.5 (3) MG/3ML nebulizer solution 3 mL  3 mL Nebulization Q4H PRN Barton Dubois, MD   3 mL at 07/10/22 0205  . ipratropium-albuterol (DUONEB) 0.5-2.5 (3) MG/3ML nebulizer solution 3 mL  3 mL Nebulization BID Denton Brick, Courage, MD   3 mL at 07/13/22 2102  . labetalol (NORMODYNE) injection 10 mg  10 mg Intravenous Q4H PRN Shahmehdi, Seyed A, MD   10 mg at 07/14/22 1544  . labetalol (NORMODYNE) tablet 200 mg  200 mg Oral BID Shahmehdi, Seyed A, MD   200 mg at 07/13/22 0958  . minoxidil (LONITEN) tablet 5 mg  5 mg Oral BID Tanda Rockers, MD   5 mg at 07/13/22 0959  . ondansetron (ZOFRAN) tablet 4 mg  4 mg Per Tube Q6H PRN Barton Dubois, MD       Or  . ondansetron [facility]) injection 4 mg  4 mg Intravenous Q6H PRN Barton Dubois, MD   4 mg at 07/07/22 0806  . Oral care mouth rinse  15 mL Mouth Rinse PRN Barton Dubois, MD      . pantoprazole  (PROTONIX) EC tablet 40 mg  40 mg Oral QAC breakfast Tanda Rockers, MD   40 mg at 07/13/22 0846  . polyethylene glycol (MIRALAX / GLYCOLAX) packet  17 g  17 g Per Tube Daily Barton Dubois, MD   17 g at 07/13/22 1000  . predniSONE (DELTASONE) tablet 5 mg  5 mg Oral Q breakfast Shahmehdi, Seyed A, MD      . sodium chloride flush (NS) 0.9 % injection 3 mL  3 mL Intravenous Q12H Barton Dubois, MD   3 mL at 07/14/22 2300  . sodium chloride flush (NS) 0.9 % injection 3 mL  3 mL Intravenous PRN Barton Dubois, MD      . tamsulosin [facility]) capsule 0.4 mg  0.4 mg Oral Daily Shahmehdi, Seyed A, MD   0.4 mg at 07/13/22 3500    Musculoskeletal: Strength & Muscle Tone: within normal limits Gait & Station: normal Patient leans: N/A  Psychiatric Specialty Exam:  Presentation  General Appearance:  Appropriate for Environment; Casual  Eye Contact: Fair  Speech: Slow (Speaks in whispers and occasional coughing)  Speech Volume: Decreased (Whispers)  Handedness: Right  Mood and Affect  Mood: Angry; Anxious; Irritable; Dysphoric  Affect: Blunt  Thought Process  Thought Processes: Linear (Refusing to answer some questions.  Aggressive.  46 same old shit)  Descriptions of Associations:Intact  Orientation:Full (Time, Place and Person)  Thought Content:Obsessions (Patient states he wants to leave to [facility] skilled facility.)  History of Schizophrenia/Schizoaffective disorder:No data recorded Duration of Psychotic Symptoms:No data recorded Hallucinations:Hallucinations: None  Ideas of Reference:None  Suicidal Thoughts:Suicidal Thoughts: No  Homicidal Thoughts:Homicidal Thoughts: No  Sensorium  Memory: Immediate Fair; Recent Fair; Remote Fair  Judgment: Impaired  Insight: Shallow  Executive Functions  Concentration: Fair  Attention Span: Fair  Recall: [employer] of Knowledge: Fair  Language: Fair  Psychomotor Activity  Psychomotor  Activity: Psychomotor Activity: Restlessness  Assets  Assets: Resilience; Communication Skills (Patient states I do not have any family)  Sleep  Sleep: Sleep: Good Number of Hours of Sleep: 6  Physical Exam: Physical Exam Vitals and nursing note reviewed.   Review of Systems  Constitutional:  Negative for fever.  HENT: Negative.    Eyes: Negative.   Respiratory:  Positive for cough.   Cardiovascular: Negative.        Blood pressure 160/68, pulse 68.  Nursing staff to recheck vital signs  Gastrointestinal: Negative.   Genitourinary: Negative.   Musculoskeletal: Negative.   Skin: Negative.   Neurological: Negative.   Endo/Heme/Allergies: Negative.   Psychiatric/Behavioral:  The patient is nervous/anxious.    Blood pressure (!) 160/68, pulse 79, temperature 99.4 F (37.4 C), temperature source Axillary, resp. rate 15, height (P) 5\' 11"  (1.803 m), weight 107.7 kg, SpO2 96 %. Body mass index is 33.12 kg/m (pended).  Treatment Plan Summary: Daily contact with patient to assess and evaluate symptoms and progress in treatment and Medication management  Plan: Depression -- Initiate sertraline 25 mg p.o. daily -- Initiate buspirone BuSpar 7.5 mg p.o. twice daily  Anxiety: -- Continue Xanax 1 mg p.o. 3 times daily as needed -- Initiate hydroxyzine Atarax 25 mg p.o. 3 times daily as needed  Insomnia: -- Initiate trazodone Desyrel 50 mg p.o. q. nightly as needed  Disposition: No evidence of imminent risk to self or others at present.   Patient does not meet criteria for psychiatric inpatient admission. Supportive therapy provided about ongoing stressors. Discussed crisis plan, support from social network, calling 911, coming to the Emergency Department, and calling Suicide Hotline.  This service was provided via telemedicine using a 2-way, interactive audio and video technology.  Names of all persons participating in this  telemedicine service and their role in this  encounter. Name: [name] Role: Patient  Name: Garrison Columbus Role: NP provider  Name: Dr. Dwyane Dee Role: Medical Director  Name: Dr. Roger Shelter Role: Forestine Na, ED physician    Laretta Bolster, [place] 07/15/2022 12:03 PM

## 2022-07-15 NOTE — Progress Notes (Signed)
Pt also refuses lab works this morning. Tried encouraging him to no avail. MD notified. Still refusing medications all throughout the shift.

## 2022-07-15 NOTE — Consult Note (Signed)
Palliative Care Consult Note                                  Date: 07/15/2022   Patient Name: Oscar Rose  DOB: 1961-03-27  MRN: [id number]  Age / Sex: 61 y.o., male  PCP: Dione Housekeeper, MD Referring Physician: Roxan Hockey, MD  Reason for Consultation: Establishing goals of care  HPI/Patient Profile: 61 y.o. male  with past medical history of type 2 diabetes with nephropathy, gastroesophageal reflux disease, hypertension, hyperlipidemia, chronic kidney disease stage IIIb, chronic respiratory failure, sleep apnea, class II obesity and recent hospitalization with multifocal pneumonia; presented from his extended care facility secondary to worsening shortness of breath and hypoxia. He was admitted on 06/29/2022 with acute respiratory failure with hypoxia/influenza A and HCAP requiring intubation, AKI, and others.   After about 14 to 16 days of admission the patient began refusing all care.  PMT was consulted to evaluate GOC.  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  STROKE  JULY  2017   Type 2 diabetes mellitus (HCC)    Vitamin D deficiency     Subjective:   This NP Walden Field reviewed medical records, received report from team, assessed the  patient and then meet at the patient's bedside to discuss diagnosis, prognosis, GOC, EOL wishes disposition and options.  I met with the patient at the bedside.  He is essentially refusing substantial interaction.  I was only able to get him into answer a couple brief questions and for the majority my visit his eyes were closed and would not make eye contact, or speak to me.   Concept of Palliative Care was introduced as specialized medical care for people and their families living with serious illness.  If focuses on providing relief from the symptoms and stress of a serious illness.  The goal is to improve quality of life for both the patient and the family. Values and goals of care important to patient and family were attempted to be elicited.  Created space and opportunity for patient  and family to explore thoughts and feelings regarding current medical situation   Natural trajectory and current clinical status were discussed. Questions and concerns addressed. Patient  encouraged to call with questions or concerns.    Patient/Family Understanding of Illness: Deferred due to refusing conversation  Life Review: Deferred due to refusing conversation  Patient Values: Deferred due to refusing conversation  Goals: "I want to go back to [facility]!"  Today's Discussion: Again, the patient would not have substantial conversation with me or with the nurse.  He kept his eyes closed for the majority of her visit.  He is refusing his meds, EKG, blood pressures, vitals, to urinate.  Bladder scanner showed 900 mL in his bladder but he is refusing to allow a Foley or to urinate.  Urinary retention likely due to refusing Flomax.  I tried to bring up [place] as the nurse said he indicated he did not want to be intubated again.  He seems clear that he does not want intubation, CPR, defibrillation.  Essentially wants to be a DNR.  He states he signed papers to this effect at [facility].  However,  given his refusal to answer my questions I could not adequately establish capacity to make these decisions.  I reached out to both the hospitalist and to the psychiatric nurse practitioner who evaluated him and they both felt the same that he would not answer them enough for them to be able to confidently establish capacity.  At this time psych nurse practitioner recommended allowing another day to assess ending of ramifications of his decisions before making him a DNR.  Hospitalist was updated.  He seems quite angry and agitated.  He stating he wants to go back to [facility] and he will sign papers to leave.  We informed him that they are willing to accept him back but he needs to get better before he can discharge and he will get better by taking his medications.  He stated that we are only telling him this is to get him to take his meds.  I informed him I am available to come back and see him at any point that he wishes to speak with me and make further discussions about his goals of care.  Review of Systems  Unable to perform ROS: Other  (Patient refusing)  Objective:   Primary Diagnoses: Present on Admission:  Acute respiratory failure with hypoxia (HCC)  Hypertension  CKD (chronic kidney disease) stage 3B  Acute on chronic systolic CHF (congestive heart failure) (HCC)  Influenza A with pneumonia  Elevated troponin  HCAP (healthcare-associated pneumonia)   Physical Exam Vitals and nursing note reviewed.  Constitutional:      General: He is not in acute distress.    Appearance: He is ill-appearing.  HENT:     Head: Normocephalic and atraumatic.  Cardiovascular:     Rate and Rhythm: Normal rate and regular rhythm.  Pulmonary:     Effort: Pulmonary effort is normal.     Comments: Significant intermittent cough Skin:    General: Skin is warm and dry.  Neurological:     Mental Status: He is alert.  Psychiatric:        Mood and Affect: Affect is blunt and angry.         Behavior: Behavior is agitated and aggressive.     Comments: Seems somewhat paranoid that care team is lying to him to manipulate him     Vital Signs:  BP (!) 160/68   Pulse 85   Temp 98.4 F (36.9 C) (Oral)   Resp 15   Ht (P) 5' 11" (1.803 m)   Wt 107.7 kg   SpO2 94%   BMI (P) 33.12 kg/m   Palliative Assessment/Data: 30%    Advanced Care Planning:   Existing Vynca/ACP Documentation: None  Primary Decision Maker: PATIENT  Code Status/Advance Care Planning: Full code  A discussion was had  today regarding advanced directives. Concepts specific to code status, artifical feeding and hydration, continued IV antibiotics and rehospitalization was had.  The difference between a aggressive medical intervention path and a palliative comfort care path for this patient at this time was had.   Decisions/Changes to ACP: None today  Assessment & Plan:   Impression: 61 year old male with chronic conditions and acute illness as described above. He has been admitted for 16 days and was intubated for 11 days. Still high risk for decompensation. Currently refusing almost all care, agitated and angry. Wants to go back to [facility] but thinks we're not letting him. He states he wants to be a DNR, but cannot confirm capacity to make these decisions given refusal to answer questions of the care team. Overall prognosis poor.  SUMMARY OF RECOMMENDATIONS   Continue current scope of care Attempt to elicit capacity for decision-making over the next 24 hours If has capacity, ok to make DNR per patient wishes Attempt to get patient's primary contact deemed HCPOA per patient wishes PMT will follow from a distance given patient desire to not engage with our team Please notify us if patient would like to re-engage or if there's a significant clinical change  Symptom Management:  Per primary team PMT is available to assist as needed  Prognosis:  Unable to determine  Discharge Planning:  To  Be Determined   Discussed with: Patient, medical team, nursing team, [facility] team    Thank you for allowing [place] to participate in the care of [place] PMT will continue to support holistically.  Time Total: 90 min  Greater than 50%  of this time was spent counseling and coordinating care related to the above assessment and plan.  Signed by: Walden Field, NP Palliative Medicine Team  Team Phone # [phone] (Nights/Weekends)  07/15/2022, 4:02 PM

## 2022-07-15 NOTE — Progress Notes (Signed)
Patient refused midnight blood glucose check. Patient refused vital signs. Patient told [place] NT to "get the hell out of my room." MD Mansy made aware. Will continue to monitor.

## 2022-07-15 NOTE — TOC Progression Note (Signed)
Transition of Care [facility]) - Progression Note    Patient Details  Name: Oscar Rose MRN: [id number] Date of Birth: April 05, 1961  [facility] CM/SW Contact  Boneta Lucks, RN Phone Number: 07/15/2022, 4:15 PM  Clinical Narrative:   TOC confirmed with Melissa at [place] patient has a bed. He will be in the same room, his belongings are there. They have no issues taking him back. Melissa will be happy to talk with the patient. He is refusing everything and thinks he can not go back. Team updated.     Expected Discharge Plan: Long Term Nursing Home Barriers to Discharge: Continued Medical Work up  Expected Discharge Plan and Services Expected Discharge Plan: [place] In-house Referral: Clinical Social Work Discharge Planning Services: CM Consult Post Acute Care Choice: Nursing Home Living arrangements for the past 2 months: [place]                     Readmission Risk Interventions    06/30/2022   10:23 AM  Readmission Risk Prevention Plan  Transportation Screening Complete  Medication Review [employer] photographer) Complete  HRI or Home Care Consult Complete  SW Recovery Care/Counseling Consult Complete  Palliative Care Screening Not Applicable  Skilled Nursing Facility Complete

## 2022-07-15 NOTE — Progress Notes (Signed)
Patient alert, refusing to answer LOC questions, refusing 0800 medications x3. Bladder scan performed with result of >438ml. Patient refusing to use commode at this time and refusing straight cath to be done. Patient originally swung out at tech when she attempted to do temperature and CBG check. Patient did allow temp and CBG check to be done but would not let anything else be done. Dr Joesph Fillers made aware. EKG ordered but patient no allowing staff to perform. Dr Joesph Fillers made aware.

## 2022-07-16 DIAGNOSIS — J9601 Acute respiratory failure with hypoxia: Secondary | ICD-10-CM | POA: Diagnosis not present

## 2022-07-16 LAB — GLUCOSE, CAPILLARY: Glucose-Capillary: 103 mg/dL — ABNORMAL HIGH (ref 70–99)

## 2022-07-16 MED ORDER — ORAL CARE MOUTH RINSE: 15.0000 mL | OROMUCOSAL | 0 refills | Status: DC | PRN

## 2022-07-16 MED ORDER — HYDROXYZINE HCL 25 MG PO TABS: 25.0000 mg | ORAL_TABLET | Freq: Three times a day (TID) | ORAL | 0 refills | Status: DC | PRN

## 2022-07-16 MED ORDER — CLONIDINE 0.3 MG/24HR TD PTWK: 0.3000 mg | MEDICATED_PATCH | TRANSDERMAL | 12 refills | Status: AC

## 2022-07-16 MED ORDER — SERTRALINE HCL 25 MG PO TABS: 25.0000 mg | ORAL_TABLET | Freq: Every day | ORAL | 0 refills | Status: AC

## 2022-07-16 MED ORDER — FUROSEMIDE 40 MG PO TABS: 40.0000 mg | ORAL_TABLET | Freq: Every day | ORAL | 1 refills | Status: AC

## 2022-07-16 MED ORDER — IPRATROPIUM-ALBUTEROL 0.5-2.5 (3) MG/3ML IN SOLN: 3.0000 mL | RESPIRATORY_TRACT | 0 refills | Status: DC | PRN

## 2022-07-16 MED ORDER — BUSPIRONE HCL 7.5 MG PO TABS: 7.5000 mg | ORAL_TABLET | Freq: Two times a day (BID) | ORAL | 0 refills | Status: AC

## 2022-07-16 MED ORDER — LABETALOL HCL 200 MG PO TABS: 200.0000 mg | ORAL_TABLET | Freq: Two times a day (BID) | ORAL | 0 refills | Status: DC

## 2022-07-16 MED ORDER — INSULIN GLARGINE 100 UNIT/ML ~~LOC~~ SOLN: 20.0000 [IU] | Freq: Every day | SUBCUTANEOUS | 11 refills | Status: DC

## 2022-07-16 MED ORDER — INSULIN ASPART 100 UNIT/ML IJ SOLN: 0.0000 [IU] | INTRAMUSCULAR | 11 refills | Status: DC

## 2022-07-16 MED ORDER — DOCUSATE SODIUM 100 MG PO CAPS: 200.0000 mg | ORAL_CAPSULE | Freq: Two times a day (BID) | ORAL | 0 refills | Status: DC

## 2022-07-16 NOTE — Progress Notes (Signed)
     Referral previously received for Oscar Rose for goals of care discussion. Noted most recent palliative in-person assessment dated 07/15/22 at which time it was recommended to follow from a distance/chart check.   Chart reviewed for Recent provider notes, nurse notes, TOC notes, vitals, labs, and imaging and updates received from RN.   At this time patient appears stable. He was transferred  out of ICU to the med-surg floor.  Patient continues to refuse care and medications, refuses to urinate.  Insists on being "sent home".  No new labs or imaging today.  Discharge summary has been entered, anticipate discharge today. No plan for in person follow-up at this time. Please contact the palliative medicine provider on service for any new/urgent needs that require our assistance with this patient.  Thank you for your referral and allowing PMT to assist in [place] care.   Walden Field, NP Palliative Medicine Team Phone: [phone]  NO CHARGE

## 2022-07-16 NOTE — Progress Notes (Signed)
Patient has not voided this shift. I asked patient if we could get him up out of the bed to try and void and he said no. I asked him if I could at least get a bladder scan on him to which he responded, "Send me home." Patient has been very noncompliant all shift. He has refused everything. MD Mansy made aware. Will continue to monitor.

## 2022-07-16 NOTE — Discharge Summary (Signed)
Physician Discharge Summary   [name] MRN: [id number] DOB: [birth year]  Admit date:     06/29/2022  Discharge date: 07/16/22  Discharge Physician: Deatra James   PCP: Dione Housekeeper, MD   Recommendations at discharge:  Follow-up with the palliative care within 1 week follow-up with a PCP in 1-2 weeks Continue currently recommended medication, may be modified for better BP control, better blood sugar control  Discharge Diagnoses: Principal Problem:   Acute respiratory failure with hypoxia ([place]) Active Problems:   HCAP (healthcare-associated pneumonia)   CKD (chronic kidney disease) stage 3B   Hypertension   Type 2 diabetes mellitus (HCC)   Acute on chronic systolic CHF (congestive heart failure) (HCC)   Influenza A with pneumonia   Elevated troponin   ARDS (adult respiratory distress syndrome) ([place])   Influenza and pneumonia   Brief hospital course: Oscar Rose is a 61 y.o. male with medical history significant of type 2 diabetes with nephropathy, gastroesophageal reflux disease, hypertension, hyperlipidemia, chronic kidney disease stage IIIb, chronic respiratory failure, sleep apnea, class II obesity and recent hospitalization with multifocal pneumonia; presented from his extended care facility secondary to worsening shortness of breath and hypoxia.  On arrival to the emergency department patient condition further deteriorates and he was found to be obtunded with oxygen saturation in the mid 60s.  EMS personnel transported him providing bagging with bag-valve-mask.  Patient was intubated, mechanically ventilated and sedated.  At time of my evaluation unable to answer any questions or provide any history. Per EDP notes, EMS reports and information from his facility no chest pain, no nausea, no vomiting, no dysuria, no melena, no hematochezia, no focal deficits or any other complaints.   Workup in the ED demonstrating vascular congestion, pulmonary edema with  concern for ARDS and multifocal pneumonia.  Patient COVID PCR was negative but he was found to be positive for influenza A.  TRH has been consulted to place patient in the hospital for further evaluation and management.   07/03/22  -Dr. Halford Chessman recommends transfer to [facility] ICU with pulmonary critical care service managing patient/primary team -Patient with significant increased oxygen requirement, more difficult to ventilate over the past couple of days and demonstrating worsening renal function with marginal output while receiving diuresis.   -Patient is currently awaiting ICU bed at [facility] for further critical care management. - 07/04/22 Failed sedation vacation,  failed weaning trial   07/05/22  -Patient did poorly with sedation vacation and weaning trial-- Patient weaned on 10/5 with 40% from 1322 to 1455. At 1455 desaturation to 86%,1500 BP 221/82 patient was returned to rest mode of PRVC 26/ 600/ 80%/ PEEP 5. He was unable to maintain an adequate SpO2 on 40% at that time.,  FiO2 was increased to 80% temporarily   07/06/22 -Switched from IV propofol to Precedex--did better with weaning trial (almost 3 hrs) on Precedex   07/07/22 -Did not do well with pressure support/weaning trial--- lasted less than 2 hours -Back on vent -ET tube advanced   -07/09/22 -Intubated 06/29/2022 -Extubated 07/09/2022... Currently on nasal cannula at 5 L -May use BiPAP as needed and nightly   07/10/2222 -The patient was seen and examined, remains extubated, on 3 L of oxygen satting 99% -Still lethargic, but follows commands   07/11/2022 -Hemodynamically stable, mentation improving -Foley catheter was placed for urinary retention   07/12/2022 -Late yesterday evening and overnight patient had urinary retention,  -Hypertensive - Foley catheter had to be reinserted -Much  more awake alert, following commands-speech slow muffled -Tolerating p.o.   07/13/2022 -Lethargic today, with following  command, only drinks would not have solid food -Unable to adequately use the flutter valve and spirometer -Foley catheter still in place, clear urine noted  -Blood pressures improved with adjusted meds      07/14/2022 -Patient was seen and examined, hemodynamically stable, Agitated requesting Foley catheter be discontinued, refusing medication breathing treatments Requesting to be discharged  07/16/2022 -Stable hemodynamically, satting 97% on room air Requesting to be discharged, refusing any further care from medical staff. ---------------------------------------------------------------------------------------------------------------------------------------------   Assessment and Plan:   1)Acute respiratory failure with hypoxia/influenza A and HCAP -Much improved remained stable on room air satting 97%   -Acute respiratory failure with hypoxia secondary to pulmonary edema/acute on chronic systolic heart failure, influenza  A and multifocal pneumonia. - completed Tamiflu as ordered (5 day course started on 06/29/2022) Completed vanco,cefepime/Zosyn and azithro---last dose 07/06/2022 for HCAP protocol -Treated with Solu-Medrol, transitioned to prednisone--continue prednisone taper Off steroids now -IV Lasix changed to p.o. Lasix -Continue bronchodilators -Pulmonary critical care consult from Dr. Halford Chessman and Dr Melvyn Novas  -Intubated 06/29/2022 -Extubated 07/09/2022...  -As needed BiPAP -   2)HCAP (healthcare-associated pneumonia) --Management as above #1 Blood cultures from 06/29/2022 with PROPIONIBACTERIUM ACNES  Suspect this is a contaminant-- -completed antibiotics as above #1   3)AKI----acute kidney injury on CKD stage -IV -Creatinine was 2.2 on 11/08/2021, creatinine was 2.8 on 05/15/2022 Lab Results  Component Value Date   CREATININE 2.44 (H) 07/14/2022   CREATININE 2.44 (H) 07/13/2022   CREATININE 2.42 (H) 07/12/2022    -Creatinine continues to improve after peaking at  3.66 -renally adjust medications, avoid nephrotoxic agents / dehydration  / hypotension   4)Social/Ethics---no living Blood  relatives here [place] (his blood relatives live [place]),  Ms April Brame-- [phone]---is his Primary contact (she is the sister of Mr. Jutte's ex Fiancee who died in 11/09/2015) 08-04-22 I Called and updated Ms April Brame---she is currently having some car/transport Problems and unable to come to the hospital to complete POA paperwork with chaplain/social worker at this time   5)Acute on chronic anemia--Hgb is down to 8.6 from 10.2 on admission -Baseline hemoglobin usually around 9 -No obvious bleeding at this time continue to monitor   6)Behavioral concerns-patient with disorientation , refusing medications, refusing Rx, refusing labs and refusing food and refusing care,  -Get psychiatric/TTS consult - 7)Elevated Troponin -Appears to be demand ischemia in the setting of hypoxic respiratory failure -No ischemic changes appreciated on EKG and telementry -Echo from 05/13/2022 with EF of 55 to 60%, still showed wall motion abnormalities, with hypokinesis of the left ventricular, apical anterior inferior wall and lateral as well as inferior lateral and apical segments -No mitral stenosis or aortic stenosis -Continue treatment with Lipitor, aspirin and Plavix   8)Influenza A with pneumonia -Symptomatology resolved   9)Acute on chronic systolic CHF (congestive heart failure) (HCC) -Elevated BNP --Continue lasix -Echo as above #7   10)Type 2 diabetes mellitus ([place]) -With nephropathy; chronic kidney disease a stage IV -A1c 5.5 reflecting excellent diabetic control PTA -c/n --Lantus Use Novolog/Humalog Sliding scale insulin with Accu-Cheks/Fingersticks as ordered    11)Hypertension -Blood pressure elevated Continue current meds, Because he has been switched to a patch     12)FEN--tube feeding stopped after extubation on 07/09/2022 -Status post speech  evaluation, still recommending dysphagia 3 solids, nectar thick liquid diet  Liquid Administration via: Cup;Spoon    Medication Administration: Whole meds with  puree  Compensations: Slow rate;Small sips/bites  Postural Changes: Seated upright at 90 degrees  Oral Care Recommendations: Oral care BID  Other Recommendations: Remove water pitcher;Prohibited food (jello, ice cream, thin soups)       Disposition: Nursing home Diet recommendation:  Discharge Diet Orders (From admission, onward)     Start     Ordered   07/16/22 0000  Diet - low sodium heart healthy        07/16/22 1041           Cardiac and Carb modified diet DISCHARGE MEDICATION: Allergies as of 07/16/2022   No Known Allergies      Medication List     STOP taking these medications    amLODipine 10 MG tablet Commonly known as: NORVASC   ascorbic acid 500 MG tablet Commonly known as: VITAMIN C   Cholecalciferol 1.25 MG (50000 UT) Tabs   hydrALAZINE 100 MG tablet Commonly known as: APRESOLINE   icosapent Ethyl 1 g capsule Commonly known as: VASCEPA   losartan 25 MG tablet Commonly known as: COZAAR   Omega-3 1000 MG Caps   OYSTER SHELL CALCIUM PO   PRO-STAT PO       TAKE these medications    aspirin EC 81 MG tablet Take 81 mg by mouth daily.   atorvastatin 40 MG tablet Commonly known as: LIPITOR Take 40 mg by mouth daily.   busPIRone 7.5 MG tablet Commonly known as: BUSPAR Take 1 tablet (7.5 mg total) by mouth 2 (two) times daily.   cloNIDine 0.3 mg/24hr patch Commonly known as: CATAPRES - Dosed in mg/24 hr Place 1 patch (0.3 mg total) onto the skin once a week. Start taking on: July 21, 2022   clopidogrel 75 MG tablet Commonly known as: Plavix Take 1 tablet (75 mg total) by mouth daily.   docusate sodium 100 MG capsule Commonly known as: COLACE Take 2 capsules (200 mg total) by mouth 2 (two) times daily.   ferrous sulfate 325 (65 FE) MG tablet Take 325 mg by mouth  daily with breakfast.   furosemide 40 MG tablet Commonly known as: LASIX Take 1 tablet (40 mg total) by mouth daily. Start taking on: July 17, 2022   hydrOXYzine 25 MG tablet Commonly known as: ATARAX Take 1 tablet (25 mg total) by mouth 3 (three) times daily as needed for anxiety.   insulin aspart 100 UNIT/ML injection Commonly known as: novoLOG Inject 0-15 Units into the skin every 4 (four) hours.   insulin glargine 100 UNIT/ML injection Commonly known as: LANTUS Inject 0.2 mLs (20 Units total) into the skin at bedtime. What changed: how much to take   ipratropium-albuterol 0.5-2.5 (3) MG/3ML Soln Commonly known as: DUONEB Take 3 mLs by nebulization every 4 (four) hours as needed.   labetalol 200 MG tablet Commonly known as: NORMODYNE Take 1 tablet (200 mg total) by mouth 2 (two) times daily.   mouth rinse Liqd solution 15 mLs by Mouth Rinse route as needed (oral care).   multivitamin tablet Take 1 tablet by mouth daily.   OXYGEN Inhale 4 L into the lungs as needed (hypoxia).   sertraline 25 MG tablet Commonly known as: ZOLOFT Take 1 tablet (25 mg total) by mouth daily. Start taking on: July 17, 2022   tamsulosin 0.4 MG Caps capsule Commonly known as: FLOMAX Take 1 capsule (0.4 mg total) by mouth daily after supper.               Discharge Care Instructions  (  From admission, onward)           Start     Ordered   07/16/22 0000  Discharge wound care:       Comments: Per wound care RN instruction Consult wound care team at the facility   07/16/22 1041            Discharge Exam: Filed Weights   07/14/22 0422 07/15/22 2146 07/16/22 0544  Weight: 107.7 kg 103.8 kg 103.9 kg     Physical Exam:   General:  AAO x 3,  cooperative, no distress;   HEENT:  Normocephalic, PERRL, otherwise with in Normal limits   Neuro:  CNII-XII intact. , normal motor and sensation, reflexes intact   Lungs:   Clear to auscultation BL, Respirations  unlabored,  No wheezes / crackles  Cardio:    S1/S2, RRR, No murmure, No Rubs or Gallops   Abdomen:  Soft, non-tender, bowel sounds active all four quadrants, no guarding or peritoneal signs.  Muscular  skeletal:  Limited exam -global generalized weaknesses - in bed, able to move all 4 extremities,   2+ pulses,  symmetric, No pitting edema  Skin:  Dry, warm to touch, negative for any Rashes,  Wounds: Please see nursing documentation  Pressure Injury 07/05/22 Coccyx Medial Stage 2 -  Partial thickness loss of dermis presenting as a shallow open injury with a red, pink wound bed without slough. (Active)  07/05/22 1130  Location: Coccyx  Location Orientation: Medial  Staging: Stage 2 -  Partial thickness loss of dermis presenting as a shallow open injury with a red, pink wound bed without slough.  Wound Description (Comments):   Present on Admission: No  Dressing Type Foam - Lift dressing to assess site every shift 07/15/22 0800          Condition at discharge: fair  The results of significant diagnostics from this hospitalization (including imaging, microbiology, ancillary and laboratory) are listed below for reference.   Imaging Studies: DG Swallowing Func-Speech Pathology  Result Date: 07/12/2022 Table formatting from the original result was not included. Images from the original result were not included. Objective Swallowing Evaluation: Type of Study: MBS-Modified Barium Swallow Study  Patient Details Name: EZEKIAL ARNS MRN: [id number] Date of Birth: February 20, 1961 Today's Date: 07/12/2022 Time: SLP Start Time (ACUTE ONLY): 0930 -SLP Stop Time (ACUTE ONLY): 1000 SLP Time Calculation (min) (ACUTE ONLY): 30 min Past Medical History: Past Medical History: Diagnosis Date  Anemia   Arthritis   At risk for sleep apnea   STOP-BANG= 7    SENT TO PCP 06-29-2014  CKD (chronic kidney disease), stage II   montitored by nephrologist  Depression      Diabetic neuropathy ([place])   GERD (gastroesophageal  reflux disease)   Headache   SINUS  History of kidney stones   History of retinal detachment   Hyperlipidemia   Hypertension   Legal blindness of left eye, 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  STROKE  JULY  2017  Type 2 diabetes mellitus ([place])   Vitamin D deficiency  Past Surgical History: Past Surgical History: Procedure Laterality Date  ABDOMINAL AORTOGRAM W/LOWER EXTREMITY N/A 11/08/2021  Procedure: ABDOMINAL AORTOGRAM W/LOWER EXTREMITY;  Surgeon: Carlis Abbott,  Gwenyth Allegra, MD;  Location: [place] [facility];  Service: Cardiovascular;  Laterality: N/A;  AMPUTATION Bilateral 2012  Left big toe partial and right big toe complete  CARDIOVASCULAR STRESS TEST  04-05-2014  dr croitoru  low risk lexiscan nuclear study with mild diaphragmatic attenuation artifact/  no ischemia/  ef 58%  CATARACT EXTRACTION W/ INTRAOCULAR LENS  IMPLANT, BILATERAL  2013  CYSTO /  LEFT URETERAL STENT PLACEMENT  2012  CYSTOSCOPY W/ URETERAL STENT REMOVAL Left 07/07/2014  Procedure: CYSTO WITH LEFT PORTION STENT REMOVAL;  Surgeon: Malka So, MD;  Location: [facility];  Service: Urology;  Laterality: Left;  CYSTOSCOPY WITH URETEROSCOPY AND STENT PLACEMENT Left 07/07/2014  Procedure: CYSTOLITHALOPAXY URETEROSCOPY WITH STENT;  Surgeon: Malka So, MD;  [facility];  Service: Urology;  Laterality: Left;  ENDARTERECTOMY Right 04/04/2016  Procedure: ENDARTERECTOMY CAROTID ARTERY RIGHT;  Surgeon: Angelia Mould, MD;  Location: [place];  Service: Vascular;  Laterality: Right;  HOLMIUM LASER APPLICATION N/A 59/12/6385  Procedure: HOLMIUM LASER APPLICATION;  Surgeon: Malka So, MD;  Location: [facility];  Service: Urology;  Laterality: N/A;  I & D  INFECTED SPIDER  BITE UPPER BACK  06/ 2012  NEPHROLITHOTOMY Left 08/16/2014  Procedure: LEFT NEPHROLITHOTOMY PERCUTANEOUS;  Surgeon: Malka So, MD;  Location: [facility];  Service: Urology;  Laterality: Left;  NEPHROLITHOTOMY Left 08/18/2014  Procedure: LEFT NEPHROLITHOTOMY PERCUTANEOUS SECOND LOOK;  Surgeon: Malka So, MD;  Location: [facility];  Service: Urology;  Laterality: Left;  PATCH ANGIOPLASTY Right 04/04/2016  Procedure: PATCH ANGIOPLASTY RIGHT CAROTID ARTERY;  Surgeon: Angelia Mould, MD;  Location: [place];  Service: Vascular;  Laterality: Right;  PERIPHERAL VASCULAR BALLOON ANGIOPLASTY  11/08/2021  Procedure: PERIPHERAL VASCULAR BALLOON ANGIOPLASTY;  Surgeon: Marty Heck, MD;  Location: [place] [facility];  Service: Cardiovascular;;  Left AT  RETINAL DETACHMENT SURGERY Bilateral 2013  TONSILLECTOMY AND ADENOIDECTOMY  as child HPI: Oscar Rose is a 61 y.o. male with medical history significant of type 2 diabetes with nephropathy, gastroesophageal reflux disease, hypertension, hyperlipidemia, chronic kidney disease stage IIIb, chronic respiratory failure, sleep apnea, class II obesity and recent hospitalization with multifocal pneumonia; presented from his extended care facility secondary to worsening shortness of breath and hypoxia.  On arrival to the emergency department patient condition further deteriorates and he was found to be obtunded with oxygen saturation in the mid 60s.  EMS personnel transported him providing bagging with bag-valve-mask.  Patient was intubated, mechanically ventilated and sedated. Workup in the ED demonstrating vascular congestion, pulmonary edema with concern for ARDS and multifocal pneumonia.  Patient COVID PCR was negative but he was found to be positive for influenza A. Pt extubated today. BSE ordered.  Subjective: Whispers "I [facility] to [facility]"  Recommendations for follow up therapy are one component of a multi-disciplinary discharge planning process, led by the  attending physician.  Recommendations may be updated based on patient status, additional functional criteria and insurance authorization. Assessment / Plan / Recommendation   07/12/2022  11:00 AM Clinical Impressions Clinical Impression Pt presents with mild/moderate oropharyngeal post intubation dysphagia characterized by decreased epiglottic deflection, decreased BOT retraction, decreased pharyngeal paristalsis resulting in trace to mod amounts of silent aspiration of thin liquids with larger sips and when using the straw. NO ASPIRATION was visualized with small single cup sips of thin liquids in isolation from food, however, with cup sips after bites of puree or regular resulted  in trace silent aspiraiton of thin liquid. No aspiration of NTL was visualized, note increased slightly increased pharyngeal residue with NTL. Pt tolerated puree and regular textures with swallow triggered at the valleculae, and only min/trace residue in the valleculae after the swallow. Pt continues to be predominantly aphonic and question laryngeal edema? Recommend upgrade Pt's diet to D3/mech soft and NECTAR thick liquids; suspect subjective upgrade to thin liquids as Pt continues to improve. After oral care and in between meals, thin water and ice chips are ok per free water protocol. ST will continue to treat acutely. SLP Visit Diagnosis Dysphagia, unspecified (R13.10) Impact on safety and function Moderate aspiration risk;Risk for inadequate nutrition/hydration     07/12/2022  11:00 AM Treatment Recommendations Treatment Recommendations Therapy as outlined in treatment plan below     07/12/2022  11:00 AM Prognosis Prognosis for Safe Diet Advancement Fair Barriers to Reach Goals Severity of deficits   07/12/2022  11:00 AM Diet Recommendations SLP Diet Recommendations Dysphagia 3 (Mech soft) solids;Nectar thick liquid Liquid Administration via Cup;Spoon Medication Administration Whole meds with puree Compensations Slow rate;Small  sips/bites Postural Changes Seated upright at 90 degrees     07/12/2022  11:00 AM Other Recommendations Oral Care Recommendations Oral care BID Other Recommendations Remove water pitcher;Prohibited food (jello, ice cream, thin soups) Follow Up Recommendations Skilled nursing-short term rehab (<3 hours/day)   07/12/2022  11:00 AM Frequency and Duration  Speech Therapy Frequency (ACUTE ONLY) min 2x/week Treatment Duration 1 week     07/12/2022  11:00 AM Oral Phase Oral Phase [facility]    07/12/2022  11:00 AM Pharyngeal Phase Pharyngeal Phase Impaired Pharyngeal- Honey Teaspoon NT Pharyngeal Material does not enter airway Pharyngeal- Honey Cup Pharyngeal residue - valleculae;Pharyngeal residue - pyriform;Delayed swallow initiation-pyriform sinuses;Reduced pharyngeal peristalsis Pharyngeal Material does not enter airway Pharyngeal- Nectar Teaspoon NT Pharyngeal- Nectar Cup Reduced pharyngeal peristalsis;Pharyngeal residue - valleculae;Pharyngeal residue - pyriform;Penetration/Apiration after swallow;Trace aspiration;Delayed swallow initiation-pyriform sinuses Pharyngeal Material enters airway, passes BELOW cords without attempt by patient to eject out (silent aspiration);Material enters airway, passes BELOW cords and not ejected out despite cough attempt by patient Pharyngeal- Nectar Straw NT Pharyngeal- Thin Teaspoon WFL Pharyngeal Material does not enter airway Pharyngeal- Thin Cup Reduced pharyngeal peristalsis;Reduced airway/laryngeal closure;Reduced tongue base retraction;Penetration/Aspiration during swallow;Penetration/Apiration after swallow;Trace aspiration;Moderate aspiration;Pharyngeal residue - valleculae;Pharyngeal residue - pyriform;Delayed swallow initiation-vallecula;Reduced epiglottic inversion Pharyngeal Material enters airway, passes BELOW cords without attempt by patient to eject out (silent aspiration);Material enters airway, passes BELOW cords and not ejected out despite cough attempt by patient Pharyngeal-  Thin Straw Reduced pharyngeal peristalsis;Reduced airway/laryngeal closure;Reduced tongue base retraction;Penetration/Aspiration during swallow;Penetration/Apiration after swallow;Trace aspiration;Moderate aspiration;Pharyngeal residue - valleculae;Pharyngeal residue - pyriform;Delayed swallow initiation-vallecula;Reduced epiglottic inversion Pharyngeal Material enters airway, passes BELOW cords without attempt by patient to eject out (silent aspiration);Material enters airway, passes BELOW cords and not ejected out despite cough attempt by patient Pharyngeal- Puree Reduced epiglottic inversion;Pharyngeal residue - valleculae Pharyngeal Material does not enter airway Pharyngeal- Mechanical Soft NT Pharyngeal- Regular Pharyngeal residue - valleculae;Delayed swallow initiation-vallecula;Reduced epiglottic inversion Pharyngeal Material does not enter airway Pharyngeal- Multi-consistency NT Pharyngeal- Pill Delayed swallow initiation-vallecula Pharyngeal Material does not enter airway     No data to display    Amelia H. Roddie Mc, CCC-SLP Speech Language Pathologist Wende Bushy 07/12/2022, 11:39 AM                     DG CHEST PORT 1 VIEW  Result Date: 07/10/2022 CLINICAL DATA:  Dyspnea EXAM: PORTABLE CHEST  1 VIEW COMPARISON:  07/08/2022 FINDINGS: Normal cardiac silhouette. Interval removal of NG tube. Bilateral pleural effusions. Central venous congestion. No focal consolidation. Pneumothorax. IMPRESSION: Central venous congestion and bilateral pleural effusions. No interval change. Electronically Signed   By: Suzy Bouchard M.D.   On: 07/10/2022 07:55   DG CHEST PORT 1 VIEW  Result Date: 07/08/2022 CLINICAL DATA:  Endotracheal tube. EXAM: PORTABLE CHEST 1 VIEW COMPARISON:  07/07/2022 and CT chest 05/14/2022. FINDINGS: Endotracheal tube terminates 5.4 cm above the carina. Nasogastric tube is followed into the stomach with the tip beyond the inferior margin of the image. Heart size stable. Mixed  interstitial and airspace disease persists. Bilateral pleural effusions, right greater than left. IMPRESSION: Probable congestive heart failure.  Difficult to exclude pneumonia. Electronically Signed   By: Lorin Picket M.D.   On: 07/08/2022 08:27   DG Chest 1 View  Result Date: 07/07/2022 CLINICAL DATA:  10026 for shortness of breath, ventilator dependent respiratory failure. EXAM: CHEST  1 VIEW COMPARISON:  Portable chest 07/04/2022 FINDINGS: 5:09 a.m. NGT is well inside the stomach, headed towards the gastric antrum but neither the side-hole or tip are filmed. ETT interval pullback to 6.1 cm from the carina, in between the heads of the clavicles. Mild cardiomegaly. There is perihilar vascular congestion and mild-to-moderate perihilar edema, mildly improved. There are still bilateral perihilar opacities extending into the lower lung fields consistent with alveolar edema, but they are slightly less dense. Also, the interstitial edema has receded from the upper 1/3 of the lungs and is less prominent in the mid and lower zones than previously. Small layering pleural effusions also appear smaller than previously. There is no pneumothorax. Underlying pneumonia component is difficult to exclude given the density of opacities. The mediastinum is stable.  Advanced thoracic spondylosis. IMPRESSION: 1. ETT interval pullback to 6.1 cm from the carina. 2. NGT well inside the stomach but not fully seen. 3. Mild improvement in vascular congestion and edema but still mild-to-moderate. 4. Persistent bilateral perihilar and lower lung zone opacities consistent with alveolar edema, but they are slightly less dense. 5. Small layering pleural effusions, smaller than previously. Electronically Signed   By: Telford Nab M.D.   On: 07/07/2022 06:04   DG CHEST PORT 1 VIEW  Result Date: 07/04/2022 CLINICAL DATA:  [id number], with ventilator dependent respiratory failure. EXAM: PORTABLE CHEST 1 VIEW COMPARISON:  Portable chest  07/02/2022 FINDINGS: 5:02 a.m. ETT tip remains 3.9 cm from the carina. NGT enters the stomach with the full intragastric course not filmed. Cardiomegaly is unchanged as well as central vascular prominence and mild to moderate perihilar edema. Moderate layering pleural effusions continue to be noted as well as patchy dense opacities of the right-greater-than-left lung fields, which could be due to airspace edema, pneumonia or combination. Likely some of this is atelectasis. The mediastinum is unchanged. Mild aortic atherosclerosis. No acute osseous findings. Today there is increased interstitial consolidation in the upper 1/3 of the right lung but no interval change elsewhere. IMPRESSION: 1. Increased interstitial consolidation in the upper 1/3 of the right lung but no interval change elsewhere. 2. Stable cardiomegaly, central vascular prominence and mild/moderate perihilar edema. 3. Moderate layering pleural effusions. 4. Otherwise stable dense opacities in the right-greater-than-left lungs, which could be due to airspace edema, pneumonia or combination. Electronically Signed   By: Telford Nab M.D.   On: 07/04/2022 06:01   ECHOCARDIOGRAM LIMITED  Result Date: 07/03/2022    ECHOCARDIOGRAM LIMITED REPORT   Patient Name:   Maxine  Lenore Cordia Date of Exam: 07/03/2022 Medical Rec #:  [id number]      Height:       71.0 in Accession #:    [id number]     Weight:       274.0 lb Date of Birth:  07-Dec-1960       BSA:          2.411 m Patient Age:    39 years       BP:           160/56 mmHg Patient Gender: M              HR:           67 bpm. Exam Location:  [facility] Procedure: Limited Echo, 2D Echo, Cardiac Doppler, Limited Color Doppler and            Intracardiac Opacification Agent Indications:     Elevated Troponin  History:         Patient has prior history of Echocardiogram examinations, most                  recent 05/13/2022. CHF, Stroke; Risk Factors:Hypertension,                  Diabetes, Non-Smoker, Dyslipidemia  and Sleep Apnea.  Sonographer:     Greer Pickerel Referring Phys:  [id number] COURAGE EMOKPAE Diagnosing Phys: Lorelee Cover Mallipeddi  Sonographer Comments: Echo performed with patient supine and on artificial respirator. Image acquisition challenging due to respiratory motion. IMPRESSIONS  1. Limited Echo to evaluate LVEF. Poor acoustic windows. Definity contrast administered.  2. Left ventricular ejection fraction, by estimation, is 55 to 60%. The left ventricle has normal function. The left ventricle demonstrates regional wall motion abnormalities (see scoring diagram/findings for description).  3. Right ventricular systolic function was not well visualized. The right ventricular size is not well visualized.  4. The mitral valve is grossly normal. No evidence of mitral valve regurgitation. No evidence of mitral stenosis.  5. The aortic valve is tricuspid. Aortic valve regurgitation is not visualized. No aortic stenosis is present. Comparison(s): Changes from prior study are noted. LVEF grossly normal. FINDINGS  Left Ventricle: Left ventricular ejection fraction, by estimation, is 55 to 60%. The left ventricle has normal function. The left ventricle demonstrates regional wall motion abnormalities. Definity contrast agent was given IV to delineate the left ventricular endocardial borders. The left ventricular internal cavity size was normal in size. There is no left ventricular hypertrophy. Left ventricular diastolic function could not be evaluated due to nondiagnostic images.  LV Wall Scoring: The apex is hypokinetic. Right Ventricle: The right ventricular size is not well visualized. Right vetricular wall thickness was not well visualized. Right ventricular systolic function was not well visualized. Left Atrium: Left atrial size was not assessed. Right Atrium: Right atrial size was not assessed. Pericardium: There is no evidence of pericardial effusion. Mitral Valve: The mitral valve is grossly normal. No evidence of  mitral valve stenosis. Tricuspid Valve: Tricuspid valve regurgitation is not demonstrated. No evidence of tricuspid stenosis. Aortic Valve: The aortic valve is tricuspid. Aortic valve regurgitation is not visualized. No aortic stenosis is present. Pulmonic Valve: The pulmonic valve was not assessed. Aorta: The aortic root is normal in size and structure. Venous: IVC assessment for right atrial pressure unable to be performed due to mechanical ventilation. Additional Comments: Spectral Doppler performed. Color Doppler performed.  LEFT VENTRICLE PLAX 2D LVIDd:  4.30 cm LVIDs:         3.70 cm LV PW:         1.80 cm LV IVS:        1.40 cm  LEFT ATRIUM         Index LA diam:    5.00 cm 2.07 cm/m  AORTIC VALVE LVOT Vmax:   99.80 cm/s LVOT Vmean:  67.700 cm/s LVOT VTI:    0.242 m MITRAL VALVE MV Area (PHT): 4.68 cm     SHUNTS MV Decel Time: 162 msec     Systemic VTI: 0.24 m MV E velocity: 121.00 cm/s MV A velocity: 60.80 cm/s MV E/A ratio:  1.99 Vishnu Priya Mallipeddi Electronically signed by Lorelee Cover Mallipeddi Signature Date/Time: 07/03/2022/2:24:44 PM    Final (Updated)    DG Chest Port 1 View  Result Date: 07/02/2022 CLINICAL DATA:  [id number] with pneumonia, follow-up. Ventilator dependent respiratory failure. EXAM: PORTABLE CHEST 1 VIEW COMPARISON:  Portable chest 07/01/2022, 06/29/2022. Most recent yesterday at 3 p.m. FINDINGS: 4:54 a.m. ETT tip is 4 cm from the carina. NGT is well inside the stomach but neither the chip or side-hole are filmed. The heart is enlarged. There is perihilar vascular congestion and mild central edema. The edema component has improved since yesterday's most recent film, was previously moderate. Moderate layering pleural effusions continue to be noted with overlying atelectasis or consolidation to the mid hilar levels. The upper 1/3 of the lung fields are generally clear. The mediastinum is stable. There is mild aortic atherosclerosis. Osteopenia and thoracic spondylosis.  IMPRESSION: 1. Cardiomegaly with perihilar vascular congestion and mild central edema. The edema component has improved since yesterday's most recent film. 2. Moderate layering pleural effusions with overlying atelectasis or consolidation to the mid hilar levels. This is not significantly changed. Electronically Signed   By: Telford Nab M.D.   On: 07/02/2022 07:16   DG CHEST PORT 1 VIEW  Result Date: 07/01/2022 CLINICAL DATA:  Respiratory abnormality EXAM: PORTABLE CHEST 1 VIEW COMPARISON:  07/01/2022, 06/29/2022, CT 05/14/2022 FINDINGS: Endotracheal tube tip is about 4.7 cm superior to carina. Esophageal tube tip below the diaphragm but incompletely visualized. Bilateral pleural effusions. Cardiomegaly with vascular congestion and edema, overall worsened aeration compared with radiograph earlier today. Persistent basilar consolidations. IMPRESSION: 1. Endotracheal tube tip about 4.7 cm superior to carina. Esophageal tube tip below the diaphragm but incompletely visualized. 2. Cardiomegaly with vascular congestion and edema or diffuse pneumonia. Bilateral pleural effusions and basilar consolidations. Findings appear slightly worse compared to radiograph performed earlier today. Electronically Signed   By: Donavan Foil M.D.   On: 07/01/2022 15:12   DG CHEST PORT 1 VIEW  Result Date: 07/01/2022 CLINICAL DATA:  Shortness of breath.  Ventilator support. EXAM: PORTABLE CHEST 1 VIEW COMPARISON:  06/29/2022 FINDINGS: Endotracheal tube tip 5 cm above the carina. Orogastric or nasogastric tube enters the abdomen. Bilateral pleural effusions with pulmonary edema and lower lobe atelectasis persists, possibly mildly improved. No worsening or new finding. IMPRESSION: Possible mild improvement in aeration at the lung bases. Persistent effusions and edema/atelectasis. Electronically Signed   By: Nelson Chimes M.D.   On: 07/01/2022 07:59   DG Chest Port 1 View  Result Date: 06/29/2022 CLINICAL DATA:  Status post  intubation. EXAM: PORTABLE CHEST 1 VIEW COMPARISON:  05/16/2022 FINDINGS: ET tube tip is above the carina. There is an enteric tube with tip and side port below the level of the hemidiaphragms. Stable cardiac enlargement. Diffuse bilateral interstitial and airspace  opacities are identified, most severe within the right lower lung. Small right pleural effusion noted. IMPRESSION: 1. Satisfactory position of ET tube and enteric tube. 2. Cardiac enlargement, right pleural effusion and diffuse bilateral pulmonary opacities concerning for multifocal infection versus diffuse pulmonary edema versus ARDS. Electronically Signed   By: Kerby Moors M.D.   On: 06/29/2022 06:43   US RENAL  Result Date: 06/21/2022 CLINICAL DATA:  Acute renal failure EXAM: RENAL / URINARY TRACT ULTRASOUND COMPLETE COMPARISON:  None Available. FINDINGS: Right Kidney: Renal measurements: 12.4 x 7.1 x 7.5 cm = volume: 345 mL. Echogenicity within normal limits. No mass or hydronephrosis visualized. Left Kidney: Renal measurements: 11.6 x 5.8 x 6.0 cm = volume: 212 mL. There is subjective cortical thinning on the left, asymmetric to the right. Bladder: Only postvoid imaging was obtained of the bladder with an 88.5% postvoid residual. The bladder is otherwise unremarkable but evaluation is limited due to lack of distention. Other: None. IMPRESSION: 1. Only postvoid imaging was obtained of the bladder with an 88.5% postvoid residual. The bladder is otherwise unremarkable but evaluation is limited due to lack of distention. 2. There is subjective cortical thinning on the left, asymmetric to the right. The kidneys are otherwise unremarkable. Electronically Signed   By: Dorise Bullion III M.D.   On: 06/21/2022 12:41    Microbiology: Results for orders placed or performed during the hospital encounter of 06/29/22  Resp Panel by RT-PCR (Flu A&B, Covid) Anterior Nasal Swab     Status: Abnormal   Collection Time: 06/29/22  5:25 AM   Specimen:  Anterior Nasal Swab  Result Value Ref Range Status   SARS Coronavirus 2 by RT PCR NEGATIVE NEGATIVE Final    Comment: (NOTE) SARS-CoV-2 target nucleic acids are NOT DETECTED.  The SARS-CoV-2 RNA is generally detectable in upper respiratory specimens during the acute phase of infection. The lowest concentration of SARS-CoV-2 viral copies this assay can detect is 138 copies/mL. A negative result does not preclude SARS-Cov-2 infection and should not be used as the sole basis for treatment or other patient management decisions. A negative result may occur with  improper specimen collection/handling, submission of specimen other than nasopharyngeal swab, presence of viral mutation(s) within the areas targeted by this assay, and inadequate number of viral copies(<138 copies/mL). A negative result must be combined with clinical observations, patient history, and epidemiological information. The expected result is Negative.  Fact Sheet for Patients:  EntrepreneurPulse.com.au  Fact Sheet for Healthcare Providers:  IncredibleEmployment.be  This test is no t yet approved or cleared by the [place] FDA and  has been authorized for detection and/or diagnosis of SARS-CoV-2 by FDA under an Emergency Use Authorization (EUA). This EUA will remain  in effect (meaning this test can be used) for the duration of the COVID-19 declaration under Section 564(b)(1) of the Act, 21 U.S.C.section 360bbb-3(b)(1), unless the authorization is terminated  or revoked sooner.       Influenza A by PCR POSITIVE (A) NEGATIVE Final   Influenza B by PCR NEGATIVE NEGATIVE Final    Comment: (NOTE) The Xpert Xpress SARS-CoV-2/FLU/RSV plus assay is intended as an aid in the diagnosis of influenza from Nasopharyngeal swab specimens and should not be used as a sole basis for treatment. Nasal washings and aspirates are unacceptable for Xpert Xpress  SARS-CoV-2/FLU/RSV testing.  Fact Sheet for Patients: EntrepreneurPulse.com.au  Fact Sheet for Healthcare Providers: IncredibleEmployment.be  This test is not yet approved or cleared by the [place] FDA and has  been authorized for detection and/or diagnosis of SARS-CoV-2 by FDA under an Emergency Use Authorization (EUA). This EUA will remain in effect (meaning this test can be used) for the duration of the COVID-19 declaration under Section 564(b)(1) of the Act, 21 U.S.C. section 360bbb-3(b)(1), unless the authorization is terminated or revoked.  Performed at [facility], [address] [place], [place] [address]   Culture, blood (routine x 2)     Status: Abnormal   Collection Time: 06/29/22  5:37 AM   Specimen: BLOOD RIGHT HAND  Result Value Ref Range Status   Specimen Description   Final    BLOOD RIGHT HAND Performed at [facility], [place] [address], [place], [place] [address]    Special Requests   Final    BOTTLES DRAWN AEROBIC AND ANAEROBIC Blood Culture adequate volume Performed at [facility], [place] [address], [place], [place] [address]    Culture  Setup Time   Final    ANAEROBIC BOTTLE ONLY GRAM POSITIVE RODS Gram Stain Report Called to,Read Back By and Verified With: ANGIE COE @ 1700 ON 07/02/22 Sandy Salaam Performed at [facility], [address], [place], [place] [address]    Culture (A)  Final    PROPIONIBACTERIUM ACNES Standardized susceptibility testing for this organism is not available. Performed at [facility], [place] [address], [place], [place] [address]    Report Status 07/04/2022 FINAL  Final  Culture, blood (routine x 2)     Status: Abnormal   Collection Time: 06/29/22  5:40 AM   Specimen: BLOOD LEFT HAND  Result Value Ref Range Status   Specimen Description   Final    BLOOD LEFT HAND BOTTLES DRAWN AEROBIC AND ANAEROBIC Performed at [facility], [address] [place], [place] [address]     Special Requests   Final    Blood Culture adequate volume Performed at [facility], [address], [place], [place] [address]    Culture  Setup Time   Final    GRAM POSITIVE RODS ANAEROBIC BOTTLE ONLY CRITICAL VALUE NOTED.  VALUE IS CONSISTENT WITH PREVIOUSLY REPORTED AND CALLED VALUE.    Culture (A)  Final    PROPIONIBACTERIUM ACNES Standardized susceptibility testing for this organism is not available. Performed at [facility], [place] [address], [place], [place] [address]    Report Status 07/05/2022 FINAL  Final  MRSA Next Gen by PCR, Nasal     Status: None   Collection Time: 06/29/22  9:39 AM   Specimen: Nasal Mucosa; Nasal Swab  Result Value Ref Range Status   MRSA by PCR Next Gen NOT DETECTED NOT DETECTED Final    Comment: (NOTE) The GeneXpert MRSA Assay (FDA approved for NASAL specimens only), is one component of a comprehensive MRSA colonization surveillance program. It is not intended to diagnose MRSA infection nor to guide or monitor treatment for MRSA infections. Test performance is not FDA approved in patients less than 32 years old. Performed at [facility] [address] [place], [place] [address]     Labs: CBC: Recent Labs  Lab 07/10/22 0351 07/11/22 0359 07/12/22 0414 07/13/22 0553 07/14/22 0345  WBC 9.3 11.0* 6.5 10.6* 6.6  HGB 9.0* 9.2* 9.3* 9.2* 8.5*  HCT 29.2* 29.7* 30.4* 30.0* 26.7*  MCV 91.8 91.4 91.6 91.7 90.2  PLT 309 320 339 322 998   Basic Metabolic Panel: Recent Labs  Lab 07/10/22 0351 07/11/22 0359 07/12/22 0414 07/13/22 0553 07/14/22 0345  NA 147* 147*  149* 142 137  K 4.3 4.2 4.0 3.6 3.5  CL 111 111 113* 108 101  CO2 25 28 29 26 27   GLUCOSE 164* 177* 172* 205* 180*  BUN 97* 95* 92* 81* 75*  CREATININE 2.42* 2.42* 2.42* 2.44* 2.44*  CALCIUM 9.2 9.3 9.4 8.9 8.6*   Liver Function Tests: Recent Labs  Lab 07/10/22 0351  AST 17  ALT 14  ALKPHOS 64  BILITOT 0.4  PROT 7.0  ALBUMIN 2.9*   CBG: Recent Labs  Lab  07/15/22 0502 07/15/22 0750 07/15/22 1148 07/15/22 1559 07/15/22 2011  GLUCAP 124* 106* 102* 103* 164*    Discharge time spent: greater than 55 minutes.  Signed: Deatra James, MD [facility] 07/16/2022

## 2022-07-16 NOTE — Progress Notes (Signed)
Patient refused his morning labs. MD Mansy notified.

## 2022-07-16 NOTE — Care Management Important Message (Signed)
Important Message  Patient Details  Name: Oscar Rose MRN: [id number] Date of Birth: [birth year]   Medicare Important Message Given:  Yes     Tommy Medal 07/16/2022, 11:20 AM

## 2022-07-16 NOTE — Progress Notes (Signed)
Report from third shift is that patient had been refusing everything. Since this shift started patient has refused glucose checks, bladder scan as he has not urinated, refusing to allow nurse to assess and will not take medications. Patient states he is not going to let me do anything and we need to let him go home. MD Shahmehdi notified.

## 2022-07-16 NOTE — TOC Transition Note (Signed)
Transition of Care [facility]) - CM/SW Discharge Note   Patient Details  Name: Oscar Rose MRN: [id number] Date of Birth: 08/19/1960  Transition of Care [facility]) CM/SW Contact:  Boneta Lucks, RN Phone Number: 07/16/2022, 12:42 PM   Clinical Narrative:   Patient returning to [facility]. RN called report. TOC updated April, she is at the bedside. EMS called and medical necessity printed.    Final next level of care: Long Term Acute Care (LTAC) Barriers to Discharge: Barriers Resolved  Patient Goals and CMS Choice Patient states their goals for this hospitalization and ongoing recovery are:: return to LTC CMS Medicare.gov Compare Post Acute Care list provided to:: Patient Choice offered to / list presented to : Patient  Discharge Placement                  Name of family member notified: April Patient and family notified of of transfer: 07/16/22  Discharge Plan and Services In-house Referral: Clinical Social Work Discharge Planning Services: CM Consult Post Acute Care Choice: Nursing Home              Readmission Risk Interventions    07/16/2022   11:06 AM 06/30/2022   10:23 AM  Readmission Risk Prevention Plan  Transportation Screening Complete Complete  Medication Review Press photographer) Complete Complete  PCP or Specialist appointment within 3-5 days of discharge Complete   HRI or Home Care Consult Complete Complete  SW Recovery Care/Counseling Consult Complete Complete  Palliative Care Screening Complete Not Applicable  Skilled Nursing Facility Complete Complete

## 2022-08-13 ENCOUNTER — Other Ambulatory Visit (HOSPITAL_COMMUNITY): Payer: Self-pay | Admitting: Nephrology

## 2022-08-13 DIAGNOSIS — N170 Acute kidney failure with tubular necrosis: Secondary | ICD-10-CM

## 2022-08-13 DIAGNOSIS — I129 Hypertensive chronic kidney disease with stage 1 through stage 4 chronic kidney disease, or unspecified chronic kidney disease: Secondary | ICD-10-CM

## 2022-08-13 DIAGNOSIS — N189 Chronic kidney disease, unspecified: Secondary | ICD-10-CM

## 2022-09-02 ENCOUNTER — Other Ambulatory Visit: Payer: Self-pay

## 2022-09-02 DIAGNOSIS — I739 Peripheral vascular disease, unspecified: Secondary | ICD-10-CM

## 2022-09-02 DIAGNOSIS — I70222 Atherosclerosis of native arteries of extremities with rest pain, left leg: Secondary | ICD-10-CM

## 2022-09-04 ENCOUNTER — Encounter: Payer: Self-pay | Admitting: Vascular Surgery

## 2022-09-04 ENCOUNTER — Ambulatory Visit (INDEPENDENT_AMBULATORY_CARE_PROVIDER_SITE_OTHER): Payer: Medicare Other | Admitting: Vascular Surgery

## 2022-09-04 ENCOUNTER — Ambulatory Visit (INDEPENDENT_AMBULATORY_CARE_PROVIDER_SITE_OTHER): Payer: Medicare Other

## 2022-09-04 VITALS — BP 161/75 | HR 67 | Temp 97.9°F | Ht 71.0 in | Wt 214.0 lb

## 2022-09-04 DIAGNOSIS — I739 Peripheral vascular disease, unspecified: Secondary | ICD-10-CM

## 2022-09-04 DIAGNOSIS — I70222 Atherosclerosis of native arteries of extremities with rest pain, left leg: Secondary | ICD-10-CM | POA: Diagnosis not present

## 2022-09-04 LAB — VAS US ABI WITH/WO TBI
Left ABI: 1.01
Right ABI: 0.66

## 2022-09-04 NOTE — Progress Notes (Signed)
[facility] [place]  Patient name: Oscar Rose MRN: [id number] DOB: 07-07-1961 Sex: male  REASON FOR VISIT: Follow-up lower extremity arterial insufficiency  HPI: Oscar Rose is a 62 y.o. male here today for follow-up.  He continues to be a resident of [facility] facility.  He has a prior history of stroke.  He had a recent admission with respiratory failure requiring intubation.  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  He is essentially wheelchair-bound.  He does not have assist with transferring from bed to chair.  He had undergone anterior tibial angioplasty with Dr. Carlis Abbott on 11/08/2021 due to nonhealing left transmetatarsal amputation.  He went on to subsequent complete healing of this.  He had a remote history of right great toe amputation.  He has ulceration over the second toe currently.  He has hammertoe deformity secondary to the great toe metatarsal head resection.  There is a superficial ulceration over the top of his second toe knuckle.  Ports this been there for several months.  It is not causing any pain  Past Medical History:  Diagnosis Date   Anemia    Arthritis    At risk for sleep apnea    STOP-BANG= 7    SENT TO PCP 06-29-2014   CKD (chronic kidney disease), stage II    montitored by nephrologist   Depression        Diabetic neuropathy (HCC)    GERD (gastroesophageal reflux disease)    Headache    SINUS   History of kidney stones    History of retinal detachment    Hyperlipidemia    Hypertension    Legal blindness of left eye, as defined in [place]    SECONDARY TO RETAINAL DETACHMENT   Neuropathy    PVD (peripheral vascular disease) ([place])    Restless leg syndrome    Retained ureteral stent    w/ encrustation SINCE 2012   Rotator cuff syndrome of right shoulder    Sleep apnea in adult    Stroke [facility])    ?  STROKE  JULY  2017    Type 2 diabetes mellitus (HCC)    Vitamin D deficiency     Family History  Problem Relation Age of Onset   Cancer Mother        Throat   Stroke Neg Hx     SOCIAL HISTORY: Social History   Tobacco Use   Smoking status: Never   Smokeless tobacco: Never  Substance Use Topics   Alcohol use: Not Currently    Comment: Occassionally    No Known Allergies  Current Outpatient Medications  Medication Sig Dispense Refill   aspirin EC 81 MG tablet Take 81 mg by mouth daily.     atorvastatin (LIPITOR) 40 MG tablet Take 40 mg by mouth daily.     cloNIDine (CATAPRES - DOSED IN MG/24 HR) 0.3 mg/24hr patch Place 1 patch (0.3 mg total) onto the skin once a week. 4 patch 12   clopidogrel (PLAVIX) 75 MG tablet Take 1 tablet (75 mg total) by mouth daily. 30 tablet 11   ferrous sulfate 325 (65 FE) MG tablet Take 325 mg by mouth daily with breakfast.     furosemide (LASIX) 40 MG tablet Take 1 tablet (40 mg total) by mouth daily. 30 tablet 1   hydrOXYzine (ATARAX) 25 MG tablet Take 1 tablet (25 mg total) by mouth 3 (  three) times daily as needed for anxiety. 30 tablet 0   insulin aspart (NOVOLOG) 100 UNIT/ML injection Inject 0-15 Units into the skin every 4 (four) hours. 10 mL 11   insulin glargine (LANTUS) 100 UNIT/ML injection Inject 0.2 mLs (20 Units total) into the skin at bedtime. 10 mL 11   ipratropium-albuterol (DUONEB) 0.5-2.5 (3) MG/3ML SOLN Take 3 mLs by nebulization every 4 (four) hours as needed. 360 mL 0   Mouthwashes (MOUTH RINSE) LIQD solution 15 mLs by Mouth Rinse route as needed (oral care). 118 mL 0   Multiple Vitamin (MULTIVITAMIN) tablet Take 1 tablet by mouth daily.     OXYGEN Inhale 4 L into the lungs as needed (hypoxia).     docusate sodium (COLACE) 100 MG capsule Take 2 capsules (200 mg total) by mouth 2 (two) times daily. (Patient not taking: Reported on 09/04/2022) 10 capsule 0   labetalol (NORMODYNE) 200 MG tablet Take 1 tablet (200 mg total) by mouth 2 (two) times daily. 60  tablet 0   sertraline (ZOLOFT) 25 MG tablet Take 1 tablet (25 mg total) by mouth daily. 30 tablet 0   No current facility-administered medications for this visit.    REVIEW OF SYSTEMS:  [X]  denotes positive finding, [ ]  denotes negative finding Cardiac  Comments:  Chest pain or chest pressure:    Shortness of breath upon exertion:    Short of breath when lying flat:    Irregular heart rhythm:        Vascular    Pain in calf, thigh, or hip brought on by ambulation:    Pain in feet at night that wakes you up from your sleep:     Blood clot in your veins:    Leg swelling:           PHYSICAL EXAM: Vitals:   09/04/22 0907  BP: (!) 161/75  Pulse: 67  Temp: 97.9 F (36.6 C)  SpO2: 97%  Weight: 214 lb (97.1 kg)  Height: 5\' 11"  (1.803 m)    GENERAL: The patient is a well-nourished male, in no acute distress. The vital signs are documented above. CARDIOVASCULAR: 2+ dorsalis pedis pulse on the left.  No palpable pulses on the right PULMONARY: There is good air exchange  MUSCULOSKELETAL: There are no major deformities or cyanosis. NEUROLOGIC: No focal weakness or paresthesias are detected. SKIN: There are no ulcers or rashes noted. PSYCHIATRIC: The patient has a normal affect.  DATA: Ankle arm index on the left is normal with biphasic waveforms.  Ankle arm index on the right is 0.6  MEDICAL ISSUES: Excellent result from his left anterior tibial angioplasty with normal flow and complete healing of his transmetatarsal amputation.  I did discuss issues regarding his right foot.  He has marginal flow for healing of his right second toe ulceration.  This has been stable for months.  It is not causing him any pain.  He will continue to have this monitored at [facility] facility.  He also reports he has a large sacral decubitus which is having wound care there as well.  We will see him in 1 year.  If he has progression of the wound on his right second toe, will need arteriography  for further evaluation    Rosetta Posner, MD FACS [facility] [place] Office Tel [phone]  Note: Portions of this report may have been transcribed using voice recognition software.  Every effort has been made to ensure accuracy; however,  inadvertent computerized transcription errors may still be present.

## 2022-09-10 ENCOUNTER — Ambulatory Visit (HOSPITAL_COMMUNITY)
Admission: RE | Admit: 2022-09-10 | Discharge: 2022-09-10 | Disposition: A | Discharge status: Home / Self Care | Payer: Medicare Other | Source: Ambulatory Visit | Attending: Nephrology | Admitting: Nephrology

## 2022-09-10 DIAGNOSIS — N189 Chronic kidney disease, unspecified: Secondary | ICD-10-CM | POA: Insufficient documentation

## 2022-09-10 DIAGNOSIS — I129 Hypertensive chronic kidney disease with stage 1 through stage 4 chronic kidney disease, or unspecified chronic kidney disease: Secondary | ICD-10-CM | POA: Diagnosis present

## 2022-09-10 DIAGNOSIS — N170 Acute kidney failure with tubular necrosis: Secondary | ICD-10-CM | POA: Insufficient documentation

## 2022-10-22 ENCOUNTER — Institutional Professional Consult (permissible substitution): Payer: Medicare Other | Admitting: Neurology

## 2022-10-23 ENCOUNTER — Other Ambulatory Visit: Payer: Self-pay

## 2022-10-23 DIAGNOSIS — D509 Iron deficiency anemia, unspecified: Secondary | ICD-10-CM | POA: Insufficient documentation

## 2022-10-23 DIAGNOSIS — D631 Anemia in chronic kidney disease: Secondary | ICD-10-CM | POA: Insufficient documentation

## 2022-10-23 DIAGNOSIS — D500 Iron deficiency anemia secondary to blood loss (chronic): Secondary | ICD-10-CM

## 2022-10-24 ENCOUNTER — Encounter (HOSPITAL_COMMUNITY): Payer: Self-pay | Admitting: Nephrology

## 2022-10-28 ENCOUNTER — Other Ambulatory Visit: Payer: Self-pay

## 2022-10-28 ENCOUNTER — Telehealth: Payer: Self-pay | Admitting: Pharmacy Technician

## 2022-10-28 ENCOUNTER — Telehealth (HOSPITAL_COMMUNITY): Payer: Self-pay

## 2022-10-28 ENCOUNTER — Telehealth: Payer: Self-pay

## 2022-10-28 ENCOUNTER — Encounter (HOSPITAL_COMMUNITY)
Admission: RE | Admit: 2022-10-28 | Discharge: 2022-10-28 | Disposition: A | Discharge status: Home / Self Care | Payer: Medicare Other | Source: Ambulatory Visit | Attending: Nephrology | Admitting: Nephrology

## 2022-10-28 VITALS — BP 144/59 | HR 60 | Temp 98.4°F | Resp 20

## 2022-10-28 DIAGNOSIS — N189 Chronic kidney disease, unspecified: Secondary | ICD-10-CM | POA: Diagnosis not present

## 2022-10-28 DIAGNOSIS — D500 Iron deficiency anemia secondary to blood loss (chronic): Secondary | ICD-10-CM

## 2022-10-28 DIAGNOSIS — D631 Anemia in chronic kidney disease: Secondary | ICD-10-CM | POA: Diagnosis present

## 2022-10-28 DIAGNOSIS — I739 Peripheral vascular disease, unspecified: Secondary | ICD-10-CM

## 2022-10-28 LAB — POCT HEMOGLOBIN-HEMACUE: Hemoglobin: 7 g/dL — ABNORMAL LOW (ref 13.0–17.0)

## 2022-10-28 MED ORDER — ACETAMINOPHEN 325 MG PO TABS: 650.0000 mg | ORAL_TABLET | Freq: Once | ORAL | Status: DC

## 2022-10-28 MED ORDER — SODIUM CHLORIDE 0.9 % IV SOLN
510.0000 mg | Freq: Once | INTRAVENOUS | Status: AC
  Administered 2022-10-28: 510 mg via INTRAVENOUS
  Filled 2022-10-28: qty 17

## 2022-10-28 MED ORDER — DIPHENHYDRAMINE HCL 25 MG PO CAPS
25.0000 mg | ORAL_CAPSULE | Freq: Once | ORAL | Status: AC
  Administered 2022-10-28: 25 mg via ORAL
  Filled 2022-10-28: qty 1

## 2022-10-28 MED ORDER — EPOETIN ALFA-EPBX 3000 UNIT/ML IJ SOLN: 3000.0000 [IU] | Freq: Once | INTRAMUSCULAR | Status: DC

## 2022-10-28 NOTE — Telephone Encounter (Signed)
[place] [facility] [phone]) and spoke with patient's nurse, Tiffany RN. Communicated that patient's hemoglobin value today was 7.0. Relayed that patient stated he had experienced ongoing dizziness after a recent hospitalization, but denied acute changes in dizziness or dyspnea. VSS post infusion. RN verbalized understanding and stated she would communicate Hgb value to facility NP, Linus Orn [phone]) and ok to send patient back to facility.   Updated patient and accompanying caregiver, [place]. Information documented on report of consultation sent back to [facility].  Binnie Kand, RN

## 2022-10-28 NOTE — Telephone Encounter (Signed)
Dr. Theador Hawthorne, Juluis Rainier note:   Auth Submission: NO AUTH NEEDED Site of care: @[facility]  Payer: MEDICARE A/B Medication & CPT/J Code(s) submitted: Feraheme (ferumoxytol) [id number] Route of submission (phone, fax, portal):  Phone # Fax # Auth type: Buy/Bill Units/visits requested: 2 Reference number:  Approval from: 10/28/22 to 02/27/23

## 2022-10-28 NOTE — Progress Notes (Addendum)
Diagnosis: Anemia in Chronic Kidney Disease  Provider:  Manpreet Bhutani MD  Procedure: Infusion  IV Type: Peripheral, IV Location: L Forearm  Feraheme (Ferumoxytol), Dose: 510 mg  Infusion Start Time: [id number]  Infusion Stop Time: [id number]  Post Infusion IV Care: Observation period completed and Peripheral IV Discontinued  Discharge: Condition: Good, Destination: Skilled nursing facility . AVS Provided  Performed by:  Baxter Hire, RN   Patient was initially scheduled to start his retacrit injections today, but it was pharmacy's recommendation to wait until after he received both of his feraheme infusions to get the maximum benefit of the retacrit. He will receive his second infusion next Monday 4/1 and is scheduled to begin his retacrit injections on 4/15.

## 2022-10-28 NOTE — Telephone Encounter (Signed)
Patient scheduled for aortogram on April 4. Instructions faxed to [facility] ([facility]).

## 2022-10-28 NOTE — Telephone Encounter (Signed)
Samantha from [facility] called stating that over the weekend, the pt's R second toe is warm, red, and swollen. The wound has also opened up, no drainage. The provider there prescribed Doxycycline 100 mg BID x 10 days starting on 3/23.  Pt last seen on 09/04/22 by Dr. Donnetta Hutching and an arteriogram was recommended if the pt's wound worsened. Confirmed with Dr. Trula Slade whether pt should be seen in office again prior to procedure or go ahead and schedule. He stated to proceed with surgery. Informed Photographer, surgery scheduler. Confirmed understanding.

## 2022-10-29 ENCOUNTER — Encounter (HOSPITAL_COMMUNITY): Payer: Self-pay | Admitting: Nephrology

## 2022-11-04 ENCOUNTER — Encounter (HOSPITAL_COMMUNITY): Admission: RE | Admit: 2022-11-04 | Payer: Medicare Other | Source: Ambulatory Visit

## 2022-11-04 ENCOUNTER — Encounter (HOSPITAL_COMMUNITY)
Admission: RE | Admit: 2022-11-04 | Discharge: 2022-11-04 | Disposition: A | Discharge status: Home / Self Care | Payer: Medicare Other | Source: Ambulatory Visit | Attending: Nephrology | Admitting: Nephrology

## 2022-11-04 VITALS — BP 152/59 | HR 66 | Temp 98.0°F | Resp 20

## 2022-11-04 DIAGNOSIS — D500 Iron deficiency anemia secondary to blood loss (chronic): Secondary | ICD-10-CM | POA: Diagnosis present

## 2022-11-04 DIAGNOSIS — N184 Chronic kidney disease, stage 4 (severe): Secondary | ICD-10-CM | POA: Insufficient documentation

## 2022-11-04 DIAGNOSIS — D631 Anemia in chronic kidney disease: Secondary | ICD-10-CM | POA: Diagnosis present

## 2022-11-04 LAB — PREPARE RBC (CROSSMATCH)

## 2022-11-04 MED ORDER — SODIUM CHLORIDE 0.9% IV SOLUTION: Freq: Once | INTRAVENOUS | Status: AC

## 2022-11-04 NOTE — Progress Notes (Addendum)
Diagnosis: Anemia in Chronic Kidney Disease  Provider:  Manpreet Bhutani MD  Procedure: Infusion  IV Type: Peripheral, IV Location: R Forearm  PRBCs, Dose: 1 Unit  Infusion Start Time: [id number]  Infusion Stop Time: [id number]  Post Infusion IV Care: Patient declined observation and Peripheral IV Discontinued  Discharge: Condition: Good, Destination: Skilled nursing facility . AVS Provided  Performed by:  Baxter Hire, RN

## 2022-11-05 ENCOUNTER — Encounter: Payer: Self-pay | Admitting: Neurology

## 2022-11-05 ENCOUNTER — Ambulatory Visit (INDEPENDENT_AMBULATORY_CARE_PROVIDER_SITE_OTHER): Payer: Medicare Other | Admitting: Neurology

## 2022-11-05 VITALS — Ht 70.5 in | Wt 217.0 lb

## 2022-11-05 DIAGNOSIS — R42 Dizziness and giddiness: Secondary | ICD-10-CM | POA: Diagnosis not present

## 2022-11-05 DIAGNOSIS — G4730 Sleep apnea, unspecified: Secondary | ICD-10-CM

## 2022-11-05 DIAGNOSIS — I951 Orthostatic hypotension: Secondary | ICD-10-CM | POA: Diagnosis not present

## 2022-11-05 DIAGNOSIS — I63031 Cerebral infarction due to thrombosis of right carotid artery: Secondary | ICD-10-CM | POA: Diagnosis not present

## 2022-11-05 DIAGNOSIS — G629 Polyneuropathy, unspecified: Secondary | ICD-10-CM

## 2022-11-05 LAB — TYPE AND SCREEN
ABO/RH(D): B NEG
Antibody Screen: NEGATIVE
Unit division: 0

## 2022-11-05 LAB — BPAM RBC
Blood Product Expiration Date: 202405042359
ISSUE DATE / TIME: 202404011121
Unit Type and Rh: 1700

## 2022-11-05 NOTE — Progress Notes (Signed)
[id number]    Provider:  Dr Jaynee Eagles Referring Provider: Wendie Agreste, NP Primary Care Physician:  Nanine Means  CC:  Vertigo  11/05/2022: I saw patient in 2018 for vertigo and he declined any workup here today for dizziness on standing and walking and changing positions and Is extremely orthostatic (drops [id number] systolic), MRI [id number] showed stable changes in the brain since 2017, I saw his old strokes but nothing new (which is good) or any causes in the brain for his vertigo. However, I did see some sinus disease and some effusions in his mastoids(bone behind the ears) and wondered if this is a source of his vertigo. I suggested ENT referral just to make sure,he declined. He declined vestibular therapy and ENT. Also wakes up confused, he has untreated sleep apnea may be hypercapnic at night or hypoxic. He refuses ONO or oxygen overnight or sleep test or MRI brain today. He reports No memory problem that he thinks are more than stated age. Tired all the time. Lots of snoring. Dizziness. Visual distirbances only when wakes up and he knows they are not real, they go away, doesn't happen during the day, Again I recommend a sleep study declines and an mri declines. Here with his caretaker  Today he is dizzy on standing and walking: He is significantly orthostatic Laying down 170/86 p71 Sit up 129/64 p72 Stand 139/51 p70 Standing a few minutes BP 117/53 72  Patient complains of symptoms per HPI as well as the following symptoms: dizzy . Pertinent negatives and positives per HPI. All others negative   Reviewed notes, labs and imaging from outside physicians, which showed:  05/2017: [facility] reviewed images and agree [address], [place], [place] [address] [phone]   NEUROIMAGING REPORT     STUDY DATE: 05/16/2017 PATIENT NAME: Oscar Rose DOB: 05-29-1961 MRN: [id number]   EXAM: MRI Brain with and without contrast   ORDERING  CLINICIAN: Sarina Ill M.D. CLINICAL HISTORY: 62 year old man with history of stroke with vertigo COMPARISON FILMS: MRI 02/14/2016   TECHNIQUE:MRI of the brain with and without contrast was obtained utilizing 5 mm axial slices with T1, T2, T2 flair, SWI and diffusion weighted views.  T1 sagittal, T2 coronal and postcontrast views in the axial and coronal plane were obtained. CONTRAST: 20 ml [employer] IMAGING SITE: [facility], [place].   FINDINGS: On sagittal images, the spinal cord is imaged caudally to C3 and is normal in caliber.   The contents of the posterior fossa are of normal size and position.   The pituitary gland and optic chiasm appear normal.    Brain volume appears normal.   The ventricles are normal in size and without distortion.  There are no abnormal extra-axial collections of fluid.     The cerebellum and brainstem appears normal.   There are chronic lacunar infarctions in the head of the right caudate and within the right internal capsule. There are several foci of chronic stroke in the right frontal lobe and to a lesser extent at the right temporal occipital junction.. Minimal hemosiderin deposition is also noted at the sites. The chronic ischemic foci on the current MRI correspond to the acute strokes noted on 02/14/2016 MRI.Marland Kitchen   Diffusion weighted images are normal.        The orbits appear normal.   The VIIth/VIIIth nerve complex appears normal.  Right greater than left mastoid effusions are noted..  Mild mucoperiosteal thickening is noted in the maxillary  sinuses. The other paranasal sinuses appear normal.  Flow voids are identified within the major intracerebral arteries.  The right transverse sinus, sagittal sinus and internal jugular vein are dominant.   After the infusion of contrast material, a normal enhancement pattern is noted.     IMPRESSION:  This MRI of the brain with and without contrast shows the following: 1.    Chronic infarctions in  the right hemisphere and chronic lacunar infarction in the head of the right caudate and in the right internal capsule. These were acute on the 02/14/2016 MRI. Some hemosiderin deposition is noted in some of the hemispheric locations. 2.    Small mastoid effusions, also noted on the previous MRI. Mild chronic maxillary sinusitis. 3.    There are no acute findings.    There is a normal enhancement pattern.         INTERPRETING PHYSICIAN:  Richard A. Felecia Shelling, MD, PhD, FAAN Certified in  Neuroimaging by [employer]     Recent Results (from the past 2160 hour(s))  VAS [place] ABI WITH/WO TBI     Status: None   Collection Time: 09/04/22  9:27 AM  Result Value Ref Range   Right ABI 0.66    Left ABI 1.01   Hemoglobin-hemacue, POC     Status: Abnormal   Collection Time: 10/28/22  9:00 AM  Result Value Ref Range   Hemoglobin 7.0 (L) 13.0 - 17.0 g/dL  Type and screen [facility]     Status: None   Collection Time: 11/04/22  9:56 AM  Result Value Ref Range   ABO/RH(D) B NEG    Antibody Screen NEG    Sample Expiration 11/07/2022,2359    Unit Number [id number]    Blood Component Type RED CELLS,LR    Unit division 00    Status of Unit ISSUED,FINAL    Transfusion Status OK TO TRANSFUSE    Crossmatch Result      Compatible Performed at [facility], [address] [place], [place] [address]   Prepare RBC (crossmatch)     Status: None   Collection Time: 11/04/22  9:56 AM  Result Value Ref Range   Order Confirmation      ORDER PROCESSED BY BLOOD BANK Performed at [facility], [address], [place], [place] [address]   BPAM RBC     Status: None   Collection Time: 11/04/22  9:56 AM  Result Value Ref Range   ISSUE DATE / TIME [id number]    Blood Product Unit Number [id number]    PRODUCT CODE [id number]    Unit Type and Rh 1700    Blood Product Expiration Date [id number]          HPI 04/30/2017:  Oscar Rose is a 62 y.o. male here as a referral  from Dr. Lilia Pro for vertigo. He has a past medical history of hypertension, diabetes, depression, hyperlipidemia, cataract, kidney stones, toe amputation, upper back surgery and stroke.Patient had a stroke and 2017 due to symptomatic right carotid high-grade stenosis presented with left-sided weakness and numbness and left homonymous hemianopsia. Risk factors included hypertension, diabetes, dyslipidemia, untreated sleep apnea syndrome and obesity. He is status post carotid endarterectomy. Patient reports vertigo in bed when turning over. Would last a few minutes. Not moving helped, moving more slowly helped. Room spinning. No nausea or vomiting, episodes last a few minutes. Better with treatment of sinuses with nasal spray, may be due to sinus infection. Started worsening last month in the setting  of sinus problems. No new numbness or new weakness or new vision or speech changes or other associated symptoms. He has untreated OSA. No headaches. No other focal neurologic deficits, associated symptoms, inciting events or modifiable factors.  Reviewed notes, labs and imaging from outside physicians, which showed:  MRI brain/MRA head : 1. Several areas of acute/early subacute infarction within the right MCA distribution as described corresponding to areas of hypoattenuation on the prior CT of the head. No evidence of intracranial hemorrhage. 2. No large vessel occlusion, high-grade stenosis, or aneurysm of the circle of Willis is identified.    Patient has intermittent dizziness, he has been using his nose spray, mostly positional when he turns over in bed, he almost fell out of bed early because of the vertigo, no heat headache no hearing loss. He's had this on and off over the past year. About a year ago that his CVA was seen by gastroenterology, subsequently seen by neurosurgery. His hypertension is well controlled, blood sugars are much better, is followed by endocrinology. Vertiginous symptoms  probably labyrinthitis. He was given Zofran and Diamox.  Patient had a stroke and 2017 due to symptomatic right carotid high-grade stenosis presented with left-sided weakness and numbness and left homonymous hemianopsia. Risk factors included hypertension, diabetes, dyslipidemia, untreated sleep apnea syndrome and obesity.  Review of Systems: Patient complains of symptoms per HPI as well as the following symptoms: Fatigue, swelling of legs, snoring, dizziness, insomnia, sleepiness, sedation, impotence. Pertinent negatives and positives per HPI. All others negative.   Social History   Socioeconomic History   Marital status: Single    Spouse name: Not on file   Number of children: 0   Years of education: Not on file   Highest education level: Not on file  Occupational History   Not on file  Tobacco Use   Smoking status: Never   Smokeless tobacco: Never  Vaping Use   Vaping Use: Never used  Substance and Sexual Activity   Alcohol use: Not Currently   Drug use: No   Sexual activity: Not on file  Other Topics Concern   Not on file  Social History Narrative   Lives with "wife"      Update 11/05/2022   Lives at [facility] rehab currently   Right handed   Caffeine: none    Social Determinants of Health   Financial Resource Strain: Not on file  Food Insecurity: No Food Insecurity (05/13/2022)   Hunger Vital Sign    Worried About Running Out of Food in the Last Year: Never true    Ran Out of Food in the Last Year: Never true  Transportation Needs: No Transportation Needs (05/13/2022)   PRAPARE - Hydrologist (Medical): No    Lack of Transportation (Non-Medical): No  Physical Activity: Not on file  Stress: Not on file  Social Connections: Not on file  Intimate Partner Violence: Not At Risk (05/13/2022)   Humiliation, Afraid, Rape, and Kick questionnaire    Fear of Current or Ex-Partner: No    Emotionally Abused: No    Physically Abused: No     Sexually Abused: No    Family History  Problem Relation Age of Onset   Emphysema Mother    Cancer Mother        Throat   Heart disease Mother    Stroke Neg Hx     Past Medical History:  Diagnosis Date   Anemia    ARDS (adult respiratory distress  syndrome)    Arthritis    At risk for sleep apnea    STOP-BANG= 7    SENT TO PCP 06-29-2014   CKD (chronic kidney disease), stage II    montitored by nephrologist   Depression        Diabetic neuropathy    GERD (gastroesophageal reflux disease)    Headache    SINUS   History of kidney stones    History of retinal detachment    Hyperlipidemia    Hypertension    Legal blindness of left eye, 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  STROKE  JULY  2017   Tinea unguium    Type 2 diabetes mellitus    Vitamin D deficiency     Past Surgical History:  Procedure Laterality Date   ABDOMINAL AORTOGRAM W/LOWER EXTREMITY N/A 11/08/2021   Procedure: ABDOMINAL AORTOGRAM W/LOWER EXTREMITY;  Surgeon: Marty Heck, MD;  Location: [place] [facility];  Service: Cardiovascular;  Laterality: N/A;   AMPUTATION Bilateral 2012   Left big toe partial and right big toe complete   CARDIOVASCULAR STRESS TEST  04-05-2014  dr croitoru   low risk lexiscan nuclear study with mild diaphragmatic attenuation artifact/  no ischemia/  ef 58%   CATARACT EXTRACTION W/ INTRAOCULAR LENS  IMPLANT, BILATERAL  2013   CYSTO /  LEFT URETERAL STENT PLACEMENT  2012   CYSTOSCOPY W/ URETERAL STENT REMOVAL Left 07/07/2014   Procedure: CYSTO WITH LEFT PORTION STENT REMOVAL;  Surgeon: Malka So, MD;  Location: [facility];  Service: Urology;  Laterality: Left;   CYSTOSCOPY WITH URETEROSCOPY AND STENT PLACEMENT Left  07/07/2014   Procedure: CYSTOLITHALOPAXY URETEROSCOPY WITH STENT;  Surgeon: Malka So, MD;  Location: [facility];  Service: Urology;  Laterality: Left;   ENDARTERECTOMY Right 04/04/2016   Procedure: ENDARTERECTOMY CAROTID ARTERY RIGHT;  Surgeon: Angelia Mould, MD;  Location: [place];  Service: Vascular;  Laterality: Right;   HOLMIUM LASER APPLICATION N/A [id number]   Procedure: HOLMIUM LASER APPLICATION;  Surgeon: Malka So, MD;  Location[facility];  Service: Urology;  Laterality: N/A;   I & D  INFECTED SPIDER BITE UPPER BACK  06/ 2012   NEPHROLITHOTOMY Left 08/16/2014   Procedure: LEFT NEPHROLITHOTOMY PERCUTANEOUS;  Surgeon: Malka So, MD;  Location: [facility];  Service: Urology;  Laterality: Left;   NEPHROLITHOTOMY Left 08/18/2014   Procedure: LEFT NEPHROLITHOTOMY PERCUTANEOUS SECOND LOOK;  Surgeon: Malka So, MD;  Location: [facility];  Service: Urology;  Laterality: Left;   PATCH ANGIOPLASTY Right 04/04/2016   Procedure: PATCH ANGIOPLASTY RIGHT CAROTID ARTERY;  Surgeon: Angelia Mould, MD;  Location: [place];  Service: Vascular;  Laterality: Right;   PERIPHERAL VASCULAR BALLOON ANGIOPLASTY  11/08/2021   Procedure: PERIPHERAL VASCULAR BALLOON ANGIOPLASTY;  Surgeon: Marty Heck, MD;  Location: [place] [facility];  Service: Cardiovascular;;  Left AT   RETINAL DETACHMENT SURGERY Bilateral 2013   TONSILLECTOMY AND ADENOIDECTOMY  as child    Current Outpatient Medications  Medication Sig Dispense Refill   Ascorbic Acid (VITAMIN C PO) Take 1 tablet by mouth 2 (two) times daily.  aspirin EC 81 MG tablet Take 81 mg by mouth daily.     atorvastatin (LIPITOR) 40 MG tablet Take 40 mg by mouth at bedtime.     Baclofen 5 MG TABS Take 5 mg by mouth 3 (three) times daily.     busPIRone (BUSPAR) 5 MG tablet Take 5 mg by mouth 2 (two) times daily.     cloNIDine (CATAPRES - DOSED IN MG/24 HR) 0.3 mg/24hr patch Place 1 patch (0.3 mg total) onto the skin  once a week. (Patient taking differently: Place 0.3 mg onto the skin once a week. Every Sunday) 4 patch 12   clopidogrel (PLAVIX) 75 MG tablet Take 1 tablet (75 mg total) by mouth daily. 30 tablet 11   doxycycline (VIBRAMYCIN) 100 MG capsule Take 100 mg by mouth 2 (two) times daily. X 10 days     ferrous sulfate 325 (65 FE) MG tablet Take 325 mg by mouth daily with breakfast.     furosemide (LASIX) 40 MG tablet Take 1 tablet (40 mg total) by mouth daily. 30 tablet 1   insulin glargine (LANTUS) 100 UNIT/ML injection Inject 0.2 mLs (20 Units total) into the skin at bedtime. 10 mL 11   insulin lispro (ADMELOG) 100 UNIT/ML injection Inject 3 Units into the skin 3 (three) times daily before meals. Hold for blood sugar <200     labetalol (NORMODYNE) 200 MG tablet Take 1 tablet (200 mg total) by mouth 2 (two) times daily. 60 tablet 0   meclizine (ANTIVERT) 25 MG tablet Take 25 mg by mouth every 8 (eight) hours.     Multiple Vitamin (MULTIVITAMIN) tablet Take 1 tablet by mouth daily.     sertraline (ZOLOFT) 25 MG tablet Take 1 tablet (25 mg total) by mouth daily. (Patient taking differently: Take 75 mg by mouth daily.) 30 tablet 0   ondansetron (ZOFRAN) 4 MG tablet Take 4 mg by mouth every 6 (six) hours as needed for nausea or vomiting.     OXYGEN Inhale 4 L into the lungs as needed (for hyoxia).     tamsulosin (FLOMAX) 0.4 MG CAPS capsule Take 0.4 mg by mouth at bedtime.     No current facility-administered medications for this visit.    Allergies as of 11/05/2022   (No Known Allergies)    Vitals: Ht 5' 10.5" (1.791 m)   Wt 217 lb (98.4 kg)   BMI 30.70 kg/m  Last Weight:  Wt Readings from Last 1 Encounters:  11/05/22 217 lb (98.4 kg)   Last Height:   Ht Readings from Last 1 Encounters:  11/05/22 5' 10.5" (1.791 m)  Physical exam: Exam: Gen: NAD, conversant, well nourised, obese, well groomed                     CV: RRR, no MRG. No Carotid Bruits. No peripheral edema, warm,  nontender Eyes: Conjunctivae clear without exudates or hemorrhage  Neuro: Detailed Neurologic Exam  Speech:    Speech is normal; fluent and spontaneous with normal comprehension.  Cognition:    The patient is oriented to person, place, and time;     recent and remote memory intact;     language fluent;     normal attention, concentration,     fund of knowledge Cranial Nerves:    The pupils are pinpoint.  Attempted fundoscopic exam could not visualize small pupils. Left eye blind ; Extraocular movements are intact. Trigeminal sensation is intact and the muscles of mastication are normal. The face  is symmetric. The palate elevates in the midline. Hearing intact. Voice is normal. Shoulder shrug is normal. The tongue has normal motion without fasciculations.   Coordination:    No ataxia or dysmetria  Gait:    Wheelchair, good stride  Motor Observation:    No asymmetry, no atrophy, and no involuntary movements noted. Tone:    Normal muscle tone.    Posture:    Posture is normal. normal erect    Strength:    Strength is V/V in the upper and lower limbs.      Sensation: intact to LT     Reflex Exam:  DTR's:    Deep tendon reflexes in the upper normal and hypo lower extremities are normal bilaterally.   Toes:    The toes are amputated bilaterally.   Clonus:    Clonus is absent.      Assessment/Plan:   I saw patient in 2018 for vertigo and he declined workup here today for dizziness and Is extremely orthostatic (drops 123XX123 systolic),  Dizziness when changing positions or standing: needs to work with pcp on medical management, He is significantly orthostatic: Laying down 170/86 p71 Sit up 129/64 p72 Stand 139/51 p70 Standing a few minutes BP 117/53 72 - MRI in 2018 stable from changes in the brain since 2017,in 2018 there was some sinus disease and some effusions in his mastoids(bone behind the ears) and wonder if this was/is contributory of his vertigo.he declined  ENT. - In 2018 and today, I suggested an ENT,he declined then and now. He declined vestibular therapy as well.  - Also wakes up confused, he has untreated sleep apnea maybe hypercapnic at night or hypoxic. He refuses ONO testing overnight or oxygen overnight. No memory problem that he thinks are more than stated age. Tired all the time. Lots of snoring. Dizziness. Visual distirbances only when wakes up and he knows they are not real, they go away, doesn't happen during the day, recommend a sleep study and an mri/MRA and he declines.   -Patient had a stroke and 2017 due to symptomatic right carotid high-grade stenosis presented with left-sided weakness and numbness and left homonymous hemianopsia. Risk factors included hypertension, diabetes, dyslipidemia, untreated sleep apnea syndrome and obesity. He has untreated OSA, discussed severe sequelae including stroke, CV disease but he declines any more workup or using his cpap.    - work with pcp for orthostatic hypotension - treat sleep apnea, recommend a test - recommend repeat mri brain and blood vessel imaging of head and neck - sees vascular - follow up as needed (doesn't want an appt)  Call belinda [phone]  For any new symptoms or worsening symptoms or concerning symptoms call 911 or go to the emergency room  I had a long d/w patient about his past stroke, risk for recurrent stroke/TIAs, personally independently reviewed imaging studies and stroke evaluation results and answered questions. Recommend ASA 325mg  for secondary stroke prevention and maintain strict control of hypertension with blood pressure goal below 130/90, diabetes with hemoglobin A1c goal below 6.5% and lipids with LDL cholesterol goal below 70 mg/dL. I also advised the patient to eat a healthy diet with plenty of whole grains, cereals, fruits and vegetables, exercise regularly and maintain ideal body weight .Followup in the future as necessary.  Highly recommend treating  OSA, he declines Monitor his anemia  Cc: Dr. Abel Presto, Burnis Medin, MD  [facility] [address] [place], [place] [address]  Phone [phone] Fax  [phone]  I spent 60 minutes of face-to-face and non-face-to-face time with patient on the  1. Orthostatic hypotension   2. Dizziness   3. untreated Sleep apnea, unspecified type   4. 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 Morbid obesity   6. Neuropathy    diagnosis.  This included previsit chart review, lab review, study review, order entry, electronic health record documentation, patient education on the different diagnostic and therapeutic options, counseling and coordination of care, risks and benefits of management, compliance, or risk factor reduction

## 2022-11-05 NOTE — Patient Instructions (Signed)
-   work with pcp for orthostatic hypotension - treat sleep apnea, recommend a test - recommend repeat mri brain and blood vessel imaging o rhead and neck - sees vascular - follow up as needed   Non-Drug Treatment for Low Blood Pressure on Standing:  1. Changing Postures:  Change posture slowly when getting up, especially in the morning  Hold on to something during the first few minutes after standing up. Do not start walking as soon as you get up from the chair  Avoid prolonged recumbency or lying down  Raise the head of the bed by 10 to 20 degrees  2. Exercise:  Perform Isotonic exercise, e.g.recumbent bike, pedaling movements while sitting in a chair  Avoid exercises where you have to strain   3. Avoid Pooling of blood in legs:  Wear custom-fitted elastic stockings. The ones which extend to the abdomen work even better. Consider wearing an abdominal binder.  Perform physical counter-maneuvers, such as crossing legs and tensing leg muscles.  4. Eating and Drinking:  Small meals are recommended. Avoid large meals. Avoid standing suddenly after a large meal  Avoid alcohol  Increase intake of fluids and regular salt. A daily intake of up to 10 grams of sodium per day and a fluid intake of 2.0 to 2.5 liters per day (8 to 10 glasses of water) is recommended.   Rapid (over 3 minutes) ingestion of approximately 0.5 liter (2 glasses of water) of tap water, raises blood pressure within 5 to 15 minutes and lasts for an hour.  5. Other Tips:  Avoid hot baths. Instead take warm baths  Maintain a BP record standing and lying down  If you are only any BP lowering drugs (antihypertensives, diuretics, antidepressants, drugs for prostate, etc) ask your doctor to revisit the need to keep you on these drugs  If all non-drug therapy fails, ask your doctor about drug therapy   Follow-up with primary care physician.

## 2022-11-07 ENCOUNTER — Encounter (HOSPITAL_COMMUNITY)
Admission: RE | Disposition: A | Discharge status: Rehabilitation Facility | Payer: Self-pay | Source: Home / Self Care | Attending: Vascular Surgery

## 2022-11-07 ENCOUNTER — Other Ambulatory Visit: Payer: Self-pay

## 2022-11-07 ENCOUNTER — Ambulatory Visit (HOSPITAL_COMMUNITY)
Admission: RE | Admit: 2022-11-07 | Discharge: 2022-11-07 | Disposition: A | Discharge status: Rehabilitation Facility | Payer: Medicare Other | Attending: Vascular Surgery | Admitting: Vascular Surgery

## 2022-11-07 DIAGNOSIS — E1151 Type 2 diabetes mellitus with diabetic peripheral angiopathy without gangrene: Secondary | ICD-10-CM | POA: Insufficient documentation

## 2022-11-07 DIAGNOSIS — Z993 Dependence on wheelchair: Secondary | ICD-10-CM | POA: Insufficient documentation

## 2022-11-07 DIAGNOSIS — Z794 Long term (current) use of insulin: Secondary | ICD-10-CM | POA: Diagnosis not present

## 2022-11-07 DIAGNOSIS — L97519 Non-pressure chronic ulcer of other part of right foot with unspecified severity: Secondary | ICD-10-CM | POA: Insufficient documentation

## 2022-11-07 DIAGNOSIS — E11621 Type 2 diabetes mellitus with foot ulcer: Secondary | ICD-10-CM | POA: Insufficient documentation

## 2022-11-07 DIAGNOSIS — Z8673 Personal history of transient ischemic attack (TIA), and cerebral infarction without residual deficits: Secondary | ICD-10-CM | POA: Insufficient documentation

## 2022-11-07 DIAGNOSIS — I70235 Atherosclerosis of native arteries of right leg with ulceration of other part of foot: Secondary | ICD-10-CM | POA: Insufficient documentation

## 2022-11-07 DIAGNOSIS — I739 Peripheral vascular disease, unspecified: Secondary | ICD-10-CM

## 2022-11-07 HISTORY — PX: PERIPHERAL VASCULAR BALLOON ANGIOPLASTY: CATH118281

## 2022-11-07 HISTORY — PX: ABDOMINAL AORTOGRAM W/LOWER EXTREMITY: CATH118223

## 2022-11-07 LAB — POCT I-STAT, CHEM 8
BUN: 65 mg/dL — ABNORMAL HIGH (ref 6–20)
BUN: 93 mg/dL — ABNORMAL HIGH (ref 6–20)
Calcium, Ion: 1.17 mmol/L (ref 1.15–1.40)
Calcium, Ion: 1.21 mmol/L (ref 1.15–1.40)
Chloride: 107 mmol/L (ref 98–111)
Chloride: 107 mmol/L (ref 98–111)
Creatinine, Ser: 2.5 mg/dL — ABNORMAL HIGH (ref 0.61–1.24)
Creatinine, Ser: 2.6 mg/dL — ABNORMAL HIGH (ref 0.61–1.24)
Glucose, Bld: 91 mg/dL (ref 70–99)
Glucose, Bld: 91 mg/dL (ref 70–99)
HCT: 30 % — ABNORMAL LOW (ref 39.0–52.0)
HCT: 33 % — ABNORMAL LOW (ref 39.0–52.0)
Hemoglobin: 10.2 g/dL — ABNORMAL LOW (ref 13.0–17.0)
Hemoglobin: 11.2 g/dL — ABNORMAL LOW (ref 13.0–17.0)
Potassium: 4.5 mmol/L (ref 3.5–5.1)
Potassium: 5.8 mmol/L — ABNORMAL HIGH (ref 3.5–5.1)
Sodium: 138 mmol/L (ref 135–145)
Sodium: 140 mmol/L (ref 135–145)
TCO2: 23 mmol/L (ref 22–32)
TCO2: 23 mmol/L (ref 22–32)

## 2022-11-07 SURGERY — ABDOMINAL AORTOGRAM W/LOWER EXTREMITY
Anesthesia: LOCAL | Laterality: Right

## 2022-11-07 MED ORDER — HYDRALAZINE HCL 20 MG/ML IJ SOLN: 5.0000 mg | INTRAMUSCULAR | Status: DC | PRN

## 2022-11-07 MED ORDER — IODIXANOL 320 MG/ML IV SOLN
INTRAVENOUS | Status: DC | PRN
  Administered 2022-11-07: 20 mL

## 2022-11-07 MED ORDER — SODIUM CHLORIDE 0.9 % IV SOLN: INTRAVENOUS | Status: DC

## 2022-11-07 MED ORDER — HEPARIN SODIUM (PORCINE) 1000 UNIT/ML IJ SOLN
INTRAMUSCULAR | Status: DC | PRN
  Administered 2022-11-07: 10000 [IU] via INTRAVENOUS

## 2022-11-07 MED ORDER — MIDAZOLAM HCL 2 MG/2ML IJ SOLN
INTRAMUSCULAR | Status: DC | PRN
  Administered 2022-11-07: 1 mg via INTRAVENOUS

## 2022-11-07 MED ORDER — SODIUM CHLORIDE 0.9% FLUSH: 3.0000 mL | INTRAVENOUS | Status: DC | PRN

## 2022-11-07 MED ORDER — MIDAZOLAM HCL 2 MG/2ML IJ SOLN
INTRAMUSCULAR | Status: AC
  Filled 2022-11-07: qty 2

## 2022-11-07 MED ORDER — HEPARIN (PORCINE) IN NACL 1000-0.9 UT/500ML-% IV SOLN
INTRAVENOUS | Status: DC | PRN
  Administered 2022-11-07 (×2): 500 mL

## 2022-11-07 MED ORDER — LIDOCAINE HCL (PF) 1 % IJ SOLN
INTRAMUSCULAR | Status: AC
  Filled 2022-11-07: qty 30

## 2022-11-07 MED ORDER — FENTANYL CITRATE (PF) 100 MCG/2ML IJ SOLN
INTRAMUSCULAR | Status: DC | PRN
  Administered 2022-11-07: 25 ug via INTRAVENOUS

## 2022-11-07 MED ORDER — FENTANYL CITRATE (PF) 100 MCG/2ML IJ SOLN
INTRAMUSCULAR | Status: AC
  Filled 2022-11-07: qty 2

## 2022-11-07 MED ORDER — SODIUM CHLORIDE 0.9 % IV SOLN: 250.0000 mL | INTRAVENOUS | Status: DC | PRN

## 2022-11-07 MED ORDER — ONDANSETRON HCL 4 MG/2ML IJ SOLN: 4.0000 mg | Freq: Four times a day (QID) | INTRAMUSCULAR | Status: DC | PRN

## 2022-11-07 MED ORDER — SODIUM CHLORIDE 0.9% FLUSH: 3.0000 mL | Freq: Two times a day (BID) | INTRAVENOUS | Status: DC

## 2022-11-07 MED ORDER — ACETAMINOPHEN 325 MG PO TABS: 650.0000 mg | ORAL_TABLET | ORAL | Status: DC | PRN

## 2022-11-07 MED ORDER — LABETALOL HCL 5 MG/ML IV SOLN: 10.0000 mg | INTRAVENOUS | Status: DC | PRN

## 2022-11-07 SURGICAL SUPPLY — 20 items
BALLN STERLING OTW 3X150X150 (BALLOONS) ×1
BALLOON STERLING OTW 3X150X150 (BALLOONS) IMPLANT
CATH OMNI FLUSH 5F 65CM (CATHETERS) IMPLANT
CATH QUICKCROSS ANG SELECT (CATHETERS) IMPLANT
CATH SYNTRAX .018X150 (CATHETERS) IMPLANT
DEVICE CLOSURE MYNXGRIP 5F (Vascular Products) IMPLANT
KIT ANGIASSIST CO2 SYSTEM (KITS) IMPLANT
KIT ENCORE 26 ADVANTAGE (KITS) IMPLANT
KIT MICROPUNCTURE NIT STIFF (SHEATH) IMPLANT
KIT PV (KITS) ×1 IMPLANT
SHEATH CATAPULT 5FR 60 (SHEATH) IMPLANT
SHEATH PINNACLE 5F 10CM (SHEATH) IMPLANT
SHEATH PROBE COVER 6X72 (BAG) IMPLANT
STOPCOCK MORSE 400PSI 3WAY (MISCELLANEOUS) IMPLANT
SYR MEDRAD MARK 7 150ML (SYRINGE) ×1 IMPLANT
TRANSDUCER W/STOPCOCK (MISCELLANEOUS) ×1 IMPLANT
TRAY PV CATH (CUSTOM PROCEDURE TRAY) ×1 IMPLANT
TUBING CIL FLEX 10 FLL-RA (TUBING) IMPLANT
WIRE G V18X300CM (WIRE) IMPLANT
WIRE STARTER BENTSON 035X150 (WIRE) IMPLANT

## 2022-11-07 NOTE — Op Note (Signed)
Patient name: Oscar Rose MRN: [id number] DOB: Jun 18, 1961 Sex: male  11/07/2022 Pre-operative Diagnosis: Critical limb ischemia of the right lower extremity with second toe ulcer Post-operative diagnosis:  Same Surgeon:  Marty Heck, MD Procedure Performed: 1.  Ultrasound-guided access left common femoral artery 2.  CO2 aortogram 3.  Right lower extremity arteriogram with selection of third order branches 4.  Right TP trunk and posterior tibial artery angioplasty (3 mm x 150 mm Sterling) 5.  Mynx closure of the left common femoral artery 6.  57 minutes of monitored moderate conscious sedation time  Indications: 62 year old male seen by Dr. Donnetta Hutching in [place] with ulcer that is worsening on his right second toe.  He is able to stand and transfer even though he spends a lot of time in a wheelchair and is in a nursing facility.  He presents for lower extremity arteriogram with possible intervention with a focus on the right leg after risks benefits discussed.  Findings:   CO2 aortogram showed no flow-limiting stenosis in the aortoiliac segment.  Right lower extremity arteriogram with CO2 showed a patent common femoral, profunda, SFA, and above and below-knee popliteal artery.  He has significant tibial disease and all 3 of the tibial arteries are occluded proximally adjacent to the trunk.  Dominant flow into the foot is through posterior tibial that reconstitutes in the mid calf with filling of the plantar arch.  The anterior tibial occludes above the ankle.  Ultimately I was able to cross the TP trunk and get into the posterior tibial through antegrade approach.  This was angioplastied with a 3 mm Sterling with excellent results.  Now has inline flow with widely patent vessel.   Procedure:  The patient was identified in the holding area and taken to room 8.  The patient was then placed supine on the table and prepped and draped in the usual sterile fashion.  A time out was called.   The patient received Versed and fentanyl for conscious moderate sedation.  Vital signs were monitored including heart rate, respiratory rate, oxygenation and blood pressure.  I was present for all of moderate sedation.  Ultrasound was used to evaluate the left common femoral artery.  It was patent .  A digital ultrasound image was acquired.  A micropuncture needle was used to access the left common femoral artery under ultrasound guidance.  An 018 wire was advanced without resistance and a micropuncture sheath was placed.  The 018 wire was removed and a benson wire was placed.  The micropuncture sheath was exchanged for a 5 french sheath.  An omniflush catheter was advanced over the wire to the level of L-1.  An abdominal angiogram was obtained.  Next, using the omniflush catheter and a benson wire, the aortic bifurcation was crossed and the catheter was placed into the right external iliac artery and right leg runoff was obtained.  Ultimately elected for intervention.  We placed a Bentson wire down the right SFA and put a long 5 French Catapault sheath in the left groin over the aortic bifurcation.  Patient was given 100 units/kg IV heparin.  I then used a V18 with a 018 quick cross catheter to get down into the below-knee popliteal artery and cross into the TP trunk and down through the occluded proximal posterior tibial artery.  I confirmed with hand-injection I was in the true lumen distally.  This entire segment was treated with a 3 mm x 150 mm Sterling to nominal pressure for  2 minutes in each segment.  Widely patent vessel at completion with inline flow.  Wires and catheters were removed.  Short 5 French sheath placed in the left groin and a mynx closure device was deployed.   Plan: Excellent results after right TP trunk PT angioplasty.  Now has inline flow for toe amputation.  Will arrange follow-up in 1 month with noninvasive imaging.  Aspirin statin Plavix.  OK to proceed with toe  amputation.  Marty Heck, MD [facility] [place] Office: [phone]

## 2022-11-07 NOTE — Discharge Instructions (Signed)
Femoral Site Care This sheet gives you information about how to care for yourself after your procedure. Your health care provider may also give you more specific instructions. If you have problems or questions, contact your health care provider. What can I expect after the procedure?  After the procedure, it is common to have: Bruising that usually fades within 1-2 weeks. Tenderness at the site. Follow these instructions at home: Wound care Follow instructions from your health care provider about how to take care of your insertion site. Make sure you: Wash your hands with soap and water before you change your bandage (dressing). If soap and water are not available, use hand sanitizer. Remove your dressing as told by your health care provider. In 24 hours Do not take baths, swim, or use a hot tub until your health care provider approves. You may shower 24-48 hours after the procedure or as told by your health care provider. Gently wash the site with plain soap and water. Pat the area dry with a clean towel. Do not rub the site. This may cause bleeding. Do not apply powder or lotion to the site. Keep the site clean and dry. Check your femoral site every day for signs of infection. Check for: Redness, swelling, or pain. Fluid or blood. Warmth. Pus or a bad smell. Activity For the first 2-3 days after your procedure, or as long as directed: Avoid climbing stairs as much as possible. Do not squat. Do not lift anything that is heavier than 10 lb (4.5 kg), or the limit that you are told, until your health care provider says that it is safe. For 5 days Rest as directed. Avoid sitting for a long time without moving. Get up to take short walks every 1-2 hours. Do not drive for 24 hours if you were given a medicine to help you relax (sedative). General instructions Take over-the-counter and prescription medicines only as told by your health care provider. Keep all follow-up visits as told by  your health care provider. This is important. Contact a health care provider if you have: A fever or chills. You have redness, swelling, or pain around your insertion site. Get help right away if: The catheter insertion area swells very fast. You pass out. You suddenly start to sweat or your skin gets clammy. The catheter insertion area is bleeding, and the bleeding does not stop when you hold steady pressure on the area. The area near or just beyond the catheter insertion site becomes pale, cool, tingly, or numb. These symptoms may represent a serious problem that is an emergency. Do not wait to see if the symptoms will go away. Get medical help right away. Call your local emergency services (911 in the [place] Do not drive yourself to the hospital. Summary After the procedure, it is common to have bruising that usually fades within 1-2 weeks. Check your femoral site every day for signs of infection. Do not lift anything that is heavier than 10 lb (4.5 kg), or the limit that you are told, until your health care provider says that it is safe. This information is not intended to replace advice given to you by your health care provider. Make sure you discuss any questions you have with your health care provider. Document Revised: 08/04/2017 Document Reviewed: 08/04/2017 Elsevier Patient Education  2020 Elsevier Inc. 

## 2022-11-07 NOTE — H&P (Addendum)
[facility]    Patient name: Oscar Rose MRN: [id number]        DOB: May 07, 1961            Sex: male      HPI: Oscar Rose is a 62 y.o. male here today for follow-up.  He continues to be a resident of [facility].  He has a prior history of stroke.  He had a recent admission with respiratory failure requiring intubation.  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  He is essentially wheelchair-bound.  He does not have assist with transferring from bed to chair.  He had undergone anterior tibial angioplasty with Dr. Carlis Abbott on 11/08/2021 due to nonhealing left transmetatarsal amputation.  He went on to subsequent complete healing of this.  He had a remote history of right great toe amputation.  He has ulceration over the second toe currently.  He has hammertoe deformity secondary to the great toe metatarsal head resection.  There is a superficial ulceration over the top of his second toe knuckle.  Ports this been there for several months.  It is not causing any pain       Past Medical History:  Diagnosis Date   Anemia     Arthritis     At risk for sleep apnea      STOP-BANG= 7    SENT TO PCP 06-29-2014   CKD (chronic kidney disease), stage II      montitored by nephrologist   Depression          Diabetic neuropathy (HCC)     GERD (gastroesophageal reflux disease)     Headache      SINUS   History of kidney stones     History of retinal detachment     Hyperlipidemia     Hypertension     Legal blindness of left eye, as defined in [place]      SECONDARY TO RETAINAL DETACHMENT   Neuropathy     PVD (peripheral vascular disease) ([place])     Restless leg syndrome     Retained ureteral stent      w/ encrustation SINCE 2012   Rotator cuff syndrome of right shoulder     Sleep apnea in adult     Stroke [facility])      ?  STROKE  JULY  2017   Type 2 diabetes mellitus  (HCC)     Vitamin D deficiency             Family History  Problem Relation Age of Onset   Cancer Mother          Throat   Stroke Neg Hx        SOCIAL HISTORY: Social History         Tobacco Use   Smoking status: Never   Smokeless tobacco: Never  Substance Use Topics   Alcohol use: Not Currently      Comment: Occassionally      No Known Allergies         Current Outpatient Medications  Medication Sig Dispense Refill   aspirin EC 81 MG tablet Take 81 mg by mouth daily.  atorvastatin (LIPITOR) 40 MG tablet Take 40 mg by mouth daily.       cloNIDine (CATAPRES - DOSED IN MG/24 HR) 0.3 mg/24hr patch Place 1 patch (0.3 mg total) onto the skin once a week. 4 patch 12   clopidogrel (PLAVIX) 75 MG tablet Take 1 tablet (75 mg total) by mouth daily. 30 tablet 11   ferrous sulfate 325 (65 FE) MG tablet Take 325 mg by mouth daily with breakfast.       furosemide (LASIX) 40 MG tablet Take 1 tablet (40 mg total) by mouth daily. 30 tablet 1   hydrOXYzine (ATARAX) 25 MG tablet Take 1 tablet (25 mg total) by mouth 3 (three) times daily as needed for anxiety. 30 tablet 0   insulin aspart (NOVOLOG) 100 UNIT/ML injection Inject 0-15 Units into the skin every 4 (four) hours. 10 mL 11   insulin glargine (LANTUS) 100 UNIT/ML injection Inject 0.2 mLs (20 Units total) into the skin at bedtime. 10 mL 11   ipratropium-albuterol (DUONEB) 0.5-2.5 (3) MG/3ML SOLN Take 3 mLs by nebulization every 4 (four) hours as needed. 360 mL 0   Mouthwashes (MOUTH RINSE) LIQD solution 15 mLs by Mouth Rinse route as needed (oral care). 118 mL 0   Multiple Vitamin (MULTIVITAMIN) tablet Take 1 tablet by mouth daily.       OXYGEN Inhale 4 L into the lungs as needed (hypoxia).       docusate sodium (COLACE) 100 MG capsule Take 2 capsules (200 mg total) by mouth 2 (two) times daily. (Patient not taking: Reported on 09/04/2022) 10 capsule 0   labetalol (NORMODYNE) 200 MG tablet Take 1 tablet (200 mg total) by mouth 2  (two) times daily. 60 tablet 0   sertraline (ZOLOFT) 25 MG tablet Take 1 tablet (25 mg total) by mouth daily. 30 tablet 0    No current facility-administered medications for this visit.      REVIEW OF SYSTEMS:  [X]  denotes positive finding, [ ]  denotes negative finding Cardiac   Comments:  Chest pain or chest pressure:      Shortness of breath upon exertion:      Short of breath when lying flat:      Irregular heart rhythm:             Vascular      Pain in calf, thigh, or hip brought on by ambulation:      Pain in feet at night that wakes you up from your sleep:       Blood clot in your veins:      Leg swelling:                  PHYSICAL EXAM:    Vitals:    09/04/22 0907  BP: (!) 161/75  Pulse: 67  Temp: 97.9 F (36.6 C)  SpO2: 97%  Weight: 214 lb (97.1 kg)  Height: 5\' 11"  (1.803 m)      GENERAL: The patient is a well-nourished male, in no acute distress. The vital signs are documented above. CARDIOVASCULAR: 2+ dorsalis pedis pulse on the left.  No palpable pulses on the right PULMONARY: There is good air exchange  MUSCULOSKELETAL: There are no major deformities or cyanosis. NEUROLOGIC: No focal weakness or paresthesias are detected. SKIN: There are no ulcers or rashes noted. PSYCHIATRIC: The patient has a normal affect.   DATA: Ankle arm index on the left is normal with biphasic waveforms.  Ankle arm index on the right is 0.6  Assessment/Plan:  Worsening right lower extremity tissue loss with plan for aortogram and lower extremity arteriogram with intervention.  Cellulitis with ulcer of right 2nd toe and possible toe amputation tomorrow.       Marty Heck, MD [facility] [place] Office: [place]

## 2022-11-08 ENCOUNTER — Encounter (HOSPITAL_COMMUNITY): Payer: Self-pay | Admitting: Vascular Surgery

## 2022-11-08 ENCOUNTER — Telehealth: Payer: Self-pay | Admitting: Vascular Surgery

## 2022-11-08 MED FILL — Lidocaine HCl Local Preservative Free (PF) Inj 1%: INTRAMUSCULAR | Qty: 30 | Status: AC

## 2022-11-08 NOTE — Telephone Encounter (Signed)
-----   Message from Cephus Shelling, MD sent at 11/07/2022  1:59 PM EDT ----- Patient name: JOHNSON NELLESSEN MRN: [id number]        DOB: May 22, 1961            Sex: male   11/07/2022 Pre-operative Diagnosis: Critical limb ischemia of the right lower extremity with second toe ulcer Post-operative diagnosis:  Same Surgeon:  Cephus Shelling, MD Procedure Performed: 1.  Ultrasound-guided access left common femoral artery 2.  CO2 aortogram 3.  Right lower extremity arteriogram with selection of third order branches 4.  Right TP trunk and posterior tibial artery angioplasty (3 mm x 150 mm Sterling) 5.  Mynx closure of the left common femoral artery 6.  57 minutes of monitored moderate conscious sedation time  #Can you arrange follow-up in 1 month with right leg arterial duplex and ABIs with me?  Thanks,  Thayer Ohm

## 2022-11-11 ENCOUNTER — Encounter (HOSPITAL_COMMUNITY)
Admission: RE | Admit: 2022-11-11 | Discharge: 2022-11-11 | Disposition: A | Discharge status: Home / Self Care | Payer: Medicare Other | Source: Ambulatory Visit | Attending: Nephrology | Admitting: Nephrology

## 2022-11-11 ENCOUNTER — Ambulatory Visit (HOSPITAL_COMMUNITY): Payer: Medicare Other

## 2022-11-11 ENCOUNTER — Encounter (HOSPITAL_COMMUNITY): Payer: Self-pay | Admitting: Vascular Surgery

## 2022-11-11 VITALS — BP 159/56 | HR 66 | Temp 98.0°F | Resp 12

## 2022-11-11 DIAGNOSIS — D500 Iron deficiency anemia secondary to blood loss (chronic): Secondary | ICD-10-CM

## 2022-11-11 DIAGNOSIS — N184 Chronic kidney disease, stage 4 (severe): Secondary | ICD-10-CM | POA: Diagnosis not present

## 2022-11-11 MED ORDER — DIPHENHYDRAMINE HCL 25 MG PO CAPS
25.0000 mg | ORAL_CAPSULE | Freq: Once | ORAL | Status: AC
  Administered 2022-11-11: 25 mg via ORAL
  Filled 2022-11-11: qty 1

## 2022-11-11 MED ORDER — SODIUM CHLORIDE 0.9 % IV SOLN
510.0000 mg | Freq: Once | INTRAVENOUS | Status: AC
  Administered 2022-11-11: 510 mg via INTRAVENOUS
  Filled 2022-11-11: qty 510

## 2022-11-11 MED ORDER — ACETAMINOPHEN 325 MG PO TABS
650.0000 mg | ORAL_TABLET | Freq: Once | ORAL | Status: AC
  Administered 2022-11-11: 650 mg via ORAL
  Filled 2022-11-11: qty 2

## 2022-11-11 NOTE — Progress Notes (Signed)
Diagnosis: Anemia in Chronic Kidney Disease  Provider:  Manpreet Bhutani MD  Procedure: Infusion  IV Type: Peripheral, IV Location: L Forearm  Feraheme (Ferumoxytol), Dose: 510 mg  Infusion Start Time: 0925  Infusion Stop Time: 0948  Post Infusion IV Care: Patient declined observation and Peripheral IV Discontinued  Discharge: Condition: Good, Destination: Skilled nursing facility . AVS Provided  Performed by:  Evelena Peat, RN

## 2022-11-18 ENCOUNTER — Encounter (HOSPITAL_COMMUNITY)
Admission: RE | Admit: 2022-11-18 | Discharge: 2022-11-18 | Disposition: A | Discharge status: Home / Self Care | Payer: Medicare Other | Source: Ambulatory Visit | Attending: Nephrology | Admitting: Nephrology

## 2022-11-18 ENCOUNTER — Telehealth: Payer: Self-pay | Admitting: Neurology

## 2022-11-18 VITALS — BP 152/61 | HR 72 | Temp 98.4°F | Resp 12

## 2022-11-18 DIAGNOSIS — N184 Chronic kidney disease, stage 4 (severe): Secondary | ICD-10-CM | POA: Diagnosis not present

## 2022-11-18 LAB — POCT HEMOGLOBIN-HEMACUE: Hemoglobin: 10.9 g/dL — ABNORMAL LOW (ref 13.0–17.0)

## 2022-11-18 MED ORDER — EPOETIN ALFA-EPBX 3000 UNIT/ML IJ SOLN: 3000.0000 [IU] | Freq: Once | INTRAMUSCULAR | Status: DC

## 2022-11-18 NOTE — Telephone Encounter (Signed)
I spoke with [name] at [facility] and left her a voicemail detailing patient's refusal. I have requested a return call for any further questions or concerns.

## 2022-11-18 NOTE — Telephone Encounter (Signed)
Oscar Rose @ [address]  is asking for a call re: where the scheduling of pt's sleep study and MRI stand, please call her at [phone]

## 2022-11-18 NOTE — Addendum Note (Signed)
Encounter addended by: Tera Mater, [facility] on: 11/18/2022 9:11 AM  Actions taken: Order list changed

## 2022-11-18 NOTE — Progress Notes (Signed)
Diagnosis: Anemia in Chronic Kidney Disease  Provider:  Manpreet Bhutani MD  Procedure: Injection  Retacrit (epoetin alfa-epbx), Dose: 3000 Units, Site: subcutaneous, Number of injections: Not Given  Hgb 10.9  Post Care: Patient declined observation  Discharge: Condition: Good, Destination: Skilled nursing facility . AVS Provided  Performed by:  Rastus Borton E, RN        

## 2022-11-18 NOTE — Telephone Encounter (Signed)
Appt has been scheduled.

## 2022-11-18 NOTE — Telephone Encounter (Signed)
At the office visit the patient refused the testing. We will call Oscar Rose back and let them know. If the patient wants to proceed we can obtain order from Dr Lucia Gaskins for MRI. For sleep study, patient would need referral. Would see if Dr Lucia Gaskins would place that or if patient would need to get this from primary care.

## 2022-11-25 ENCOUNTER — Encounter (HOSPITAL_COMMUNITY): Payer: Medicare Other

## 2022-11-26 ENCOUNTER — Other Ambulatory Visit: Payer: Self-pay | Admitting: *Deleted

## 2022-11-26 ENCOUNTER — Encounter (HOSPITAL_COMMUNITY): Payer: Self-pay | Admitting: Internal Medicine

## 2022-11-26 ENCOUNTER — Observation Stay (HOSPITAL_COMMUNITY)
Admit: 2022-11-26 | Discharge: 2022-11-26 | Disposition: A | Discharge status: Home / Self Care | Payer: Medicare Other | Attending: Internal Medicine | Admitting: Internal Medicine

## 2022-11-26 ENCOUNTER — Observation Stay (HOSPITAL_COMMUNITY): Payer: Medicare Other

## 2022-11-26 ENCOUNTER — Other Ambulatory Visit: Payer: Self-pay

## 2022-11-26 ENCOUNTER — Observation Stay (HOSPITAL_COMMUNITY)
Admission: EM | Admit: 2022-11-26 | Discharge: 2022-11-27 | Disposition: A | Discharge status: Skilled Nursing Facility | Payer: Medicare Other | Attending: Internal Medicine | Admitting: Internal Medicine

## 2022-11-26 ENCOUNTER — Emergency Department (HOSPITAL_COMMUNITY): Payer: Medicare Other

## 2022-11-26 DIAGNOSIS — I129 Hypertensive chronic kidney disease with stage 1 through stage 4 chronic kidney disease, or unspecified chronic kidney disease: Secondary | ICD-10-CM | POA: Insufficient documentation

## 2022-11-26 DIAGNOSIS — Z8673 Personal history of transient ischemic attack (TIA), and cerebral infarction without residual deficits: Secondary | ICD-10-CM | POA: Insufficient documentation

## 2022-11-26 DIAGNOSIS — R569 Unspecified convulsions: Secondary | ICD-10-CM

## 2022-11-26 DIAGNOSIS — G253 Myoclonus: Principal | ICD-10-CM | POA: Diagnosis present

## 2022-11-26 DIAGNOSIS — I10 Essential (primary) hypertension: Secondary | ICD-10-CM | POA: Diagnosis present

## 2022-11-26 DIAGNOSIS — N184 Chronic kidney disease, stage 4 (severe): Secondary | ICD-10-CM | POA: Diagnosis not present

## 2022-11-26 DIAGNOSIS — Z7901 Long term (current) use of anticoagulants: Secondary | ICD-10-CM | POA: Insufficient documentation

## 2022-11-26 DIAGNOSIS — Z79899 Other long term (current) drug therapy: Secondary | ICD-10-CM | POA: Diagnosis not present

## 2022-11-26 DIAGNOSIS — Z8679 Personal history of other diseases of the circulatory system: Secondary | ICD-10-CM | POA: Diagnosis not present

## 2022-11-26 DIAGNOSIS — E1122 Type 2 diabetes mellitus with diabetic chronic kidney disease: Secondary | ICD-10-CM | POA: Insufficient documentation

## 2022-11-26 DIAGNOSIS — Z7982 Long term (current) use of aspirin: Secondary | ICD-10-CM | POA: Diagnosis not present

## 2022-11-26 DIAGNOSIS — T367X5A Adverse effect of antifungal antibiotics, systemically used, initial encounter: Secondary | ICD-10-CM | POA: Diagnosis not present

## 2022-11-26 DIAGNOSIS — I70222 Atherosclerosis of native arteries of extremities with rest pain, left leg: Secondary | ICD-10-CM

## 2022-11-26 DIAGNOSIS — R339 Retention of urine, unspecified: Secondary | ICD-10-CM | POA: Insufficient documentation

## 2022-11-26 DIAGNOSIS — R112 Nausea with vomiting, unspecified: Secondary | ICD-10-CM | POA: Insufficient documentation

## 2022-11-26 DIAGNOSIS — R251 Tremor, unspecified: Secondary | ICD-10-CM | POA: Diagnosis present

## 2022-11-26 DIAGNOSIS — N189 Chronic kidney disease, unspecified: Secondary | ICD-10-CM

## 2022-11-26 DIAGNOSIS — R1110 Vomiting, unspecified: Secondary | ICD-10-CM | POA: Diagnosis not present

## 2022-11-26 DIAGNOSIS — F419 Anxiety disorder, unspecified: Secondary | ICD-10-CM | POA: Insufficient documentation

## 2022-11-26 DIAGNOSIS — E1140 Type 2 diabetes mellitus with diabetic neuropathy, unspecified: Secondary | ICD-10-CM | POA: Diagnosis not present

## 2022-11-26 DIAGNOSIS — I739 Peripheral vascular disease, unspecified: Secondary | ICD-10-CM

## 2022-11-26 DIAGNOSIS — M01X71 Direct infection of right ankle and foot in infectious and parasitic diseases classified elsewhere: Secondary | ICD-10-CM | POA: Insufficient documentation

## 2022-11-26 DIAGNOSIS — R2990 Unspecified symptoms and signs involving the nervous system: Secondary | ICD-10-CM | POA: Diagnosis not present

## 2022-11-26 DIAGNOSIS — T361X5A Adverse effect of cephalosporins and other beta-lactam antibiotics, initial encounter: Secondary | ICD-10-CM

## 2022-11-26 LAB — LACTIC ACID, PLASMA: Lactic Acid, Venous: 1.3 mmol/L (ref 0.5–1.9)

## 2022-11-26 LAB — COMPREHENSIVE METABOLIC PANEL
ALT: 34 U/L (ref 0–44)
AST: 25 U/L (ref 15–41)
Albumin: 3.9 g/dL (ref 3.5–5.0)
Alkaline Phosphatase: 107 U/L (ref 38–126)
Anion gap: 10 (ref 5–15)
BUN: 63 mg/dL — ABNORMAL HIGH (ref 6–20)
CO2: 24 mmol/L (ref 22–32)
Calcium: 9.2 mg/dL (ref 8.9–10.3)
Chloride: 104 mmol/L (ref 98–111)
Creatinine, Ser: 2.79 mg/dL — ABNORMAL HIGH (ref 0.61–1.24)
GFR, Estimated: 25 mL/min — ABNORMAL LOW (ref >60)
Glucose, Bld: 128 mg/dL — ABNORMAL HIGH (ref 70–99)
Potassium: 4.3 mmol/L (ref 3.5–5.1)
Sodium: 138 mmol/L (ref 135–145)
Total Bilirubin: 0.2 mg/dL — ABNORMAL LOW (ref 0.3–1.2)
Total Protein: 7.9 g/dL (ref 6.5–8.1)

## 2022-11-26 LAB — CBC WITH DIFFERENTIAL/PLATELET
Abs Immature Granulocytes: 0.02 10*3/uL (ref 0.00–0.07)
Basophils Absolute: 0 10*3/uL (ref 0.0–0.1)
Basophils Relative: 0 %
Eosinophils Absolute: 0.3 10*3/uL (ref 0.0–0.5)
Eosinophils Relative: 4 %
HCT: 32.4 % — ABNORMAL LOW (ref 39.0–52.0)
Hemoglobin: 10.4 g/dL — ABNORMAL LOW (ref 13.0–17.0)
Immature Granulocytes: 0 %
Lymphocytes Relative: 16 %
Lymphs Abs: 1.2 10*3/uL (ref 0.7–4.0)
MCH: 28.7 pg (ref 26.0–34.0)
MCHC: 32.1 g/dL (ref 30.0–36.0)
MCV: 89.3 fL (ref 80.0–100.0)
Monocytes Absolute: 0.4 10*3/uL (ref 0.1–1.0)
Monocytes Relative: 6 %
Neutro Abs: 5.2 10*3/uL (ref 1.7–7.7)
Neutrophils Relative %: 74 %
Platelets: 151 10*3/uL (ref 150–400)
RBC: 3.63 MIL/uL — ABNORMAL LOW (ref 4.22–5.81)
RDW: 14.8 % (ref 11.5–15.5)
WBC: 7.2 10*3/uL (ref 4.0–10.5)
nRBC: 0 % (ref 0.0–0.2)

## 2022-11-26 LAB — GLUCOSE, CAPILLARY
Glucose-Capillary: 90 mg/dL (ref 70–99)
Glucose-Capillary: 112 mg/dL — ABNORMAL HIGH (ref 70–99)

## 2022-11-26 LAB — URINALYSIS, W/ REFLEX TO CULTURE (INFECTION SUSPECTED)
Bacteria, UA: NONE SEEN
Bilirubin Urine: NEGATIVE
Glucose, UA: NEGATIVE mg/dL
Hgb urine dipstick: NEGATIVE
Ketones, ur: NEGATIVE mg/dL
Leukocytes,Ua: NEGATIVE
Nitrite: NEGATIVE
Protein, ur: 100 mg/dL — AB
Specific Gravity, Urine: 1.013 (ref 1.005–1.030)
pH: 5 (ref 5.0–8.0)

## 2022-11-26 MED ORDER — TAMSULOSIN HCL 0.4 MG PO CAPS: 0.4000 mg | ORAL_CAPSULE | Freq: Every day | ORAL | Status: DC

## 2022-11-26 MED ORDER — ONDANSETRON HCL 4 MG PO TABS: 4.0000 mg | ORAL_TABLET | Freq: Four times a day (QID) | ORAL | Status: DC | PRN

## 2022-11-26 MED ORDER — SULFAMETHOXAZOLE-TRIMETHOPRIM 800-160 MG PO TABS
1.0000 | ORAL_TABLET | Freq: Every day | ORAL | Status: DC
  Administered 2022-11-27: 1 via ORAL
  Filled 2022-11-26: qty 1

## 2022-11-26 MED ORDER — INSULIN ASPART 100 UNIT/ML IJ SOLN: 0.0000 [IU] | Freq: Every day | INTRAMUSCULAR | Status: DC

## 2022-11-26 MED ORDER — ADULT MULTIVITAMIN W/MINERALS CH
1.0000 | ORAL_TABLET | Freq: Every day | ORAL | Status: DC
  Administered 2022-11-27: 1 via ORAL
  Filled 2022-11-26: qty 1

## 2022-11-26 MED ORDER — HYDRALAZINE HCL 20 MG/ML IJ SOLN
10.0000 mg | Freq: Four times a day (QID) | INTRAMUSCULAR | Status: DC | PRN
  Administered 2022-11-26 – 2022-11-27 (×3): 10 mg via INTRAVENOUS
  Filled 2022-11-26 (×3): qty 1

## 2022-11-26 MED ORDER — FERROUS SULFATE 325 (65 FE) MG PO TABS
325.0000 mg | ORAL_TABLET | Freq: Every day | ORAL | Status: DC
  Administered 2022-11-27: 325 mg via ORAL
  Filled 2022-11-26: qty 1

## 2022-11-26 MED ORDER — SERTRALINE HCL 50 MG PO TABS
75.0000 mg | ORAL_TABLET | Freq: Every day | ORAL | Status: DC
  Administered 2022-11-27: 75 mg via ORAL
  Filled 2022-11-26: qty 2

## 2022-11-26 MED ORDER — HEPARIN SODIUM (PORCINE) 5000 UNIT/ML IJ SOLN
5000.0000 [IU] | Freq: Three times a day (TID) | INTRAMUSCULAR | Status: DC
  Administered 2022-11-26 – 2022-11-27 (×3): 5000 [IU] via SUBCUTANEOUS
  Filled 2022-11-26 (×3): qty 1

## 2022-11-26 MED ORDER — INSULIN ASPART 100 UNIT/ML IJ SOLN: 0.0000 [IU] | Freq: Three times a day (TID) | INTRAMUSCULAR | Status: DC

## 2022-11-26 MED ORDER — CLONIDINE HCL 0.2 MG/24HR TD PTWK: 0.3000 mg | MEDICATED_PATCH | TRANSDERMAL | Status: DC

## 2022-11-26 MED ORDER — ONDANSETRON HCL 4 MG/2ML IJ SOLN: 4.0000 mg | Freq: Four times a day (QID) | INTRAMUSCULAR | Status: DC | PRN

## 2022-11-26 MED ORDER — ATORVASTATIN CALCIUM 40 MG PO TABS: 40.0000 mg | ORAL_TABLET | Freq: Every day | ORAL | Status: DC

## 2022-11-26 MED ORDER — ONDANSETRON HCL 4 MG/2ML IJ SOLN
4.0000 mg | Freq: Once | INTRAMUSCULAR | Status: AC
  Administered 2022-11-26: 4 mg via INTRAVENOUS
  Filled 2022-11-26: qty 2

## 2022-11-26 MED ORDER — SODIUM CHLORIDE 0.9 % IV SOLN: INTRAVENOUS | Status: AC

## 2022-11-26 MED ORDER — ASPIRIN 81 MG PO TBEC
81.0000 mg | DELAYED_RELEASE_TABLET | Freq: Every day | ORAL | Status: DC
  Administered 2022-11-27: 81 mg via ORAL
  Filled 2022-11-26: qty 1

## 2022-11-26 MED ORDER — BUSPIRONE HCL 5 MG PO TABS
5.0000 mg | ORAL_TABLET | Freq: Two times a day (BID) | ORAL | Status: DC
  Administered 2022-11-27: 5 mg via ORAL
  Filled 2022-11-26: qty 1

## 2022-11-26 MED ORDER — SODIUM CHLORIDE 0.9 % IV SOLN
2.0000 g | INTRAVENOUS | Status: DC
  Administered 2022-11-26: 2 g via INTRAVENOUS
  Filled 2022-11-26: qty 20

## 2022-11-26 NOTE — ED Notes (Signed)
MD at bedside. 

## 2022-11-26 NOTE — ED Triage Notes (Signed)
Pt BIB [facility] from [facility] c/o muscle tremors

## 2022-11-26 NOTE — Consult Note (Signed)
I connected with  Oscar Rose on 11/26/22 by a video enabled telemedicine application and verified that I am speaking with the correct person using two identifiers.   I discussed the limitations of evaluation and management by telemedicine. The patient expressed understanding and agreed to proceed.  Location of patient: [facility] Location of physician: [facility]   Neurology Consultation Reason for Consult: Whole body tremor like movements Referring Physician: Dr Onalee Hua Tat  CC: Full body tremor-like movements  History is obtained from: Patient, chart review  HPI: Oscar Rose is a 62 y.o. male with past medical history of right MCA infarct in 2017 due to symptomatic high-grade right carotid stenosis, hypertension, hyperlipidemia, type 2 diabetes, obstructive sleep apnea, chronic kidney disease stage II, diabetic peripheral neuropathy, peripheral vascular disease with recent right second toe amputation who presented with whole body tremor like movements that started yesterday evening.  Patient is having significant tremor-like movements and unable to provide much history.  Therefore history obtained from chart review.  Patient recently had amputation of right second toe and was prescribed cefepime.  Yesterday evening he started having whole body tremor with movements and therefore was brought to emergency room this morning.  He continues to have twitching on his tongue, face, bilateral upper and lower extremities, more proximally.  Therefore neurology was consulted for further recommendations.  No prior history of seizures.  No recent illness.  ROS: All other systems reviewed and negative except as noted in the HPI.   Past Medical History:  Diagnosis Date   Anemia    ARDS (adult respiratory distress syndrome)    Arthritis    At risk for sleep apnea    STOP-BANG= 7    SENT TO PCP 06-29-2014   CKD (chronic kidney disease), stage II    montitored by nephrologist    Depression        Diabetic neuropathy    GERD (gastroesophageal reflux disease)    Headache    SINUS   History of kidney stones    History of retinal detachment    Hyperlipidemia    Hypertension    Legal blindness of left eye, 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  STROKE  JULY  2017   Tinea unguium    Type 2 diabetes mellitus    Vitamin D deficiency     Family History  Problem Relation Age of Onset   Emphysema Mother    Cancer Mother        Throat   Heart disease Mother    Stroke Neg Hx     Social History:  reports that he has never smoked. He has never used smokeless tobacco. He reports that he does not currently use alcohol. He reports that he does not use drugs.  Exam: Current vital signs: BP (!) 175/60   Pulse 92   Temp 98 F (36.7 C) (Rectal)   Resp 20   Ht  (1.803 m)   Wt 99.3 kg   SpO2 100%   BMI 30.53 kg/m  Vital signs in last 24 hours: Temp:  [97.8 F (36.6 C)-99.5 F (37.5 C)] 98 F (36.7 C) (04/23 0726)  Pulse Rate:  [76-92] 92 (04/23 1100) Resp:  [12-25] 20 (04/23 1100) BP: (98-180)/(60-90) 175/60 (04/23 1100) SpO2:  [92 %-100 %] 100 % (04/23 1100) Weight:  [99.3 kg] 99.3 kg (04/23 0243)   Physical Exam  Constitutional: Appears well-developed and well-nourished.  Psych: Affect appropriate to situation Eyes: No scleral injection Neuro: Awake, alert, oriented to self, follows commands, no apparent facial asymmetry, antigravity strength in right upper extremity, did appear to have antigravity strength in right lower extremity but would not hold leg up due to near constant twitching, similarly did appear to have some strength in left upper and left lower extremity but did not participate in further testing due  to significant twitching, had near constant semirhythmic twitching seen most prominently on face, tongue, bilateral shoulders but did involve both  upper and lower extremities, worse with movement  I have reviewed labs in epic and the results pertinent to this consultation are: CBC:  Recent Labs  Lab 11/26/22 0337  WBC 7.2  NEUTROABS 5.2  HGB 10.4*  HCT 32.4*  MCV 89.3  PLT 151    Basic Metabolic Panel:  Lab Results  Component Value Date   NA 138 11/26/2022   K 4.3 11/26/2022   CO2 24 11/26/2022   GLUCOSE 128 (H) 11/26/2022   BUN 63 (H) 11/26/2022   CREATININE 2.79 (H) 11/26/2022   CALCIUM 9.2 11/26/2022   GFRNONAA 25 (L) 11/26/2022   GFRAA >60 04/13/2019   Lipid Panel:  Lab Results  Component Value Date   LDLCALC 160 (H) 02/14/2016   HgbA1c:  Lab Results  Component Value Date   HGBA1C 5.5 05/13/2022   Urine Drug Screen:     Component Value Date/Time   LABOPIA NONE DETECTED 02/13/2016 2220   COCAINSCRNUR NONE DETECTED 02/13/2016 2220   LABBENZ NONE DETECTED 02/13/2016 2220   AMPHETMU NONE DETECTED 02/13/2016 2220   THCU NONE DETECTED 02/13/2016 2220   LABBARB NONE DETECTED 02/13/2016 2220    Alcohol Level No results found for: "ETH"   I have reviewed the images obtained:  CT Head without contrast 11/26/2022: No acute intracranial abnormality.  Chronic right frontoparietal and left posterior parietal infarcts.  ASSESSMENT/PLAN: 62 year old male presented with new onset whole body twitching in the setting of chronic kidney disease and cefepime use.  Cefepime induced myoclonus -Recommend holding cefepime and switching to different antibiotic -Will obtain EEG although low suspicion for seizures -Hold off on starting any antiseizure medications unless definite epileptiform abnormality on EEG -Can use Klonopin 0.25 mg and gradually increase to 0.5 mg or more if needed for symptomatic treatment. -This is anticipated to improve over the next 3 to 5 days.  However,  if renal function continues to worsen, may need nephrology consult for dialysis -If mental status worsens, will need to transfer to [facility] for long-term EEG monitoring to look for subclinical seizures -Discussed plan with Dr. Arbutus Leas  Thank you for allowing [place] to participate in the care of this patient. If you have any further questions, please contact  me or neurohospitalist.   Lindie Spruce [facility] neuro[facility]ist

## 2022-11-26 NOTE — Progress Notes (Signed)
EEG complete - results pending 

## 2022-11-26 NOTE — ED Provider Notes (Signed)
[place] EMERGENCY DEPARTMENT AT [facility]  Provider Note  CSN: [id number] Arrival date & time: 11/26/22 6578  History Chief Complaint  Patient presents with   Tremors    Oscar Rose is a 62 y.o. male with history of HTN, DM, HLD, and remote in 2017 currently living at [facility] was sent by EMS for evaluate of tremor, onset earlier this evening. Patient is awake and talking, denies any pain. No N/V/D. He recently had a toe amputation and is getting Abx through PICC line (maxipime) and oral bactrim from culture results after surgery. No reported fevers, has had some diaphoresis.     Home Medications Prior to Admission medications   Medication Sig Start Date End Date Taking? Authorizing Provider  Ascorbic Acid (VITAMIN C PO) Take 1 tablet by mouth 2 (two) times daily.    [provider]  aspirin EC 81 MG tablet Take 81 mg by mouth daily.    [provider]  atorvastatin (LIPITOR) 40 MG tablet Take 40 mg by mouth at bedtime.    [provider]  Baclofen 5 MG TABS Take 5 mg by mouth 3 (three) times daily.    [provider]  busPIRone (BUSPAR) 5 MG tablet Take 5 mg by mouth 2 (two) times daily.    [provider]  cloNIDine (CATAPRES - DOSED IN MG/24 HR) 0.3 mg/24hr patch Place 1 patch (0.3 mg total) onto the skin once a week. Patient taking differently: Place 0.3 mg onto the skin once a week. Every Sunday 07/21/22   Kendell Bane, MD  doxycycline (VIBRAMYCIN) 100 MG capsule Take 100 mg by mouth 2 (two) times daily. X 10 days 10/26/22   [provider]  ferrous sulfate 325 (65 FE) MG tablet Take 325 mg by mouth daily with breakfast.    [provider]  furosemide (LASIX) 40 MG tablet Take 1 tablet (40 mg total) by mouth daily. 07/17/22   Shahmehdi, Gemma Payor, MD  insulin glargine (LANTUS) 100 UNIT/ML injection Inject 0.2 mLs (20 Units total) into the skin at bedtime. 07/16/22   Shahmehdi, Gemma Payor, MD   insulin lispro (ADMELOG) 100 UNIT/ML injection Inject 3 Units into the skin 3 (three) times daily before meals. Hold for blood sugar <200    [provider]  labetalol (NORMODYNE) 200 MG tablet Take 1 tablet (200 mg total) by mouth 2 (two) times daily. 07/16/22 11/05/22  ShahmehdiGemma Payor, MD  meclizine (ANTIVERT) 25 MG tablet Take 25 mg by mouth every 8 (eight) hours.    [provider]  Multiple Vitamin (MULTIVITAMIN) tablet Take 1 tablet by mouth daily.    [provider]  ondansetron (ZOFRAN) 4 MG tablet Take 4 mg by mouth every 6 (six) hours as needed for nausea or vomiting.    [provider]  OXYGEN Inhale 4 L into the lungs as needed (for hyoxia).    [provider]  sertraline (ZOLOFT) 25 MG tablet Take 1 tablet (25 mg total) by mouth daily. Patient taking differently: Take 75 mg by mouth daily. 07/17/22 11/05/22  Shahmehdi, Gemma Payor, MD  tamsulosin (FLOMAX) 0.4 MG CAPS capsule Take 0.4 mg by mouth at bedtime.    [provider]     Allergies    Patient has no known allergies.   Review of Systems   Review of Systems Please see HPI for pertinent positives and negatives  Physical Exam BP 103/88   Pulse 84   Temp 99.5  F (37.5 C) (Rectal)   Resp 14   Ht  (1.803 m)   Wt 99.3 kg   SpO2 99%   BMI 30.53 kg/m   Physical Exam Vitals and nursing note reviewed.  Constitutional:      Appearance: Normal appearance. He is diaphoretic.  HENT:     Head: Normocephalic and atraumatic.     Nose: Nose normal.     Mouth/Throat:     Mouth: Mucous membranes are moist.  Eyes:     Extraocular Movements: Extraocular movements intact.     Conjunctiva/sclera: Conjunctivae normal.  Cardiovascular:     Rate and Rhythm: Normal rate.  Pulmonary:     Effort: Pulmonary effort is normal.     Breath sounds: Normal breath sounds.  Abdominal:     General: Abdomen is flat.     Palpations: Abdomen is soft.     Tenderness: There is no  abdominal tenderness.  Musculoskeletal:        General: No swelling. Normal range of motion.     Cervical back: Neck supple.     Comments: S/p amputation of all toes on left foot, R foot with recent 2nd toe amputations, mild erythema, no purulent drainage from wound. Sutures intact.   Skin:    General: Skin is warm.  Neurological:     Mental Status: He is alert.     Comments: Frequent, coarse spasm of arms, legs, and abdominal muscles. He is otherwise able to move all extremities without focal deficits.   Psychiatric:        Mood and Affect: Mood normal.     ED Results / Procedures / Treatments   EKG EKG Interpretation  Date/Time:  Tuesday November 26 2022 03:40:07 EDT Ventricular Rate:  79 PR Interval:  186 QRS Duration: 102 QT Interval:  434 QTC Calculation: 498 R Axis:   -33 Text Interpretation: Sinus tachycardia Multiple premature complexes, vent & supraven Probable left atrial enlargement Inferior infarct, old No significant change since last tracing Confirmed by Susy Frizzle [phone]) on 11/26/2022 3:55:39 AM  Procedures Procedures  Medications Ordered in the ED Medications  ondansetron [facility]) injection 4 mg (4 mg Intravenous Given 11/26/22 0333)    Initial Impression and Plan  Patient with tremors vs myoclonus. Does not appear to be focal seizure, he is awake and alert. Will check labs, CT and reassess.   ED Course   Clinical Course as of 11/26/22 0559  Tue Nov 26, 2022  6045 Patient more sweaty and now vomiting. Will check rectal temp, give Zofran. He continues to deny pain.  [CS]  0403 CBC with mild anemia, at baseline. His rectal temp was not elevated.  [CS]  0403 I personally viewed the images from radiology studies and agree with radiologist interpretation: CT shows old infarcts, no acute process.  [CS]  0408 CMP with CKD about at prior baseline. Lactic acid is normal.  [CS]  0457 Spoke with Dr. Iver Nestle, Neurology, who agrees this patient's symptoms are most  likely due to neurotoxicity from cefepime, especially in light of his poor renal function. She recommends admission to allow the cefepime to wash out and to find a different medication regimen to cover his osteomyelitis. Hospitalist paged.  [CS]  [phone] Spoke with Dr. Thomes Dinning, Hospitalist, who will evaluate for admission.  [CS]    Clinical Course User Index [CS] Pollyann Savoy, MD     MDM Rules/Calculators/A&P Medical Decision Making Problems Addressed: Adverse effect of cefixime: acute illness or injury Myoclonus: acute  illness or injury Neurotoxicity: acute illness or injury  Amount and/or Complexity of Data Reviewed Labs: ordered. Decision-making details documented in ED Course. Radiology: ordered and independent interpretation performed. Decision-making details documented in ED Course. ECG/medicine tests: ordered and independent interpretation performed. Decision-making details documented in ED Course.  Risk Prescription drug management. Decision regarding hospitalization.     Final Clinical Impression(s) / ED Diagnoses Final diagnoses:  Myoclonus  Neurotoxicity  Adverse effect of cefixime  Chronic kidney disease, unspecified CKD stage    Rx / DC Orders ED Discharge Orders     None        Pollyann Savoy, MD 11/26/22 [phone]

## 2022-11-26 NOTE — Hospital Course (Signed)
62 year old male with a history of stroke, OSA, hypertension, hyperlipidemia, diabetes mellitus type 2, peripheral arterial disease, and CKD stage IV presenting with nausea and vomiting and abnormal movements.  Apparently, the patient began having whole body shaking type movements lasting 1 to 5 seconds involving his mouth, face, abdomen, bilateral arms and legs.  The patient was lucid and able to answer questions during these episodes.  He states that he has never had episodes similar to this.  Notably, the patient recently was started on intravenous cefepime on 11/21/2022 and Bactrim DS for right foot wound.  It appears he had a PICC line placed on 11/20/2022 in the left upper extremity.  He denies any fevers, chills, chest pain, shortness breath, coughing, hemoptysis.  He has developed nausea and vomiting in the past 24 hours.  He denies any abdominal pain.  He denies any headache, visual disturbance, focal extremity weakness.  Notably, the patient recently underwent right TP trunk and right tibial artery angioplasty performed by Dr. Chestine Spore on 11/07/2022. In the ED, the patient had low-grade temperature 9 9.5 F.  He is hemodynamically stable.  Oxygen saturation is 95-96% on room air.  Patient was admitted for intractable nausea and vomiting as well as his abnormal movements.  His cefepime was discontinued and patient was started on IV ceftriaxone.  Over the next 24 hours, his myoclonus completely resolved.  As a result, he will continue on ceftriaxone after d/c for 9 additional days which will complete the 14 day course of abx that was initially prescribed by his wound care center.  His Bactrim DS was decreased to once daily due to his renal function/CKD4.  He will also finish 9 additional days as discussed above.

## 2022-11-26 NOTE — Progress Notes (Signed)
Seems alert and oriented and tries to state name when asked but unable to speak clearly.   Follows commands to squeeze hand and just asked for water.  Did swallow screen and choked on water.  Tried with straw and choked.  Continue to have mild to moderate, intermittent jerking of extremities, abdomen and tongue.  Sutures in  place to second right toe amputation site.  Speech order for swallow screen. Elevated bp treated

## 2022-11-26 NOTE — H&P (Signed)
History and Physical    [name]:[id number] DOB: 1961-06-13 DOA: 11/26/2022 DOS: the patient was seen and examined on 11/26/2022 PCP: Lindaann Pascal  Patient coming from: SNF  Chief Complaint:  Chief Complaint  Patient presents with   Tremors   HPI: Oscar Rose is a 62 year old male with a history of stroke, OSA, hypertension, hyperlipidemia, diabetes mellitus type 2, peripheral arterial disease, and CKD stage IV presenting with nausea and vomiting and abnormal movements.  Apparently, the patient began having whole body shaking type movements lasting 1 to 5 seconds involving his mouth, face, abdomen, bilateral arms and legs.  The patient was lucid and able to answer questions during these episodes.  He states that he has never had episodes similar to this.  Notably, the patient recently was started on intravenous cefepime on 11/21/2022 and Bactrim DS for right foot wound.  It appears he had a PICC line placed on 11/20/2022 in the left upper extremity.  He denies any fevers, chills, chest pain, shortness breath, coughing, hemoptysis.  He has developed nausea and vomiting in the past 24 hours.  He denies any abdominal pain.  He denies any headache, visual disturbance, focal extremity weakness.  Notably, the patient recently underwent right TP trunk and right tibial artery angioplasty performed by Dr. Chestine Spore on 11/07/2022. In the ED, the patient had low-grade temperature 9 9.5 F.  He is hemodynamically stable.  Oxygen saturation is 95-96% on room air.  Patient was admitted for intractable nausea and vomiting as well as his abnormal movements.  Review of Systems: As mentioned in the history of present illness. All other systems reviewed and are negative. 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  STROKE  JULY  2017   Tinea unguium    Type 2 diabetes mellitus    Vitamin D deficiency    Past Surgical History:  Procedure Laterality Date   ABDOMINAL AORTOGRAM W/LOWER EXTREMITY N/A 11/08/2021   Procedure: ABDOMINAL AORTOGRAM W/LOWER EXTREMITY;  Surgeon: Cephus Shelling, MD;  Location: [facility];  Service: Cardiovascular;  Laterality: N/A;   ABDOMINAL AORTOGRAM W/LOWER EXTREMITY Right 11/07/2022   Procedure: ABDOMINAL AORTOGRAM W/LOWER EXTREMITY;  Surgeon: Cephus Shelling, MD;  Location: [facility];  Service: Cardiovascular;  Laterality: Right;   AMPUTATION Bilateral 2012   Left big toe partial and right big toe complete   CARDIOVASCULAR STRESS TEST  04-05-2014  dr croitoru   low risk lexiscan nuclear study with mild diaphragmatic attenuation artifact/  no ischemia/  ef 58%   CATARACT EXTRACTION W/ INTRAOCULAR LENS  IMPLANT, BILATERAL  2013   CYSTO /  LEFT URETERAL STENT PLACEMENT  2012   CYSTOSCOPY W/ URETERAL STENT REMOVAL Left 07/07/2014   Procedure: CYSTO WITH LEFT PORTION STENT REMOVAL;  Surgeon: Anner Crete, MD;  Location: [facility];  Service: Urology;  Laterality: Left;   CYSTOSCOPY WITH URETEROSCOPY AND STENT PLACEMENT Left 07/07/2014   Procedure: CYSTOLITHALOPAXY URETEROSCOPY WITH STENT;  Surgeon: Anner Crete, MD;  Location: [facility];  Service: Urology;  Laterality: Left;   ENDARTERECTOMY Right  04/04/2016   Procedure: ENDARTERECTOMY CAROTID ARTERY RIGHT;  Surgeon: Chuck Hint, MD;  Location: [facility] OR;  Service: Vascular;  Laterality: Right;   HOLMIUM LASER APPLICATION N/A 07/07/2014   Procedure: HOLMIUM LASER APPLICATION;  Surgeon: Anner Crete, MD;  Location: [facility];  Service: Urology;  Laterality: N/A;   I & D  INFECTED SPIDER BITE UPPER BACK  06/ 2012   NEPHROLITHOTOMY Left 08/16/2014   Procedure: LEFT NEPHROLITHOTOMY PERCUTANEOUS;  Surgeon: Anner Crete, MD;  Location: [facility];  Service: Urology;  Laterality: Left;   NEPHROLITHOTOMY Left 08/18/2014   Procedure: LEFT NEPHROLITHOTOMY PERCUTANEOUS SECOND LOOK;  Surgeon: Anner Crete, MD;  Location: [facility];  Service: Urology;  Laterality: Left;   PATCH ANGIOPLASTY Right 04/04/2016   Procedure: PATCH ANGIOPLASTY RIGHT CAROTID ARTERY;  Surgeon: Chuck Hint, MD;  Location: [facility] OR;  Service: Vascular;  Laterality: Right;   PERIPHERAL VASCULAR BALLOON ANGIOPLASTY  11/08/2021   Procedure: PERIPHERAL VASCULAR BALLOON ANGIOPLASTY;  Surgeon: Cephus Shelling, MD;  Location: [facility];  Service: Cardiovascular;;  Left AT   PERIPHERAL VASCULAR BALLOON ANGIOPLASTY Right 11/07/2022   Procedure: PERIPHERAL VASCULAR BALLOON ANGIOPLASTY;  Surgeon: Cephus Shelling, MD;  Location: [facility];  Service: Cardiovascular;  Laterality: Right;  right TP trunk and PT   RETINAL DETACHMENT SURGERY Bilateral 2013   TONSILLECTOMY AND ADENOIDECTOMY  as child   Social History:  reports that he has never smoked. He has never used smokeless tobacco. He reports that he does not currently use alcohol. He reports that he does not use drugs.  No Known Allergies  Family History  Problem Relation Age of Onset   Emphysema Mother    Cancer Mother        Throat   Heart disease Mother    Stroke Neg Hx     Prior to Admission medications   Medication Sig Start Date End Date Taking? Authorizing Provider  Ascorbic Acid  (VITAMIN C PO) Take 1 tablet by mouth 2 (two) times daily.    [provider]  aspirin EC 81 MG tablet Take 81 mg by mouth daily.    [provider]  atorvastatin (LIPITOR) 40 MG tablet Take 40 mg by mouth at bedtime.    [provider]  Baclofen 5 MG TABS Take 5 mg by mouth 3 (three) times daily.    [provider]  busPIRone (BUSPAR) 5 MG tablet Take 5 mg by mouth 2 (two) times daily.    [provider]  cloNIDine (CATAPRES - DOSED IN MG/24 HR) 0.3 mg/24hr patch Place 1 patch (0.3 mg total) onto the skin once a week. Patient taking differently: Place 0.3 mg onto the skin once a week. Every Sunday 07/21/22   Kendell Bane, MD  doxycycline (VIBRAMYCIN) 100 MG capsule Take 100 mg by mouth 2 (two) times daily. X 10 days 10/26/22  [provider]  ferrous sulfate 325 (65 FE) MG tablet Take 325 mg by mouth daily with breakfast.    [provider]  furosemide (LASIX) 40 MG tablet Take 1 tablet (40 mg total) by mouth daily. 07/17/22   Shahmehdi, Gemma Payor, MD  insulin glargine (LANTUS) 100 UNIT/ML injection Inject 0.2 mLs (20 Units total) into the skin at bedtime. 07/16/22   Shahmehdi, Gemma Payor, MD  insulin lispro (ADMELOG) 100 UNIT/ML injection Inject 3 Units into the skin 3 (three) times daily before meals. Hold for blood sugar <200    [provider]  labetalol (NORMODYNE) 200 MG tablet Take 1 tablet (200 mg total) by mouth 2 (two) times daily. 07/16/22 11/05/22  ShahmehdiGemma Payor, MD  meclizine (ANTIVERT) 25 MG tablet Take 25 mg by mouth every 8 (eight) hours.    [provider]  Multiple Vitamin (MULTIVITAMIN) tablet Take 1 tablet by mouth daily.    [provider]  ondansetron (ZOFRAN) 4 MG tablet Take 4 mg by mouth every 6 (six) hours as needed for nausea or vomiting.    [provider]  OXYGEN Inhale 4 L into the lungs as needed (for hyoxia).    [provider]  sertraline (ZOLOFT) 25 MG  tablet Take 1 tablet (25 mg total) by mouth daily. Patient taking differently: Take 75 mg by mouth daily. 07/17/22 11/05/22  Shahmehdi, Gemma Payor, MD  tamsulosin (FLOMAX) 0.4 MG CAPS capsule Take 0.4 mg by mouth at bedtime.    [provider]    Physical Exam: Vitals:   11/26/22 0430 11/26/22 0548 11/26/22 0600 11/26/22 0726  BP: 98/61 103/88 (!) 145/83 (!) 180/90  Pulse: 80 84 81 83  Resp: 17 14  12   Temp:    98 F (36.7 C)  TempSrc:    Rectal  SpO2: 98% 99% 92% 100%  Weight:      Height:      GENERAL:  A&O x 2, NAD, well developed, cooperative, follows commands HEENT: [place]/AT, No thrush, No icterus, No oral ulcers Neck:  No neck mass, No meningismus, soft, supple CV: RRR, no S3, no S4, no rub, no JVD Lungs:  CTA, no wheeze, no rhonchi, good air movement Abd: soft/NT +BS, nondistended Ext: No edema, no lymphangitis, no cyanosis, no rashes Neuro:  CN II-XII intact, strength 4-/5 in RUE, RLE, strength 4-/5 LUE, LLE; sensation intact bilateral; no dysmetria; babinski equivocal  Data Reviewed: Data reviewed above in history  Assessment and Plan: Multifocal myoclonus/abnormal movement disorder -Consult neurology to rule out other etiologies of the patient's abnormal movement -Suspect this may be related to the patient's cefepime in the setting of CKD stage IV -Obtain EEG -Patient is alert and oriented x 2 and follows commands -Obtain MRI brain -Obtain UA and urine culture  Intractable nausea and vomiting -CT abdomen and pelvis -antiemetics -UA and urine culture -Clear liquid diet for now -judicious IVF  Diabetes mellitus type 2, controlled -05/13/2022 hemoglobin A1c 5.5 -NovoLog sliding scale  CKD stage IV -Baseline creatinine 2.4-2.7  Peripheral arterial disease -Status post right PTA and right tibial artery angioplasty 11/07/2022 -Continue antiplatelet agents including aspirin and Plavix  Anxiety/depression -Continue buspirone and sertraline  Urinary  retention -In-N-Out cath performed in the ED--1200 cc urine removed -Continue tamsulosin  Essential hypertension -Continue clonidine TTS 3 patch -Continue labetalol 200 mg twice daily.  ??  Right foot infection -No culture data available -Suspect this was empirically treated -Discontinue cefepime -Start ceftriaxone -Continue Bactrim DS  History  of stroke` -Continue aspirin and Lipitor  Mixed upper lipidemia Continue Lipitor   Advance Care Planning: FULL  Consults: neurology  Family Communication: none  Severity of Illness: The appropriate patient status for this patient is OBSERVATION. Observation status is judged to be reasonable and necessary in order to provide the required intensity of service to ensure the patient's safety. The patient's presenting symptoms, physical exam findings, and initial radiographic and laboratory data in the context of their medical condition is felt to place them at decreased risk for further clinical deterioration. Furthermore, it is anticipated that the patient will be medically stable for discharge from the hospital within 2 midnights of admission.   Author: Catarina Hartshorn, MD 11/26/2022 7:49 AM  For on call review [email or web address].

## 2022-11-26 NOTE — ED Notes (Signed)
CT notified this RN that pt was vomiting while at CT. Pt endorses nausea and dry heaves at this time. EDP made aware. See orders

## 2022-11-26 NOTE — ED Notes (Signed)
Patient transported to CT 

## 2022-11-26 NOTE — Procedures (Signed)
Patient Name: Oscar Rose  MRN: [id number]  Epilepsy Attending: Charlsie Quest  Referring Physician/Provider: Catarina Hartshorn, MD  Date: 11/26/2022 Duration: 25.47 mins  Patient history: 62 year old male presented with new onset whole body twitching in the setting of chronic kidney disease and cefepime use. EEG to evaluate for seizure  Level of alertness: Awake  AEDs during EEG study: None  Technical aspects: This EEG study was done with scalp electrodes positioned according to the 10-20 International system of electrode placement. Electrical activity was reviewed with band pass filter of 1-70Hz , sensitivity of 7 uV/mm, display speed of 73mm/sec with a  notched filter applied as appropriate. EEG data were recorded continuously and digitally stored.  Video monitoring was available and reviewed as appropriate.  Description: EEG showed continuous generalized 3 to 5 Hz theta-delta slowing, at times with triphasic morphology. .  Patient was noted to have episodes of brief whole body jerking every few seconds.  Concomitant EEG before, during and after the event  did not show any EEG changes suggest seizure.  Hyperventilation and photic stimulation were not performed.     ABNORMALITY - Continuous slow, generalized  IMPRESSION: This study is suggestive of moderate diffuse encephalopathy. No seizures or epileptiform discharges were seen throughout the recording.  Patient was noted to have episodes of brief whole body jerking every few seconds without concomitant EEG change. These events are non epileptic. Myoclonus can have similar appearance. Please correlate clinically.   Kristofer Schaffert Annabelle Harman

## 2022-11-27 DIAGNOSIS — R1110 Vomiting, unspecified: Secondary | ICD-10-CM | POA: Diagnosis not present

## 2022-11-27 DIAGNOSIS — N184 Chronic kidney disease, stage 4 (severe): Secondary | ICD-10-CM | POA: Diagnosis not present

## 2022-11-27 DIAGNOSIS — E1122 Type 2 diabetes mellitus with diabetic chronic kidney disease: Secondary | ICD-10-CM | POA: Diagnosis not present

## 2022-11-27 DIAGNOSIS — G253 Myoclonus: Secondary | ICD-10-CM | POA: Diagnosis not present

## 2022-11-27 LAB — HEMOGLOBIN A1C
Hgb A1c MFr Bld: 5.5 % (ref 4.8–5.6)
Mean Plasma Glucose: 111 mg/dL

## 2022-11-27 LAB — COMPREHENSIVE METABOLIC PANEL
ALT: 30 U/L (ref 0–44)
AST: 39 U/L (ref 15–41)
Albumin: 3.8 g/dL (ref 3.5–5.0)
Alkaline Phosphatase: 93 U/L (ref 38–126)
Anion gap: 8 (ref 5–15)
BUN: 60 mg/dL — ABNORMAL HIGH (ref 6–20)
CO2: 24 mmol/L (ref 22–32)
Calcium: 9.4 mg/dL (ref 8.9–10.3)
Chloride: 107 mmol/L (ref 98–111)
Creatinine, Ser: 2.78 mg/dL — ABNORMAL HIGH (ref 0.61–1.24)
GFR, Estimated: 25 mL/min — ABNORMAL LOW (ref >60)
Glucose, Bld: 113 mg/dL — ABNORMAL HIGH (ref 70–99)
Potassium: 4.4 mmol/L (ref 3.5–5.1)
Sodium: 139 mmol/L (ref 135–145)
Total Bilirubin: 0.7 mg/dL (ref 0.3–1.2)
Total Protein: 7.6 g/dL (ref 6.5–8.1)

## 2022-11-27 LAB — CBC
HCT: 32.2 % — ABNORMAL LOW (ref 39.0–52.0)
Hemoglobin: 10.3 g/dL — ABNORMAL LOW (ref 13.0–17.0)
MCH: 28.5 pg (ref 26.0–34.0)
MCHC: 32 g/dL (ref 30.0–36.0)
MCV: 89.2 fL (ref 80.0–100.0)
Platelets: 139 10*3/uL — ABNORMAL LOW (ref 150–400)
RBC: 3.61 MIL/uL — ABNORMAL LOW (ref 4.22–5.81)
RDW: 15 % (ref 11.5–15.5)
WBC: 5.7 10*3/uL (ref 4.0–10.5)
nRBC: 0 % (ref 0.0–0.2)

## 2022-11-27 LAB — GLUCOSE, CAPILLARY: Glucose-Capillary: 113 mg/dL — ABNORMAL HIGH (ref 70–99)

## 2022-11-27 MED ORDER — SULFAMETHOXAZOLE-TRIMETHOPRIM 800-160 MG PO TABS: 1.0000 | ORAL_TABLET | Freq: Every day | ORAL | Status: DC

## 2022-11-27 MED ORDER — TRAMADOL HCL 50 MG PO TABS: 50.0000 mg | ORAL_TABLET | Freq: Two times a day (BID) | ORAL | 0 refills | Status: DC | PRN

## 2022-11-27 MED ORDER — BACLOFEN 5 MG PO TABS: 5.0000 mg | ORAL_TABLET | Freq: Three times a day (TID) | ORAL | Status: AC | PRN

## 2022-11-27 MED ORDER — LABETALOL HCL 200 MG PO TABS
200.0000 mg | ORAL_TABLET | Freq: Two times a day (BID) | ORAL | Status: DC
  Administered 2022-11-27: 200 mg via ORAL
  Filled 2022-11-27: qty 1

## 2022-11-27 MED ORDER — CHLORHEXIDINE GLUCONATE CLOTH 2 % EX PADS
6.0000 | MEDICATED_PAD | Freq: Every day | CUTANEOUS | Status: DC
  Administered 2022-11-27: 6 via TOPICAL

## 2022-11-27 MED ORDER — SODIUM CHLORIDE 0.9 % IV SOLN: 2.0000 g | INTRAVENOUS | Status: DC

## 2022-11-27 NOTE — Progress Notes (Signed)
Alert and able to talk clearly this morning.  No jerking noted. Passed bedside swallow and able to take pills with no choking.  Drank all of clear liquid breakfast and ate two jellos.

## 2022-11-27 NOTE — [place]2 (Signed)
[place] MEDICAID [id number] LEVEL OF CARE FORM     IDENTIFICATION  Patient Name: Oscar Rose Birthdate: July 02, 1961 Sex: male Admission Date (Current Location): 11/26/2022  [place] and [place] Number:  Aaron Edelman [id number] Facility and Address:  [facility],  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   Intractable vomiting 11/26/2022   CKD stage 4 due to type 2 diabetes mellitus 11/26/2022   Iron deficiency anemia due to chronic blood loss 10/23/2022   Anemia due to stage 4 chronic kidney disease 10/23/2022   Influenza and pneumonia 07/01/2022   Acute respiratory failure with hypoxia 06/29/2022   Acute on chronic systolic CHF (congestive heart failure) 06/29/2022   Influenza A with pneumonia 06/29/2022   Elevated troponin 06/29/2022   ARDS (adult respiratory distress syndrome) 06/29/2022   HCAP (healthcare-associated pneumonia) 05/14/2022   AKI (acute kidney injury) superimposed on CKD 3B 05/14/2022   CKD (chronic kidney disease) stage 3B 05/14/2022   Acute hypoxemic respiratory failure due to bilateral multifocal pneumonia 05/12/2022   Acute hypoxic respiratory failure due to bilateral multifocal pneumonia 05/12/2022   BPPV (benign paroxysmal positional vertigo) 04/30/2017   Carotid aneurysm, 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    CKD (chronic kidney disease), stage II    Renal stone 08/16/2014   Precordial pain 03/23/2014   Impingement syndrome of shoulder 02/02/2013    Orientation RESPIRATION BLADDER Height & Weight     Self, Time, Situation, Place  Normal External catheter Weight: 217 lb 13 oz (98.8 kg) Height:   (180.3 cm)  BEHAVIORAL SYMPTOMS/MOOD NEUROLOGICAL BOWEL NUTRITION STATUS      Continent Diet (See d/c summary)  AMBULATORY STATUS COMMUNICATION OF NEEDS Skin   Extensive Assist Verbally Other (Comment) (Stage II to left buttocks with foam dressing)                       Personal Care Assistance Level of Assistance  Bathing, Feeding, Dressing Bathing Assistance: Maximum assistance Feeding assistance: Limited assistance Dressing Assistance: Maximum assistance     Functional Limitations Info  Sight, Hearing, Speech Sight Info: Adequate Hearing Info: Adequate Speech Info: Adequate    SPECIAL CARE FACTORS FREQUENCY                       Contractures      Additional Factors Info  Code Status, Allergies, Psychotropic, Isolation Precautions Code Status Info: Full code Allergies Info: No known allergies Psychotropic Info: Buspar, Zoloft         Current Medications (11/27/2022):  This is the current hospital active medication list Current Facility-Administered Medications  Medication Dose Route Frequency Provider Last Rate Last Admin   0.9 %  sodium  chloride infusion   Intravenous Continuous Tat, Onalee Hua, MD 50 mL/hr at 11/26/22 0854 New Bag at 11/26/22 0854   aspirin EC tablet 81 mg  81 mg Oral Daily Tat, Onalee Hua, MD       atorvastatin (LIPITOR) tablet 40 mg  40 mg Oral QHS Tat, Onalee Hua, MD       busPIRone (BUSPAR) tablet 5 mg  5 mg Oral BID Tat, Onalee Hua, MD       cefTRIAXone (ROCEPHIN) 2 g in sodium chloride 0.9 % 100 mL IVPB  2 g Intravenous Q24H Tat, Onalee Hua, MD 200 mL/hr at 11/26/22 1604 2 g at 11/26/22  1604   [START ON 12/01/2022] cloNIDine (CATAPRES - Dosed in mg/24 hr) patch 0.3 mg  0.3 mg Transdermal Weekly Tat, Onalee Hua, MD       ferrous sulfate tablet 325 mg  325 mg Oral Q breakfast Tat, Onalee Hua, MD       heparin injection 5,000 Units  5,000 Units Subcutaneous Andres Labrum, MD   5,000 Units at 11/27/22 0449   hydrALAZINE (APRESOLINE) injection 10 mg  10 mg Intravenous Q6H PRN Tat, Onalee Hua, MD   10 mg at 11/27/22 0449   insulin aspart (novoLOG) injection 0-5 Units  0-5 Units Subcutaneous QHS Tat, Onalee Hua, MD       insulin aspart (novoLOG) injection 0-9 Units  0-9 Units Subcutaneous TID WC Tat, Onalee Hua, MD       multivitamin with minerals tablet 1 tablet  1 tablet Oral Daily Tat, David, MD       ondansetron [facility]) tablet 4 mg  4 mg Oral Q6H PRN Tat, David, MD       Or   ondansetron (ZOFRAN) injection 4 mg  4 mg Intravenous Q6H PRN Tat, Onalee Hua, MD       sertraline (ZOLOFT) tablet 75 mg  75 mg Oral Daily Tat, David, MD       sulfamethoxazole-trimethoprim (BACTRIM DS) 800-160 MG per tablet 1 tablet  1 tablet Oral Daily Tat, David, MD       tamsulosin (FLOMAX) capsule 0.4 mg  0.4 mg Oral QHS Catarina Hartshorn, MD         Discharge Medications: Please see discharge summary for a list of discharge medications.  Relevant Imaging Results:  Relevant Lab Results:   Additional Information    Karn Cassis, LCSW

## 2022-11-27 NOTE — Progress Notes (Signed)
IV removed and report called to [place] at [facility].  Informed about elevated BP and receiving of prn apresoline as well as being cathed twice last night for retention, and last scan of 275.  Transported by WC to entrance for transport by [facility].  Hard script for tramadol in packet.  Discharged with PICC for abx long term.

## 2022-11-27 NOTE — TOC Transition Note (Signed)
Transition of Care [facility]) - CM/SW Discharge Note   Patient Details  Name: Oscar Rose MRN: [id number] Date of Birth: [birth year]  Transition of Care [facility] CM/SW Contact:  Karn Cassis, LCSW Phone Number: 11/27/2022, 11:53 AM   Clinical Narrative: Pt d/c today back to [facility]. Pt and facility aware and agreeable. LCSW asked about calling April and pt said not to. D/C summary sent to SNF. Discussed transportation and pt requests [facility]. Reviewed waiver and pt signed and LCSW placed on chart. RN given number to call report. [facility] arranged by [employer].      Final next level of care: Skilled Nursing Facility Barriers to Discharge: Barriers Resolved   Patient Goals and CMS Choice   Choice offered to / list presented to : Patient  Discharge Placement                  Patient to be transferred to facility by: [facility] Name of family member notified: pt only Patient and family notified of of transfer: 11/27/22  Discharge Plan and Services Additional resources added to the After Visit Summary for   In-house Referral: Clinical Social Work   Post Acute Care Choice: Skilled Nursing Facility                               Social Determinants of Health (SDOH) Interventions SDOH Screenings   Food Insecurity: No Food Insecurity (05/13/2022)  Housing: Low Risk  (05/13/2022)  Transportation Needs: No Transportation Needs (05/13/2022)  Utilities: Not At Risk (05/13/2022)  Tobacco Use: Low Risk  (11/26/2022)     Readmission Risk Interventions    07/16/2022   11:06 AM 06/30/2022   10:23 AM  Readmission Risk Prevention Plan  Transportation Screening Complete Complete  Medication Review [employer]) Complete Complete  PCP or Specialist appointment within 3-5 days of discharge Complete   HRI or Home Care Consult Complete Complete  SW Recovery Care/Counseling Consult Complete Complete  Palliative Care Screening Complete Not Applicable   Skilled Nursing Facility Complete Complete

## 2022-11-27 NOTE — Discharge Summary (Signed)
Physician Discharge Summary   [name] MRN: [id number] DOB: [birth year]  Admit date:     11/26/2022  Discharge date: 11/27/22  Discharge Physician: Onalee Hua Tyreck Bell   PCP: Lindaann Pascal   Recommendations at discharge:  BMP and CBC in one week after d/c Continue ceftriaxone 2 grams daily--last day 12/06/22 Continue Bactrim DS, once daily --last day 12/06/22    Hospital Course: 62 year old male with a history of stroke, OSA, hypertension, hyperlipidemia, diabetes mellitus type 2, peripheral arterial disease, and CKD stage IV presenting with nausea and vomiting and abnormal movements.  Apparently, the patient began having whole body shaking type movements lasting 1 to 5 seconds involving his mouth, face, abdomen, bilateral arms and legs.  The patient was lucid and able to answer questions during these episodes.  He states that he has never had episodes similar to this.  Notably, the patient recently was started on intravenous cefepime on 11/21/2022 and Bactrim DS for right foot wound.  It appears he had a PICC line placed on 11/20/2022 in the left upper extremity.  He denies any fevers, chills, chest pain, shortness breath, coughing, hemoptysis.  He has developed nausea and vomiting in the past 24 hours.  He denies any abdominal pain.  He denies any headache, visual disturbance, focal extremity weakness.  Notably, the patient recently underwent right TP trunk and right tibial artery angioplasty performed by Dr. Chestine Spore on 11/07/2022. In the ED, the patient had low-grade temperature 9 9.5 F.  He is hemodynamically stable.  Oxygen saturation is 95-96% on room air.  Patient was admitted for intractable nausea and vomiting as well as his abnormal movements.  His cefepime was discontinued and patient was started on IV ceftriaxone.  Over the next 24 hours, his myoclonus completely resolved.  As a result, he will continue on ceftriaxone after d/c for 9 additional days which will complete the 14 day course of  abx that was initially prescribed by his wound care center.  His Bactrim DS was decreased to once daily due to his renal function/CKD4.  He will also finish 9 additional days as discussed above.  Assessment and Plan: Multifocal myoclonus/abnormal movement disorder -Consult neurology to rule out other etiologies of the patient's abnormal movement -appreciate neurology>>myoclonus due to cefepime -Suspect this may be related to the patient's cefepime in the setting of CKD stage IV--pt was on 2 grams q 8 of cefepime -Obtain EEG--neg for epileptiform discharges -Patient is alert and oriented x 3 and follows commands on day of d/c -Obtain UA--no pyuria -11/27/22--myoclonus completely resolved   Intractable nausea and vomiting -CT abdomen and pelvis--neg for acute/concerning findings -antiemetics -UA--no pyuria -Clear liquid diet for now -judicious IVF -diet advanced and he tolerated it   Diabetes mellitus type 2, controlled -05/13/2022 hemoglobin A1c 5.5 -NovoLog sliding scale during the hospitalization   CKD stage IV -Baseline creatinine 2.4-2.7   Peripheral arterial disease -Status post right PTA and right tibial artery angioplasty 11/07/2022 -Continue antiplatelet agents including aspirin and Plavix   Anxiety/depression -Continue buspirone and sertraline   Urinary retention -In-N-Out cath performed in the ED--1200 cc urine removed -Continue tamsulosin   Essential hypertension -Continue clonidine TTS 3 patch -Continue labetalol 200 mg twice daily.   ??  Right foot infection -No culture data available -Suspect this was empirically treated -Discontinue cefepime -Start ceftriaxone 2 grams daily--last day 12/06/22 -Continue Bactrim DS once daily--last day 12/06/22 -stop date of 12/06/22 marks 14 days of abx as previously prescribed by his wound care center  History of stroke` -Continue aspirin and Lipitor   Mixed upper lipidemia Continue Lipitor     Pain control - [place] Controlled Substance Reporting System database was reviewed. and patient was instructed, not to drive, operate heavy machinery, perform activities at heights, swimming or participation in water activities or provide baby-sitting services while on Pain, Sleep and Anxiety Medications; until their outpatient Physician has advised to do so again. Also recommended to not to take more than prescribed Pain, Sleep and Anxiety Medications.  Consultants: neurology Procedures performed: none  Disposition: Skilled nursing facility Diet recommendation:  Carb modified diet DISCHARGE MEDICATION: Allergies as of 11/27/2022   No Known Allergies      Medication List     STOP taking these medications    ceFEPIme 2 g injection Commonly known as: MAXIPIME   doxycycline 100 MG capsule Commonly known as: VIBRAMYCIN       TAKE these medications    Admelog 100 UNIT/ML injection Generic drug: insulin lispro Inject 3 Units into the skin 3 (three) times daily before meals. Hold for blood sugar <200   aspirin EC 81 MG tablet Take 81 mg by mouth daily.   atorvastatin 40 MG tablet Commonly known as: LIPITOR Take 40 mg by mouth at bedtime.   Baclofen 5 MG Tabs Take 1 tablet (5 mg total) by mouth 3 (three) times daily as needed (muscle spasm). What changed:  when to take this reasons to take this   busPIRone 5 MG tablet Commonly known as: BUSPAR Take 5 mg by mouth 2 (two) times daily.   cefTRIAXone 2 g in sodium chloride 0.9 % 100 mL Inject 2 g into the vein daily. Last dose on 12/06/22   cloNIDine 0.3 mg/24hr patch Commonly known as: CATAPRES - Dosed in mg/24 hr Place 1 patch (0.3 mg total) onto the skin once a week. What changed: additional instructions   clopidogrel 75 MG tablet Commonly known as: PLAVIX Take 75 mg by mouth daily.   ferrous sulfate 325 (65 FE) MG tablet Take 325 mg by mouth daily with breakfast.   furosemide 40 MG tablet Commonly known as: LASIX Take 1  tablet (40 mg total) by mouth daily.   insulin glargine 100 UNIT/ML injection Commonly known as: LANTUS Inject 0.2 mLs (20 Units total) into the skin at bedtime.   labetalol 200 MG tablet Commonly known as: NORMODYNE Take 1 tablet (200 mg total) by mouth 2 (two) times daily.   meclizine 25 MG tablet Commonly known as: ANTIVERT Take 25 mg by mouth every 8 (eight) hours.   multivitamin tablet Take 1 tablet by mouth daily.   ondansetron 4 MG tablet Commonly known as: ZOFRAN Take 4 mg by mouth every 6 (six) hours as needed for nausea or vomiting.   OXYGEN Inhale 4 L into the lungs as needed (for hyoxia).   sertraline 25 MG tablet Commonly known as: ZOLOFT Take 1 tablet (25 mg total) by mouth daily. What changed: how much to take   sulfamethoxazole-trimethoprim 800-160 MG tablet Commonly known as: BACTRIM DS Take 1 tablet by mouth daily. X 9 days What changed:  when to take this additional instructions   tamsulosin 0.4 MG Caps capsule Commonly known as: FLOMAX Take 0.4 mg by mouth at bedtime.   traMADol 50 MG tablet Commonly known as: ULTRAM Take 1 tablet (50 mg total) by mouth 2 (two) times daily as needed for moderate pain.   VITAMIN C PO Take 1 tablet by mouth 2 (two) times daily.  Discharge Exam: Filed Weights   16-Dec-2022 0243 12-16-22 1452  Weight: 99.3 kg 98.8 kg   HEENT:  [place]/AT, No thrush, no icterus CV:  RRR, no rub, no S3, no S4 Lung:  CTA, no wheeze, no rhonchi Abd:  soft/+BS, NT Ext:  No edema, no lymphangitis, no synovitis, no rash;  Right and left foot without any open wound, erythema, drainage   Condition at discharge: stable  The results of significant diagnostics from this hospitalization (including imaging, microbiology, ancillary and laboratory) are listed below for reference.   Imaging Studies: EEG adult  Result Date: 12/16/22 Charlsie Quest, MD     12/16/22  5:48 PM Patient Name: Oscar Rose MRN: [id number] Epilepsy  Attending: Charlsie Quest Referring Physician/Provider: Catarina Hartshorn, MD Date: 12-16-2022 Duration: 25.47 mins Patient history: 62 year old male presented with new onset whole body twitching in the setting of chronic kidney disease and cefepime use. EEG to evaluate for seizure Level of alertness: Awake AEDs during EEG study: None Technical aspects: This EEG study was done with scalp electrodes positioned according to the 10-20 International system of electrode placement. Electrical activity was reviewed with band pass filter of 1-70Hz , sensitivity of 7 uV/mm, display speed of 71mm/sec with a  notched filter applied as appropriate. EEG data were recorded continuously and digitally stored.  Video monitoring was available and reviewed as appropriate. Description: EEG showed continuous generalized 3 to 5 Hz theta-delta slowing, at times with triphasic morphology. . Patient was noted to have episodes of brief whole body jerking every few seconds.  Concomitant EEG before, during and after the event  did not show any EEG changes suggest seizure. Hyperventilation and photic stimulation were not performed.   ABNORMALITY - Continuous slow, generalized IMPRESSION: This study is suggestive of moderate diffuse encephalopathy. No seizures or epileptiform discharges were seen throughout the recording. Patient was noted to have episodes of brief whole body jerking every few seconds without concomitant EEG change. These events are non epileptic. Myoclonus can have similar appearance. Please correlate clinically. Charlsie Quest   CT ABDOMEN PELVIS WO CONTRAST  Result Date: 16-Dec-2022 CLINICAL DATA:  Abdominal pain. EXAM: CT ABDOMEN AND PELVIS WITHOUT CONTRAST TECHNIQUE: Multidetector CT imaging of the abdomen and pelvis was performed following the standard protocol without IV contrast. RADIATION DOSE REDUCTION: This exam was performed according to the departmental dose-optimization program which includes automated exposure  control, adjustment of the mA and/or kV according to patient size and/or use of iterative reconstruction technique. COMPARISON:  05/31/2015 FINDINGS: Exam is degraded by motion artifact during image acquisition and limited by lack of intravenous contrast material. Lower chest: Unremarkable. Hepatobiliary: The liver shows diffusely decreased attenuation suggesting fat deposition. No suspicious focal abnormality in the liver on this study without intravenous contrast. Gallbladder is distended. No intrahepatic or extrahepatic biliary dilation. Pancreas: No focal mass lesion. No dilatation of the main duct. No intraparenchymal cyst. No peripancreatic edema. Spleen: No splenomegaly. No focal mass lesion. Adrenals/Urinary Tract: No adrenal nodule or mass. No hydronephrosis noted in either kidney. Left renal atrophy evident. No gross renal mass on this noncontrast study. No hydroureter. The urinary bladder appears normal for the degree of distention. Stomach/Bowel: Stomach is unremarkable. No gastric wall thickening. No evidence of outlet obstruction. Duodenum is normally positioned as is the ligament of Treitz. No small bowel wall thickening. No small bowel dilatation. The terminal ileum is normal. The appendix is normal. No gross colonic mass. No colonic wall thickening. Moderate stool volume throughout. Vascular/Lymphatic: There is  moderate atherosclerotic calcification of the abdominal aorta without aneurysm. There is no gastrohepatic or hepatoduodenal ligament lymphadenopathy. No retroperitoneal or mesenteric lymphadenopathy. No pelvic sidewall lymphadenopathy. Reproductive: The prostate gland and seminal vesicles are unremarkable. Other: No intraperitoneal free fluid. Musculoskeletal: Mild body wall edema noted lower abdomen and pelvis. No worrisome lytic or sclerotic osseous abnormality. IMPRESSION: 1. Motion degraded exam. 2. No acute findings in the abdomen or pelvis. Specifically, no findings to explain the  patient's history of abdominal pain. 3. Moderate stool volume. Imaging features could be compatible with clinical constipation. 4. Hepatic steatosis. 5. Left renal atrophy. 6.  Aortic Atherosclerosis (ICD10-I70.0). Electronically Signed   By: Kennith Center M.D.   On: 11/26/2022 09:37   CT Head Wo Contrast  Result Date: 11/26/2022 CLINICAL DATA:  Uncontrollable muscle tremors. EXAM: CT HEAD WITHOUT CONTRAST TECHNIQUE: Contiguous axial images were obtained from the base of the skull through the vertex without intravenous contrast. RADIATION DOSE REDUCTION: This exam was performed according to the departmental dose-optimization program which includes automated exposure control, adjustment of the mA and/or kV according to patient size and/or use of iterative reconstruction technique. COMPARISON:  April 13, 2019 FINDINGS: Brain: No evidence of acute infarction, hemorrhage, hydrocephalus, extra-axial collection or mass lesion/mass effect. Chronic right frontoparietal and left posterior parietal infarcts are seen. Small, chronic bilateral basal ganglia lacunar infarcts are also present. Vascular: No hyperdense vessel or unexpected calcification. Skull: Normal. Negative for fracture or focal lesion. Sinuses/Orbits: No acute finding. Other: None. IMPRESSION: 1. No acute intracranial abnormality. 2. Chronic right frontoparietal and left posterior parietal infarcts. Electronically Signed   By: Aram Candela M.D.   On: 11/26/2022 03:38   PERIPHERAL VASCULAR CATHETERIZATION  Result Date: 11/07/2022 Images from the original result were not included.   Patient name: Oscar Rose MRN: [id number]        DOB: Nov 06, 1960            Sex: male  11/07/2022 Pre-operative Diagnosis: Critical limb ischemia of the right lower extremity with second toe ulcer Post-operative diagnosis:  Same Surgeon:  Cephus Shelling, MD Procedure Performed: 1.  Ultrasound-guided access left common femoral artery 2.  CO2 aortogram 3.  Right  lower extremity arteriogram with selection of third order branches 4.  Right TP trunk and posterior tibial artery angioplasty (3 mm x 150 mm Sterling) 5.  Mynx closure of the left common femoral artery 6.  57 minutes of monitored moderate conscious sedation time  Indications: 62 year old male seen by Dr. Arbie Cookey in [place] with ulcer that is worsening on his right second toe.  He is able to stand and transfer even though he spends a lot of time in a wheelchair and is in a nursing facility.  He presents for lower extremity arteriogram with possible intervention with a focus on the right leg after risks benefits discussed.  Findings:  CO2 aortogram showed no flow-limiting stenosis in the aortoiliac segment.  Right lower extremity arteriogram with CO2 showed a patent common femoral, profunda, SFA, and above and below-knee popliteal artery.  He has significant tibial disease and all 3 of the tibial arteries are occluded proximally adjacent to the trunk.  Dominant flow into the foot is through posterior tibial that reconstitutes in the mid calf with filling of the plantar arch.  The anterior tibial occludes above the ankle.  Ultimately I was able to cross the TP trunk and get into the posterior tibial through antegrade approach.  This was angioplastied with a 3 mm Sterling with excellent  results.  Now has inline flow with widely patent vessel.             Procedure:  The patient was identified in the holding area and taken to room 8.  The patient was then placed supine on the table and prepped and draped in the usual sterile fashion.  A time out was called.  The patient received Versed and fentanyl for conscious moderate sedation.  Vital signs were monitored including heart rate, respiratory rate, oxygenation and blood pressure.  I was present for all of moderate sedation.  Ultrasound was used to evaluate the left common femoral artery.  It was patent .  A digital ultrasound image was acquired.  A micropuncture needle  was used to access the left common femoral artery under ultrasound guidance.  An 018 wire was advanced without resistance and a micropuncture sheath was placed.  The 018 wire was removed and a benson wire was placed.  The micropuncture sheath was exchanged for a 5 french sheath.  An omniflush catheter was advanced over the wire to the level of L-1.  An abdominal angiogram was obtained.  Next, using the omniflush catheter and a benson wire, the aortic bifurcation was crossed and the catheter was placed into the right external iliac artery and right leg runoff was obtained.  Ultimately elected for intervention.  We placed a Bentson wire down the right SFA and put a long 5 French Catapault sheath in the left groin over the aortic bifurcation.  Patient was given 100 units/kg IV heparin.  I then used a V18 with a 018 quick cross catheter to get down into the below-knee popliteal artery and cross into the TP trunk and down through the occluded proximal posterior tibial artery.  I confirmed with hand-injection I was in the true lumen distally.  This entire segment was treated with a 3 mm x 150 mm Sterling to nominal pressure for 2 minutes in each segment.  Widely patent vessel at completion with inline flow.  Wires and catheters were removed.  Short 5 French sheath placed in the left groin and a mynx closure device was deployed.   Plan: Excellent results after right TP trunk PT angioplasty.  Now has inline flow for toe amputation.  Will arrange follow-up in 1 month with noninvasive imaging.  Aspirin statin Plavix.  OK to proceed with toe amputation.  Cephus Shelling, MD [facility] Office: [phone]     Microbiology: Results for orders placed or performed during the hospital encounter of 06/29/22  Resp Panel by RT-PCR (Flu A&B, Covid) Anterior Nasal Swab     Status: Abnormal   Collection Time: 06/29/22  5:25 AM   Specimen: Anterior Nasal Swab  Result Value Ref Range Status    SARS Coronavirus 2 by RT PCR NEGATIVE NEGATIVE Final    Comment: (NOTE) SARS-CoV-2 target nucleic acids are NOT DETECTED.  The SARS-CoV-2 RNA is generally detectable in upper respiratory specimens during the acute phase of infection. The lowest concentration of SARS-CoV-2 viral copies this assay can detect is 138 copies/mL. A negative result does not preclude SARS-Cov-2 infection and should not be used as the sole basis for treatment or other patient management decisions. A negative result may occur with  improper specimen collection/handling, submission of specimen other than nasopharyngeal swab, presence of viral mutation(s) within the areas targeted by this assay, and inadequate number of viral copies(<138 copies/mL). A negative result must be combined with clinical observations, patient history, and epidemiological  information. The expected result is Negative.  Fact Sheet for Patients:  BloggerCourse.com  Fact Sheet for Healthcare Providers:  SeriousBroker.it  This test is no t yet approved or cleared by the [place] FDA and  has been authorized for detection and/or diagnosis of SARS-CoV-2 by FDA under an Emergency Use Authorization (EUA). This EUA will remain  in effect (meaning this test can be used) for the duration of the COVID-19 declaration under Section 564(b)(1) of the Act, 21 U.S.C.section 360bbb-3(b)(1), unless the authorization is terminated  or revoked sooner.       Influenza A by PCR POSITIVE (A) NEGATIVE Final   Influenza B by PCR NEGATIVE NEGATIVE Final    Comment: (NOTE) The Xpert Xpress SARS-CoV-2/FLU/RSV plus assay is intended as an aid in the diagnosis of influenza from Nasopharyngeal swab specimens and should not be used as a sole basis for treatment. Nasal washings and aspirates are unacceptable for Xpert Xpress SARS-CoV-2/FLU/RSV testing.  Fact Sheet for  Patients: BloggerCourse.com  Fact Sheet for Healthcare Providers: SeriousBroker.it  This test is not yet approved or cleared by the [place] FDA and has been authorized for detection and/or diagnosis of SARS-CoV-2 by FDA under an Emergency Use Authorization (EUA). This EUA will remain in effect (meaning this test can be used) for the duration of the COVID-19 declaration under Section 564(b)(1) of the Act, 21 U.S.C. section 360bbb-3(b)(1), unless the authorization is terminated or revoked.  Performed at [facility], [address], [place], [place] [address]   Culture, blood (routine x 2)     Status: Abnormal   Collection Time: 06/29/22  5:37 AM   Specimen: BLOOD RIGHT HAND  Result Value Ref Range Status   Specimen Description   Final    BLOOD RIGHT HAND Performed at [facility], [address], [place], [place] [address]    Special Requests   Final    BOTTLES DRAWN AEROBIC AND ANAEROBIC Blood Culture adequate volume Performed at [facility], [address], [place], [place] [address]    Culture  Setup Time   Final    ANAEROBIC BOTTLE ONLY GRAM POSITIVE RODS Gram Stain Report Called to,Read Back By and Verified With: ANGIE COE @ 1700 ON 07/02/22 Riesa Pope Performed at [facility], [address], [place], [place] [address]    Culture (A)  Final    PROPIONIBACTERIUM ACNES Standardized susceptibility testing for this organism is not available. Performed at [facility], [address] [place], [place] [address]    Report Status 07/04/2022 FINAL  Final  Culture, blood (routine x 2)     Status: Abnormal   Collection Time: 06/29/22  5:40 AM   Specimen: BLOOD LEFT HAND  Result Value Ref Range Status   Specimen Description   Final    BLOOD LEFT HAND BOTTLES DRAWN AEROBIC AND ANAEROBIC Performed at [facility], [address], [place], [place] [address]    Special Requests   Final    Blood Culture  adequate volume Performed at [facility], [address] [place], [place] [address]    Culture  Setup Time   Final    GRAM POSITIVE RODS ANAEROBIC BOTTLE ONLY CRITICAL VALUE NOTED.  VALUE IS CONSISTENT WITH PREVIOUSLY REPORTED AND CALLED VALUE.    Culture (A)  Final    PROPIONIBACTERIUM ACNES Standardized susceptibility testing for this organism is not available. Performed at [facility], [address], [place], [place] [address]    Report Status 07/05/2022  FINAL  Final  MRSA Next Gen by PCR, Nasal     Status: None   Collection Time: 06/29/22  9:39 AM   Specimen: Nasal Mucosa; Nasal Swab  Result Value Ref Range Status   MRSA by PCR Next Gen NOT DETECTED NOT DETECTED Final    Comment: (NOTE) The GeneXpert MRSA Assay (FDA approved for NASAL specimens only), is one component of a comprehensive MRSA colonization surveillance program. It is not intended to diagnose MRSA infection nor to guide or monitor treatment for MRSA infections. Test performance is not FDA approved in patients less than 51 years old. Performed at [facility], [address] [place], [place] [address]     Labs: CBC: Recent Labs  Lab 11/26/22 [phone] 11/27/22 0431  WBC 7.2 5.7  NEUTROABS 5.2  --   HGB 10.4* 10.3*  HCT 32.4* 32.2*  MCV 89.3 89.2  PLT 151 139*   Basic Metabolic Panel: Recent Labs  Lab 11/26/22 0337 11/27/22 0431  NA 138 139  K 4.3 4.4  CL 104 107  CO2 24 24  GLUCOSE 128* 113*  BUN 63* 60*  CREATININE 2.79* 2.78*  CALCIUM 9.2 9.4   Liver Function Tests: Recent Labs  Lab 11/26/22 0337 11/27/22 0431  AST 25 39  ALT 34 30  ALKPHOS 107 93  BILITOT 0.2* 0.7  PROT 7.9 7.6  ALBUMIN 3.9 3.8   CBG: Recent Labs  Lab 11/26/22 1619 11/26/22 2024 11/27/22 0759  GLUCAP 90 112* 113*    Discharge time spent: greater than 30 minutes.  Signed: Catarina Hartshorn, MD [facility] 11/27/2022

## 2022-11-27 NOTE — Progress Notes (Signed)
Held medications per MD's approval, [facility] informed this nurse that patient had chocked on water. Awaiting speech evaluation. Had to In and Out the patient twice this shift, once at 2357, getting then again at 0607 getting . Hydralazine given once this shift for BP 198/84. Continued to monitor, tremor continued this shift.

## 2022-11-27 NOTE — Care Management Obs Status (Signed)
MEDICARE OBSERVATION STATUS NOTIFICATION   Patient Details  Name: Oscar Rose MRN: [id number] Date of Birth: 1960/12/26   Medicare Observation Status Notification Given:  Yes    Corey Harold 11/27/2022, 11:53 AM

## 2022-11-27 NOTE — TOC Initial Note (Signed)
Transition of Care [facility]) - Initial/Assessment Note    Patient Details  Name: Oscar Rose MRN: [id number] Date of Birth: 08-22-1960  Transition of Care [facility]) CM/SW Contact:    Karn Cassis, LCSW Phone Number: 11/27/2022, 8:02 AM  Clinical Narrative: Pt admitted from [facility]. Pt reports he has been a resident at [facility] for about a year. Plan is to return at d/c. LCSW spoke with Herbert Seta at [facility] to confirm plan. TOC will continue to follow and update SNF on anticipated d/c.                   Expected Discharge Plan: Skilled Nursing Facility Barriers to Discharge: Continued Medical Work up   Patient Goals and CMS Choice Patient states their goals for this hospitalization and ongoing recovery are:: return to SNF   Choice offered to / list presented to : Patient [place] ownership interest in [facility].provided to::  (n/a)    Expected Discharge Plan and Services In-house Referral: Clinical Social Work   Post Acute Care Choice: Skilled Nursing Facility Living arrangements for the past 2 months: Skilled Nursing Facility                                      Prior Living Arrangements/Services Living arrangements for the past 2 months: Skilled Nursing Facility Lives with:: Facility Resident Patient language and need for interpreter reviewed:: Yes Do you feel safe going back to the place where you live?: Yes      Need for Family Participation in Patient Care: No (Comment)     Criminal Activity/Legal Involvement Pertinent to Current Situation/Hospitalization: No - Comment as needed  Activities of Daily Living      Permission Sought/Granted   Permission granted to share information with : Yes, Verbal Permission Granted     Permission granted to share info w AGENCY: [employer] granted to share info w Relationship: SNF     Emotional Assessment     Affect (typically observed): Appropriate Orientation:  : Oriented to Self, Oriented to Place, Oriented to  Time, Oriented to Situation Alcohol / Substance Use: Not Applicable Psych Involvement: No (comment)  Admission diagnosis:  Myoclonus [G25.3] Neurotoxicity [R29.90] 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] Adverse effect of cefixime [T36.1X5A] Chronic kidney disease, unspecified CKD stage [N18.9] Patient Active Problem List   Diagnosis Date Noted   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   Intractable vomiting 11/26/2022   CKD stage 4 due to type 2 diabetes mellitus 11/26/2022   Iron deficiency anemia due to chronic blood loss 10/23/2022   Anemia due to stage 4 chronic kidney disease 10/23/2022   Influenza and pneumonia 07/01/2022   Acute respiratory failure with hypoxia 06/29/2022   Acute on chronic systolic CHF (congestive heart failure) 06/29/2022   Influenza A with pneumonia 06/29/2022   Elevated troponin 06/29/2022   ARDS (adult respiratory distress syndrome) 06/29/2022   HCAP (healthcare-associated pneumonia) 05/14/2022   AKI (acute kidney injury) superimposed on CKD 3B 05/14/2022   CKD (chronic kidney disease) stage 3B 05/14/2022   Acute hypoxemic respiratory failure due to bilateral multifocal pneumonia 05/12/2022   Acute hypoxic respiratory failure due to bilateral multifocal pneumonia 05/12/2022   BPPV (benign paroxysmal positional vertigo) 04/30/2017   Carotid aneurysm, right 04/04/2016   Cerebral infarction due to unspecified mechanism    Essential hypertension    CVA (cerebral infarction) 02/13/2016   Dehydration 02/13/2016  Fall at home 02/13/2016   Hyponatremia 02/13/2016   Encephalopathy 02/13/2016   Hypertension    Type 2 diabetes mellitus    Legal blindness of left eye, as defined in [place]    CKD (chronic kidney disease), stage II    Renal stone 08/16/2014   Precordial pain 03/23/2014   Impingement syndrome of shoulder 02/02/2013   PCP:  Lindaann Pascal Pharmacy:   [id number] - [place], [place] - [address] [place] [address] Phone: [phone] Fax: [phone]  [facility] - [place], [place] - [address] [place] [address] Phone: [phone] Fax: [phone]     Social Determinants of Health (SDOH) Social History: SDOH Screenings   Food Insecurity: No Food Insecurity (05/13/2022)  Housing: Low Risk  (05/13/2022)  Transportation Needs: No Transportation Needs (05/13/2022)  Utilities: Not At Risk (05/13/2022)  Tobacco Use: Low Risk  (11/26/2022)   SDOH Interventions:     Readmission Risk Interventions    07/16/2022   11:06 AM 06/30/2022   10:23 AM  Readmission Risk Prevention Plan  Transportation Screening Complete Complete  Medication Review (RN Care Manager) Complete Complete  PCP or Specialist appointment within 3-5 days of discharge Complete   HRI or Home Care Consult Complete Complete  SW Recovery Care/Counseling Consult Complete Complete  Palliative Care Screening Complete Not Applicable  Skilled Nursing Facility Complete Complete

## 2022-11-28 ENCOUNTER — Telehealth: Payer: Self-pay

## 2022-11-28 NOTE — Telephone Encounter (Signed)
Called and spoke with Liborio Nixon at Dr. Lucio Edward office. Patient recently admitted with CBC drawn on 11/27/22 and Hgb 10.3. Liborio Nixon relayed results to Dr. Wolfgang Phoenix, who stated the patient did not need to keep his retacrit injection appointment on 12/02/22. Confirmed ok to check hemoglobin again at patient's next scheduled appointment on 12/16/22.  Called [place] [phone]) and spoke with Levander Campion LPN. Relayed that, per Dr. Wolfgang Phoenix, ok to cancel patient's retacrit injection appointment on 12/02/22. LPN verbalized understanding and stated he would communicate change to the scheduler. Confirmed that patient's next appointment is scheduled for 12/16/22.  Wyvonne Lenz, RN

## 2022-12-02 ENCOUNTER — Encounter (HOSPITAL_COMMUNITY): Admission: RE | Admit: 2022-12-02 | Payer: Medicare Other | Source: Ambulatory Visit

## 2022-12-05 ENCOUNTER — Ambulatory Visit (INDEPENDENT_AMBULATORY_CARE_PROVIDER_SITE_OTHER)
Admission: RE | Admit: 2022-12-05 | Discharge: 2022-12-05 | Disposition: A | Discharge status: Home / Self Care | Payer: Medicare Other | Source: Ambulatory Visit | Attending: Vascular Surgery | Admitting: Vascular Surgery

## 2022-12-05 ENCOUNTER — Ambulatory Visit (HOSPITAL_COMMUNITY)
Admission: RE | Admit: 2022-12-05 | Discharge: 2022-12-05 | Disposition: A | Discharge status: Home / Self Care | Payer: Medicare Other | Source: Ambulatory Visit | Attending: Vascular Surgery | Admitting: Vascular Surgery

## 2022-12-05 DIAGNOSIS — I739 Peripheral vascular disease, unspecified: Secondary | ICD-10-CM | POA: Diagnosis present

## 2022-12-06 LAB — VAS US ABI WITH/WO TBI
Left ABI: 1.05
Right ABI: 1.07

## 2022-12-10 ENCOUNTER — Ambulatory Visit (INDEPENDENT_AMBULATORY_CARE_PROVIDER_SITE_OTHER): Payer: Medicare Other | Admitting: Vascular Surgery

## 2022-12-10 ENCOUNTER — Encounter: Payer: Self-pay | Admitting: Vascular Surgery

## 2022-12-10 VITALS — BP 184/76 | HR 62 | Temp 97.3°F | Resp 16 | Ht 70.5 in | Wt 229.0 lb

## 2022-12-10 DIAGNOSIS — I70223 Atherosclerosis of native arteries of extremities with rest pain, bilateral legs: Secondary | ICD-10-CM

## 2022-12-10 NOTE — Progress Notes (Signed)
Patient name: Oscar Rose MRN: [id number] DOB: 12-07-1960 Sex: male  REASON FOR CONSULT: 1 month follow-up  HPI: Oscar Rose is a 62 y.o. male, with multiple risk factors including CKD, HTN, HLD, PVD, DM that presents for hospital follow-up after right leg intervention for critical limb ischemia with tissue loss.  On 11/07/2022 he underwent right TP trunk and PT angioplasty for second toe ulcer.  His tibial trifurcation was occluded and we were able to get the posterior tibial open which was his dominant runoff into the foot.  He has since undergone right second toe amputation that is healing.  He is taking aspirin statin Plavix.  No concerns.  Currently at [facility].  Past Medical History:  Diagnosis Date   Anemia    ARDS (adult respiratory distress syndrome) (HCC)    Arthritis    At risk for sleep apnea    STOP-BANG= 7    SENT TO PCP 06-29-2014   CKD (chronic kidney disease), stage II    montitored by nephrologist   Depression        Diabetic neuropathy (HCC)    GERD (gastroesophageal reflux disease)    Headache    SINUS   History of kidney stones    History of retinal detachment    Hyperlipidemia    Hypertension    Legal blindness of left eye, as defined in [place]    SECONDARY TO RETAINAL DETACHMENT   Neuropathy    PVD (peripheral vascular disease) (HCC)    Restless leg syndrome    Retained ureteral stent    w/ encrustation SINCE 2012   Rotator cuff syndrome of right shoulder    Sleep apnea in adult    Stroke [facility])    ?  STROKE  JULY  2017   Tinea unguium    Type 2 diabetes mellitus (HCC)    Vitamin D deficiency     Past Surgical History:  Procedure Laterality Date   ABDOMINAL AORTOGRAM W/LOWER EXTREMITY N/A 11/08/2021   Procedure: ABDOMINAL AORTOGRAM W/LOWER EXTREMITY;  Surgeon: Cephus Shelling, MD;  Location: [facility];  Service: Cardiovascular;  Laterality: N/A;   ABDOMINAL AORTOGRAM W/LOWER EXTREMITY Right 11/07/2022   Procedure: ABDOMINAL  AORTOGRAM W/LOWER EXTREMITY;  Surgeon: Cephus Shelling, MD;  Location: [facility];  Service: Cardiovascular;  Laterality: Right;   AMPUTATION Bilateral 2012   Left big toe partial and right big toe complete   CARDIOVASCULAR STRESS TEST  04-05-2014  dr croitoru   low risk lexiscan nuclear study with mild diaphragmatic attenuation artifact/  no ischemia/  ef 58%   CATARACT EXTRACTION W/ INTRAOCULAR LENS  IMPLANT, BILATERAL  2013   CYSTO /  LEFT URETERAL STENT PLACEMENT  2012   CYSTOSCOPY W/ URETERAL STENT REMOVAL Left 07/07/2014   Procedure: CYSTO WITH LEFT PORTION STENT REMOVAL;  Surgeon: Anner Crete, MD;  Location[facility];  Service: Urology;  Laterality: Left;   CYSTOSCOPY WITH URETEROSCOPY AND STENT PLACEMENT Left 07/07/2014   Procedure: CYSTOLITHALOPAXY URETEROSCOPY WITH STENT;  Surgeon: Anner Crete, MD;  Location: [facility];  Service: Urology;  Laterality: Left;   ENDARTERECTOMY Right 04/04/2016   Procedure: ENDARTERECTOMY CAROTID ARTERY RIGHT;  Surgeon: Chuck Hint, MD;  Location: [facility] OR;  Service: Vascular;  Laterality: Right;   HOLMIUM LASER APPLICATION N/A 07/07/2014   Procedure: HOLMIUM LASER APPLICATION;  Surgeon: Anner Crete, MD;  Location: [facility];  Service: Urology;  Laterality: N/A;  I & D  INFECTED SPIDER BITE UPPER BACK  06/ 2012   NEPHROLITHOTOMY Left 08/16/2014   Procedure: LEFT NEPHROLITHOTOMY PERCUTANEOUS;  Surgeon: Anner Crete, MD;  Location: [facility];  Service: Urology;  Laterality: Left;   NEPHROLITHOTOMY Left 08/18/2014   Procedure: LEFT NEPHROLITHOTOMY PERCUTANEOUS SECOND LOOK;  Surgeon: Anner Crete, MD;  Location: [facility];  Service: Urology;  Laterality: Left;   PATCH ANGIOPLASTY Right 04/04/2016   Procedure: PATCH ANGIOPLASTY RIGHT CAROTID ARTERY;  Surgeon: Chuck Hint, MD;  Location: [facility] OR;  Service: Vascular;  Laterality: Right;   PERIPHERAL VASCULAR BALLOON ANGIOPLASTY  11/08/2021    Procedure: PERIPHERAL VASCULAR BALLOON ANGIOPLASTY;  Surgeon: Cephus Shelling, MD;  Location: [facility];  Service: Cardiovascular;;  Left AT   PERIPHERAL VASCULAR BALLOON ANGIOPLASTY Right 11/07/2022   Procedure: PERIPHERAL VASCULAR BALLOON ANGIOPLASTY;  Surgeon: Cephus Shelling, MD;  Location: [facility];  Service: Cardiovascular;  Laterality: Right;  right TP trunk and PT   RETINAL DETACHMENT SURGERY Bilateral 2013   TONSILLECTOMY AND ADENOIDECTOMY  as child    Family History  Problem Relation Age of Onset   Emphysema Mother    Cancer Mother        Throat   Heart disease Mother    Stroke Neg Hx     SOCIAL HISTORY: Social History   Socioeconomic History   Marital status: Single    Spouse name: Not on file   Number of children: 0   Years of education: Not on file   Highest education level: Not on file  Occupational History   Not on file  Tobacco Use   Smoking status: Never   Smokeless tobacco: Never  Vaping Use   Vaping Use: Never used  Substance and Sexual Activity   Alcohol use: Not Currently   Drug use: No   Sexual activity: Not on file  Other Topics Concern   Not on file  Social History Narrative   Lives with "wife"      Update 11/05/2022   Lives at [facility] currently   Right handed   Caffeine: none    Social Determinants of Health   Financial Resource Strain: Not on file  Food Insecurity: No Food Insecurity (05/13/2022)   Hunger Vital Sign    Worried About Running Out of Food in the Last Year: Never true    Ran Out of Food in the Last Year: Never true  Transportation Needs: No Transportation Needs (05/13/2022)   PRAPARE - Administrator, Civil Service (Medical): No    Lack of Transportation (Non-Medical): No  Physical Activity: Not on file  Stress: Not on file  Social Connections: Not on file  Intimate Partner Violence: Not At Risk (05/13/2022)   Humiliation, Afraid, Rape, and Kick questionnaire    Fear of  Current or Ex-Partner: No    Emotionally Abused: No    Physically Abused: No    Sexually Abused: No    No Known Allergies  Current Outpatient Medications  Medication Sig Dispense Refill   Ascorbic Acid (VITAMIN C PO) Take 1 tablet by mouth 2 (two) times daily.     aspirin EC 81 MG tablet Take 81 mg by mouth daily.     atorvastatin (LIPITOR) 40 MG tablet Take 40 mg by mouth at bedtime.     Baclofen 5 MG TABS Take 1 tablet (5 mg total) by mouth 3 (three) times daily as needed (muscle spasm). 90 tablet  busPIRone (BUSPAR) 5 MG tablet Take 5 mg by mouth 2 (two) times daily.     cefTRIAXone 2 g in sodium chloride 0.9 % 100 mL Inject 2 g into the vein daily. Last dose on 12/06/22     cloNIDine (CATAPRES - DOSED IN MG/24 HR) 0.3 mg/24hr patch Place 1 patch (0.3 mg total) onto the skin once a week. (Patient taking differently: Place 0.3 mg onto the skin once a week. Every Sunday) 4 patch 12   clopidogrel (PLAVIX) 75 MG tablet Take 75 mg by mouth daily.     ferrous sulfate 325 (65 FE) MG tablet Take 325 mg by mouth daily with breakfast.     furosemide (LASIX) 40 MG tablet Take 1 tablet (40 mg total) by mouth daily. 30 tablet 1   insulin glargine (LANTUS) 100 UNIT/ML injection Inject 0.2 mLs (20 Units total) into the skin at bedtime. 10 mL 11   insulin lispro (ADMELOG) 100 UNIT/ML injection Inject 3 Units into the skin 3 (three) times daily before meals. Hold for blood sugar <200     meclizine (ANTIVERT) 25 MG tablet Take 25 mg by mouth every 8 (eight) hours.     Multiple Vitamin (MULTIVITAMIN) tablet Take 1 tablet by mouth daily.     ondansetron (ZOFRAN) 4 MG tablet Take 4 mg by mouth every 6 (six) hours as needed for nausea or vomiting.     OXYGEN Inhale 4 L into the lungs as needed (for hyoxia).     sulfamethoxazole-trimethoprim (BACTRIM DS) 800-160 MG tablet Take 1 tablet by mouth daily. X 9 days     tamsulosin (FLOMAX) 0.4 MG CAPS capsule Take 0.4 mg by mouth at bedtime.     traMADol (ULTRAM)  50 MG tablet Take 1 tablet (50 mg total) by mouth 2 (two) times daily as needed for moderate pain. 10 tablet 0   labetalol (NORMODYNE) 200 MG tablet Take 1 tablet (200 mg total) by mouth 2 (two) times daily. 60 tablet 0   sertraline (ZOLOFT) 25 MG tablet Take 1 tablet (25 mg total) by mouth daily. (Patient taking differently: Take 75 mg by mouth daily.) 30 tablet 0   No current facility-administered medications for this visit.    REVIEW OF SYSTEMS:  [X]  denotes positive finding, [ ]  denotes negative finding Cardiac  Comments:  Chest pain or chest pressure:    Shortness of breath upon exertion:    Short of breath when lying flat:    Irregular heart rhythm:        Vascular    Pain in calf, thigh, or hip brought on by ambulation:    Pain in feet at night that wakes you up from your sleep:     Blood clot in your veins:    Leg swelling:         Pulmonary    Oxygen at home:    Productive cough:     Wheezing:         Neurologic    Sudden weakness in arms or legs:     Sudden numbness in arms or legs:     Sudden onset of difficulty speaking or slurred speech:    Temporary loss of vision in one eye:     Problems with dizziness:         Gastrointestinal    Blood in stool:     Vomited blood:         Genitourinary    Burning when urinating:     Blood in  urine:        Psychiatric    Major depression:         Hematologic    Bleeding problems:    Problems with blood clotting too easily:        Skin    Rashes or ulcers:        Constitutional    Fever or chills:      PHYSICAL EXAM: Vitals:   12/10/22 0919  BP: (!) 184/76  Pulse: 62  Resp: 16  Temp: (!) 97.3 F (36.3 C)  TempSrc: Temporal  SpO2: 97%  Weight: 229 lb (103.9 kg)  Height: 5' 10.5" (1.791 m)    GENERAL: The patient is a well-nourished male, in no acute distress. The vital signs are documented above. CARDIAC: There is a regular rate and rhythm.  VASCULAR:  Right PT palpable Right second toe amputation  healing Left TMA previously healed    DATA:   ABIs on 12/05/2022 are 1.07 on the right biphasic and 1.05 on the left biphasic  Right leg arterial duplex shows widely patent PT and TP trunk intervention following angioplasty with no recurrent stenosis.  Assessment/Plan:  62 y.o. male, with multiple risk factors including CKD, HTN, HLD, PVD, DM that presents for hospital follow-up after right leg intervention for critical limb ischemia with tissue loss.  On 11/07/2022 he underwent right PT and TP trunk angioplasty for second toe ulcer.  His PT is palpable at the ankle on exam.  His recent intervention is patent on duplex through the posterior tibial.  Discussed to continue aspirin Plavix statin.  The right second toe amputation is healing.  I will see him in 6 months with right leg arterial duplex and ABIs.   Cephus Shelling, MD [facility] Office: [phone]

## 2022-12-16 ENCOUNTER — Encounter (HOSPITAL_COMMUNITY)
Admission: RE | Admit: 2022-12-16 | Discharge: 2022-12-16 | Disposition: A | Discharge status: Home / Self Care | Payer: Medicare Other | Source: Ambulatory Visit | Attending: Nephrology | Admitting: Nephrology

## 2022-12-16 VITALS — BP 144/62 | HR 63 | Temp 97.9°F | Resp 16

## 2022-12-16 DIAGNOSIS — N184 Chronic kidney disease, stage 4 (severe): Secondary | ICD-10-CM | POA: Diagnosis present

## 2022-12-16 DIAGNOSIS — D631 Anemia in chronic kidney disease: Secondary | ICD-10-CM

## 2022-12-16 LAB — POCT HEMOGLOBIN-HEMACUE: Hemoglobin: 9 g/dL — ABNORMAL LOW (ref 13.0–17.0)

## 2022-12-16 MED ORDER — EPOETIN ALFA-EPBX 4000 UNIT/ML IJ SOLN
3000.0000 [IU] | Freq: Once | INTRAMUSCULAR | Status: AC
  Administered 2022-12-16: 3000 [IU] via SUBCUTANEOUS

## 2022-12-16 NOTE — Progress Notes (Signed)
Diagnosis: Anemia in Chronic Kidney Disease  Provider:  Celso Amy MD  Procedure: Injection  Retacrit (epoetin alfa-epbx), Dose: 3000 Units, Site: subcutaneous, Number of injections: 1  Hgb 9.0. Administered in left arm.  Post Care: Observation period completed  Discharge: Condition: Stable, Destination: Skilled nursing facility . AVS Provided  Performed by:  Wyvonne Lenz, RN   Called and spoke with Massie Bougie at [facility]. Confirmed that patient also had a blood draw with a hemoglobin value of 9.2 on 12/04/22.

## 2022-12-16 NOTE — Addendum Note (Signed)
Encounter addended by: Kandra Nicolas, RN on: 12/16/2022 11:05 AM  Actions taken: Order list changed, Therapy plan modified

## 2022-12-16 NOTE — Addendum Note (Signed)
Encounter addended by: Wyvonne Lenz, RN on: 12/16/2022 10:54 AM  Actions taken: Therapy plan modified

## 2022-12-23 ENCOUNTER — Other Ambulatory Visit: Payer: Self-pay

## 2022-12-23 DIAGNOSIS — I739 Peripheral vascular disease, unspecified: Secondary | ICD-10-CM

## 2022-12-23 DIAGNOSIS — I70223 Atherosclerosis of native arteries of extremities with rest pain, bilateral legs: Secondary | ICD-10-CM

## 2022-12-31 ENCOUNTER — Telehealth (INDEPENDENT_AMBULATORY_CARE_PROVIDER_SITE_OTHER): Payer: Self-pay | Admitting: Gastroenterology

## 2022-12-31 ENCOUNTER — Ambulatory Visit (INDEPENDENT_AMBULATORY_CARE_PROVIDER_SITE_OTHER): Payer: Medicare Other | Admitting: Gastroenterology

## 2022-12-31 ENCOUNTER — Encounter (INDEPENDENT_AMBULATORY_CARE_PROVIDER_SITE_OTHER): Payer: Self-pay | Admitting: Gastroenterology

## 2022-12-31 ENCOUNTER — Encounter (HOSPITAL_COMMUNITY)
Admission: RE | Admit: 2022-12-31 | Discharge: 2022-12-31 | Disposition: A | Discharge status: Home / Self Care | Payer: Medicare Other | Source: Ambulatory Visit | Attending: Nephrology | Admitting: Nephrology

## 2022-12-31 VITALS — BP 174/83 | HR 72 | Temp 97.1°F | Ht 70.5 in | Wt 236.1 lb

## 2022-12-31 VITALS — BP 173/56 | HR 62 | Temp 97.7°F | Resp 18

## 2022-12-31 DIAGNOSIS — N184 Chronic kidney disease, stage 4 (severe): Secondary | ICD-10-CM | POA: Diagnosis not present

## 2022-12-31 DIAGNOSIS — D631 Anemia in chronic kidney disease: Secondary | ICD-10-CM

## 2022-12-31 DIAGNOSIS — D509 Iron deficiency anemia, unspecified: Secondary | ICD-10-CM

## 2022-12-31 LAB — POCT HEMOGLOBIN-HEMACUE: Hemoglobin: 10.6 g/dL — ABNORMAL LOW (ref 13.0–17.0)

## 2022-12-31 MED ORDER — PEG 3350-KCL-NA BICARB-NACL 420 G PO SOLR: 4000.0000 mL | Freq: Once | ORAL | 0 refills | Status: AC

## 2022-12-31 MED ORDER — EPOETIN ALFA-EPBX 3000 UNIT/ML IJ SOLN: 3000.0000 [IU] | Freq: Once | INTRAMUSCULAR | Status: DC

## 2022-12-31 NOTE — Patient Instructions (Addendum)
Continue with iron pills daily We will scheduled EGD and colonoscopy for further evaluation of your iron deficiency anemia   Follow up 3 months

## 2022-12-31 NOTE — Progress Notes (Signed)
Diagnosis: Anemia in Chronic Kidney Disease  Provider:  Celso Amy MD  Procedure: Injection  Retacrit (epoetin alfa-epbx), Dose: 3000 Units, Site: subcutaneous, Number of injections: 0  Hgb 10.6. Injection not administered.  Post Care: Observation period declined.  Discharge: Condition: Stable, Destination: Skilled nursing facility . AVS Provided  Performed by:  Wyvonne Lenz, RN

## 2022-12-31 NOTE — Addendum Note (Signed)
Addended by: Marlowe Shores on: 12/31/2022 10:20 AM   Modules accepted: Orders

## 2022-12-31 NOTE — Addendum Note (Signed)
Encounter addended by: Wyvonne Lenz, RN on: 12/31/2022 3:08 PM  Actions taken: Therapy plan modified

## 2022-12-31 NOTE — Telephone Encounter (Signed)
    12/31/22  Oscar Rose 08-06-1960  What type of surgery is being performed? EGD/Colonoscopy  When is surgery scheduled? 02/18/23  Clearance to hold Plavix for 5 days prior  Name of physician performing surgery?  Dr. Katrinka Blazing [facility] Phone: [phone] Fax: [phone]  Anethesia type (none, local, MAC, general)? MAC

## 2022-12-31 NOTE — Progress Notes (Signed)
Referring Provider: Joette Catching, MD Primary Care Physician:  Lindaann Pascal Primary GI Physician:  new   Chief Complaint  Patient presents with   New Patient (Initial Visit)    Patient here today for a new patient consult due to IDA, referred by Dr. Anette Guarneri. Patient denies any dark or bloody stools, fatigue, shortness of breath.Patient does have occasional dizziness,and chest pain. Patient is a resident of [facility]. Hgb today was 10.6. Patient had this drawn at [facility] today. Patient also has a history of CKD.   HPI:   Oscar Rose is a 62 y.o. male with past medical history of ARDS, CKD, depression, neuropathy, GERD, HLD, HTN, legal blindness of L eye, PVD, RLS, sleep apnea, stroke, Type 2 DM, recent TP trunk and PT angioplasty (April 2024)  Patient presenting today as a new patient for iron deficiency anemia   Appears to have had anemia since April 2023, hgb mosty ranging from 7-10 with last 10.6 this morning. Iron studies done in janaury 2024 with TIBC 194, Iron 21, iron sat 11, ferritin 736. Patient is receiving epogen q14 days and received two iron infusions in march. Taking PO iron daily.   Patient states that he has had iron deficiency for the past few months. Denies fatigue, sob or dizziness. No rectal bleeding or melena. No changes in bowel habits. He has a BM every 2-3 days, sometimes has to strain to go to the restroom. Denies taking anything for constipation. States appetite is good. He has no abdominal pain, nausea or vomiting. He had some weight loss after a hospital admission but has started to gain weight back. No NSAIDs.   NSAID use: tylenol only  Social hx: no etoh or tobacco  Fam hx: no CRC or liver disease.   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  STROKE  JULY  2017   Tinea unguium    Type 2 diabetes mellitus (HCC)    Vitamin D deficiency     Past Surgical History:  Procedure Laterality Date   ABDOMINAL AORTOGRAM W/LOWER EXTREMITY N/A 11/08/2021   Procedure: ABDOMINAL AORTOGRAM W/LOWER EXTREMITY;  Surgeon: Cephus Shelling, MD;  [facility];  Service: Cardiovascular;  Laterality: N/A;   ABDOMINAL AORTOGRAM W/LOWER EXTREMITY Right 11/07/2022   Procedure: ABDOMINAL AORTOGRAM W/LOWER EXTREMITY;  Surgeon: Cephus Shelling, MD;  Location: [facility];  Service: Cardiovascular;  Laterality: Right;  AMPUTATION Bilateral 2012   Left big toe partial and right big toe complete   CARDIOVASCULAR STRESS TEST  04-05-2014  dr croitoru   low risk lexiscan nuclear study with mild diaphragmatic attenuation artifact/  no ischemia/  ef 58%   CATARACT EXTRACTION W/ INTRAOCULAR LENS  IMPLANT, BILATERAL  2013   CYSTO /  LEFT URETERAL STENT PLACEMENT  2012   CYSTOSCOPY W/ URETERAL STENT REMOVAL Left 07/07/2014   Procedure: CYSTO WITH LEFT PORTION STENT REMOVAL;  Surgeon: Anner Crete, MD;  Location: [facility];  Service: Urology;  Laterality: Left;   CYSTOSCOPY WITH URETEROSCOPY AND STENT PLACEMENT Left 07/07/2014   Procedure: CYSTOLITHALOPAXY URETEROSCOPY WITH STENT;  Surgeon: Anner Crete, MD;  [facility];  Service: Urology;  Laterality: Left;   ENDARTERECTOMY Right 04/04/2016   Procedure: ENDARTERECTOMY CAROTID ARTERY RIGHT;  Surgeon: Chuck Hint, MD;  Location: [facility] OR;  Service: Vascular;  Laterality: Right;   HOLMIUM LASER APPLICATION N/A 07/07/2014   Procedure: HOLMIUM LASER APPLICATION;  Surgeon: Anner Crete, MD;  Location: [facility];  Service: Urology;  Laterality: N/A;   I & D  INFECTED SPIDER BITE UPPER BACK  06/ 2012   NEPHROLITHOTOMY Left 08/16/2014   Procedure: LEFT NEPHROLITHOTOMY PERCUTANEOUS;  Surgeon: Anner Crete, MD;  Location: [facility];  Service: Urology;  Laterality: Left;   NEPHROLITHOTOMY Left 08/18/2014   Procedure: LEFT NEPHROLITHOTOMY PERCUTANEOUS SECOND LOOK;  Surgeon: Anner Crete, MD;  Location: [facility];  Service: Urology;  Laterality: Left;   PATCH ANGIOPLASTY Right 04/04/2016   Procedure: PATCH ANGIOPLASTY RIGHT CAROTID ARTERY;  Surgeon: Chuck Hint, MD;  Location: [facility] OR;  Service: Vascular;  Laterality: Right;   PERIPHERAL VASCULAR BALLOON ANGIOPLASTY  11/08/2021   Procedure: PERIPHERAL VASCULAR BALLOON ANGIOPLASTY;  Surgeon: Cephus Shelling, MD;  [facility]: [facility];  Service: Cardiovascular;;  Left AT   PERIPHERAL VASCULAR BALLOON ANGIOPLASTY Right 11/07/2022   Procedure: PERIPHERAL VASCULAR BALLOON ANGIOPLASTY;  Surgeon: Cephus Shelling, MD;  [facility];  Service: Cardiovascular;  Laterality: Right;  right TP trunk and PT   RETINAL DETACHMENT SURGERY Bilateral 2013   TONSILLECTOMY AND ADENOIDECTOMY  as child    Current Outpatient Medications  Medication Sig Dispense Refill   Ascorbic Acid (VITAMIN C PO) Take 1 tablet by mouth 2 (two) times daily.     aspirin EC 81 MG tablet Take 81 mg by mouth daily.     atorvastatin (LIPITOR) 40 MG tablet Take 40 mg by mouth at bedtime.     Baclofen 5 MG TABS Take 1 tablet (5 mg total) by mouth 3 (three) times daily as needed (muscle  spasm). 90 tablet    busPIRone (BUSPAR) 5 MG tablet Take 5 mg by mouth 2 (two) times daily.     cloNIDine (CATAPRES - DOSED IN MG/24 HR) 0.3 mg/24hr patch Place 1 patch (0.3 mg total) onto the skin once a week. (Patient taking differently: Place 0.3 mg onto the skin once a week. Every Sunday) 4 patch 12   clopidogrel (PLAVIX) 75 MG tablet Take 75 mg by mouth daily.     ferrous sulfate 325 (65 FE) MG tablet Take 325 mg by mouth daily with breakfast.     furosemide (LASIX) 40 MG tablet Take 1 tablet (40 mg total) by mouth daily. 30 tablet 1   insulin glargine (LANTUS) 100 UNIT/ML injection Inject 0.2 mLs (20 Units total) into the skin at bedtime.  10 mL 11   insulin lispro (ADMELOG) 100 UNIT/ML injection Inject 3 Units into the skin 3 (three) times daily before meals. Hold for blood sugar <200     labetalol (NORMODYNE) 200 MG tablet Take 1 tablet (200 mg total) by mouth 2 (two) times daily. 60 tablet 0   loperamide (IMODIUM A-D) 2 MG tablet Take 2 mg by mouth 4 (four) times daily as needed for diarrhea or loose stools. 2 caplets initially, then one caplet following each loose stool.( Do not exceed 4 caplets in 24 hour period of time.     magnesium hydroxide (MILK OF MAGNESIA) 400 MG/5ML suspension Take 30 mLs by mouth daily as needed for mild constipation. Fleet enema x 1     meclizine (ANTIVERT) 25 MG tablet Take 25 mg by mouth every 8 (eight) hours.     Multiple Vitamin (MULTIVITAMIN) tablet Take 1 tablet by mouth daily.     ondansetron (ZOFRAN) 4 MG tablet Take 4 mg by mouth every 6 (six) hours as needed for nausea or vomiting.     sertraline (ZOLOFT) 25 MG tablet Take 1 tablet (25 mg total) by mouth daily. 30 tablet 0   tamsulosin (FLOMAX) 0.4 MG CAPS capsule Take 0.4 mg by mouth at bedtime.     traMADol (ULTRAM) 50 MG tablet Take 1 tablet (50 mg total) by mouth 2 (two) times daily as needed for moderate pain. 10 tablet 0   No current facility-administered medications for this visit.    Facility-Administered Medications Ordered in Other Visits  Medication Dose Route Frequency Provider Last Rate Last Admin   epoetin alfa-epbx (RETACRIT) injection 3,000 Units  3,000 Units Subcutaneous Once Randa Lynn, MD        Allergies as of 12/31/2022   (No Known Allergies)    Family History  Problem Relation Age of Onset   Emphysema Mother    Cancer Mother        Throat   Heart disease Mother    Stroke Neg Hx     Social History   Socioeconomic History   Marital status: Single    Spouse name: Not on file   Number of children: 0   Years of education: Not on file   Highest education level: Not on file  Occupational History   Not on file  Tobacco Use   Smoking status: Never   Smokeless tobacco: Never  Vaping Use   Vaping Use: Never used  Substance and Sexual Activity   Alcohol use: Not Currently   Drug use: No   Sexual activity: Not on file  Other Topics Concern   Not on file  Social History Narrative   Lives with "wife"      Update 11/05/2022   Lives at [facility] rehab currently   Right handed   Caffeine: none    Social Determinants of Health   Financial Resource Strain: Not on file  Food Insecurity: No Food Insecurity (05/13/2022)   Hunger Vital Sign    Worried About Running Out of Food in the Last Year: Never true    Ran Out of Food in the Last Year: Never true  Transportation Needs: No Transportation Needs (05/13/2022)   PRAPARE - Administrator, Civil Service (Medical): No    Lack of Transportation (Non-Medical): No  Physical Activity: Not on file  Stress: Not on file  Social Connections: Not on file   Review of systems General: negative for malaise, night sweats, fever, chills, weight loss Neck:  Negative for lumps, goiter, pain and significant neck swelling Resp: Negative for cough, wheezing, dyspnea at rest CV: Negative for chest pain, leg swelling, palpitations, orthopnea GI: denies melena, hematochezia, nausea,  vomiting, diarrhea, constipation, dysphagia, odyonophagia, early satiety or unintentional weight loss.  MSK: Negative for joint pain or swelling, back pain, and muscle pain. Derm: Negative for itching or rash Psych: Denies depression, anxiety, memory loss, confusion. No homicidal or suicidal ideation.  Heme: Negative for prolonged bleeding, bruising easily, and swollen nodes. Endocrine: Negative for cold or heat intolerance, polyuria, polydipsia and goiter. Neuro: negative for tremor, gait imbalance, syncope and seizures. The remainder of the review of systems is noncontributory.  Physical Exam: BP (!) 174/83 (BP Location: Left Arm, Patient Position: Sitting, Cuff Size: Large)   Pulse 72   Temp (!) 97.1 F (36.2 C) (Temporal)   Ht 5' 10.5" (1.791 m)   Wt 236 lb 1.6 oz (107.1 kg)   BMI 33.40 kg/m  General:   Alert and oriented. No distress noted. Pleasant and cooperative.  Head:  Normocephalic and atraumatic. Eyes:  Conjuctiva clear without scleral icterus. Mouth:  Oral mucosa pink and moist. Good dentition. No lesions. Heart: Normal rate and rhythm, s1 and s2 heart sounds present.  Lungs: Clear lung sounds in all lobes. Respirations equal and unlabored. Abdomen:  +BS, soft, non-tender and non-distended. No rebound or guarding. No HSM or masses noted. Derm: No palmar erythema or jaundice Msk:  Symmetrical without gross deformities. Normal posture. Extremities:  Without edema. Neurologic:  Alert and  oriented x4 Psych:  Alert and cooperative. Normal mood and affect.  Invalid input(s): "6 MONTHS"   ASSESSMENT: Oscar Rose is a 62 y.o. male presenting today as a new patient for IDA.  IDA for the past year requiring Iron infusions x2, receiving epogen q14 days and on PO iron daily. Required transfusion of blood in April due to hemoglobin decline to 7. He denies rectal bleeding or melena. No change in bowel habits or weight loss. No previous colonoscopy. On plavix for his vascular  issues and recent vascular surgery by Dr. Chestine Spore. Recommend proceeding with EGD/colonoscopy for further evaluation of his IDA as I cannot rule out source of blood loss in GI tract as etiology. Will need to obtain clearance from Dr. Chestine Spore prior to holding plavix for procedures given recent vascular procedure. Indications, risks and benefits of procedure discussed in detail with patient. Patient verbalized understanding and is in agreement to proceed with EGD/Colonoscopy.    PLAN:  Continue with PO iron daily  2.  Schedule EGD/Colonoscopy ASA III (on plavix, needs clearance with vascular prior to 5 day hold)   All questions were answered, patient verbalized understanding and is in agreement with plan as outlined above.    Follow Up: 3 months   Taylynn Easton L. Jeanmarie Hubert, MSN, APRN, AGNP-C Adult-Gerontology Nurse Practitioner [facility]

## 2023-01-08 ENCOUNTER — Telehealth (INDEPENDENT_AMBULATORY_CARE_PROVIDER_SITE_OTHER): Payer: Self-pay | Admitting: Gastroenterology

## 2023-01-08 NOTE — Telephone Encounter (Signed)
Oscar Rose from [facility] left voicemail in regards to if we have scheudled TCS/EGD for pt. Pt scheduled procedures while in office on 12/31/22. Clearance has been sent to [facility] in regards to Plavix being held for 5 days. Contacted Neal but had to leave voicemail for [place] to return call.

## 2023-01-08 NOTE — Telephone Encounter (Signed)
[name] from [facility] returned call. Informed her that pt had scheduled procedures. Gave pre op time also. Will fax instructions to her once clearance for Plavis is received. May fax instructions to [phone] Attn:[name] and she will give it to whomever needs it.

## 2023-01-13 ENCOUNTER — Encounter (HOSPITAL_COMMUNITY)
Admission: RE | Admit: 2023-01-13 | Discharge: 2023-01-13 | Disposition: A | Discharge status: Home / Self Care | Payer: Medicare Other | Source: Ambulatory Visit | Attending: Nephrology | Admitting: Nephrology

## 2023-01-13 VITALS — BP 163/62 | HR 64 | Temp 97.6°F | Resp 16

## 2023-01-13 DIAGNOSIS — N184 Chronic kidney disease, stage 4 (severe): Secondary | ICD-10-CM | POA: Insufficient documentation

## 2023-01-13 DIAGNOSIS — D631 Anemia in chronic kidney disease: Secondary | ICD-10-CM | POA: Diagnosis present

## 2023-01-13 LAB — POCT HEMOGLOBIN-HEMACUE: Hemoglobin: 12.3 g/dL — ABNORMAL LOW (ref 13.0–17.0)

## 2023-01-13 MED ORDER — EPOETIN ALFA-EPBX 3000 UNIT/ML IJ SOLN: 3000.0000 [IU] | Freq: Once | INTRAMUSCULAR | Status: DC

## 2023-01-13 NOTE — Addendum Note (Signed)
Encounter addended by: Tad Moore, [facility] on: 01/13/2023 9:05 AM  Actions taken: Order list changed

## 2023-01-13 NOTE — Progress Notes (Signed)
Diagnosis: Anemia in Chronic Kidney Disease  Provider:  Celso Amy MD  Procedure: Injection  Retacrit (epoetin alfa-epbx), Dose: 3000 Units, Site: subcutaneous, Number of injections: 0  Hgb 12.3. Injection not administered.  Post Care: Observation period declined.  Discharge: Condition: Stable, Destination: Skilled nursing facility . AVS Provided  Performed by:  Wyvonne Lenz, RN

## 2023-01-15 NOTE — Telephone Encounter (Signed)
Fax resent to [facility], Dr.Clark

## 2023-01-20 ENCOUNTER — Encounter (HOSPITAL_COMMUNITY): Payer: Self-pay | Admitting: Nephrology

## 2023-01-20 NOTE — Telephone Encounter (Signed)
Nurse returned call from [facility]. Refaxed clearance.

## 2023-01-20 NOTE — Telephone Encounter (Signed)
Left message at [facility] checking on clearance.

## 2023-01-22 NOTE — Telephone Encounter (Signed)
Re routed medication clearance telephone note to [facility].

## 2023-01-23 ENCOUNTER — Encounter (HOSPITAL_COMMUNITY): Payer: Self-pay | Admitting: Nephrology

## 2023-01-27 ENCOUNTER — Encounter (HOSPITAL_COMMUNITY)
Admission: RE | Admit: 2023-01-27 | Discharge: 2023-01-27 | Disposition: A | Discharge status: Home / Self Care | Payer: Medicare Other | Source: Ambulatory Visit | Attending: Nephrology | Admitting: Nephrology

## 2023-01-27 VITALS — BP 161/63 | HR 63 | Temp 97.8°F | Resp 16

## 2023-01-27 DIAGNOSIS — D631 Anemia in chronic kidney disease: Secondary | ICD-10-CM

## 2023-01-27 DIAGNOSIS — N184 Chronic kidney disease, stage 4 (severe): Secondary | ICD-10-CM | POA: Diagnosis not present

## 2023-01-27 LAB — POCT HEMOGLOBIN-HEMACUE: Hemoglobin: 10.3 g/dL — ABNORMAL LOW (ref 13.0–17.0)

## 2023-01-27 MED ORDER — EPOETIN ALFA-EPBX 3000 UNIT/ML IJ SOLN: 3000.0000 [IU] | Freq: Once | INTRAMUSCULAR | Status: DC

## 2023-01-27 NOTE — Addendum Note (Signed)
Encounter addended by: Wyvonne Lenz, RN on: 01/27/2023 11:17 AM  Actions taken: Therapy plan modified

## 2023-01-27 NOTE — Progress Notes (Signed)
Diagnosis: Anemia in Chronic Kidney Disease  Provider:  Celso Amy MD  Procedure: Injection  Retacrit (epoetin alfa-epbx), Dose: 3000 Units, Site: subcutaneous, Number of injections: 0  Hgb 10.3. Injection not administered.  Post Care: Observation period declined.  Discharge: Condition: Stable, Destination: Skilled nursing facility . AVS Provided  Performed by:  Wyvonne Lenz, RN

## 2023-01-28 NOTE — Telephone Encounter (Signed)
Refaxed clearance to [facility]

## 2023-01-31 NOTE — Telephone Encounter (Signed)
Left message with surgery scheduler and [facility] that clearance is needed and multiple request have been made

## 2023-02-10 ENCOUNTER — Encounter (HOSPITAL_COMMUNITY)
Admission: RE | Admit: 2023-02-10 | Discharge: 2023-02-10 | Disposition: A | Discharge status: Home / Self Care | Payer: Medicare Other | Source: Ambulatory Visit | Attending: Nephrology | Admitting: Nephrology

## 2023-02-10 VITALS — BP 133/60 | HR 63 | Temp 97.4°F | Resp 18

## 2023-02-10 DIAGNOSIS — D631 Anemia in chronic kidney disease: Secondary | ICD-10-CM | POA: Diagnosis present

## 2023-02-10 DIAGNOSIS — N184 Chronic kidney disease, stage 4 (severe): Secondary | ICD-10-CM | POA: Diagnosis present

## 2023-02-10 LAB — POCT HEMOGLOBIN-HEMACUE: Hemoglobin: 11.1 g/dL — ABNORMAL LOW (ref 13.0–17.0)

## 2023-02-10 MED ORDER — EPOETIN ALFA-EPBX 3000 UNIT/ML IJ SOLN: 3000.0000 [IU] | Freq: Once | INTRAMUSCULAR | Status: DC

## 2023-02-10 NOTE — Progress Notes (Signed)
Diagnosis: Anemia in Chronic Kidney Disease  Provider:  Celso Amy MD  Procedure: Injection  Retacrit (epoetin alfa-epbx),   Post Care:  Injection not given Hgb. 11.1  Discharge: Condition: Good, Destination: Skilled nursing facility . AVS Provided  Performed by:  Daleen Squibb, RN

## 2023-02-10 NOTE — Addendum Note (Signed)
Encounter addended by: Tad Moore, [facility] on: 02/10/2023 9:03 AM  Actions taken: Order list changed

## 2023-02-11 ENCOUNTER — Telehealth (INDEPENDENT_AMBULATORY_CARE_PROVIDER_SITE_OTHER): Payer: Self-pay | Admitting: Gastroenterology

## 2023-02-11 NOTE — Telephone Encounter (Signed)
Spoke with front desk at [facility]. Message sent to her triage nurse. Nurse will try to catch provider in between patients to get him to sign clearance to fax back.   Left message on [name]'s voicemail [facility]) that I will fax over instructions as soon as I receive clearance.

## 2023-02-11 NOTE — Telephone Encounter (Signed)
Oscar Rose 1961/05/12   What type of surgery is being performed? EGD/Colonoscopy   When is surgery scheduled? 02/18/23   Clearance to hold Plavix for 5 days prior   Name of physician performing surgery?  Dr. Katrinka Blazing [facility] Phone: [phone] Fax: [phone]   Anethesia type (none, local, MAC, general)? MAC

## 2023-02-12 ENCOUNTER — Encounter (INDEPENDENT_AMBULATORY_CARE_PROVIDER_SITE_OTHER): Payer: Self-pay

## 2023-02-12 ENCOUNTER — Telehealth (INDEPENDENT_AMBULATORY_CARE_PROVIDER_SITE_OTHER): Payer: Self-pay | Admitting: Gastroenterology

## 2023-02-12 NOTE — Telephone Encounter (Signed)
02/12/23   Oscar Rose [address] [place] [address]   Dear [name],  Your pre-op testing appointment is as follows:  Date: 02/14/23  Arrival Time: 9:00am  Place to Register: [facility] Main Entrance  Late arrivals for your scheduled appointment time will not be accepted and your procedure will be cancelled.  At this appointment you may have labs and an EKG done since you will be receiving anesthesia. This will be determined by the pre-op nurse.   Please contact the scheduler at [phone] as soon as possible if you need to reschedule your pre-op appointment for a more convenient time.  If you need to reschedule your procedure, then call the office at [phone].  Your wellness is our priority and we appreciate you choosing [facility] at [facility] as one of your healthcare providers.   Sincerely,    [facility] Nursing Staff

## 2023-02-12 NOTE — Telephone Encounter (Signed)
Clearance received. Ok to hold plavix for 5 days. Faxed to [facility]. Contacted [name] at [place] to let her know also.

## 2023-02-12 NOTE — Telephone Encounter (Signed)
Will send back to Marlowe [place], LPN, to let her know we do not manage pt.

## 2023-02-12 NOTE — Telephone Encounter (Signed)
Left message on clinical voicemail stating clearance is needed.

## 2023-02-12 NOTE — Patient Instructions (Signed)
Oscar Rose  02/12/2023     @PREFPERIOPPHARMACY @   Your procedure is scheduled on  02/18/2023.   Report to [facility] at  0915  A.M.   Call this number if you have problems the morning of surgery:  [phone]  If you experience any cold or flu symptoms such as cough, fever, chills, shortness of breath, etc. between now and your scheduled surgery, please notify us at the above number.   Remember:  Follow the diet and prep instructions given to you by the office.     Your last dose of plavix should have been on 02/12/2023.      Take 10 units of your night time insulin the night before your procedure.     DO NOT take any medications for diabetes the morning of your procedure.     Take these medicines the morning of surgery with A SIP OF WATER          busapr, baclofen (if needed), labetolol, antivert(if needed), zofran (if needed), zoloft, tramadol (if needed).     Do not wear jewelry, make-up or nail polish, including gel polish,  artificial nails, or any other type of covering on natural nails (fingers and  toes).  Do not wear lotions, powders, or perfumes, or deodorant.  Do not shave 48 hours prior to surgery.  Men may shave face and neck.  Do not bring valuables to the hospital.  [facility] is not responsible for any belongings or valuables.  Contacts, dentures or bridgework may not be worn into surgery.  Leave your suitcase in the car.  After surgery it may be brought to your room.  For patients admitted to the hospital, discharge time will be determined by your treatment team.  Patients discharged the day of surgery will not be allowed to drive home and must have someone with them for 24 hours.    Special instructions:   DO NOT smoke tobacco or vape for 24 hours before your procedure.  Please read over the following fact sheets that you were given. Surgical Site Infection Prevention and Anesthesia Post-op Instructions      Upper Endoscopy,  Adult, Care After After the procedure, it is common to have a sore throat. It is also common to have: Mild stomach pain or discomfort. Bloating. Nausea. Follow these instructions at home: The instructions below may help you care for yourself at home. Your health care provider may give you more instructions. If you have questions, ask your health care provider. If you were given a sedative during the procedure, it can affect you for several hours. Do not drive or operate machinery until your health care provider says that it is safe. If you will be going home right after the procedure, plan to have a responsible adult: Take you home from the hospital or clinic. You will not be allowed to drive. Care for you for the time you are told. Follow instructions from your health care provider about what you may eat and drink. Return to your normal activities as told by your health care provider. Ask your health care provider what activities are safe for you. Take over-the-counter and prescription medicines only as told by your health care provider. Contact a health care provider if you: Have a sore throat that lasts longer than one day. Have trouble swallowing. Have a fever. Get help right away if you: Vomit blood or your vomit looks like coffee grounds. Have bloody,  black, or tarry stools. Have a very bad sore throat or you cannot swallow. Have difficulty breathing or very bad pain in your chest or abdomen. These symptoms may be an emergency. Get help right away. Call 911. Do not wait to see if the symptoms will go away. Do not drive yourself to the hospital. Summary After the procedure, it is common to have a sore throat, mild stomach discomfort, bloating, and nausea. If you were given a sedative during the procedure, it can affect you for several hours. Do not drive until your health care provider says that it is safe. Follow instructions from your health care provider about what you may eat  and drink. Return to your normal activities as told by your health care provider. This information is not intended to replace advice given to you by your health care provider. Make sure you discuss any questions you have with your health care provider. Document Revised: 10/31/2021 Document Reviewed: 10/31/2021 Elsevier Patient Education  2024 Elsevier Inc. Colonoscopy, Adult, Care After The following information offers guidance on how to care for yourself after your procedure. Your health care provider may also give you more specific instructions. If you have problems or questions, contact your health care provider. What can I expect after the procedure? After the procedure, it is common to have: A small amount of blood in your stool for 24 hours after the procedure. Some gas. Mild cramping or bloating of your abdomen. Follow these instructions at home: Eating and drinking  Drink enough fluid to keep your urine pale yellow. Follow instructions from your health care provider about eating or drinking restrictions. Resume your normal diet as told by your health care provider. Avoid heavy or fried foods that are hard to digest. Activity Rest as told by your health care provider. Avoid sitting for a long time without moving. Get up to take short walks every 1-2 hours. This is important to improve blood flow and breathing. Ask for help if you feel weak or unsteady. Return to your normal activities as told by your health care provider. Ask your health care provider what activities are safe for you. Managing cramping and bloating  Try walking around when you have cramps or feel bloated. If directed, apply heat to your abdomen as told by your health care provider. Use the heat source that your health care provider recommends, such as a moist heat pack or a heating pad. Place a towel between your skin and the heat source. Leave the heat on for 20-30 minutes. Remove the heat if your skin turns  bright red. This is especially important if you are unable to feel pain, heat, or cold. You have a greater risk of getting burned. General instructions If you were given a sedative during the procedure, it can affect you for several hours. Do not drive or operate machinery until your health care provider says that it is safe. For the first 24 hours after the procedure: Do not sign important documents. Do not drink alcohol. Do your regular daily activities at a slower pace than normal. Eat soft foods that are easy to digest. Take over-the-counter and prescription medicines only as told by your health care provider. Keep all follow-up visits. This is important. Contact a health care provider if: You have blood in your stool 2-3 days after the procedure. Get help right away if: You have more than a small spotting of blood in your stool. You have large blood clots in your stool. You have  swelling of your abdomen. You have nausea or vomiting. You have a fever. You have increasing pain in your abdomen that is not relieved with medicine. These symptoms may be an emergency. Get help right away. Call 911. Do not wait to see if the symptoms will go away. Do not drive yourself to the hospital. Summary After the procedure, it is common to have a small amount of blood in your stool. You may also have mild cramping and bloating of your abdomen. If you were given a sedative during the procedure, it can affect you for several hours. Do not drive or operate machinery until your health care provider says that it is safe. Get help right away if you have a lot of blood in your stool, nausea or vomiting, a fever, or increased pain in your abdomen. This information is not intended to replace advice given to you by your health care provider. Make sure you discuss any questions you have with your health care provider. Document Revised: 09/03/2022 Document Reviewed: 03/14/2021 Elsevier Patient Education  2024  Elsevier Inc. Monitored Anesthesia Care, Care After The following information offers guidance on how to care for yourself after your procedure. Your health care provider may also give you more specific instructions. If you have problems or questions, contact your health care provider. What can I expect after the procedure? After the procedure, it is common to have: Tiredness. Little or no memory about what happened during or after the procedure. Impaired judgment when it comes to making decisions. Nausea or vomiting. Some trouble with balance. Follow these instructions at home: For the time period you were told by your health care provider:  Rest. Do not participate in activities where you could fall or become injured. Do not drive or use machinery. Do not drink alcohol. Do not take sleeping pills or medicines that cause drowsiness. Do not make important decisions or sign legal documents. Do not take care of children on your own. Medicines Take over-the-counter and prescription medicines only as told by your health care provider. If you were prescribed antibiotics, take them as told by your health care provider. Do not stop using the antibiotic even if you start to feel better. Eating and drinking Follow instructions from your health care provider about what you may eat and drink. Drink enough fluid to keep your urine pale yellow. If you vomit: Drink clear fluids slowly and in small amounts as you are able. Clear fluids include water, ice chips, low-calorie sports drinks, and fruit juice that has water added to it (diluted fruit juice). Eat light and bland foods in small amounts as you are able. These foods include bananas, applesauce, rice, lean meats, toast, and crackers. General instructions  Have a responsible adult stay with you for the time you are told. It is important to have someone help care for you until you are awake and alert. If you have sleep apnea, surgery and some  medicines can increase your risk for breathing problems. Follow instructions from your health care provider about wearing your sleep device: When you are sleeping. This includes during daytime naps. While taking prescription pain medicines, sleeping medicines, or medicines that make you drowsy. Do not use any products that contain nicotine or tobacco. These products include cigarettes, chewing tobacco, and vaping devices, such as e-cigarettes. If you need help quitting, ask your health care provider. Contact a health care provider if: You feel nauseous or vomit every time you eat or drink. You feel light-headed. You are  still sleepy or having trouble with balance after 24 hours. You get a rash. You have a fever. You have redness or swelling around the IV site. Get help right away if: You have trouble breathing. You have new confusion after you get home. These symptoms may be an emergency. Get help right away. Call 911. Do not wait to see if the symptoms will go away. Do not drive yourself to the hospital. This information is not intended to replace advice given to you by your health care provider. Make sure you discuss any questions you have with your health care provider. Document Revised: 12/17/2021 Document Reviewed: 12/17/2021 Elsevier Patient Education  2024 [employer].

## 2023-02-14 ENCOUNTER — Encounter (HOSPITAL_COMMUNITY)
Admission: RE | Admit: 2023-02-14 | Discharge: 2023-02-14 | Disposition: A | Discharge status: Home / Self Care | Payer: Medicare Other | Source: Ambulatory Visit | Attending: Gastroenterology | Admitting: Gastroenterology

## 2023-02-14 ENCOUNTER — Telehealth (INDEPENDENT_AMBULATORY_CARE_PROVIDER_SITE_OTHER): Payer: Self-pay | Admitting: Gastroenterology

## 2023-02-14 VITALS — BP 133/60 | HR 63 | Temp 97.4°F | Resp 18 | Ht 70.5 in | Wt 236.1 lb

## 2023-02-14 DIAGNOSIS — N184 Chronic kidney disease, stage 4 (severe): Secondary | ICD-10-CM | POA: Diagnosis not present

## 2023-02-14 DIAGNOSIS — E1122 Type 2 diabetes mellitus with diabetic chronic kidney disease: Secondary | ICD-10-CM | POA: Insufficient documentation

## 2023-02-14 DIAGNOSIS — D631 Anemia in chronic kidney disease: Secondary | ICD-10-CM | POA: Insufficient documentation

## 2023-02-14 DIAGNOSIS — E1159 Type 2 diabetes mellitus with other circulatory complications: Secondary | ICD-10-CM | POA: Insufficient documentation

## 2023-02-14 DIAGNOSIS — D509 Iron deficiency anemia, unspecified: Secondary | ICD-10-CM | POA: Insufficient documentation

## 2023-02-14 DIAGNOSIS — Z794 Long term (current) use of insulin: Secondary | ICD-10-CM | POA: Insufficient documentation

## 2023-02-14 DIAGNOSIS — Z01812 Encounter for preprocedural laboratory examination: Secondary | ICD-10-CM | POA: Diagnosis present

## 2023-02-14 LAB — BASIC METABOLIC PANEL
Anion gap: 9 (ref 5–15)
BUN: 63 mg/dL — ABNORMAL HIGH (ref 6–20)
CO2: 22 mmol/L (ref 22–32)
Calcium: 9 mg/dL (ref 8.9–10.3)
Chloride: 105 mmol/L (ref 98–111)
Creatinine, Ser: 2.77 mg/dL — ABNORMAL HIGH (ref 0.61–1.24)
GFR, Estimated: 25 mL/min — ABNORMAL LOW (ref >60)
Glucose, Bld: 120 mg/dL — ABNORMAL HIGH (ref 70–99)
Potassium: 4.8 mmol/L (ref 3.5–5.1)
Sodium: 136 mmol/L (ref 135–145)

## 2023-02-14 LAB — CBC WITH DIFFERENTIAL/PLATELET
Abs Immature Granulocytes: 0 10*3/uL (ref 0.00–0.07)
Basophils Absolute: 0 10*3/uL (ref 0.0–0.1)
Basophils Relative: 0 %
Eosinophils Absolute: 0.3 10*3/uL (ref 0.0–0.5)
Eosinophils Relative: 7 %
HCT: 33.6 % — ABNORMAL LOW (ref 39.0–52.0)
Hemoglobin: 11 g/dL — ABNORMAL LOW (ref 13.0–17.0)
Immature Granulocytes: 0 %
Lymphocytes Relative: 25 %
Lymphs Abs: 1.1 10*3/uL (ref 0.7–4.0)
MCH: 29.9 pg (ref 26.0–34.0)
MCHC: 32.7 g/dL (ref 30.0–36.0)
MCV: 91.3 fL (ref 80.0–100.0)
Monocytes Absolute: 0.3 10*3/uL (ref 0.1–1.0)
Monocytes Relative: 8 %
Neutro Abs: 2.7 10*3/uL (ref 1.7–7.7)
Neutrophils Relative %: 60 %
Platelets: 186 10*3/uL (ref 150–400)
RBC: 3.68 MIL/uL — ABNORMAL LOW (ref 4.22–5.81)
RDW: 12.9 % (ref 11.5–15.5)
WBC: 4.5 10*3/uL (ref 4.0–10.5)
nRBC: 0 % (ref 0.0–0.2)

## 2023-02-14 NOTE — Telephone Encounter (Signed)
This pt is from [facility] and was given his Plavix this morning. Will he need to be postponed?  9:37 AM You were added by Dolores Frame, MD. 9:39 AM DC Dolores Frame, MD yes, will need to reschedule  9:40 AM GW Myra Rude, RN thanks  9:40 AM DC Dolores Frame, MD lets make sure he had clearance to hold plavix for 5 days  9:40 AM I did receive clearance and faxed over instructions. I called [facility] and left a detailed message that I had sent instructions and that if she did not get them to call me  no one from there called me  9:40 AM DC Dolores Frame, MD ok, thanks, Almira Coaster please let the staff in there make a note about it  9:41 AM GW Myra Rude, RN I sent a progress note with the staff that was with him today and told them we would call with further information. I had a hard time getting anyone to answer today to see when he had his plavix last. Ugh!  9:46 AM DC Dolores Frame, MD thanks

## 2023-02-14 NOTE — Progress Notes (Signed)
Fax received from [facility] on 01/28/23 for medical clearance/medication hold for EGD/colonoscopy to be signed by C. Chestine Spore, MD.  Provider signed on 02/11/23, form faxed back to sender on 02/12/23, verified successful, sent to scan center.

## 2023-02-17 NOTE — Telephone Encounter (Signed)
[name] Clark ([phone])left voicemail to reschedule pt. Returned call to [place] but had to leave a Engineer, technical sales

## 2023-02-18 ENCOUNTER — Encounter (INDEPENDENT_AMBULATORY_CARE_PROVIDER_SITE_OTHER): Payer: Self-pay

## 2023-02-18 NOTE — Telephone Encounter (Signed)
[name] returned call and rescheduled patient to 03/11/23 at 9:45am. Instructions faxed and mailed to [facility]. Pt still has prep. Will call [name] with pre op appt information.

## 2023-02-19 ENCOUNTER — Other Ambulatory Visit: Payer: Self-pay

## 2023-02-20 NOTE — Telephone Encounter (Signed)
Left detailed message on Oscar Rose voicemail letting her know when pt pre op is. Pre op scheduled for 03/06/23 at 8 am via telephone

## 2023-02-24 ENCOUNTER — Encounter (HOSPITAL_COMMUNITY)
Admission: RE | Admit: 2023-02-24 | Discharge: 2023-02-24 | Disposition: A | Discharge status: Home / Self Care | Payer: Medicare Other | Source: Ambulatory Visit | Attending: Nephrology | Admitting: Nephrology

## 2023-02-24 VITALS — BP 129/55 | HR 62 | Temp 97.6°F | Resp 18

## 2023-02-24 DIAGNOSIS — N184 Chronic kidney disease, stage 4 (severe): Secondary | ICD-10-CM | POA: Diagnosis not present

## 2023-02-24 DIAGNOSIS — D631 Anemia in chronic kidney disease: Secondary | ICD-10-CM

## 2023-02-24 LAB — POCT HEMOGLOBIN-HEMACUE: Hemoglobin: 10.5 g/dL — ABNORMAL LOW (ref 13.0–17.0)

## 2023-02-24 MED ORDER — EPOETIN ALFA-EPBX 3000 UNIT/ML IJ SOLN: 3000.0000 [IU] | Freq: Once | INTRAMUSCULAR | Status: DC

## 2023-02-24 NOTE — Telephone Encounter (Signed)
Oscar Rose returned call and verified pre op date. Oscar Rose also confirmed she received instructions.

## 2023-02-24 NOTE — Progress Notes (Signed)
Patient Hgb. 10.5 no injection needed.

## 2023-02-24 NOTE — Addendum Note (Signed)
Encounter addended by: Tera Mater, [facility] on: 02/24/2023 8:57 AM  Actions taken: Order list changed

## 2023-03-04 ENCOUNTER — Telehealth: Payer: Self-pay | Admitting: Pharmacy Technician

## 2023-03-04 ENCOUNTER — Other Ambulatory Visit: Payer: Self-pay

## 2023-03-04 DIAGNOSIS — D631 Anemia in chronic kidney disease: Secondary | ICD-10-CM

## 2023-03-04 NOTE — Telephone Encounter (Signed)
Auth Submission: NO AUTH NEEDED Site of care: Site of care: [facility] Payer: Medicare A/B Medication & CPT/J Code(s) submitted: Retacrit [phone] Route of submission (phone, fax, portal):  Phone # Fax # Auth type: Buy/Bill HB Units/visits requested: 3000u q2weeks Reference number:  Approval from: 03/04/23 to 08/05/23

## 2023-03-06 ENCOUNTER — Encounter (HOSPITAL_COMMUNITY)
Admission: RE | Admit: 2023-03-06 | Discharge: 2023-03-06 | Disposition: A | Discharge status: Home / Self Care | Payer: Medicare Other | Source: Ambulatory Visit | Attending: Gastroenterology | Admitting: Gastroenterology

## 2023-03-10 ENCOUNTER — Encounter (HOSPITAL_COMMUNITY): Payer: Medicare Other | Attending: Nephrology | Admitting: *Deleted

## 2023-03-10 ENCOUNTER — Encounter (HOSPITAL_COMMUNITY): Payer: Medicare Other

## 2023-03-10 ENCOUNTER — Ambulatory Visit: Payer: Medicare Other

## 2023-03-10 VITALS — BP 134/66 | HR 61 | Temp 98.0°F | Resp 18

## 2023-03-10 DIAGNOSIS — N183 Chronic kidney disease, stage 3 unspecified: Secondary | ICD-10-CM | POA: Insufficient documentation

## 2023-03-10 DIAGNOSIS — D631 Anemia in chronic kidney disease: Secondary | ICD-10-CM | POA: Diagnosis present

## 2023-03-10 DIAGNOSIS — N189 Chronic kidney disease, unspecified: Secondary | ICD-10-CM | POA: Insufficient documentation

## 2023-03-10 NOTE — Progress Notes (Addendum)
Hgb. 12.5 no need for injection.

## 2023-03-11 ENCOUNTER — Encounter (HOSPITAL_COMMUNITY): Payer: Self-pay | Admitting: Gastroenterology

## 2023-03-11 ENCOUNTER — Ambulatory Visit (HOSPITAL_COMMUNITY): Payer: Medicare Other | Admitting: Anesthesiology

## 2023-03-11 ENCOUNTER — Telehealth (INDEPENDENT_AMBULATORY_CARE_PROVIDER_SITE_OTHER): Payer: Self-pay | Admitting: *Deleted

## 2023-03-11 ENCOUNTER — Encounter (HOSPITAL_COMMUNITY)
Admission: RE | Disposition: A | Discharge status: Skilled Nursing Facility | Payer: Self-pay | Source: Home / Self Care | Attending: Gastroenterology

## 2023-03-11 ENCOUNTER — Ambulatory Visit (HOSPITAL_BASED_OUTPATIENT_CLINIC_OR_DEPARTMENT_OTHER): Payer: Medicare Other | Admitting: Anesthesiology

## 2023-03-11 ENCOUNTER — Ambulatory Visit (HOSPITAL_COMMUNITY)
Admission: RE | Admit: 2023-03-11 | Discharge: 2023-03-11 | Disposition: A | Discharge status: Skilled Nursing Facility | Payer: Medicare Other | Attending: Gastroenterology | Admitting: Gastroenterology

## 2023-03-11 DIAGNOSIS — N182 Chronic kidney disease, stage 2 (mild): Secondary | ICD-10-CM | POA: Insufficient documentation

## 2023-03-11 DIAGNOSIS — K648 Other hemorrhoids: Secondary | ICD-10-CM

## 2023-03-11 DIAGNOSIS — D126 Benign neoplasm of colon, unspecified: Secondary | ICD-10-CM | POA: Diagnosis not present

## 2023-03-11 DIAGNOSIS — K3189 Other diseases of stomach and duodenum: Secondary | ICD-10-CM | POA: Insufficient documentation

## 2023-03-11 DIAGNOSIS — K219 Gastro-esophageal reflux disease without esophagitis: Secondary | ICD-10-CM | POA: Insufficient documentation

## 2023-03-11 DIAGNOSIS — Z794 Long term (current) use of insulin: Secondary | ICD-10-CM | POA: Diagnosis not present

## 2023-03-11 DIAGNOSIS — Z7984 Long term (current) use of oral hypoglycemic drugs: Secondary | ICD-10-CM | POA: Insufficient documentation

## 2023-03-11 DIAGNOSIS — E1122 Type 2 diabetes mellitus with diabetic chronic kidney disease: Secondary | ICD-10-CM | POA: Diagnosis not present

## 2023-03-11 DIAGNOSIS — E1151 Type 2 diabetes mellitus with diabetic peripheral angiopathy without gangrene: Secondary | ICD-10-CM | POA: Insufficient documentation

## 2023-03-11 DIAGNOSIS — I129 Hypertensive chronic kidney disease with stage 1 through stage 4 chronic kidney disease, or unspecified chronic kidney disease: Secondary | ICD-10-CM | POA: Insufficient documentation

## 2023-03-11 DIAGNOSIS — I130 Hypertensive heart and chronic kidney disease with heart failure and stage 1 through stage 4 chronic kidney disease, or unspecified chronic kidney disease: Secondary | ICD-10-CM | POA: Diagnosis not present

## 2023-03-11 DIAGNOSIS — K259 Gastric ulcer, unspecified as acute or chronic, without hemorrhage or perforation: Secondary | ICD-10-CM

## 2023-03-11 DIAGNOSIS — Z7902 Long term (current) use of antithrombotics/antiplatelets: Secondary | ICD-10-CM | POA: Diagnosis not present

## 2023-03-11 DIAGNOSIS — D120 Benign neoplasm of cecum: Secondary | ICD-10-CM

## 2023-03-11 DIAGNOSIS — K2980 Duodenitis without bleeding: Secondary | ICD-10-CM

## 2023-03-11 DIAGNOSIS — G4730 Sleep apnea, unspecified: Secondary | ICD-10-CM | POA: Insufficient documentation

## 2023-03-11 DIAGNOSIS — D509 Iron deficiency anemia, unspecified: Secondary | ICD-10-CM | POA: Insufficient documentation

## 2023-03-11 DIAGNOSIS — N184 Chronic kidney disease, stage 4 (severe): Secondary | ICD-10-CM

## 2023-03-11 DIAGNOSIS — I5023 Acute on chronic systolic (congestive) heart failure: Secondary | ICD-10-CM

## 2023-03-11 DIAGNOSIS — D122 Benign neoplasm of ascending colon: Secondary | ICD-10-CM

## 2023-03-11 HISTORY — PX: POLYPECTOMY: SHX5525

## 2023-03-11 HISTORY — PX: ESOPHAGOGASTRODUODENOSCOPY (EGD) WITH PROPOFOL: SHX5813

## 2023-03-11 HISTORY — PX: COLONOSCOPY WITH PROPOFOL: SHX5780

## 2023-03-11 HISTORY — PX: BIOPSY: SHX5522

## 2023-03-11 LAB — GLUCOSE, CAPILLARY: Glucose-Capillary: 92 mg/dL (ref 70–99)

## 2023-03-11 SURGERY — ESOPHAGOGASTRODUODENOSCOPY (EGD) WITH PROPOFOL
Anesthesia: General

## 2023-03-11 MED ORDER — LIDOCAINE HCL (PF) 2 % IJ SOLN
INTRAMUSCULAR | Status: AC
  Filled 2023-03-11: qty 10

## 2023-03-11 MED ORDER — LIDOCAINE HCL (PF) 2 % IJ SOLN
INTRAMUSCULAR | Status: AC
  Filled 2023-03-11: qty 5

## 2023-03-11 MED ORDER — PHENYLEPHRINE 80 MCG/ML (10ML) SYRINGE FOR IV PUSH (FOR BLOOD PRESSURE SUPPORT)
PREFILLED_SYRINGE | INTRAVENOUS | Status: DC | PRN
  Administered 2023-03-11 (×3): 160 ug via INTRAVENOUS

## 2023-03-11 MED ORDER — PROPOFOL 500 MG/50ML IV EMUL
INTRAVENOUS | Status: DC | PRN
  Administered 2023-03-11: 150 ug/kg/min via INTRAVENOUS

## 2023-03-11 MED ORDER — LIDOCAINE HCL (CARDIAC) PF 100 MG/5ML IV SOSY
PREFILLED_SYRINGE | INTRAVENOUS | Status: DC | PRN
  Administered 2023-03-11: 100 mg via INTRAVENOUS

## 2023-03-11 MED ORDER — PROPOFOL 1000 MG/100ML IV EMUL
INTRAVENOUS | Status: AC
  Filled 2023-03-11: qty 100

## 2023-03-11 MED ORDER — PROPOFOL 500 MG/50ML IV EMUL
INTRAVENOUS | Status: AC
  Filled 2023-03-11: qty 50

## 2023-03-11 MED ORDER — PANTOPRAZOLE SODIUM 40 MG PO TBEC: 40.0000 mg | DELAYED_RELEASE_TABLET | Freq: Every day | ORAL | 3 refills | Status: AC

## 2023-03-11 MED ORDER — LACTATED RINGERS IV SOLN: INTRAVENOUS | Status: DC

## 2023-03-11 MED ORDER — PROPOFOL 10 MG/ML IV BOLUS
INTRAVENOUS | Status: DC | PRN
  Administered 2023-03-11: 30 mg via INTRAVENOUS
  Administered 2023-03-11: 80 mg via INTRAVENOUS
  Administered 2023-03-11: 30 mg via INTRAVENOUS

## 2023-03-11 MED ORDER — PHENYLEPHRINE 80 MCG/ML (10ML) SYRINGE FOR IV PUSH (FOR BLOOD PRESSURE SUPPORT)
PREFILLED_SYRINGE | INTRAVENOUS | Status: AC
  Filled 2023-03-11: qty 10

## 2023-03-11 NOTE — Telephone Encounter (Signed)
Yes, please confirm with her it is ok to start pantoprazole along with Plavix thanks

## 2023-03-11 NOTE — Anesthesia Postprocedure Evaluation (Signed)
Anesthesia Post Note  [name]  Procedure(s) Performed: ESOPHAGOGASTRODUODENOSCOPY (EGD) WITH PROPOFOL COLONOSCOPY WITH PROPOFOL BIOPSY POLYPECTOMY  Patient location during evaluation: Phase II Anesthesia Type: General Level of consciousness: awake and alert and oriented Pain management: pain level controlled Vital Signs Assessment: post-procedure vital signs reviewed and stable Respiratory status: spontaneous breathing, nonlabored ventilation and respiratory function stable Cardiovascular status: blood pressure returned to baseline and stable Postop Assessment: no apparent nausea or vomiting Anesthetic complications: no  No notable events documented.   Last Vitals:  Vitals:   03/11/23 0803 03/11/23 1034  BP: (!) 171/68 130/68  Pulse: 66 71  Resp: 18 15  Temp: 36.7 C 36.7 C  SpO2: 99% 100%    Last Pain:  Vitals:   03/11/23 1034  TempSrc: Oral  PainSc: 0-No pain                  C 

## 2023-03-11 NOTE — H&P (Signed)
Oscar Rose is an 62 y.o. male.   Chief Complaint: iron deficiency anemia HPI: Oscar Rose is a 62 y.o. male with past medical history of ARDS, CKD, depression, neuropathy, GERD, HLD, HTN, legal blindness of L eye, PVD, RLS, sleep apnea, stroke, Type 2 DM, recent TP trunk and PT angioplasty, coming for IDA.  The patient has never had a colonoscopy in the past.  The patient denies having any complaints such as melena, hematochezia, abdominal pain or distention, change in her bowel movement consistency or frequency, no changes in weight recently.  No family history of colorectal cancer.  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  STROKE  JULY  2017   Tinea unguium    Type 2 diabetes mellitus (HCC)    Vitamin D deficiency     Past Surgical History:  Procedure Laterality Date   ABDOMINAL AORTOGRAM W/LOWER EXTREMITY N/A 11/08/2021   Procedure: ABDOMINAL AORTOGRAM W/LOWER EXTREMITY;  Surgeon: Cephus Shelling, MD;  Location: [facility];  Service: Cardiovascular;  Laterality: N/A;   ABDOMINAL AORTOGRAM W/LOWER EXTREMITY Right 11/07/2022   Procedure: ABDOMINAL AORTOGRAM W/LOWER EXTREMITY;  Surgeon: Cephus Shelling, MD;  Location: [facility];  Service: Cardiovascular;  Laterality: Right;   AMPUTATION Bilateral 2012   Left big toe partial and right big toe complete   CARDIOVASCULAR STRESS TEST  04-05-2014  dr croitoru   low risk lexiscan nuclear study with mild diaphragmatic attenuation artifact/  no ischemia/  ef 58%   CATARACT EXTRACTION W/ INTRAOCULAR LENS  IMPLANT, BILATERAL  2013   CYSTO /  LEFT URETERAL STENT PLACEMENT  2012   CYSTOSCOPY W/ URETERAL STENT REMOVAL Left 07/07/2014   Procedure: CYSTO WITH LEFT PORTION STENT REMOVAL;  Surgeon: Anner Crete, MD;  Location: [facility];  Service: Urology;  Laterality: Left;   CYSTOSCOPY WITH URETEROSCOPY AND STENT PLACEMENT Left 07/07/2014   Procedure: CYSTOLITHALOPAXY URETEROSCOPY WITH STENT;  Surgeon: Anner Crete, MD;  Location: [facility];  Service: Urology;  Laterality: Left;   ENDARTERECTOMY Right 04/04/2016   Procedure: ENDARTERECTOMY CAROTID ARTERY RIGHT;  Surgeon: Chuck Hint, MD;  Location: [facility] OR;  Service: Vascular;  Laterality: Right;   HOLMIUM LASER APPLICATION N/A 07/07/2014   Procedure: HOLMIUM LASER APPLICATION;  Surgeon: Anner Crete, MD;  Location: [facility];  Service: Urology;  Laterality: N/A;   I & D  INFECTED SPIDER BITE UPPER BACK  06/ 2012   NEPHROLITHOTOMY Left  08/16/2014   Procedure: LEFT NEPHROLITHOTOMY PERCUTANEOUS;  Surgeon: Anner Crete, MD;  Location: [facility];  Service: Urology;  Laterality: Left;   NEPHROLITHOTOMY Left 08/18/2014   Procedure: LEFT NEPHROLITHOTOMY PERCUTANEOUS SECOND LOOK;  Surgeon: Anner Crete, MD;  Location: [facility];  Service: Urology;  Laterality: Left;   PATCH ANGIOPLASTY Right 04/04/2016   Procedure: PATCH ANGIOPLASTY RIGHT CAROTID ARTERY;  Surgeon: Chuck Hint, MD;  Location: [facility] OR;  Service: Vascular;  Laterality: Right;   PERIPHERAL VASCULAR BALLOON ANGIOPLASTY  11/08/2021   Procedure: PERIPHERAL VASCULAR BALLOON ANGIOPLASTY;  Surgeon: Cephus Shelling, MD;   Location: [facility];  Service: Cardiovascular;;  Left AT   PERIPHERAL VASCULAR BALLOON ANGIOPLASTY Right 11/07/2022   Procedure: PERIPHERAL VASCULAR BALLOON ANGIOPLASTY;  Surgeon: Cephus Shelling, MD;  Location: [facility];  Service: Cardiovascular;  Laterality: Right;  right TP trunk and PT   RETINAL DETACHMENT SURGERY Bilateral 2013   TONSILLECTOMY AND ADENOIDECTOMY  as child    Family History  Problem Relation Age of Onset   Emphysema Mother    Cancer Mother        Throat   Heart disease Mother    Stroke Neg Hx    Social History:  reports that he has never smoked. He has never used smokeless tobacco. He reports that he does not currently use alcohol. He reports that he does not use drugs.  Allergies: No Known Allergies  Medications Prior to Admission  Medication Sig Dispense Refill   Ascorbic Acid (VITAMIN C PO) Take 500 mg by mouth 2 (two) times daily.     aspirin EC 81 MG tablet Take 81 mg by mouth daily.     atorvastatin (LIPITOR) 40 MG tablet Take 40 mg by mouth at bedtime.     Baclofen 5 MG TABS Take 1 tablet (5 mg total) by mouth 3 (three) times daily as needed (muscle spasm). (Patient taking differently: Take 5 mg by mouth every 8 (eight) hours as needed (muscle spasm).) 90 tablet    busPIRone (BUSPAR) 5 MG tablet Take 5 mg by mouth 2 (two) times daily.     cloNIDine (CATAPRES - DOSED IN MG/24 HR) 0.3 mg/24hr patch Place 1 patch (0.3 mg total) onto the skin once a week. (Patient taking differently: Place 0.3 mg onto the skin every Friday.) 4 patch 12   clopidogrel (PLAVIX) 75 MG tablet Take 75 mg by mouth daily.     ferrous sulfate 325 (65 FE) MG tablet Take 325 mg by mouth daily with breakfast.     furosemide (LASIX) 40 MG tablet Take 1 tablet (40 mg total) by mouth daily. 30 tablet 1   insulin glargine (LANTUS) 100 UNIT/ML injection Inject 0.2 mLs (20 Units total) into the skin at bedtime. 10 mL 11   insulin lispro (ADMELOG) 100 UNIT/ML injection Inject  3 Units into the skin 3 (three) times daily before meals. Hold for blood sugar <200     labetalol (NORMODYNE) 200 MG tablet Take 1 tablet (200 mg total) by mouth 2 (two) times daily. 60 tablet 0   meclizine (ANTIVERT) 25 MG tablet Take 25 mg by mouth every 8 (eight) hours.     mineral oil-hydrophilic petrolatum (AQUAPHOR) ointment Apply 1 Application topically at bedtime.     Multiple Vitamin (MULTIVITAMIN) tablet Take 1 tablet by mouth daily.     olmesartan (BENICAR) 5 MG tablet Take 5 mg by mouth daily.     sertraline (ZOLOFT) 25 MG tablet Take  1 tablet (25 mg total) by mouth daily. 30 tablet 0   traMADol (ULTRAM) 50 MG tablet Take 1 tablet (50 mg total) by mouth 2 (two) times daily as needed for moderate pain. 10 tablet 0   OXYGEN Inhale 2 L/min into the lungs as needed (to keep O2 saturation greater than 90%).     tamsulosin (FLOMAX) 0.4 MG CAPS capsule Take 0.4 mg by mouth at bedtime.      Results for orders placed or performed during the hospital encounter of 03/11/23 (from the past 48 hour(s))  Glucose, capillary     Status: None   Collection Time: 03/11/23  7:46 AM  Result Value Ref Range   Glucose-Capillary 92 70 - 99 mg/dL    Comment: Glucose reference range applies only to samples taken after fasting for at least 8 hours.   No results found.  Review of Systems  All other systems reviewed and are negative.   Blood pressure (!) 171/68, pulse 66, temperature 98 F (36.7 C), resp. rate 18, height 5' 10.5" (1.791 m), weight 107.1 kg, SpO2 99%. Physical Exam  GENERAL: The patient is AO x3, in no acute distress. HEENT: Head is normocephalic and atraumatic. EOMI are intact. Mouth is well hydrated and without lesions. NECK: Supple. No masses LUNGS: Clear to auscultation. No presence of rhonchi/wheezing/rales. Adequate chest expansion HEART: RRR, normal s1 and s2. ABDOMEN: Soft, nontender, no guarding, no peritoneal signs, and nondistended. BS +. No masses. EXTREMITIES: Without any  cyanosis, clubbing, rash, lesions or edema. NEUROLOGIC: AOx3, no focal motor deficit. SKIN: no jaundice, no rashes  Assessment/Plan JEREMIHA LAPUMA is a 62 y.o. male with past medical history of ARDS, CKD, depression, neuropathy, GERD, HLD, HTN, legal blindness of L eye, PVD, RLS, sleep apnea, stroke, Type 2 DM, recent TP trunk and PT angioplasty, coming for IDA. Will proceed with colonoscopy.  Dolores Frame, MD 03/11/2023, 9:33 AM

## 2023-03-11 NOTE — Transfer of Care (Addendum)
Immediate Anesthesia Transfer of Care Note  [name]  Procedure(s) Performed: ESOPHAGOGASTRODUODENOSCOPY (EGD) WITH PROPOFOL COLONOSCOPY WITH PROPOFOL BIOPSY POLYPECTOMY  Patient Location: Short Stay  Anesthesia Type:General  Level of Consciousness: awake and patient cooperative  Airway & Oxygen Therapy: Patient Spontanous Breathing and Patient connected to nasal cannula oxygen  Post-op Assessment: Report given to RN and Post -op Vital signs reviewed and stable  Post vital signs: Reviewed and stable  Last Vitals:  Vitals Value Taken Time  BP 130/68 03/11/23   1034  Temp 36.7 03/11/23   1034  Pulse 70 03/11/23   1034  Resp 15 03/11/23   1034  SpO2 100% 03/11/23   1034    Last Pain:  Vitals:   03/11/23 0939  PainSc: 0-No pain      Patients Stated Pain Goal: 3 (03/11/23 0801)  Complications: No notable events documented.

## 2023-03-11 NOTE — Op Note (Signed)
[facility] Patient Name: Oscar Rose Procedure Date: 03/11/2023 9:28 AM MRN: [id number] Date of Birth: Feb 21, 1961 Attending MD: Katrinka Blazing , , [id number] CSN: [id number] Age: 62 Admit Type: Outpatient Procedure:                Colonoscopy Indications:              Iron deficiency anemia Providers:                Katrinka Blazing, Edrick Kins, RN, Elinor Parkinson, Dyann Ruddle Referring MD:              Medicines:                Monitored Anesthesia Care Complications:            No immediate complications. Estimated Blood Loss:     Estimated blood loss: none. Procedure:                Pre-Anesthesia Assessment:                           - Prior to the procedure, a History and Physical                            was performed, and patient medications, allergies                            and sensitivities were reviewed. The patient's                            tolerance of previous anesthesia was reviewed.                           - The risks and benefits of the procedure and the                            sedation options and risks were discussed with the                            patient. All questions were answered and informed                            consent was obtained.                           - ASA Grade Assessment: III - A patient with severe                            systemic disease.                           After obtaining informed consent, the colonoscope                            was passed under direct vision. Throughout the  procedure, the patient's blood pressure, pulse, and                            oxygen saturations were monitored continuously. The                            [id number]-HQ190L ([id number]) scope was introduced through                            the anus and advanced to the the cecum, identified                            by appendiceal orifice and ileocecal valve. The                             colonoscopy was performed without difficulty. The                            patient tolerated the procedure well. The quality                            of the bowel preparation was adequate to identify                            polyps greater than 5 mm in size. Scope In: 9:57:32 AM Scope Out: 10:32:13 AM Scope Withdrawal Time: 0 hours 19 minutes 22 seconds  Total Procedure Duration: 0 hours 34 minutes 41 seconds  Findings:      The perianal and digital rectal examinations were normal.      A 7 mm polyp was found in the cecum. The polyp was sessile. The polyp       was removed with a cold snare. Resection and retrieval were complete.      Two sessile polyps were found in the ascending colon. The polyps were 1       to 2 mm in size. These polyps were removed with a cold biopsy forceps.       Resection and retrieval were complete.      Non-bleeding internal hemorrhoids were found during retroflexion. The       hemorrhoids were small. Impression:               - One 7 mm polyp in the cecum, removed with a cold                            snare. Resected and retrieved.                           - Two 1 to 2 mm polyps in the ascending colon,                            removed with a cold biopsy forceps. Resected and                            retrieved.                           -  Non-bleeding internal hemorrhoids. Moderate Sedation:      Per Anesthesia Care Recommendation:           - Discharge patient to home (ambulatory).                           - Resume previous diet.                           - Await pathology results.                           - Repeat colonoscopy in 5 years for surveillance.                           - Restart Plavix today. Procedure Code(s):        --- Professional ---                           [phone], Colonoscopy, flexible; with removal of                            tumor(s), polyp(s), or other lesion(s) by snare                             technique                           45380, 59, Colonoscopy, flexible; with biopsy,                            single or multiple Diagnosis Code(s):        --- Professional ---                           D12.0, Benign neoplasm of cecum                           D12.2, Benign neoplasm of ascending colon                           K64.8, Other hemorrhoids                           D50.9, Iron deficiency anemia, unspecified CPT copyright 2022 American [employer] Association. All rights reserved. The codes documented in this report are preliminary and upon coder review may  be revised to meet current compliance requirements. Katrinka Blazing, MD Katrinka Blazing,  03/11/2023 10:41:25 AM This report has been signed electronically. Number of Addenda: 0

## 2023-03-11 NOTE — Discharge Instructions (Addendum)
You are being discharged to home.  Resume your previous diet.  We are waiting for your pathology results.  Take Protonix (pantoprazole) 40 mg by mouth once a day.  Do not take any high dose aspirin, ibuprofen (including Advil, Motrin or Nuprin), naproxen (including Aleve), or any other non-steroidal anti-inflammatory drugs.  Your physician has recommended a repeat colonoscopy in five years for surveillance.  Restart Plavix today.

## 2023-03-11 NOTE — Op Note (Signed)
[facility] Patient Name: Oscar Rose Procedure Date: 03/11/2023 9:29 AM MRN: [id number] Date of Birth: March 07, 1961 Attending MD: Katrinka Blazing , , [id number] CSN: [id number] Age: 62 Admit Type: Outpatient Procedure:                Upper GI endoscopy Indications:              Iron deficiency anemia Providers:                Katrinka Blazing, Edrick Kins, RN, Elinor Parkinson, Dyann Ruddle Referring MD:              Medicines:                Monitored Anesthesia Care Complications:            No immediate complications. Estimated Blood Loss:     Estimated blood loss: none. Procedure:                Pre-Anesthesia Assessment:                           - Prior to the procedure, a History and Physical                            was performed, and patient medications, allergies                            and sensitivities were reviewed. The patient's                            tolerance of previous anesthesia was reviewed.                           - The risks and benefits of the procedure and the                            sedation options and risks were discussed with the                            patient. All questions were answered and informed                            consent was obtained.                           - ASA Grade Assessment: III - A patient with severe                            systemic disease.                           After obtaining informed consent, the endoscope was                            passed under direct vision. Throughout the  procedure, the patient's blood pressure, pulse, and                            oxygen saturations were monitored continuously. The                            [id number]-H190 ([id number]) scope was introduced through the                            mouth, and advanced to the second part of duodenum.                            The upper GI endoscopy was accomplished without                             difficulty. The patient tolerated the procedure                            well. Scope In: 9:43:26 AM Scope Out: 9:50:58 AM Total Procedure Duration: 0 hours 7 minutes 32 seconds  Findings:      The examined esophagus was normal.      Multiple localized small erosions with no stigmata of recent bleeding       were found in the gastric body. Biopsies were taken with a cold forceps       for Helicobacter pylori testing.      One non-bleeding superficial gastric ulcer with an eschar ulcer base       (Forrest Class III) was found in the gastric antrum. The lesion was 10       mm in largest dimension. Biopsies were taken with a cold forceps for       histology.      Diffuse mildly erythematous mucosa without active bleeding was found in       the duodenal bulb. Impression:               - Normal esophagus.                           - Erosive gastropathy with no stigmata of recent                            bleeding. Biopsied.                           - Non-bleeding gastric ulcer with a clean ulcer                            base (Forrest Class III). Biopsied.                           - Erythematous duodenopathy. Moderate Sedation:      Per Anesthesia Care Recommendation:           - Discharge patient to home (ambulatory).                           - Resume previous diet.                           -  Await pathology results.                           - Use Protonix (pantoprazole) 40 mg PO daily.                           - No high dose aspirin, ibuprofen, naproxen, or                            other non-steroidal anti-inflammatory drugs. Procedure Code(s):        --- Professional ---                           [phone], Esophagogastroduodenoscopy, flexible,                            transoral; with biopsy, single or multiple Diagnosis Code(s):        --- Professional ---                           K31.89, Other diseases of stomach and duodenum                            K25.9, Gastric ulcer, unspecified as acute or                            chronic, without hemorrhage or perforation                           D50.9, Iron deficiency anemia, unspecified CPT copyright 2022 American Medical Association. All rights reserved. The codes documented in this report are preliminary and upon coder review may  be revised to meet current compliance requirements. Katrinka Blazing, MD Katrinka Blazing,  03/11/2023 9:56:44 AM This report has been signed electronically. Number of Addenda: 0

## 2023-03-11 NOTE — Telephone Encounter (Signed)
Pt had tcs today and Scientist, research (life sciences) at [facility] called to clarify script for protonix that was sent in. She said when she put in it system it flagged for med interaction with plavix  - may increase adverse cardiovascular events. Wanted to check to see if provider wanted to continue with order and she will wait to hear back before putting in order   [phone]

## 2023-03-11 NOTE — Anesthesia Preprocedure Evaluation (Signed)
Anesthesia Evaluation  Patient identified by MRN, date of birth, ID band Patient awake    Reviewed: Allergy & Precautions, H&P , NPO status , Patient's Chart, lab work & pertinent test results, reviewed documented beta blocker date and time   Airway Mallampati: II  TM Distance: >3 FB Neck ROM: Full    Dental  (+) Dental Advisory Given, Loose, Missing, Poor Dentition,    Pulmonary sleep apnea , pneumonia   Pulmonary exam normal breath sounds clear to auscultation       Cardiovascular hypertension, Pt. on medications and Pt. on home beta blockers + Peripheral Vascular Disease and +CHF  Normal cardiovascular exam Rhythm:Regular Rate:Normal   1. Limited Echo to evaluate LVEF. Poor acoustic windows. Definity  contrast administered.   2. Left ventricular ejection fraction, by estimation, is 55 to 60%. The  left ventricle has normal function. The left ventricle demonstrates  regional wall motion abnormalities (see scoring diagram/findings for  description).   3. Right ventricular systolic function was not well visualized. The right  ventricular size is not well visualized.   4. The mitral valve is grossly normal. No evidence of mitral valve  regurgitation. No evidence of mitral stenosis.   5. The aortic valve is tricuspid. Aortic valve regurgitation is not  visualized. No aortic stenosis is present.     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?), Residual Symptoms    GI/Hepatic Neg liver ROS,GERD  Medicated,,  Endo/Other  diabetes, Well Controlled, Type 2, Oral Hypoglycemic Agents, Insulin Dependent    Renal/GU Renal InsufficiencyRenal disease  negative genitourinary   Musculoskeletal  (+) Arthritis , Osteoarthritis,    Abdominal   Peds negative pediatric ROS (+)  Hematology  (+) Blood dyscrasia, anemia   Anesthesia Other Findings    Reproductive/Obstetrics negative OB ROS                             Anesthesia Physical Anesthesia Plan  ASA: 3  Anesthesia Plan: General   Post-op Pain Management: Minimal or no pain anticipated   Induction: Intravenous  PONV Risk Score and Plan: 1 and Treatment may vary due to age or medical condition  Airway Management Planned: Nasal Cannula and Natural Airway  Additional Equipment:   Intra-op Plan:   Post-operative Plan:   Informed Consent: I have reviewed the patients History and Physical, chart, labs and discussed the procedure including the risks, benefits and alternatives for the proposed anesthesia with the patient or authorized representative who has indicated his/her understanding and acceptance.     Dental advisory given  Plan Discussed with: CRNA and Surgeon  Anesthesia Plan Comments:         Anesthesia Quick Evaluation

## 2023-03-11 NOTE — Telephone Encounter (Signed)
Called samantha a [facility] and left her a message on her vm that it is ok to start pantoprazole along with plavix per Dr. Levon Hedger and to call back if any issues.

## 2023-03-17 ENCOUNTER — Encounter (INDEPENDENT_AMBULATORY_CARE_PROVIDER_SITE_OTHER): Payer: Self-pay | Admitting: *Deleted

## 2023-03-17 ENCOUNTER — Encounter (HOSPITAL_COMMUNITY): Payer: Self-pay | Admitting: Gastroenterology

## 2023-03-24 ENCOUNTER — Encounter (HOSPITAL_COMMUNITY): Payer: Medicare Other

## 2023-03-24 ENCOUNTER — Encounter: Payer: Medicare Other | Admitting: *Deleted

## 2023-03-24 VITALS — BP 171/65 | HR 65 | Temp 97.9°F | Resp 18

## 2023-03-24 DIAGNOSIS — N183 Chronic kidney disease, stage 3 unspecified: Secondary | ICD-10-CM | POA: Diagnosis not present

## 2023-03-24 DIAGNOSIS — D631 Anemia in chronic kidney disease: Secondary | ICD-10-CM

## 2023-03-24 MED ORDER — EPOETIN ALFA-EPBX 3000 UNIT/ML IJ SOLN: 3000.0000 [IU] | Freq: Once | INTRAMUSCULAR | Status: DC

## 2023-03-24 NOTE — Progress Notes (Signed)
Hgb. 10.7 no injection needed

## 2023-04-04 ENCOUNTER — Other Ambulatory Visit: Payer: Self-pay

## 2023-04-04 DIAGNOSIS — D649 Anemia, unspecified: Secondary | ICD-10-CM

## 2023-04-08 ENCOUNTER — Encounter: Payer: Self-pay | Admitting: Hematology

## 2023-04-08 ENCOUNTER — Inpatient Hospital Stay: Payer: Medicare Other | Attending: Hematology

## 2023-04-08 ENCOUNTER — Inpatient Hospital Stay (HOSPITAL_BASED_OUTPATIENT_CLINIC_OR_DEPARTMENT_OTHER): Payer: Medicare Other | Admitting: Hematology

## 2023-04-08 ENCOUNTER — Inpatient Hospital Stay: Payer: Medicare Other

## 2023-04-08 VITALS — BP 176/73 | HR 66 | Temp 97.4°F | Resp 18 | Ht 70.5 in | Wt 244.6 lb

## 2023-04-08 DIAGNOSIS — I509 Heart failure, unspecified: Secondary | ICD-10-CM | POA: Diagnosis not present

## 2023-04-08 DIAGNOSIS — Z794 Long term (current) use of insulin: Secondary | ICD-10-CM | POA: Insufficient documentation

## 2023-04-08 DIAGNOSIS — D649 Anemia, unspecified: Secondary | ICD-10-CM

## 2023-04-08 DIAGNOSIS — N182 Chronic kidney disease, stage 2 (mild): Secondary | ICD-10-CM

## 2023-04-08 DIAGNOSIS — Z7982 Long term (current) use of aspirin: Secondary | ICD-10-CM | POA: Insufficient documentation

## 2023-04-08 DIAGNOSIS — E1122 Type 2 diabetes mellitus with diabetic chronic kidney disease: Secondary | ICD-10-CM | POA: Diagnosis not present

## 2023-04-08 DIAGNOSIS — Z7902 Long term (current) use of antithrombotics/antiplatelets: Secondary | ICD-10-CM | POA: Diagnosis not present

## 2023-04-08 DIAGNOSIS — D631 Anemia in chronic kidney disease: Secondary | ICD-10-CM | POA: Insufficient documentation

## 2023-04-08 DIAGNOSIS — I130 Hypertensive heart and chronic kidney disease with heart failure and stage 1 through stage 4 chronic kidney disease, or unspecified chronic kidney disease: Secondary | ICD-10-CM | POA: Insufficient documentation

## 2023-04-08 DIAGNOSIS — E1151 Type 2 diabetes mellitus with diabetic peripheral angiopathy without gangrene: Secondary | ICD-10-CM | POA: Diagnosis not present

## 2023-04-08 DIAGNOSIS — Z79899 Other long term (current) drug therapy: Secondary | ICD-10-CM | POA: Diagnosis not present

## 2023-04-08 DIAGNOSIS — E611 Iron deficiency: Secondary | ICD-10-CM | POA: Diagnosis not present

## 2023-04-08 DIAGNOSIS — E1142 Type 2 diabetes mellitus with diabetic polyneuropathy: Secondary | ICD-10-CM | POA: Diagnosis not present

## 2023-04-08 LAB — FERRITIN: Ferritin: 277 ng/mL (ref 24–336)

## 2023-04-08 LAB — CBC WITH DIFFERENTIAL/PLATELET
Abs Immature Granulocytes: 0.02 10*3/uL (ref 0.00–0.07)
Basophils Absolute: 0 10*3/uL (ref 0.0–0.1)
Basophils Relative: 1 %
Eosinophils Absolute: 0.3 10*3/uL (ref 0.0–0.5)
Eosinophils Relative: 5 %
HCT: 32.5 % — ABNORMAL LOW (ref 39.0–52.0)
Hemoglobin: 10.6 g/dL — ABNORMAL LOW (ref 13.0–17.0)
Immature Granulocytes: 0 %
Lymphocytes Relative: 21 %
Lymphs Abs: 1.2 10*3/uL (ref 0.7–4.0)
MCH: 30.5 pg (ref 26.0–34.0)
MCHC: 32.6 g/dL (ref 30.0–36.0)
MCV: 93.7 fL (ref 80.0–100.0)
Monocytes Absolute: 0.4 10*3/uL (ref 0.1–1.0)
Monocytes Relative: 8 %
Neutro Abs: 3.7 10*3/uL (ref 1.7–7.7)
Neutrophils Relative %: 65 %
Platelets: 210 10*3/uL (ref 150–400)
RBC: 3.47 MIL/uL — ABNORMAL LOW (ref 4.22–5.81)
RDW: 12.9 % (ref 11.5–15.5)
WBC: 5.6 10*3/uL (ref 4.0–10.5)
nRBC: 0 % (ref 0.0–0.2)

## 2023-04-08 LAB — IRON AND TIBC
Iron: 77 ug/dL (ref 45–182)
Saturation Ratios: 28 % (ref 17.9–39.5)
TIBC: 274 ug/dL (ref 250–450)
UIBC: 197 ug/dL

## 2023-04-08 NOTE — Patient Instructions (Signed)
[facility]  Discharge Instructions  You were seen and examined today by Dr. Ellin Saba. Dr. Ellin Saba is a hematologist, meaning that he specializes in blood abnormalities. Dr. Ellin Saba discussed your past medical history, family history of cancers/blood conditions and the events that led to you being here today.  You were referred to Dr. Ellin Saba due to Anemia.  Dr. Ellin Saba has recommended having labs checked every 4 weeks with possible Aranesp injection if hemoglobin is under 10.   Follow-up as scheduled.    Thank you for choosing [facility] to provide your oncology and hematology care.   To afford each patient quality time with our provider, please arrive at least 15 minutes before your scheduled appointment time. You may need to reschedule your appointment if you arrive late (10 or more minutes). Arriving late affects you and other patients whose appointments are after yours.  Also, if you miss three or more appointments without notifying the office, you may be dismissed from the clinic at the provider's discretion.    Again, thank you for choosing [facility]  Our hope is that these requests will decrease the amount of time that you wait before being seen by our physicians.   If you have a lab appointment with the Cancer Center - please note that after April 8th, all labs will be drawn in the cancer center.  You do not have to check in or register with the main entrance as you have in the past but will complete your check-in at the cancer center.            _____________________________________________________________  Should you have questions after your visit to [facility], please contact our office at [phone] and follow the prompts.  Our office hours are 8:00 a.m. to 4:30 p.m. Monday - Thursday and 8:00 a.m. to 2:30 p.m. Friday.  Please note that voicemails left after 4:00 p.m. may not  be returned until the following business day.  We are closed weekends and all major holidays.  You do have access to a nurse 24-7, just call the main number to the clinic [phone] and do not press any options, hold on the line and a nurse will answer the phone.    For prescription refill requests, have your pharmacy contact our office and allow 72 hours.    Masks are no longer required in the cancer centers. If you would like for your care team to wear a mask while they are taking care of you, please let them know. You may have one support person who is at least 60 years old accompany you for your appointments.

## 2023-04-08 NOTE — Progress Notes (Signed)
[facility] [address], [place] [address]   Clinic Day:  04/08/2023  Referring physician: Lindaann Pascal  Patient Care Team: Lindaann Pascal as PCP - General (Skilled Nursing Facility)   ASSESSMENT & PLAN:   Assessment:  1.  Normocytic anemia from CKD: - Patient seen at the request of Dr. Wolfgang Phoenix. - Patient initiated on Retacrit, received 1 dose 3000 units on 12/16/2022.  Last Feraheme was on 11/11/2022. - Colonoscopy (03/11/2011): Nonbleeding internal hemorrhoids.  Tubular adenoma in the cecum and sessile serrated polyp removed. - EGD (03/11/2023): Normal esophagus, erosive gastropathy, nonbleeding gastric ulcer, biopsied was benign. - Last blood transfusion in March 2024.  No ice pica.  No BRBPR/melena.  2. 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 Worked in Clinical biochemist. No chemical exposure. No tobacco use. No family history of anemia. Mother had throat cancer.    Plan:  1.  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 - Labs today shows hemoglobin 10.6 with MCV 93.  Ferritin was 277 and percent saturation 28. - Recommend starting him on ESA with Aranesp 40 mcg every 4 weeks as needed if hemoglobin less than 10.  We discussed side effects in detail.  Today he will not need injection.  We will check his CBC every 4 weeks. - RTC 4 months for follow-up with repeat anemia panel and labs.  Will also check SPEP and free light chains at that time.   Orders Placed This Encounter  Procedures   CBC with Differential/Platelet    Standing Status:   Future    Standing Expiration Date:   04/07/2024    Order Specific Question:   Release to patient    Answer:   Immediate   Ferritin    Standing Status:   Future    Standing Expiration Date:   04/07/2024    Order Specific  Question:   Release to patient    Answer:   Immediate   Iron and TIBC    Standing Status:   Future    Standing Expiration Date:   04/07/2024    Order Specific Question:   Release to patient    Answer:   Immediate   Vitamin B12    Standing Status:   Future    Standing Expiration Date:   04/07/2024    Order Specific Question:   Release to patient    Answer:   Immediate   Folate    Standing Status:   Future    Standing Expiration Date:   04/07/2024    Order Specific Question:   Release to patient    Answer:   Immediate   Protein electrophoresis, serum    Standing Status:   Future    Standing Expiration Date:   04/07/2024    Order Specific Question:   Release to patient    Answer:   Immediate   Kappa/lambda light chains    Standing Status:   Future    Standing Expiration Date:   04/07/2024   CBC    Standing Status:   Future    Standing Expiration Date:   04/07/2024      Alben Deeds Teague,acting as a scribe for Doreatha Massed, MD.,have documented all relevant documentation on the behalf of Doreatha Massed, MD,as directed by  Doreatha Massed, MD while in  the presence of Doreatha Massed, MD.   I, Doreatha Massed MD, have reviewed the above documentation for accuracy and completeness, and I agree with the above.   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 male presenting to clinic today for evaluation of anemia at the request of Dr. Wolfgang Phoenix.  Today, he states that he is doing well overall. His appetite level is at 100%. His energy level is at 50%.  He has a normal appetite and denies any unexpected weight loss. He has CKD, CHF, DM, and sleep apnea. 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 He was found to have abnormal CBC from 02/18/23  with low RBC at 3.37, low HGB at 10.0, and low HCT at 29.8. He was found to have an abnormal Iron and TIBC panel on 02/25/23 with decreased TIBC at 215. Ferritin was elevated at 525 on 02/25/23. He received one Epogen injection and had IV iron on 10/28/22 and 11/11/22 He denies any issues with IV iron infusion. He received 1 unit of PRBC in March 2024. He denies any hematochezia or hematuria. He denies ice pica.   He c/o consistent tiredness that he attributes to not sleeping well. He is fairly active at [employer] and rides a stationary bike at the gym located there.   He underwent an EGD and colonoscopy on 03/11/23. Surgical pathology of the stomach biopsy revealed: mild reactive gastropathy and negative for H pylori, dysplasia, and malignancy. The stomach ulcer biopsy revealed: marked reactive gastropathy and negative for H pylori, metaplasia, dysplasia or malignancy. The polypectomy of the cecum of the ascending colon revealed: tubular adenoma, no high grade dysplasia or malignancy, and sessile serrated polyp.   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  STROKE  JULY  2017   Tinea unguium    Type 2 diabetes mellitus  (HCC)    Vitamin D deficiency     Surgical History: Past Surgical History:  Procedure Laterality Date   ABDOMINAL AORTOGRAM W/LOWER EXTREMITY N/A 11/08/2021   Procedure: ABDOMINAL AORTOGRAM W/LOWER EXTREMITY;  Surgeon: Cephus Shelling, MD;  [facility]: [facility];  Service: Cardiovascular;  Laterality: N/A;   ABDOMINAL AORTOGRAM W/LOWER EXTREMITY Right 11/07/2022  Procedure: ABDOMINAL AORTOGRAM W/LOWER EXTREMITY;  Surgeon: Cephus Shelling, MD;  Location: [facility] INVASIVE CV LAB;  Service: Cardiovascular;  Laterality: Right;   AMPUTATION Bilateral 2012   Left big toe partial and right big toe complete   BIOPSY  03/11/2023   Procedure: BIOPSY;  Surgeon: Marguerita Merles, Reuel Boom, MD;  [facility]: [facility];  Service: Gastroenterology;;  upper and lower bx's   CARDIOVASCULAR STRESS TEST  04-05-2014  dr croitoru   low risk lexiscan nuclear study with mild diaphragmatic attenuation artifact/  no ischemia/  ef 58%   CATARACT EXTRACTION W/ INTRAOCULAR LENS  IMPLANT, BILATERAL  2013   COLONOSCOPY WITH PROPOFOL N/A 03/11/2023   Procedure: COLONOSCOPY WITH PROPOFOL;  Surgeon: Dolores Frame, MD;  [facility]: [facility];  Service: Gastroenterology;  Laterality: N/A;  11:45 AM;ASA 3   CYSTO /  LEFT URETERAL STENT PLACEMENT  2012   CYSTOSCOPY W/ URETERAL STENT REMOVAL Left 07/07/2014   Procedure: CYSTO WITH LEFT PORTION STENT REMOVAL;  Surgeon: Anner Crete, MD;  Location: [facility];  Service: Urology;  Laterality: Left;   CYSTOSCOPY WITH URETEROSCOPY AND STENT PLACEMENT Left 07/07/2014   Procedure: CYSTOLITHALOPAXY URETEROSCOPY WITH STENT;  Surgeon: Anner Crete, MD;  Location: [facility];  Service: Urology;  Laterality: Left;   ENDARTERECTOMY Right 04/04/2016   Procedure: ENDARTERECTOMY CAROTID ARTERY RIGHT;  Surgeon: Chuck Hint, MD;  Location: [facility] OR;  Service: Vascular;  Laterality: Right;   ESOPHAGOGASTRODUODENOSCOPY (EGD) WITH PROPOFOL N/A  03/11/2023   Procedure: ESOPHAGOGASTRODUODENOSCOPY (EGD) WITH PROPOFOL;  Surgeon: Dolores Frame, MD;  [facility];  Service: Gastroenterology;  Laterality: N/A;  11:45 AM;ASA 3   HOLMIUM LASER APPLICATION N/A 07/07/2014   Procedure: HOLMIUM LASER APPLICATION;  Surgeon: Anner Crete, MD;  Location: [facility];  Service: Urology;  Laterality: N/A;   I & D  INFECTED SPIDER BITE UPPER BACK  06/ 2012   NEPHROLITHOTOMY Left 08/16/2014   Procedure: LEFT NEPHROLITHOTOMY PERCUTANEOUS;  Surgeon: Anner Crete, MD;  Location: [facility];  Service: Urology;  Laterality: Left;   NEPHROLITHOTOMY Left 08/18/2014   Procedure: LEFT NEPHROLITHOTOMY PERCUTANEOUS SECOND LOOK;  Surgeon: Anner Crete, MD;  Location: [facility];  Service: Urology;  Laterality: Left;   PATCH ANGIOPLASTY Right 04/04/2016   Procedure: PATCH ANGIOPLASTY RIGHT CAROTID ARTERY;  Surgeon: Chuck Hint, MD;  Location: [facility] OR;  Service: Vascular;  Laterality: Right;   PERIPHERAL VASCULAR BALLOON ANGIOPLASTY  11/08/2021   Procedure: PERIPHERAL VASCULAR BALLOON ANGIOPLASTY;  Surgeon: Cephus Shelling, MD;  [facility]: [facility];  Service: Cardiovascular;;  Left AT   PERIPHERAL VASCULAR BALLOON ANGIOPLASTY Right 11/07/2022   Procedure: PERIPHERAL VASCULAR BALLOON ANGIOPLASTY;  Surgeon: Cephus Shelling, MD;  [facility]: [facility];  Service: Cardiovascular;  Laterality: Right;  right TP trunk and PT   POLYPECTOMY  03/11/2023   Procedure: POLYPECTOMY;  Surgeon: Dolores Frame, MD;  [facility];  Service: Gastroenterology;;   RETINAL DETACHMENT SURGERY Bilateral 2013   TONSILLECTOMY AND ADENOIDECTOMY  as child    Social History: Social History   Socioeconomic History   Marital status: Single    Spouse name: Not on file   Number of children: 0   Years of education: Not on file   Highest education level: Not on file  Occupational History   Not on file  Tobacco Use    Smoking status: Never   Smokeless tobacco: Never  Vaping Use  Vaping status: Never Used  Substance and Sexual Activity   Alcohol use: Not Currently   Drug use: No   Sexual activity: Not on file  Other Topics Concern   Not on file  Social History Narrative   Lives with "wife"      Update 11/05/2022   Lives at [facility] rehab currently   Right handed   Caffeine: none    Social Determinants of Health   Financial Resource Strain: Low Risk  (09/05/2021)   Received from [facility], [facility]   Overall Financial Resource Strain (CARDIA)    Difficulty of Paying Living Expenses: Not hard at all  Food Insecurity: No Food Insecurity (05/13/2022)   Hunger Vital Sign    Worried About Running Out of Food in the Last Year: Never true    Ran Out of Food in the Last Year: Never true  Transportation Needs: No Transportation Needs (05/13/2022)   PRAPARE - Administrator, Civil Service (Medical): No    Lack of Transportation (Non-Medical): No  Physical Activity: Inactive (09/05/2021)   Received from [facility], [facility]   Exercise Vital Sign    Days of Exercise per Week: 0 days    Minutes of Exercise per Session: 0 min  Stress: No Stress Concern Present (09/05/2021)   Received from [facility], [facility] - Occupational Stress Questionnaire    Feeling of Stress : Not at all  Social Connections: Unknown (09/05/2021)   Received from [facility], [facility]   Social Connection and Isolation Panel [NHANES]    Frequency of Communication with Friends and Family: Once a week    Frequency of Social Gatherings with Friends and Family: Once a week    Attends Religious Services: 1 to 4 times per year    Active Member of [employer] or Organizations: No    Attends Engineer, structural: 1 to 4 times per year    Marital Status: Not on file  Intimate Partner Violence: Not At Risk (05/13/2022)   Humiliation,  Afraid, Rape, and Kick questionnaire    Fear of Current or Ex-Partner: No    Emotionally Abused: No    Physically Abused: No    Sexually Abused: No    Family History: Family History  Problem Relation Age of Onset   Emphysema Mother    Cancer Mother        Throat   Heart disease Mother    Stroke Neg Hx     Current Medications:  Current Outpatient Medications:    Ascorbic Acid (VITAMIN C PO), Take 500 mg by mouth 2 (two) times daily., Disp: , Rfl:    aspirin EC 81 MG tablet, Take 81 mg by mouth daily., Disp: , Rfl:    atorvastatin (LIPITOR) 40 MG tablet, Take 40 mg by mouth at bedtime., Disp: , Rfl:    Baclofen 5 MG TABS, Take 1 tablet (5 mg total) by mouth 3 (three) times daily as needed (muscle spasm). (Patient taking differently: Take 5 mg by mouth every 8 (eight) hours as needed (muscle spasm).), Disp: 90 tablet, Rfl:    busPIRone (BUSPAR) 5 MG tablet, Take 5 mg by mouth 2 (two) times daily., Disp: , Rfl:    cloNIDine (CATAPRES - DOSED IN MG/24 HR) 0.3 mg/24hr patch, Place 1 patch (0.3 mg total) onto the skin once a week. (Patient taking differently: Place 0.3 mg onto the skin  every Friday.), Disp: 4 patch, Rfl: 12   clopidogrel (PLAVIX) 75 MG tablet, Take 75 mg by mouth daily., Disp: , Rfl:    ferrous sulfate 325 (65 FE) MG tablet, Take 325 mg by mouth daily with breakfast., Disp: , Rfl:    furosemide (LASIX) 40 MG tablet, Take 1 tablet (40 mg total) by mouth daily., Disp: 30 tablet, Rfl: 1   insulin glargine (LANTUS) 100 UNIT/ML injection, Inject 0.2 mLs (20 Units total) into the skin at bedtime., Disp: 10 mL, Rfl: 11   insulin lispro (ADMELOG) 100 UNIT/ML injection, Inject 3 Units into the skin 3 (three) times daily before meals. Hold for blood sugar <200, Disp: , Rfl:    meclizine (ANTIVERT) 25 MG tablet, Take 25 mg by mouth every 8 (eight) hours., Disp: , Rfl:    Multiple Vitamin (MULTIVITAMIN) tablet, Take 1 tablet by mouth daily., Disp: , Rfl:    olmesartan (BENICAR) 5 MG  tablet, Take 5 mg by mouth daily., Disp: , Rfl:    pantoprazole (PROTONIX) 40 MG tablet, Take 1 tablet (40 mg total) by mouth daily., Disp: 90 tablet, Rfl: 3   traMADol (ULTRAM) 50 MG tablet, Take 1 tablet (50 mg total) by mouth 2 (two) times daily as needed for moderate pain., Disp: 10 tablet, Rfl: 0   labetalol (NORMODYNE) 200 MG tablet, Take 1 tablet (200 mg total) by mouth 2 (two) times daily., Disp: 60 tablet, Rfl: 0   mineral oil-hydrophilic petrolatum (AQUAPHOR) ointment, Apply 1 Application topically at bedtime., Disp: , Rfl:    OXYGEN, Inhale 2 L/min into the lungs as needed (to keep O2 saturation greater than 90%)., Disp: , Rfl:    sertraline (ZOLOFT) 25 MG tablet, Take 1 tablet (25 mg total) by mouth daily., Disp: 30 tablet, Rfl: 0   Allergies: No Known Allergies  REVIEW OF SYSTEMS:   Review of Systems  Constitutional:  Negative for chills, fatigue and fever.  HENT:   Negative for lump/mass, mouth sores, nosebleeds, sore throat and trouble swallowing.   Eyes:  Negative for eye problems.  Respiratory:  Negative for cough and shortness of breath.   Cardiovascular:  Negative for chest pain, leg swelling and palpitations.  Gastrointestinal:  Positive for constipation. Negative for abdominal pain, diarrhea, nausea and vomiting.  Genitourinary:  Negative for bladder incontinence, difficulty urinating, dysuria, frequency, hematuria and nocturia.   Musculoskeletal:  Negative for arthralgias, back pain, flank pain, myalgias and neck pain.  Skin:  Negative for itching and rash.  Neurological:  Positive for dizziness, headaches and numbness (legs).  Hematological:  Does not bruise/bleed easily.  Psychiatric/Behavioral:  Positive for sleep disturbance. Negative for depression and suicidal ideas. The patient is not nervous/anxious.   All other systems reviewed and are negative.    VITALS:   Blood pressure (!) 176/73, pulse 66, temperature (!) 97.4 F (36.3 C), temperature source Oral,  resp. rate 18, height 5' 10.5" (1.791 m), weight 244 lb 9.6 oz (110.9 kg), SpO2 100%.  Wt Readings from Last 3 Encounters:  04/08/23 244 lb 9.6 oz (110.9 kg)  03/11/23 236 lb 1.8 oz (107.1 kg)  02/14/23 236 lb 1.8 oz (107.1 kg)    Body mass index is 34.6 kg/m.   PHYSICAL EXAM:   Physical Exam Vitals and nursing note reviewed. Exam conducted with a chaperone present.  Constitutional:      Appearance: Normal appearance.  Cardiovascular:     Rate and Rhythm: Normal rate and regular rhythm.     Pulses: Normal pulses.  Heart sounds: Normal heart sounds.  Pulmonary:     Effort: Pulmonary effort is normal.     Breath sounds: Normal breath sounds.  Abdominal:     Palpations: Abdomen is soft. There is no hepatomegaly, splenomegaly or mass.     Tenderness: There is no abdominal tenderness.  Musculoskeletal:     Right lower leg: No edema.     Left lower leg: No edema.  Lymphadenopathy:     Cervical: No cervical adenopathy.     Right cervical: No superficial, deep or posterior cervical adenopathy.    Left cervical: No superficial, deep or posterior cervical adenopathy.     Upper Body:     Right upper body: No supraclavicular or axillary adenopathy.     Left upper body: No supraclavicular or axillary adenopathy.  Neurological:     General: No focal deficit present.     Mental Status: He is alert and oriented to person, place, and time.  Psychiatric:        Mood and Affect: Mood normal.        Behavior: Behavior normal.     LABS:      Latest Ref Rng & Units 04/08/2023   10:40 AM 02/24/2023    8:40 AM 02/14/2023    8:47 AM  CBC  WBC 4.0 - 10.5 K/uL 5.6   4.5   Hemoglobin 13.0 - 17.0 g/dL 16.1  09.6  04.5   Hematocrit 39.0 - 52.0 % 32.5   33.6   Platelets 150 - 400 K/uL 210   186       Latest Ref Rng & Units 02/14/2023    8:47 AM 11/27/2022    4:31 AM 11/26/2022    3:37 AM  CMP  Glucose 70 - 99 mg/dL 409  811  914   BUN 6 - 20 mg/dL 63  60  63   Creatinine 0.61 - 1.24  mg/dL 7.82  9.56  2.13   Sodium 135 - 145 mmol/L 136  139  138   Potassium 3.5 - 5.1 mmol/L 4.8  4.4  4.3   Chloride 98 - 111 mmol/L 105  107  104   CO2 22 - 32 mmol/L 22  24  24    Calcium 8.9 - 10.3 mg/dL 9.0  9.4  9.2   Total Protein 6.5 - 8.1 g/dL  7.6  7.9   Total Bilirubin 0.3 - 1.2 mg/dL  0.7  0.2   Alkaline Phos 38 - 126 U/L  93  107   AST 15 - 41 U/L  39  25   ALT 0 - 44 U/L  30  34      No results found for: "CEA1", "CEA" / No results found for: "CEA1", "CEA" No results found for: "PSA1" No results found for: "YQM578" No results found for: "CAN125"  No results found for: "TOTALPROTELP", "ALBUMINELP", "A1GS", "A2GS", "BETS", "BETA2SER", "GAMS", "MSPIKE", "SPEI" Lab Results  Component Value Date   TIBC 274 04/08/2023   TIBC 195 (L) 07/02/2022   FERRITIN 277 04/08/2023   FERRITIN 757 (H) 07/02/2022   IRONPCTSAT 28 04/08/2023   IRONPCTSAT 21 07/02/2022   No results found for: "LDH"   STUDIES:   No results found.

## 2023-04-08 NOTE — Progress Notes (Signed)
No injection needed today per MD.

## 2023-04-09 ENCOUNTER — Encounter: Payer: Self-pay | Admitting: Nephrology

## 2023-04-09 NOTE — Addendum Note (Signed)
Addended by: Kandra Nicolas on: 04/09/2023 01:38 PM   Modules accepted: Orders

## 2023-04-29 ENCOUNTER — Ambulatory Visit (INDEPENDENT_AMBULATORY_CARE_PROVIDER_SITE_OTHER): Payer: Medicare Other | Admitting: Gastroenterology

## 2023-05-05 ENCOUNTER — Inpatient Hospital Stay: Payer: Medicare Other

## 2023-05-05 DIAGNOSIS — D649 Anemia, unspecified: Secondary | ICD-10-CM

## 2023-05-05 DIAGNOSIS — I130 Hypertensive heart and chronic kidney disease with heart failure and stage 1 through stage 4 chronic kidney disease, or unspecified chronic kidney disease: Secondary | ICD-10-CM | POA: Diagnosis not present

## 2023-05-05 LAB — CBC
HCT: 30.1 % — ABNORMAL LOW (ref 39.0–52.0)
Hemoglobin: 10 g/dL — ABNORMAL LOW (ref 13.0–17.0)
MCH: 30.9 pg (ref 26.0–34.0)
MCHC: 33.2 g/dL (ref 30.0–36.0)
MCV: 92.9 fL (ref 80.0–100.0)
Platelets: 201 10*3/uL (ref 150–400)
RBC: 3.24 MIL/uL — ABNORMAL LOW (ref 4.22–5.81)
RDW: 12.8 % (ref 11.5–15.5)
WBC: 5.9 10*3/uL (ref 4.0–10.5)
nRBC: 0 % (ref 0.0–0.2)

## 2023-05-05 NOTE — Progress Notes (Signed)
Hgb 10 today and no injection needed per oncology note and pharmacy.

## 2023-05-13 ENCOUNTER — Ambulatory Visit: Payer: Medicare Other | Admitting: Adult Health

## 2023-05-30 ENCOUNTER — Other Ambulatory Visit: Payer: Self-pay

## 2023-05-30 DIAGNOSIS — D649 Anemia, unspecified: Secondary | ICD-10-CM

## 2023-05-30 NOTE — Progress Notes (Signed)
Lab orders entered

## 2023-06-02 ENCOUNTER — Inpatient Hospital Stay: Payer: Medicare Other | Attending: Physician Assistant

## 2023-06-02 ENCOUNTER — Inpatient Hospital Stay: Payer: Medicare Other

## 2023-06-02 VITALS — BP 115/45 | HR 65 | Temp 96.9°F | Resp 20

## 2023-06-02 DIAGNOSIS — I130 Hypertensive heart and chronic kidney disease with heart failure and stage 1 through stage 4 chronic kidney disease, or unspecified chronic kidney disease: Secondary | ICD-10-CM | POA: Insufficient documentation

## 2023-06-02 DIAGNOSIS — N182 Chronic kidney disease, stage 2 (mild): Secondary | ICD-10-CM | POA: Insufficient documentation

## 2023-06-02 DIAGNOSIS — D631 Anemia in chronic kidney disease: Secondary | ICD-10-CM | POA: Insufficient documentation

## 2023-06-02 DIAGNOSIS — D649 Anemia, unspecified: Secondary | ICD-10-CM

## 2023-06-02 LAB — CBC
HCT: 33.1 % — ABNORMAL LOW (ref 39.0–52.0)
Hemoglobin: 10.6 g/dL — ABNORMAL LOW (ref 13.0–17.0)
MCH: 30.1 pg (ref 26.0–34.0)
MCHC: 32 g/dL (ref 30.0–36.0)
MCV: 94 fL (ref 80.0–100.0)
Platelets: 170 10*3/uL (ref 150–400)
RBC: 3.52 MIL/uL — ABNORMAL LOW (ref 4.22–5.81)
RDW: 12.4 % (ref 11.5–15.5)
WBC: 5.7 10*3/uL (ref 4.0–10.5)
nRBC: 0 % (ref 0.0–0.2)

## 2023-06-02 MED ORDER — DARBEPOETIN ALFA 40 MCG/0.4ML IJ SOSY: 40.0000 ug | PREFILLED_SYRINGE | Freq: Once | INTRAMUSCULAR | Status: DC

## 2023-06-02 NOTE — Progress Notes (Signed)
Hold Aranesp injection today per MD orders.  Lab parameters were not met.  Hemoglobin today is 10.6.  Patient left ambulatory in stable condition.

## 2023-06-02 NOTE — Patient Instructions (Addendum)

## 2023-06-10 ENCOUNTER — Ambulatory Visit (INDEPENDENT_AMBULATORY_CARE_PROVIDER_SITE_OTHER): Payer: Medicare Other | Admitting: Gastroenterology

## 2023-06-10 ENCOUNTER — Encounter (INDEPENDENT_AMBULATORY_CARE_PROVIDER_SITE_OTHER): Payer: Self-pay | Admitting: Gastroenterology

## 2023-06-10 VITALS — BP 99/62 | HR 65 | Temp 97.5°F | Ht 70.5 in | Wt 250.7 lb

## 2023-06-10 DIAGNOSIS — K3189 Other diseases of stomach and duodenum: Secondary | ICD-10-CM | POA: Diagnosis not present

## 2023-06-10 DIAGNOSIS — I959 Hypotension, unspecified: Secondary | ICD-10-CM | POA: Diagnosis not present

## 2023-06-10 DIAGNOSIS — K259 Gastric ulcer, unspecified as acute or chronic, without hemorrhage or perforation: Secondary | ICD-10-CM | POA: Diagnosis not present

## 2023-06-10 DIAGNOSIS — D509 Iron deficiency anemia, unspecified: Secondary | ICD-10-CM | POA: Diagnosis not present

## 2023-06-10 NOTE — Patient Instructions (Signed)
-  Continue PO iron daily  -Continue to follow with hematology  -Continue protonix 40mg  daily  -Please avoid NSAIDs (advil, aleve, naproxen, goody powder, ibuprofen) as these can be very hard on your GI tract, causing inflammation, ulcers and damage to the lining of your GI tract.  -please follow up with attending or PCP regarding your low blood pressure  Follow up 1 year

## 2023-06-10 NOTE — Progress Notes (Signed)
Referring Provider: Lindaann Pascal Primary Care Physician:  Lindaann Pascal Primary GI Physician: Dr. Levon Hedger   Chief Complaint  Patient presents with   Follow-up    Patient here today for a follow up. Patient says he did have an episode of diarrhea on Saturday, this has since subsided. Patient bp today is low and says it has been.He does have some dizziness.    HPI:   Oscar Rose is a 62 y.o. male with past medical history of  ARDS, CKD, depression, neuropathy, GERD, HLD, HTN, legal blindness of L eye, PVD, RLS, sleep apnea, stroke, Type 2 DM, recent TP trunk and PT angioplasty (April 2024)   Patient presenting today for follow up of IDA  Last seen may 2024, at that time presenting for anemia present since April 2023, hemoglobin mostly ranged from 7-10 with most recent hemoglobin at that time 10.6.  Iron studies in January 2024 with TIBC 194, iron 21, iron saturation 11, ferritin 736.  Patient receiving Epogen every 14 days and had received 2 iron infusions in March.  Also taking iron pills daily.  He denied symptoms of IDA.  Having a BM every 2 to 3 days.  No rectal bleeding or melena.  Patient recommended to continue with p.o. iron daily, schedule EGD and colonoscopy.  Most recent labs in October with hgb 10.6, iron studies in September with ferritin 277, iron 77, TIBC 274, sat 28  Seen by hematology in september and started on ESA with Aranesp every 4 weeks.   Present: States he is doing okay, seeing hematology. Notes his BP has been low the past week, BP medication was lowered a few months ago, he is having some dizziness now from hypotension. He denies SOB. He is not feeling well since BP has been low. Denies rectal bleeding. Has darker stools sometimes on PO iron. Appetite is  up and down as he notes food at his facility is not great. Weight is stable. No nausea or vomiting. He had one episode of diarrhea on Saturday but no further episodes. He has no GI complaints today.   Last  Colonoscopy:03/2023- One 7 mm polyp in the cecum, removed with a cold                            snare. Resected and retrieved.                           - Two 1 to 2 mm polyps in the ascending colon,                            removed with a cold biopsy forceps. Resected and                            retrieved.                           - Non-bleeding internal hemorrhoids.  Last Endoscopy: 03/2023- Normal esophagus.                           - Erosive gastropathy with no stigmata of recent  bleeding. Biopsied.                           - Non-bleeding gastric ulcer with a clean ulcer                            base (Forrest Class III). Biopsied.                           - Erythematous duodenopathy. Marland Kitchen STOMACH, BIOPSY:  - Mild reactive gastropathy.  - Negative for H. pylori on HE stain  - No intestinal metaplasia, dysplasia, or malignancy.   B. STOMACH ULCER, BIOPSY:  - Marked reactive gastropathy  - Negative for H. pylori on HE and immunohistochemical stain  - Negative for test metaplasia, dysplasia or malignancy   C. COLON, CECUM, ASCENDING, POLYPECTOMY:  - Tubular adenoma.  - No high grade dysplasia or malignancy.  - Sessile serrated polyp.  - No dysplasia or malignancy.   Recommendations:  Repeat TCS 5 years   Past Medical History:  Diagnosis Date   Anemia    ARDS (adult respiratory distress syndrome) (HCC)    Arthritis    At risk for sleep apnea    STOP-BANG= 7    SENT TO PCP 06-29-2014   CKD (chronic kidney disease), stage II    montitored by nephrologist   Depression        Diabetic neuropathy (HCC)    GERD (gastroesophageal reflux disease)    Headache    SINUS   History of kidney stones    History of retinal detachment    Hyperlipidemia    Hypertension    Legal blindness of left eye, as defined in [place]    SECONDARY TO RETAINAL DETACHMENT   Neuropathy    PVD (peripheral vascular disease) (HCC)    Restless leg syndrome     Retained ureteral stent    w/ encrustation SINCE 2012   Rotator cuff syndrome of right shoulder    Sleep apnea in adult    Stroke [facility])    ?  STROKE  JULY  2017   Tinea unguium    Type 2 diabetes mellitus (HCC)    Vitamin D deficiency     Past Surgical History:  Procedure Laterality Date   ABDOMINAL AORTOGRAM W/LOWER EXTREMITY N/A 11/08/2021   Procedure: ABDOMINAL AORTOGRAM W/LOWER EXTREMITY;  Surgeon: Cephus Shelling, MD;  [facility]: [facility];  Service: Cardiovascular;  Laterality: N/A;   ABDOMINAL AORTOGRAM W/LOWER EXTREMITY Right 11/07/2022   Procedure: ABDOMINAL AORTOGRAM W/LOWER EXTREMITY;  Surgeon: Cephus Shelling, MD;  Location: [facility];  Service: Cardiovascular;  Laterality: Right;   AMPUTATION Bilateral 2012   Left big toe partial and right big toe complete   BIOPSY  03/11/2023   Procedure: BIOPSY;  Surgeon: Marguerita Merles, Reuel Boom, MD;  Location: [facility];  Service: Gastroenterology;;  upper and lower bx's   CARDIOVASCULAR STRESS TEST  04-05-2014  dr croitoru   low risk lexiscan nuclear study with mild diaphragmatic attenuation artifact/  no ischemia/  ef 58%   CATARACT EXTRACTION W/ INTRAOCULAR LENS  IMPLANT, BILATERAL  2013   COLONOSCOPY WITH PROPOFOL N/A 03/11/2023   Procedure: COLONOSCOPY WITH PROPOFOL;  Surgeon: Dolores Frame, MD;  [facility];  Service: Gastroenterology;  Laterality: N/A;  11:45 AM;ASA 3   CYSTO /  LEFT URETERAL STENT PLACEMENT  2012   CYSTOSCOPY W/ URETERAL STENT REMOVAL Left 07/07/2014   Procedure: CYSTO WITH LEFT PORTION STENT REMOVAL;  Surgeon: Anner Crete, MD;  Location: [facility];  Service: Urology;  Laterality: Left;   CYSTOSCOPY WITH URETEROSCOPY AND STENT PLACEMENT Left 07/07/2014   Procedure: CYSTOLITHALOPAXY URETEROSCOPY WITH STENT;  Surgeon: Anner Crete, MD;  Location: [facility];  Service: Urology;  Laterality: Left;   ENDARTERECTOMY Right 04/04/2016    Procedure: ENDARTERECTOMY CAROTID ARTERY RIGHT;  Surgeon: Chuck Hint, MD;  Location: [facility];  Service: Vascular;  Laterality: Right;   ESOPHAGOGASTRODUODENOSCOPY (EGD) WITH PROPOFOL N/A 03/11/2023   Procedure: ESOPHAGOGASTRODUODENOSCOPY (EGD) WITH PROPOFOL;  Surgeon: Dolores Frame, MD;  [facility];  Service: Gastroenterology;  Laterality: N/A;  11:45 AM;ASA 3   HOLMIUM LASER APPLICATION N/A 07/07/2014   Procedure: HOLMIUM LASER APPLICATION;  Surgeon: Anner Crete, MD;  Location: [facility];  Service: Urology;  Laterality: N/A;   I & D  INFECTED SPIDER BITE UPPER BACK  06/ 2012   NEPHROLITHOTOMY Left 08/16/2014   Procedure: LEFT NEPHROLITHOTOMY PERCUTANEOUS;  Surgeon: Anner Crete, MD;  Location: [facility];  Service: Urology;  Laterality: Left;   NEPHROLITHOTOMY Left 08/18/2014   Procedure: LEFT NEPHROLITHOTOMY PERCUTANEOUS SECOND LOOK;  Surgeon: Anner Crete, MD;  Location: [facility];  Service: Urology;  Laterality: Left;   PATCH ANGIOPLASTY Right 04/04/2016   Procedure: PATCH ANGIOPLASTY RIGHT CAROTID ARTERY;  Surgeon: Chuck Hint, MD;  Location: [facility];  Service: Vascular;  Laterality: Right;   PERIPHERAL VASCULAR BALLOON ANGIOPLASTY  11/08/2021   Procedure: PERIPHERAL VASCULAR BALLOON ANGIOPLASTY;  Surgeon: Cephus Shelling, MD;  [facility];  Service: Cardiovascular;;  Left AT   PERIPHERAL VASCULAR BALLOON ANGIOPLASTY Right 11/07/2022   Procedure: PERIPHERAL VASCULAR BALLOON ANGIOPLASTY;  Surgeon: Cephus Shelling, MD;  [facility];  Service: Cardiovascular;  Laterality: Right;  right TP trunk and PT   POLYPECTOMY  03/11/2023   Procedure: POLYPECTOMY;  Surgeon: Marguerita Merles, Reuel Boom, MD;  [facility];  Service: Gastroenterology;;   RETINAL DETACHMENT SURGERY Bilateral 2013   TONSILLECTOMY AND ADENOIDECTOMY  as child    Current Outpatient Medications  Medication Sig Dispense Refill    acetaminophen (TYLENOL) 650 MG CR tablet Take 650 mg by mouth every 4 (four) hours as needed for pain.     Ascorbic Acid (VITAMIN C PO) Take 500 mg by mouth 2 (two) times daily.     aspirin EC 81 MG tablet Take 81 mg by mouth daily.     atorvastatin (LIPITOR) 40 MG tablet Take 40 mg by mouth at bedtime.     Baclofen 5 MG TABS Take 1 tablet (5 mg total) by mouth 3 (three) times daily as needed (muscle spasm). (Patient taking differently: Take 5 mg by mouth every 8 (eight) hours as needed (muscle spasm).) 90 tablet    busPIRone (BUSPAR) 5 MG tablet Take 5 mg by mouth 2 (two) times daily.     cloNIDine (CATAPRES - DOSED IN MG/24 HR) 0.3 mg/24hr patch Place 1 patch (0.3 mg total) onto the skin once a week. (Patient taking differently: Place 0.3 mg onto the skin every Friday.) 4 patch 12   clopidogrel (PLAVIX) 75 MG tablet Take 75 mg by mouth daily.     ferrous sulfate 325 (65 FE) MG tablet Take 325 mg by mouth daily with breakfast.     furosemide (LASIX) 40 MG tablet  Take 1 tablet (40 mg total) by mouth daily. 30 tablet 1   GLUCAGON, RDNA, IJ Inject as directed. Prn  Hypoglycemia     guaiFENesin (ROBITUSSIN) 100 MG/5ML liquid Take 5 mLs by mouth every 4 (four) hours as needed for cough or to loosen phlegm.     insulin glargine (SEMGLEE) 100 UNIT/ML injection Inject 20 Units into the skin at bedtime. Prn hold for CBG less than 120. Notify MD if CBG greater that 450     labetalol (NORMODYNE) 200 MG tablet Take 1 tablet (200 mg total) by mouth 2 (two) times daily. 60 tablet 0   loperamide (IMODIUM A-D) 2 MG tablet Take 2 mg by mouth 4 (four) times daily as needed for diarrhea or loose stools. 2 caplets initially, then 1 caplet following each loose stool.Do not exceed 4 caplets in a 24 hour period.     meclizine (ANTIVERT) 25 MG tablet Take 25 mg by mouth every 8 (eight) hours.     mineral oil-hydrophilic petrolatum (AQUAPHOR) ointment Apply 1 Application topically at bedtime.     Multiple Vitamin  (MULTIVITAMIN) tablet Take 1 tablet by mouth daily.     olmesartan (BENICAR) 5 MG tablet Take 5 mg by mouth daily.     ondansetron (ZOFRAN-ODT) 4 MG disintegrating tablet Take 4 mg by mouth every 4 (four) hours as needed for nausea or vomiting. If not resolved after two doses then Phenergan 25 mg Q 6 hours prn.     OVER THE COUNTER MEDICATION Fleets enema x 1, if no results from above call the MD  Mylanta 30 cc po Q 2 hours prn acid indigestion. Do not exceed 4 doses in 24 hours.  Pro stat 64 Oral liquid 30 ml in the am's for wound healing.     pantoprazole (PROTONIX) 40 MG tablet Take 1 tablet (40 mg total) by mouth daily. 90 tablet 3   sertraline (ZOLOFT) 25 MG tablet Take 1 tablet (25 mg total) by mouth daily. 30 tablet 0   sodium zirconium cyclosilicate (LOKELMA) 10 g PACK packet Take 10 g by mouth. One every morning on M,W,F for Hyperkalemia.     traMADol (ULTRAM) 50 MG tablet Take 1 tablet (50 mg total) by mouth 2 (two) times daily as needed for moderate pain. 10 tablet 0   No current facility-administered medications for this visit.    Allergies as of 06/10/2023   (No Known Allergies)    Family History  Problem Relation Age of Onset   Emphysema Mother    Cancer Mother        Throat   Heart disease Mother    Stroke Neg Hx     Social History   Socioeconomic History   Marital status: Single    Spouse name: Not on file   Number of children: 0   Years of education: Not on file   Highest education level: Not on file  Occupational History   Not on file  Tobacco Use   Smoking status: Never   Smokeless tobacco: Never  Vaping Use   Vaping status: Never Used  Substance and Sexual Activity   Alcohol use: Not Currently   Drug use: No   Sexual activity: Not on file  Other Topics Concern   Not on file  Social History Narrative   Lives with "wife"      Update 11/05/2022   Lives at [facility] re[facility] currently   Right handed   Caffeine: none    Social Determinants of  Health   Financial Resource Strain: Low Risk  (09/05/2021)   Received from [facility], [facility]   Overall Financial Resource Strain (CARDIA)    Difficulty of Paying Living Expenses: Not hard at all  Food Insecurity: No Food Insecurity (05/13/2022)   Hunger Vital Sign    Worried About Running Out of Food in the Last Year: Never true    Ran Out of Food in the Last Year: Never true  Transportation Needs: No Transportation Needs (05/13/2022)   PRAPARE - Administrator, Civil Service (Medical): No    Lack of Transportation (Non-Medical): No  Physical Activity: Inactive (09/05/2021)   [facility], [facility]   Exercise Vital Sign    Days of Exercise per Week: 0 days    Minutes of Exercise per Session: 0 min  Stress: No Stress Concern Present (09/05/2021)   Received from [facility], [facility] of Occupational Health - Occupational Stress Questionnaire    Feeling of Stress : Not at all  Social Connections: Unknown (09/05/2021)   Received from [facility], [facility]   Social Connection and Isolation Panel [NHANES]    Frequency of Communication with Friends and Family: Once a week    Frequency of Social Gatherings with Friends and Family: Once a week    Attends Religious Services: 1 to 4 times per year    Active Member of Clubs or Organizations: No    Attends Engineer, structural: 1 to 4 times per year    Marital Status: Not on file    Review of systems General: negative for malaise, night sweats, fever, chills, weight loss Neck: Negative for lumps, goiter, pain and significant neck swelling Resp: Negative for cough, wheezing, dyspnea at rest CV: Negative for chest pain, leg swelling, palpitations, orthopnea GI: denies melena, hematochezia, nausea, vomiting, diarrhea, constipation, dysphagia, odyonophagia, early satiety or unintentional weight loss.  MSK: Negative for joint pain or swelling, back pain,  and muscle pain. Derm: Negative for itching or rash Psych: Denies depression, anxiety, memory loss, confusion. No homicidal or suicidal ideation.  Heme: Negative for prolonged bleeding, bruising easily, and swollen nodes. Endocrine: Negative for cold or heat intolerance, polyuria, polydipsia and goiter. Neuro: negative for tremor, gait imbalance, syncope and seizures. The remainder of the review of systems is noncontributory.  Physical Exam: BP 99/62 (BP Location: Right Arm, Patient Position: Sitting, Cuff Size: Large)   Pulse 65   Temp (!) 97.5 F (36.4 C) (Temporal)   Ht 5' 10.5" (1.791 m)   Wt 250 lb 11.2 oz (113.7 kg)   BMI 35.46 kg/m  General:   Alert and oriented. No distress noted. Pleasant and cooperative.  Head:  Normocephalic and atraumatic. Eyes:  Conjuctiva clear without scleral icterus. Mouth:  Oral mucosa pink and moist. Good dentition. No lesions. Heart: Normal rate and rhythm, s1 and s2 heart sounds present.  Lungs: Clear lung sounds in all lobes. Respirations equal and unlabored. Abdomen:  +BS, soft, non-tender and non-distended. No rebound or guarding. No HSM or masses noted. Derm: No palmar erythema or jaundice Msk:  Symmetrical without gross deformities. Normal posture. Extremities:  Without edema. Neurologic:  Alert and  oriented x4 Psych:  Alert and cooperative. Normal mood and affect.  Invalid input(s): "6 MONTHS"   ASSESSMENT: Oscar Rose is a 62 y.o. male presenting today for follow up of IDA  Most recent iron studies within normal limits,  hemoglobin stable at 10.6.  He is following with hematology and receiving Aranesp as well as taking daily iron pill.  He denies rectal bleeding, has dark stools on p.o. iron.  Recent colonoscopy and EGD with findings of gastric ulcer/reactive gastropathy as well as a few polyps and hemorrhoids.  He is maintained on pantoprazole 40 mg once daily.  He is due for repeat upper endoscopy given findings of gastric ulcer in  August.  For now we will continue with pantoprazole 40 mg once daily, he should continue to avoid all NSAIDs.  Notably BP low in the office today BP repeated was 99/62.  He reports some ongoing issues with hypotension recently.  He is planning to discuss BP changes with staff at his facility to either see attending/or PCP regarding hypertension.  He is alert and oriented and steady on his feet with ambulation in the office today.   PLAN:  Continue PO iron daily  2. Continue to follow with hematology  3. Continue protonix 40mg  daily  4. Avoid NSAIDs  5. Follow up with attending or PCP regarding low BP 6. Repeat EGD ASA III- hold plavix x5 days  All questions were answered, patient verbalized understanding and is in agreement with plan as outlined above.   Follow Up: 1 year   Clevie Prout L. Jeanmarie Hubert, MSN, APRN, AGNP-C Adult-Gerontology Nurse Practitioner [facility]  I have reviewed the note and agree with the APP's assessment as described in this progress note  Katrinka Blazing, MD Gastroenterology and Hepatology [facility] Gastroenterology

## 2023-06-11 NOTE — Progress Notes (Signed)
06/11/23 left message on [employer] at [facility]

## 2023-06-16 ENCOUNTER — Telehealth (INDEPENDENT_AMBULATORY_CARE_PROVIDER_SITE_OTHER): Payer: Self-pay | Admitting: Gastroenterology

## 2023-06-16 NOTE — Telephone Encounter (Signed)
06/16/23-spoke with Massie Bougie at [facility]. Pt is scheduled for 07/31/23 as that is the only day that [name] could accompany. Offered first available in November and early December but [place] schedule is booked.  Instructions will be faxed to [facility] once pre op date is received.

## 2023-06-16 NOTE — Progress Notes (Signed)
06/16/23-spoke with Massie Bougie at [facility]. Pt is scheduled for 07/31/23 as that is the only day that [name] could accompany. Offered first available in November and early December but [place] schedule is booked.  Instructions will be faxed to [facility] once pre op date is received.

## 2023-06-17 ENCOUNTER — Ambulatory Visit (HOSPITAL_COMMUNITY)
Admission: RE | Admit: 2023-06-17 | Discharge: 2023-06-17 | Disposition: A | Discharge status: Home / Self Care | Payer: Medicare Other | Source: Ambulatory Visit | Attending: Vascular Surgery | Admitting: Vascular Surgery

## 2023-06-17 ENCOUNTER — Encounter: Payer: Self-pay | Admitting: Vascular Surgery

## 2023-06-17 ENCOUNTER — Ambulatory Visit (INDEPENDENT_AMBULATORY_CARE_PROVIDER_SITE_OTHER)
Admission: RE | Admit: 2023-06-17 | Discharge: 2023-06-17 | Disposition: A | Discharge status: Home / Self Care | Payer: Medicare Other | Source: Ambulatory Visit | Attending: Vascular Surgery

## 2023-06-17 ENCOUNTER — Ambulatory Visit (INDEPENDENT_AMBULATORY_CARE_PROVIDER_SITE_OTHER): Payer: Medicare Other | Admitting: Vascular Surgery

## 2023-06-17 VITALS — BP 113/70 | HR 64 | Temp 98.1°F | Resp 18 | Ht 70.5 in | Wt 254.0 lb

## 2023-06-17 DIAGNOSIS — I70223 Atherosclerosis of native arteries of extremities with rest pain, bilateral legs: Secondary | ICD-10-CM | POA: Diagnosis present

## 2023-06-17 DIAGNOSIS — I739 Peripheral vascular disease, unspecified: Secondary | ICD-10-CM | POA: Diagnosis present

## 2023-06-17 LAB — VAS US ABI WITH/WO TBI
Left ABI: 0.87
Right ABI: 0.85

## 2023-06-17 NOTE — Progress Notes (Signed)
Patient name: Oscar Rose MRN: [id number] DOB: [birth year] Sex: male  REASON FOR CONSULT: 6 month follow-up PAD  HPI: Oscar Rose is a 62 y.o. male, with multiple risk factors including CKD, HTN, HLD, PVD, DM that presents for 6 month follow-up of his PAD.  Most recently on 11/07/2022 he underwent right TP trunk and PT angioplasty for second toe ulcer.  His tibial trifurcation was occluded and we were able to get the posterior tibial open which was his dominant runoff into the foot.  He has since undergone right second toe amputation that healed.  He is taking aspirin statin Plavix.   Currently at [facility].  In the past he has also undergone left AT angioplasty on 11/08/2021.  No concerns today.  States no pain in his legs even when walking.  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  STROKE  JULY  2017   Tinea unguium    Type 2 diabetes mellitus (HCC)    Vitamin D deficiency     Past Surgical History:  Procedure Laterality Date   ABDOMINAL AORTOGRAM W/LOWER EXTREMITY N/A 11/08/2021   Procedure: ABDOMINAL AORTOGRAM W/LOWER EXTREMITY;  Surgeon: Cephus Shelling, MD;  [facility];  Service: Cardiovascular;  Laterality: N/A;    ABDOMINAL AORTOGRAM W/LOWER EXTREMITY Right 11/07/2022   Procedure: ABDOMINAL AORTOGRAM W/LOWER EXTREMITY;  Surgeon: Cephus Shelling, MD;  Location: [facility];  Service: Cardiovascular;  Laterality: Right;   AMPUTATION Bilateral 2012   Left big toe partial and right big toe complete   BIOPSY  03/11/2023   Procedure: BIOPSY;  Surgeon: Marguerita Merles, Reuel Boom, MD;  Location: [facility];  Service: Gastroenterology;;  upper and lower bx's   CARDIOVASCULAR STRESS TEST  04-05-2014  dr croitoru   low risk lexiscan nuclear study with mild diaphragmatic attenuation artifact/  no ischemia/  ef 58%   CATARACT EXTRACTION W/ INTRAOCULAR LENS  IMPLANT, BILATERAL  2013   COLONOSCOPY WITH PROPOFOL N/A 03/11/2023   Procedure: COLONOSCOPY WITH PROPOFOL;  Surgeon: Dolores Frame, MD;  [facility];  Service: Gastroenterology;  Laterality: N/A;  11:45 AM;ASA 3   CYSTO /  LEFT URETERAL STENT PLACEMENT  2012   CYSTOSCOPY W/ URETERAL STENT REMOVAL Left 07/07/2014   Procedure: CYSTO WITH LEFT PORTION STENT REMOVAL;  Surgeon: Anner Crete, MD;  Location: [facility];  Service: Urology;  Laterality: Left;   CYSTOSCOPY WITH URETEROSCOPY AND STENT PLACEMENT Left 07/07/2014  Procedure: CYSTOLITHALOPAXY URETEROSCOPY WITH STENT;  Surgeon: Anner Crete, MD;  [facility];  Service: Urology;  Laterality: Left;   ENDARTERECTOMY Right 04/04/2016   Procedure: ENDARTERECTOMY CAROTID ARTERY RIGHT;  Surgeon: Chuck Hint, MD;  Location: [facility] OR;  Service: Vascular;  Laterality: Right;   ESOPHAGOGASTRODUODENOSCOPY (EGD) WITH PROPOFOL N/A 03/11/2023   Procedure: ESOPHAGOGASTRODUODENOSCOPY (EGD) WITH PROPOFOL;  Surgeon: Dolores Frame, MD;  [facility];  Service: Gastroenterology;  Laterality: N/A;  11:45 AM;ASA 3   HOLMIUM LASER APPLICATION N/A 07/07/2014   Procedure: HOLMIUM LASER APPLICATION;  Surgeon: Anner Crete, MD;  Location:  [facility];  Service: Urology;  Laterality: N/A;   I & D  INFECTED SPIDER BITE UPPER BACK  06/ 2012   NEPHROLITHOTOMY Left 08/16/2014   Procedure: LEFT NEPHROLITHOTOMY PERCUTANEOUS;  Surgeon: Anner Crete, MD;  Location: [facility];  Service: Urology;  Laterality: Left;   NEPHROLITHOTOMY Left 08/18/2014   Procedure: LEFT NEPHROLITHOTOMY PERCUTANEOUS SECOND LOOK;  Surgeon: Anner Crete, MD;  Location: [facility];  Service: Urology;  Laterality: Left;   PATCH ANGIOPLASTY Right 04/04/2016   Procedure: PATCH ANGIOPLASTY RIGHT CAROTID ARTERY;  Surgeon: Chuck Hint, MD;  Location: [facility] OR;  Service: Vascular;  Laterality: Right;   PERIPHERAL VASCULAR BALLOON ANGIOPLASTY  11/08/2021   Procedure: PERIPHERAL VASCULAR BALLOON ANGIOPLASTY;  Surgeon: Cephus Shelling, MD;  [facility];  Service: Cardiovascular;;  Left AT   PERIPHERAL VASCULAR BALLOON ANGIOPLASTY Right 11/07/2022   Procedure: PERIPHERAL VASCULAR BALLOON ANGIOPLASTY;  Surgeon: Cephus Shelling, MD;  [facility];  Service: Cardiovascular;  Laterality: Right;  right TP trunk and PT   POLYPECTOMY  03/11/2023   Procedure: POLYPECTOMY;  Surgeon: Dolores Frame, MD;  [facility];  Service: Gastroenterology;;   RETINAL DETACHMENT SURGERY Bilateral 2013   TONSILLECTOMY AND ADENOIDECTOMY  as child    Family History  Problem Relation Age of Onset   Emphysema Mother    Cancer Mother        Throat   Heart disease Mother    Stroke Neg Hx     SOCIAL HISTORY: Social History   Socioeconomic History   Marital status: Single    Spouse name: Not on file   Number of children: 0   Years of education: Not on file   Highest education level: Not on file  Occupational History   Not on file  Tobacco Use   Smoking status: Never   Smokeless tobacco: Never  Vaping Use   Vaping status: Never Used  Substance and Sexual Activity   Alcohol use: Not Currently   Drug use: No    Sexual activity: Not on file  Other Topics Concern   Not on file  Social History Narrative   Lives with "wife"      Update 11/05/2022   Lives at [facility] rehab currently   Right handed   Caffeine: none    Social Determinants of Health   Financial Resource Strain: Low Risk  (09/05/2021)   Received from [facility], [facility]   Overall Financial Resource Strain (CARDIA)    Difficulty of Paying Living Expenses: Not hard at all  Food Insecurity: No Food Insecurity (05/13/2022)   Hunger Vital Sign    Worried About Running Out of Food in the Last Year: Never true    Ran Out of Food in the Last Year: Never true  Transportation Needs: No Transportation  Needs (05/13/2022)   PRAPARE - Administrator, Civil Service (Medical): No    Lack of Transportation (Non-Medical): No  Physical Activity: Inactive (09/05/2021)   Received from [facility], [facility]   Exercise Vital Sign    Days of Exercise per Week: 0 days    Minutes of Exercise per Session: 0 min  Stress: No Stress Concern Present (09/05/2021)   Received from [facility], [facility] - Occupational Stress Questionnaire    Feeling of Stress : Not at all  Social Connections: Unknown (09/05/2021)   Received from [facility], [facility]   Social Connection and Isolation Panel [NHANES]    Frequency of Communication with Friends and Family: Once a week    Frequency of Social Gatherings with Friends and Family: Once a week    Attends Religious Services: 1 to 4 times per year    Active Member of [employer] or Organizations: No    Attends Engineer, structural: 1 to 4 times per year    Marital Status: Not on file  Intimate Partner Violence: Not At Risk (05/13/2022)   Humiliation, Afraid, Rape, and Kick questionnaire    Fear of Current or Ex-Partner: No    Emotionally Abused: No    Physically Abused: No    Sexually Abused: No    No Known  Allergies  Current Outpatient Medications  Medication Sig Dispense Refill   acetaminophen (TYLENOL) 650 MG CR tablet Take 650 mg by mouth every 4 (four) hours as needed for pain.     Ascorbic Acid (VITAMIN C PO) Take 500 mg by mouth 2 (two) times daily.     aspirin EC 81 MG tablet Take 81 mg by mouth daily.     atorvastatin (LIPITOR) 40 MG tablet Take 40 mg by mouth at bedtime.     Baclofen 5 MG TABS Take 1 tablet (5 mg total) by mouth 3 (three) times daily as needed (muscle spasm). (Patient taking differently: Take 5 mg by mouth every 8 (eight) hours as needed (muscle spasm).) 90 tablet    busPIRone (BUSPAR) 5 MG tablet Take 5 mg by mouth 2 (two) times daily.     cloNIDine (CATAPRES - DOSED IN MG/24 HR) 0.3 mg/24hr patch Place 1 patch (0.3 mg total) onto the skin once a week. (Patient taking differently: Place 0.3 mg onto the skin every Friday.) 4 patch 12   clopidogrel (PLAVIX) 75 MG tablet Take 75 mg by mouth daily.     ferrous sulfate 325 (65 FE) MG tablet Take 325 mg by mouth daily with breakfast.     furosemide (LASIX) 40 MG tablet Take 1 tablet (40 mg total) by mouth daily. 30 tablet 1   GLUCAGON, RDNA, IJ Inject as directed. Prn  Hypoglycemia     guaiFENesin (ROBITUSSIN) 100 MG/5ML liquid Take 5 mLs by mouth every 4 (four) hours as needed for cough or to loosen phlegm.     insulin glargine (SEMGLEE) 100 UNIT/ML injection Inject 20 Units into the skin at bedtime. Prn hold for CBG less than 120. Notify MD if CBG greater that 450     labetalol (NORMODYNE) 200 MG tablet Take 1 tablet (200 mg total) by mouth 2 (two) times daily. 60 tablet 0   loperamide (IMODIUM A-D) 2 MG tablet Take 2 mg by mouth 4 (four) times daily as needed for diarrhea or loose stools. 2 caplets initially, then 1 caplet following  each loose stool.Do not exceed 4 caplets in a 24 hour period.     meclizine (ANTIVERT) 25 MG tablet Take 25 mg by mouth every 8 (eight) hours.     mineral oil-hydrophilic petrolatum (AQUAPHOR)  ointment Apply 1 Application topically at bedtime.     Multiple Vitamin (MULTIVITAMIN) tablet Take 1 tablet by mouth daily.     olmesartan (BENICAR) 5 MG tablet Take 5 mg by mouth daily.     ondansetron (ZOFRAN-ODT) 4 MG disintegrating tablet Take 4 mg by mouth every 4 (four) hours as needed for nausea or vomiting. If not resolved after two doses then Phenergan 25 mg Q 6 hours prn.     OVER THE COUNTER MEDICATION Fleets enema x 1, if no results from above call the MD  Mylanta 30 cc po Q 2 hours prn acid indigestion. Do not exceed 4 doses in 24 hours.  Pro stat 64 Oral liquid 30 ml in the am's for wound healing.     pantoprazole (PROTONIX) 40 MG tablet Take 1 tablet (40 mg total) by mouth daily. 90 tablet 3   sertraline (ZOLOFT) 25 MG tablet Take 1 tablet (25 mg total) by mouth daily. 30 tablet 0   sodium zirconium cyclosilicate (LOKELMA) 10 g PACK packet Take 10 g by mouth. One every morning on M,W,F for Hyperkalemia.     traMADol (ULTRAM) 50 MG tablet Take 1 tablet (50 mg total) by mouth 2 (two) times daily as needed for moderate pain. 10 tablet 0   No current facility-administered medications for this visit.    REVIEW OF SYSTEMS:  [X]  denotes positive finding, [ ]  denotes negative finding Cardiac  Comments:  Chest pain or chest pressure:    Shortness of breath upon exertion:    Short of breath when lying flat:    Irregular heart rhythm:        Vascular    Pain in calf, thigh, or hip brought on by ambulation:    Pain in feet at night that wakes you up from your sleep:     Blood clot in your veins:    Leg swelling:         Pulmonary    Oxygen at home:    Productive cough:     Wheezing:         Neurologic    Sudden weakness in arms or legs:     Sudden numbness in arms or legs:     Sudden onset of difficulty speaking or slurred speech:    Temporary loss of vision in one eye:     Problems with dizziness:         Gastrointestinal    Blood in stool:     Vomited blood:          Genitourinary    Burning when urinating:     Blood in urine:        Psychiatric    Major depression:         Hematologic    Bleeding problems:    Problems with blood clotting too easily:        Skin    Rashes or ulcers:        Constitutional    Fever or chills:      PHYSICAL EXAM: There were no vitals filed for this visit.   GENERAL: The patient is a well-nourished male, in no acute distress. The vital signs are documented above. CARDIAC: There is a regular rate and rhythm.  VASCULAR:  Right PT palpable Right second toe amputation healed Left TMA previously healed    DATA:   ABIs today 0.85 right triphasic and 0.87 left triphasic  Right leg arterial duplex shows widely patent PT and TP trunk intervention following angioplasty with no recurrent stenosis.  Assessment/Plan:  62 y.o. male, with multiple risk factors including CKD, HTN, HLD, PVD, DM that presents for 75-month follow-up of his PAD.  Most recently underwent right leg intervention on 11/07/2022 with right PT and TP trunk angioplasty for second toe ulcer.  His PT is palpable at the ankle on exam.  Duplex confirms his right leg intervention remains patent without any significant restenosis.  He has close to normal ABIs bilaterally.  Right toe amputations healed.  Discussed I will have him follow-up with me again in 1 year with repeat studies.  Discussed he let me know if he develops any nonhealing wounds or ulcers.   Cephus Shelling, MD [facility] Office: [phone]

## 2023-06-27 ENCOUNTER — Other Ambulatory Visit: Payer: Self-pay

## 2023-06-27 DIAGNOSIS — I739 Peripheral vascular disease, unspecified: Secondary | ICD-10-CM

## 2023-06-27 DIAGNOSIS — D631 Anemia in chronic kidney disease: Secondary | ICD-10-CM

## 2023-06-30 ENCOUNTER — Inpatient Hospital Stay: Payer: Medicare Other

## 2023-06-30 ENCOUNTER — Inpatient Hospital Stay: Payer: Medicare Other | Attending: Physician Assistant

## 2023-06-30 VITALS — BP 130/61 | HR 67 | Temp 97.9°F | Resp 18

## 2023-06-30 DIAGNOSIS — I130 Hypertensive heart and chronic kidney disease with heart failure and stage 1 through stage 4 chronic kidney disease, or unspecified chronic kidney disease: Secondary | ICD-10-CM | POA: Insufficient documentation

## 2023-06-30 DIAGNOSIS — N182 Chronic kidney disease, stage 2 (mild): Secondary | ICD-10-CM | POA: Insufficient documentation

## 2023-06-30 DIAGNOSIS — D631 Anemia in chronic kidney disease: Secondary | ICD-10-CM

## 2023-06-30 LAB — CBC
HCT: 32.5 % — ABNORMAL LOW (ref 39.0–52.0)
Hemoglobin: 10.9 g/dL — ABNORMAL LOW (ref 13.0–17.0)
MCH: 31.2 pg (ref 26.0–34.0)
MCHC: 33.5 g/dL (ref 30.0–36.0)
MCV: 93.1 fL (ref 80.0–100.0)
Platelets: 186 10*3/uL (ref 150–400)
RBC: 3.49 MIL/uL — ABNORMAL LOW (ref 4.22–5.81)
RDW: 12.3 % (ref 11.5–15.5)
WBC: 6.6 10*3/uL (ref 4.0–10.5)
nRBC: 0 % (ref 0.0–0.2)

## 2023-06-30 MED ORDER — DARBEPOETIN ALFA 40 MCG/0.4ML IJ SOSY: 40.0000 ug | PREFILLED_SYRINGE | Freq: Once | INTRAMUSCULAR | Status: DC

## 2023-06-30 NOTE — Progress Notes (Signed)
Per Dr. Marice Potter last note, will hold Aranesp as hemoglobin is 10.9.

## 2023-07-24 NOTE — Patient Instructions (Signed)
Oscar Rose  07/24/2023     @PREFPERIOPPHARMACY @   Your procedure is scheduled on  07/31/2023.   [facility] to [facility] at  0700  A.M.   Call this number if you have problems the morning of surgery:  [phone]  If you experience any cold or flu symptoms such as cough, fever, chills, shortness of breath, etc. between now and your scheduled surgery, please notify us at the above number.   Remember:         Your last dose of iron should have been on 07/23/2023.         Your last dose of plavix should be on 07/25/2023.          Take 1/2 dose (10 units) of your semglee the night before your procedure.        DO NOT take any medications for diabetes the morning of your procedure.   Follow the diet and prep instructions given to you by the office.   You may drink clear liquids until 0500 am on 07/31/2023.    Clear liquids allowed are:                    Water, Juice (No red color; non-citric and without pulp; diabetics please choose diet or no sugar options), Carbonated beverages (diabetics please choose diet or no sugar options), Clear Tea (No creamer, milk, or cream, including half & half and powdered creamer), Black Coffee Only (No creamer, milk or cream, including half & half and powdered creamer), and Clear Sports drink (No red color; diabetics please choose diet or no sugar options)    Take these medicines the morning of surgery with A SIP OF WATER          baclofen, buspirone, labetolol, meclizine (if needed), pantoprazole, sertraline, tramadol(if needed).     Do not wear jewelry, make-up or nail polish, including gel polish,  artificial nails, or any other type of covering on natural nails (fingers and  toes).  Do not wear lotions, powders, or perfumes, or deodorant.  Do not shave 48 hours prior to surgery.  Men may shave face and neck.  Do not bring valuables to the hospital.  [facility] is not responsible for any belongings or valuables.  Contacts,  dentures or bridgework may not be worn into surgery.  Leave your suitcase in the car.  After surgery it may be brought to your room.  For patients admitted to the hospital, discharge time will be determined by your treatment team.  Patients discharged the day of surgery will not be allowed to drive home and must have someone with them for 24 hours.    Special instructions:     DO NOT smoke tobacco or vape for 24 hours before your procedure.  Please read over the following fact sheets that you were given. Anesthesia Post-op Instructions and Care and Recovery After Surgery      Upper Endoscopy, Adult, Care After After the procedure, it is common to have a sore throat. It is also common to have: Mild stomach pain or discomfort. Bloating. Nausea. Follow these instructions at home: The instructions below may help you care for yourself at home. Your health care provider may give you more instructions. If you have questions, ask your health care provider. If you were given a sedative during the procedure, it can affect you for several hours. Do not drive or operate machinery until your health care provider says  that it is safe. If you will be going home right after the procedure, plan to have a responsible adult: Take you home from the hospital or clinic. You will not be allowed to drive. Care for you for the time you are told. Follow instructions from your health care provider about what you may eat and drink. Return to your normal activities as told by your health care provider. Ask your health care provider what activities are safe for you. Take over-the-counter and prescription medicines only as told by your health care provider. Contact a health care provider if you: Have a sore throat that lasts longer than one day. Have trouble swallowing. Have a fever. Get help right away if you: Vomit blood or your vomit looks like coffee grounds. Have bloody, black, or tarry stools. Have a  very bad sore throat or you cannot swallow. Have difficulty breathing or very bad pain in your chest or abdomen. These symptoms may be an emergency. Get help right away. Call 911. Do not wait to see if the symptoms will go away. Do not drive yourself to the hospital. Summary After the procedure, it is common to have a sore throat, mild stomach discomfort, bloating, and nausea. If you were given a sedative during the procedure, it can affect you for several hours. Do not drive until your health care provider says that it is safe. Follow instructions from your health care provider about what you may eat and drink. Return to your normal activities as told by your health care provider. This information is not intended to replace advice given to you by your health care provider. Make sure you discuss any questions you have with your health care provider. Document Revised: 10/31/2021 Document Reviewed: 10/31/2021 Elsevier Patient Education  2024 Else[employer] Inc. Monitored Anesthesia Care, Care After The following information offers guidance on how to care for yourself after your procedure. Your health care provider may also give you more specific instructions. If you have problems or questions, contact your health care provider. What can I expect after the procedure? After the procedure, it is common to have: Tiredness. Little or no memory about what happened during or after the procedure. Impaired judgment when it comes to making decisions. Nausea or vomiting. Some trouble with balance. Follow these instructions at home: For the time period you were told by your health care provider:  Rest. Do not participate in activities where you could fall or become injured. Do not drive or use machinery. Do not drink alcohol. Do not take sleeping pills or medicines that cause drowsiness. Do not make important decisions or sign legal documents. Do not take care of children on your own. Medicines Take  over-the-counter and prescription medicines only as told by your health care provider. If you were prescribed antibiotics, take them as told by your health care provider. Do not stop using the antibiotic even if you start to feel better. Eating and drinking Follow instructions from your health care provider about what you may eat and drink. Drink enough fluid to keep your urine pale yellow. If you vomit: Drink clear fluids slowly and in small amounts as you are able. Clear fluids include water, ice chips, low-calorie sports drinks, and fruit juice that has water added to it (diluted fruit juice). Eat light and bland foods in small amounts as you are able. These foods include bananas, applesauce, rice, lean meats, toast, and crackers. General instructions  Have a responsible adult stay with you for the time you  are told. It is important to have someone help care for you until you are awake and alert. If you have sleep apnea, surgery and some medicines can increase your risk for breathing problems. Follow instructions from your health care provider about wearing your sleep device: When you are sleeping. This includes during daytime naps. While taking prescription pain medicines, sleeping medicines, or medicines that make you drowsy. Do not use any products that contain nicotine or tobacco. These products include cigarettes, chewing tobacco, and vaping devices, such as e-cigarettes. If you need help quitting, ask your health care provider. Contact a health care provider if: You feel nauseous or vomit every time you eat or drink. You feel light-headed. You are still sleepy or having trouble with balance after 24 hours. You get a rash. You have a fever. You have redness or swelling around the IV site. Get help right away if: You have trouble breathing. You have new confusion after you get home. These symptoms may be an emergency. Get help right away. Call 911. Do not wait to see if the symptoms  will go away. Do not drive yourself to the hospital. This information is not intended to replace advice given to you by your health care provider. Make sure you discuss any questions you have with your health care provider. Document Revised: 12/17/2021 Document Reviewed: 12/17/2021 Elsevier Patient Education  2024 [employer].

## 2023-07-25 ENCOUNTER — Other Ambulatory Visit: Payer: Self-pay

## 2023-07-25 ENCOUNTER — Telehealth (INDEPENDENT_AMBULATORY_CARE_PROVIDER_SITE_OTHER): Payer: Self-pay | Admitting: Gastroenterology

## 2023-07-25 ENCOUNTER — Encounter (HOSPITAL_COMMUNITY)
Admission: RE | Admit: 2023-07-25 | Discharge: 2023-07-25 | Disposition: A | Discharge status: Home / Self Care | Payer: Medicare Other | Source: Ambulatory Visit | Attending: Gastroenterology | Admitting: Gastroenterology

## 2023-07-25 VITALS — BP 186/78 | HR 81 | Temp 97.8°F | Resp 18 | Ht 70.5 in | Wt 254.0 lb

## 2023-07-25 DIAGNOSIS — D631 Anemia in chronic kidney disease: Secondary | ICD-10-CM | POA: Insufficient documentation

## 2023-07-25 DIAGNOSIS — E1122 Type 2 diabetes mellitus with diabetic chronic kidney disease: Secondary | ICD-10-CM | POA: Diagnosis not present

## 2023-07-25 DIAGNOSIS — Z01812 Encounter for preprocedural laboratory examination: Secondary | ICD-10-CM | POA: Diagnosis present

## 2023-07-25 DIAGNOSIS — N184 Chronic kidney disease, stage 4 (severe): Secondary | ICD-10-CM | POA: Insufficient documentation

## 2023-07-25 DIAGNOSIS — D649 Anemia, unspecified: Secondary | ICD-10-CM

## 2023-07-25 LAB — BASIC METABOLIC PANEL
Anion gap: 11 (ref 5–15)
BUN: 44 mg/dL — ABNORMAL HIGH (ref 6–20)
CO2: 22 mmol/L (ref 22–32)
Calcium: 8.4 mg/dL — ABNORMAL LOW (ref 8.9–10.3)
Chloride: 105 mmol/L (ref 98–111)
Creatinine, Ser: 2.55 mg/dL — ABNORMAL HIGH (ref 0.61–1.24)
GFR, Estimated: 28 mL/min — ABNORMAL LOW (ref >60)
Glucose, Bld: 119 mg/dL — ABNORMAL HIGH (ref 70–99)
Potassium: 4.1 mmol/L (ref 3.5–5.1)
Sodium: 138 mmol/L (ref 135–145)

## 2023-07-25 LAB — CBC WITH DIFFERENTIAL/PLATELET
Abs Immature Granulocytes: 0.01 10*3/uL (ref 0.00–0.07)
Basophils Absolute: 0 10*3/uL (ref 0.0–0.1)
Basophils Relative: 0 %
Eosinophils Absolute: 0.2 10*3/uL (ref 0.0–0.5)
Eosinophils Relative: 4 %
HCT: 29.7 % — ABNORMAL LOW (ref 39.0–52.0)
Hemoglobin: 9.7 g/dL — ABNORMAL LOW (ref 13.0–17.0)
Immature Granulocytes: 0 %
Lymphocytes Relative: 13 %
Lymphs Abs: 0.8 10*3/uL (ref 0.7–4.0)
MCH: 31.2 pg (ref 26.0–34.0)
MCHC: 32.7 g/dL (ref 30.0–36.0)
MCV: 95.5 fL (ref 80.0–100.0)
Monocytes Absolute: 0.4 10*3/uL (ref 0.1–1.0)
Monocytes Relative: 7 %
Neutro Abs: 4.7 10*3/uL (ref 1.7–7.7)
Neutrophils Relative %: 76 %
Platelets: 179 10*3/uL (ref 150–400)
RBC: 3.11 MIL/uL — ABNORMAL LOW (ref 4.22–5.81)
RDW: 13.7 % (ref 11.5–15.5)
WBC: 6.2 10*3/uL (ref 4.0–10.5)
nRBC: 0 % (ref 0.0–0.2)

## 2023-07-25 NOTE — Progress Notes (Signed)
EGD scheduled for 07/31/2023 is cancelled for now.  Patient has pneumonia and is on antibiotics. Sats are 91% on room air and patient is having shortness of breathe with any exertion. He will need to be rescheduled 2 weeks after symptoms subside.Marland Kitchen

## 2023-07-25 NOTE — Telephone Encounter (Signed)
Jethro Bolus, RN  Marlowe Shores, LPN; Laurence Ferrari, Avie Arenas, RN; Marguerita Merles, Reuel Boom, MD  EGD scheduled for 07/31/2023 is cancelled for now.  Patient has pneumonia and is on antibiotics. Sats are 91% on room air and patient is having shortness of breathe with any exertion. He will need to be rescheduled 2 weeks after symptoms subside..    Electronically signed by Jethro Bolus, RN at 07/25/2023  9:51 AM

## 2023-07-28 ENCOUNTER — Inpatient Hospital Stay: Payer: Medicare Other

## 2023-07-28 ENCOUNTER — Inpatient Hospital Stay: Payer: Medicare Other | Attending: Hematology

## 2023-07-28 DIAGNOSIS — N182 Chronic kidney disease, stage 2 (mild): Secondary | ICD-10-CM | POA: Diagnosis present

## 2023-07-28 DIAGNOSIS — D649 Anemia, unspecified: Secondary | ICD-10-CM

## 2023-07-28 DIAGNOSIS — I130 Hypertensive heart and chronic kidney disease with heart failure and stage 1 through stage 4 chronic kidney disease, or unspecified chronic kidney disease: Secondary | ICD-10-CM | POA: Insufficient documentation

## 2023-07-28 DIAGNOSIS — D631 Anemia in chronic kidney disease: Secondary | ICD-10-CM | POA: Diagnosis present

## 2023-07-28 LAB — CBC
HCT: 33.2 % — ABNORMAL LOW (ref 39.0–52.0)
Hemoglobin: 10.8 g/dL — ABNORMAL LOW (ref 13.0–17.0)
MCH: 30.8 pg (ref 26.0–34.0)
MCHC: 32.5 g/dL (ref 30.0–36.0)
MCV: 94.6 fL (ref 80.0–100.0)
Platelets: 188 10*3/uL (ref 150–400)
RBC: 3.51 MIL/uL — ABNORMAL LOW (ref 4.22–5.81)
RDW: 13.5 % (ref 11.5–15.5)
WBC: 6.1 10*3/uL (ref 4.0–10.5)
nRBC: 0 % (ref 0.0–0.2)

## 2023-07-28 NOTE — Telephone Encounter (Signed)
[name] left message calling to get pt rescheduled. Attempted to return call to patient but no answer

## 2023-07-28 NOTE — Progress Notes (Signed)
Hgb 10.8 today.  Per oncology note no injection needed if above 10.  Patient given copy with no complaints voiced.

## 2023-07-31 ENCOUNTER — Ambulatory Visit (HOSPITAL_COMMUNITY): Admission: RE | Admit: 2023-07-31 | Payer: Medicare Other | Source: Home / Self Care | Admitting: Gastroenterology

## 2023-07-31 ENCOUNTER — Encounter (HOSPITAL_COMMUNITY): Admission: RE | Payer: Self-pay | Source: Home / Self Care

## 2023-07-31 SURGERY — ESOPHAGOGASTRODUODENOSCOPY (EGD) WITH PROPOFOL
Anesthesia: Monitor Anesthesia Care

## 2023-08-05 NOTE — Telephone Encounter (Signed)
 [name] from [facility] contacted office. Pt has been rescheduled to 09/02/23. Instructions will be mailed once pre op has been received. Jerelene states it would be nice if he didn't have to do a preop advised [name] that since pt had pneumonia and shortness of breath on exertion he will possible have to go in person for pre op but I will ask endo.

## 2023-08-11 ENCOUNTER — Other Ambulatory Visit: Payer: Self-pay | Admitting: Pharmacy Technician

## 2023-08-23 NOTE — Progress Notes (Unsigned)
[facility] [address], [place] [address]   CLINIC:  Medical Oncology/Hematology  PCP:  Lindaann Pascal [address] 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  He was last seen by Dr. Ellin Saba on 04/08/2023.  At today's visit, he reports feeling fair.  He was treated outpatient for pneumonia in December 2024, but denies any other recent hospitalizations, surgeries, or changes in baseline health status.  He has had CBC checked monthly, but has not required any Retacrit injection since he is only dose that was given on 12/16/2022.  He denies any abnormal fatigue.  He is fairly active at [employer] and rides a stationary bike at the gym located there.  He denies any hematochezia, melena, or hematuria.  He denies ice pica. Current supplements include daily iron, Vitamin C, and multivitamin.  He has 70% energy and 100% appetite. He endorses that he is maintaining a stable weight.   ASSESSMENT & PLAN:  1.   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 Wolfgang Phoenix. - Received 1 unit PRBC in March 2024 - Patient initiated on Retacrit, received 1 dose 3000 units on 12/16/2022.  Has not required any Retacrit after first dose in May 2024. - He was started on Aranesp protocol (40 mcg every 4 weeks as needed if Hgb <10), but has not yet required injection. - Last Feraheme was on 11/11/2022. - Current supplements include daily iron, Vitamin C, and multivitamin. - Colonoscopy (03/11/2011): Nonbleeding internal hemorrhoids.  Tubular adenoma in the cecum and sessile serrated polyp removed. - EGD (03/11/2023): Normal esophagus, erosive gastropathy, nonbleeding gastric ulcer, biopsied was  benign. - Reports baseline/mild fatigue.   No ice pica. - No BRBPR/melena. - Labs today (08/25/2023): Hgb 12.2/MCV 91.2, creatinine 3.15, normal folate and B12.  Ferritin 315, iron saturation 22%. PENDING labs include SPEP, light chains, MMA.  (Will follow and will call patient if any significant abnormalities) - PLAN:  We will tentatively discontinue Aranesp protocol at this time.   - Will consider restarting ESA protocol if Hgb persistently <10.0 despite adequate iron stores.   - No indication for IV iron at this time - Labs and RTC in 3 months ** Message sent to Dr. Wolfgang Phoenix to notify him of creatinine slightly increased above baseline range.  2.  Social/Family History: Lives at [facility] the past 1.5 years because all of his toes on the left extremity and 2.5 toes on the right extremity were amputated. Worked in Clinical biochemist. No chemical exposure. No tobacco use. No family history of anemia. Mother had throat cancer.   PLAN SUMMARY: >> Follow pending labs - will call patient if any significant abnormalities >> DISCONTINUING Aranesp protocol >> Same-day labs (CBC/D, CMP, ferritin, iron/TIBC) + OFFICE visit in 3 months     REVIEW OF SYSTEMS:   Review of Systems  Constitutional:  Positive for fatigue (mild). Negative for appetite change, chills, diaphoresis, fever and unexpected weight change.  HENT:   Negative for lump/mass and nosebleeds.   Eyes:  Negative for eye problems.  Respiratory:  Negative for cough, hemoptysis and shortness of breath.   Cardiovascular:  Positive for chest pain (at times, none today). Negative for leg swelling and palpitations.  Gastrointestinal:  Negative for abdominal pain, blood in stool, constipation, diarrhea, nausea and vomiting.  Genitourinary:  Negative for hematuria.   Skin: Negative.   Neurological:  Positive for dizziness. Negative for headaches and light-headedness.  Hematological:  Does not bruise/bleed easily.     PHYSICAL EXAM:   ECOG PERFORMANCE STATUS: 1 - Symptomatic but completely ambulatory  Vitals:   08/25/23 0946  BP: 139/61  Pulse: 69  Resp: 18  Temp: (!) 97.4 F (36.3 C)  SpO2: 98%   Filed Weights   08/25/23 0946  Weight: 256 lb (116.1 kg)   Physical Exam Constitutional:      Appearance: Normal appearance. He is obese.  Cardiovascular:     Heart sounds: Normal heart sounds.  Pulmonary:     Breath sounds: Normal breath sounds.  Neurological:     General: No focal deficit present.     Mental Status: Mental status is at baseline.  Psychiatric:        Behavior: Behavior normal. Behavior is cooperative.     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  STROKE  JULY  2017   Tinea unguium    Type 2 diabetes mellitus (HCC)    Vitamin D deficiency    Past Surgical History:  Procedure Laterality Date   ABDOMINAL AORTOGRAM W/LOWER EXTREMITY N/A 11/08/2021   Procedure: ABDOMINAL AORTOGRAM W/LOWER EXTREMITY;  Surgeon: Cephus Shelling, MD;  [facility];  Service: Cardiovascular;  Laterality: N/A;   ABDOMINAL AORTOGRAM W/LOWER EXTREMITY Right 11/07/2022   Procedure: ABDOMINAL AORTOGRAM W/LOWER EXTREMITY;  Surgeon:  Cephus Shelling, MD;  Location: [facility];  Service: Cardiovascular;  Laterality: Right;   AMPUTATION Bilateral 2012   Left big toe partial and right big toe complete   BIOPSY  03/11/2023   Procedure: BIOPSY;  Surgeon: Marguerita Merles, Reuel Boom, MD;  Location: [facility];  Service: Gastroenterology;;  upper and lower bx's   CARDIOVASCULAR STRESS TEST  04-05-2014  dr croitoru   low risk lexiscan nuclear study with mild diaphragmatic attenuation artifact/  no ischemia/  ef 58%   CATARACT EXTRACTION W/ INTRAOCULAR LENS  IMPLANT, BILATERAL  2013   COLONOSCOPY WITH PROPOFOL N/A 03/11/2023   Procedure: COLONOSCOPY WITH PROPOFOL;  Surgeon: Dolores Frame, MD;  [facility]: [facility];  Service: Gastroenterology;  Laterality: N/A;  11:45 AM;ASA 3   CYSTO /  LEFT URETERAL STENT PLACEMENT  2012   CYSTOSCOPY W/ URETERAL STENT REMOVAL Left 07/07/2014  Procedure: CYSTO WITH LEFT PORTION STENT REMOVAL;  Surgeon: Anner Crete, MD;  Location: [facility];  Service: Urology;  Laterality: Left;   CYSTOSCOPY WITH URETEROSCOPY AND STENT PLACEMENT Left 07/07/2014   Procedure: CYSTOLITHALOPAXY URETEROSCOPY WITH STENT;  Surgeon: Anner Crete, MD;  Location: [facility];  Service: Urology;  Laterality: Left;   ENDARTERECTOMY Right 04/04/2016   Procedure: ENDARTERECTOMY CAROTID ARTERY RIGHT;  Surgeon: Chuck Hint, MD;  Location: [facility] OR;  Service: Vascular;  Laterality: Right;   ESOPHAGOGASTRODUODENOSCOPY (EGD) WITH PROPOFOL N/A 03/11/2023   Procedure: ESOPHAGOGASTRODUODENOSCOPY (EGD) WITH PROPOFOL;  Surgeon: Dolores Frame, MD;  [facility];  Service: Gastroenterology;  Laterality: N/A;  11:45 AM;ASA 3   HOLMIUM LASER APPLICATION N/A 07/07/2014   Procedure: HOLMIUM LASER APPLICATION;  Surgeon: Anner Crete, MD;  Location: [facility];  Service: Urology;  Laterality: N/A;   I & D  INFECTED SPIDER BITE UPPER BACK  06/ 2012    NEPHROLITHOTOMY Left 08/16/2014   Procedure: LEFT NEPHROLITHOTOMY PERCUTANEOUS;  Surgeon: Anner Crete, MD;  Location: [facility];  Service: Urology;  Laterality: Left;   NEPHROLITHOTOMY Left 08/18/2014   Procedure: LEFT NEPHROLITHOTOMY PERCUTANEOUS SECOND LOOK;  Surgeon: Anner Crete, MD;  Location: [facility];  Service: Urology;  Laterality: Left;   PATCH ANGIOPLASTY Right 04/04/2016   Procedure: PATCH ANGIOPLASTY RIGHT CAROTID ARTERY;  Surgeon: Chuck Hint, MD;  Location: [facility] OR;  Service: Vascular;  Laterality: Right;   PERIPHERAL VASCULAR BALLOON ANGIOPLASTY  11/08/2021   Procedure: PERIPHERAL VASCULAR BALLOON ANGIOPLASTY;  Surgeon: Cephus Shelling, MD;  [facility];  Service: Cardiovascular;;  Left AT   PERIPHERAL VASCULAR BALLOON ANGIOPLASTY Right 11/07/2022   Procedure: PERIPHERAL VASCULAR BALLOON ANGIOPLASTY;  Surgeon: Cephus Shelling, MD;  [facility];  Service: Cardiovascular;  Laterality: Right;  right TP trunk and PT   POLYPECTOMY  03/11/2023   Procedure: POLYPECTOMY;  Surgeon: Dolores Frame, MD;  [facility];  Service: Gastroenterology;;   RETINAL DETACHMENT SURGERY Bilateral 2013   TONSILLECTOMY AND ADENOIDECTOMY  as child    SOCIAL HISTORY:  Social History   Socioeconomic History   Marital status: Single    Spouse name: Not on file   Number of children: 0   Years of education: Not on file   Highest education level: Not on file  Occupational History   Not on file  Tobacco Use   Smoking status: Never   Smokeless tobacco: Never  Vaping Use   Vaping status: Never Used  Substance and Sexual Activity   Alcohol use: Not Currently   Drug use: No   Sexual activity: Not on file  Other Topics Concern   Not on file  Social History Narrative   Lives with "wife"      Update 11/05/2022   Lives at [facility] rehab currently   Right handed   Caffeine: none    Social Drivers of Health   Financial Resource  Strain: Low Risk  (09/05/2021)   Received from [facility], [facility]   Overall Financial Resource Strain (CARDIA)    Difficulty of Paying Living Expenses: Not hard at all  Food Insecurity: No Food Insecurity (05/13/2022)   Hunger Vital Sign    Worried About Running Out of Food in the Last Year: Never true    Ran Out of Food in the Last Year: Never true  Transportation Needs: No Transportation Needs (05/13/2022)  PRAPARE - Administrator, Civil Service (Medical): No    Lack of Transportation (Non-Medical): No  Physical Activity: Inactive (09/05/2021)   Received [facility], [facility]   Exercise Vital Sign    Days of Exercise per Week: 0 days    Minutes of Exercise per Session: 0 min  Stress: No Stress Concern Present (09/05/2021)   Received from [facility], [facility] - Occupational Stress Questionnaire    Feeling of Stress : Not at all  Social Connections: Unknown (09/05/2021)   Received from [facility], [facility]   Social Connection and Isolation Panel [NHANES]    Frequency of Communication with Friends and Family: Once a week    Frequency of Social Gatherings with Friends and Family: Once a week    Attends Religious Services: 1 to 4 times per year    Active Member of [employer] or Organizations: No    Attends Engineer, structural: 1 to 4 times per year    Marital Status: Not on file  Intimate Partner Violence: Not At Risk (05/13/2022)   Humiliation, Afraid, Rape, and Kick questionnaire    Fear of Current or Ex-Partner: No    Emotionally Abused: No    Physically Abused: No    Sexually Abused: No    FAMILY HISTORY:  Family History  Problem Relation Age of Onset   Emphysema Mother    Cancer Mother        Throat   Heart disease Mother    Stroke Neg Hx     CURRENT MEDICATIONS:  Outpatient Encounter Medications as of 08/25/2023  Medication Sig Note   albuterol (VENTOLIN HFA)  108 (90 Base) MCG/ACT inhaler Inhale into the lungs.    ipratropium-albuterol (DUONEB) 0.5-2.5 (3) MG/3ML SOLN     acetaminophen (TYLENOL) 650 MG CR tablet Take 650 mg by mouth every 4 (four) hours as needed for pain.    Ascorbic Acid (VITAMIN C PO) Take 500 mg by mouth 2 (two) times daily.    aspirin EC 81 MG tablet Take 81 mg by mouth daily.    atorvastatin (LIPITOR) 40 MG tablet Take 40 mg by mouth at bedtime.    Baclofen 5 MG TABS Take 1 tablet (5 mg total) by mouth 3 (three) times daily as needed (muscle spasm). (Patient taking differently: Take 5 mg by mouth every 8 (eight) hours as needed (muscle spasm).)    busPIRone (BUSPAR) 5 MG tablet Take 5 mg by mouth 2 (two) times daily.    cloNIDine (CATAPRES - DOSED IN MG/24 HR) 0.3 mg/24hr patch Place 1 patch (0.3 mg total) onto the skin once a week. (Patient taking differently: Place 0.3 mg onto the skin every Friday.) 03/11/2023: Left upper arm   clopidogrel (PLAVIX) 75 MG tablet Take 75 mg by mouth daily.    ferrous sulfate 325 (65 FE) MG tablet Take 325 mg by mouth daily with breakfast.    furosemide (LASIX) 40 MG tablet Take 1 tablet (40 mg total) by mouth daily.    GLUCAGON, RDNA, IJ Inject as directed. Prn  Hypoglycemia    guaiFENesin (ROBITUSSIN) 100 MG/5ML liquid Take 5 mLs by mouth every 4 (four) hours as needed for cough or to loosen phlegm.    labetalol (NORMODYNE) 200 MG tablet Take 1 tablet (200 mg total) by mouth 2 (two) times daily.    loperamide (IMODIUM A-D) 2 MG tablet Take 2 mg by  mouth 4 (four) times daily as needed for diarrhea or loose stools. 2 caplets initially, then 1 caplet following each loose stool.Do not exceed 4 caplets in a 24 hour period.    meclizine (ANTIVERT) 25 MG tablet Take 25 mg by mouth every 8 (eight) hours.    mineral oil-hydrophilic petrolatum (AQUAPHOR) ointment Apply 1 Application topically at bedtime.    Multiple Vitamin (MULTIVITAMIN) tablet Take 1 tablet by mouth daily.    ondansetron (ZOFRAN-ODT) 4  MG disintegrating tablet Take 4 mg by mouth every 4 (four) hours as needed for nausea or vomiting. If not resolved after two doses then Phenergan 25 mg Q 6 hours prn.    OVER THE COUNTER MEDICATION Fleets enema x 1, if no results from above call the MD  Mylanta 30 cc po Q 2 hours prn acid indigestion. Do not exceed 4 doses in 24 hours.  Pro stat 64 Oral liquid 30 ml in the am's for wound healing.    pantoprazole (PROTONIX) 40 MG tablet Take 1 tablet (40 mg total) by mouth daily.    SANTYL 250 UNIT/GM ointment Apply topically.    sertraline (ZOLOFT) 25 MG tablet Take 1 tablet (25 mg total) by mouth daily.    sodium zirconium cyclosilicate (LOKELMA) 10 g PACK packet Take 10 g by mouth. One every morning on M,W,F for Hyperkalemia.    traMADol (ULTRAM) 50 MG tablet Take 1 tablet (50 mg total) by mouth 2 (two) times daily as needed for moderate pain.    VELTASSA 8.4 g packet Take 1 packet by mouth daily.    [DISCONTINUED] insulin glargine (SEMGLEE) 100 UNIT/ML injection Inject 20 Units into the skin at bedtime. Prn hold for CBG less than 120. Notify MD if CBG greater that 450    [DISCONTINUED] olmesartan (BENICAR) 5 MG tablet Take 5 mg by mouth daily.    [DISCONTINUED] SEMGLEE, YFGN, 100 UNIT/ML injection Inject into the skin.    No facility-administered encounter medications on file as of 08/25/2023.    ALLERGIES:  No Known Allergies  LABORATORY DATA:  I have reviewed the labs as listed.  CBC    Component Value Date/Time   WBC 6.4 08/25/2023 0847   RBC 3.98 (L) 08/25/2023 0847   HGB 12.2 (L) 08/25/2023 0847   HGB 14.1 04/30/2017 1538   HCT 36.3 (L) 08/25/2023 0847   HCT 41.9 04/30/2017 1538   PLT 201 08/25/2023 0847   PLT 249 04/30/2017 1538   MCV 91.2 08/25/2023 0847   MCV 88 04/30/2017 1538   MCH 30.7 08/25/2023 0847   MCHC 33.6 08/25/2023 0847   RDW 12.6 08/25/2023 0847   RDW 13.7 04/30/2017 1538   LYMPHSABS 1.0 08/25/2023 0847   MONOABS 0.4 08/25/2023 0847   EOSABS 0.3  08/25/2023 0847   BASOSABS 0.0 08/25/2023 0847      Latest Ref Rng & Units 08/25/2023    8:47 AM 07/25/2023    9:37 AM 02/14/2023    8:47 AM  CMP  Glucose 70 - 99 mg/dL 161  096  045   BUN 6 - 20 mg/dL 55  44  63   Creatinine 0.61 - 1.24 mg/dL 4.09  8.11  9.14   Sodium 135 - 145 mmol/L 137  138  136   Potassium 3.5 - 5.1 mmol/L 4.2  4.1  4.8   Chloride 98 - 111 mmol/L 100  105  105   CO2 22 - 32 mmol/L 27  22  22    Calcium 8.9 - 10.3  mg/dL 9.0  8.4  9.0     DIAGNOSTIC IMAGING:  I have independently reviewed the relevant imaging and discussed with the patient.   WRAP UP:  All questions were answered. The patient knows to call the clinic with any problems, questions or concerns.  Medical decision making: Moderate  Time spent on visit: I spent 20 minutes counseling the patient face to face. The total time spent in the appointment was 30 minutes and more than 50% was on counseling.  Carnella Guadalajara, PA-C  08/25/23 10:30 AM

## 2023-08-24 ENCOUNTER — Other Ambulatory Visit: Payer: Self-pay | Admitting: Physician Assistant

## 2023-08-24 DIAGNOSIS — D649 Anemia, unspecified: Secondary | ICD-10-CM

## 2023-08-25 ENCOUNTER — Inpatient Hospital Stay: Payer: Medicare Other

## 2023-08-25 ENCOUNTER — Inpatient Hospital Stay: Payer: Medicare Other | Attending: Physician Assistant | Admitting: Physician Assistant

## 2023-08-25 VITALS — BP 139/61 | HR 69 | Temp 97.4°F | Resp 18 | Ht 70.5 in | Wt 256.0 lb

## 2023-08-25 DIAGNOSIS — I130 Hypertensive heart and chronic kidney disease with heart failure and stage 1 through stage 4 chronic kidney disease, or unspecified chronic kidney disease: Secondary | ICD-10-CM | POA: Diagnosis present

## 2023-08-25 DIAGNOSIS — N184 Chronic kidney disease, stage 4 (severe): Secondary | ICD-10-CM

## 2023-08-25 DIAGNOSIS — D649 Anemia, unspecified: Secondary | ICD-10-CM

## 2023-08-25 DIAGNOSIS — D631 Anemia in chronic kidney disease: Secondary | ICD-10-CM | POA: Insufficient documentation

## 2023-08-25 DIAGNOSIS — N182 Chronic kidney disease, stage 2 (mild): Secondary | ICD-10-CM | POA: Insufficient documentation

## 2023-08-25 LAB — BASIC METABOLIC PANEL
Anion gap: 10 (ref 5–15)
BUN: 55 mg/dL — ABNORMAL HIGH (ref 6–20)
CO2: 27 mmol/L (ref 22–32)
Calcium: 9 mg/dL (ref 8.9–10.3)
Chloride: 100 mmol/L (ref 98–111)
Creatinine, Ser: 3.15 mg/dL — ABNORMAL HIGH (ref 0.61–1.24)
GFR, Estimated: 22 mL/min — ABNORMAL LOW (ref >60)
Glucose, Bld: 167 mg/dL — ABNORMAL HIGH (ref 70–99)
Potassium: 4.2 mmol/L (ref 3.5–5.1)
Sodium: 137 mmol/L (ref 135–145)

## 2023-08-25 LAB — CBC WITH DIFFERENTIAL/PLATELET
Abs Immature Granulocytes: 0.01 10*3/uL (ref 0.00–0.07)
Basophils Absolute: 0 10*3/uL (ref 0.0–0.1)
Basophils Relative: 1 %
Eosinophils Absolute: 0.3 10*3/uL (ref 0.0–0.5)
Eosinophils Relative: 4 %
HCT: 36.3 % — ABNORMAL LOW (ref 39.0–52.0)
Hemoglobin: 12.2 g/dL — ABNORMAL LOW (ref 13.0–17.0)
Immature Granulocytes: 0 %
Lymphocytes Relative: 16 %
Lymphs Abs: 1 10*3/uL (ref 0.7–4.0)
MCH: 30.7 pg (ref 26.0–34.0)
MCHC: 33.6 g/dL (ref 30.0–36.0)
MCV: 91.2 fL (ref 80.0–100.0)
Monocytes Absolute: 0.4 10*3/uL (ref 0.1–1.0)
Monocytes Relative: 7 %
Neutro Abs: 4.7 10*3/uL (ref 1.7–7.7)
Neutrophils Relative %: 72 %
Platelets: 201 10*3/uL (ref 150–400)
RBC: 3.98 MIL/uL — ABNORMAL LOW (ref 4.22–5.81)
RDW: 12.6 % (ref 11.5–15.5)
WBC: 6.4 10*3/uL (ref 4.0–10.5)
nRBC: 0 % (ref 0.0–0.2)

## 2023-08-25 LAB — VITAMIN B12: Vitamin B-12: 449 pg/mL (ref 180–914)

## 2023-08-25 LAB — FERRITIN: Ferritin: 315 ng/mL (ref 24–336)

## 2023-08-25 LAB — IRON AND TIBC
Iron: 61 ug/dL (ref 45–182)
Saturation Ratios: 22 % (ref 17.9–39.5)
TIBC: 277 ug/dL (ref 250–450)
UIBC: 216 ug/dL

## 2023-08-25 LAB — FOLATE: Folate: 16.1 ng/mL (ref >5.9)

## 2023-08-25 NOTE — Progress Notes (Signed)
Hgb noted to be 12.2 today.  Per order parameters Aransep will be held for Hgb greater than 10.

## 2023-08-25 NOTE — Patient Instructions (Addendum)
[facility] **VISIT SUMMARY & IMPORTANT INSTRUCTIONS **   You were seen today by Rojelio Brenner PA-C for your anemia (low red blood count / hemoglobin). Your anemia is related to your chronic kidney disease and iron deficiency. Your iron levels look great.  You do not need any IV iron at this time.  Continue taking daily iron tablet. Your blood counts are currently adequate.  (Our goal is for your hemoglobin to be higher than 10). Since you have not required any injection since May 2024, we will DISCONTINUE your Retacrit/Aranesp injections at this time. We will check labs and see you for office visit again in 3 months. If your hemoglobin is persistently <10.0 despite normal iron levels, we would consider restarting you on Retacrit/Aranesp injections in the future.  LABS: We checked additional labs today to look for other causes of anemia.  If any of these labs are abnormal, we will notify you and/or discuss at your next visit.  FOLLOW-UP APPOINTMENT: Labs and office visit in 3 months  ** Thank you for trusting me with your healthcare!  I strive to provide all of my patients with quality care at each visit.  If you receive a survey for this visit, I would be so grateful to you for taking the time to provide feedback.  Thank you in advance!  ~ Mort Smelser                   Dr. Doreatha Massed   &   Rojelio Brenner, PA-C   - - - - - - - - - - - - - - - - - -    Thank you for choosing [facility] to provide your oncology and hematology care.  To afford each patient quality time with our provider, please arrive at least 15 minutes before your scheduled appointment time.   If you have a lab appointment with the Cancer Center please come in thru the Main Entrance and check in at the main information desk.  You need to re-schedule your appointment should you arrive 10 or more minutes late.  We strive to give you  quality time with our providers, and arriving late affects you and other patients whose appointments are after yours.  Also, if you no show three or more times for appointments you may be dismissed from the clinic at the providers discretion.     Again, thank you for choosing [facility].  Our hope is that these requests will decrease the amount of time that you wait before being seen by our physicians.       _____________________________________________________________  Should you have questions after your visit to [facility], please contact our office at [phone] and follow the prompts.  Our office hours are 8:00 a.m. and 4:30 p.m. Monday - Friday.  Please note that voicemails left after 4:00 p.m. may not be returned until the following business day.  We are closed weekends and major holidays.  You do have access to a nurse 24-7, just call the main number to the clinic [phone] and do not press any options, hold on the line and a nurse will answer the phone.    For prescription refill requests, have your pharmacy contact our office and allow 72 hours.

## 2023-08-26 ENCOUNTER — Encounter (HOSPITAL_COMMUNITY): Payer: Self-pay

## 2023-08-26 ENCOUNTER — Encounter (HOSPITAL_COMMUNITY)
Admission: RE | Admit: 2023-08-26 | Discharge: 2023-08-26 | Disposition: A | Discharge status: Home / Self Care | Payer: Medicare Other | Source: Ambulatory Visit | Attending: Gastroenterology | Admitting: Gastroenterology

## 2023-08-26 LAB — KAPPA/LAMBDA LIGHT CHAINS
Kappa free light chain: 82.4 mg/L — ABNORMAL HIGH (ref 3.3–19.4)
Kappa, lambda light chain ratio: 2.37 — ABNORMAL HIGH (ref 0.26–1.65)
Lambda free light chains: 34.7 mg/L — ABNORMAL HIGH (ref 5.7–26.3)

## 2023-08-27 LAB — METHYLMALONIC ACID, SERUM: Methylmalonic Acid, Quantitative: 382 nmol/L — ABNORMAL HIGH (ref 0–378)

## 2023-09-01 ENCOUNTER — Telehealth (INDEPENDENT_AMBULATORY_CARE_PROVIDER_SITE_OTHER): Payer: Self-pay | Admitting: Gastroenterology

## 2023-09-01 LAB — PROTEIN ELECTROPHORESIS, SERUM
A/G Ratio: 1.3 (ref 0.7–1.7)
Albumin ELP: 3.8 g/dL (ref 2.9–4.4)
Alpha-1-Globulin: 0.3 g/dL (ref 0.0–0.4)
Alpha-2-Globulin: 0.8 g/dL (ref 0.4–1.0)
Beta Globulin: 1.1 g/dL (ref 0.7–1.3)
Gamma Globulin: 0.9 g/dL (ref 0.4–1.8)
Globulin, Total: 3 g/dL (ref 2.2–3.9)
Total Protein ELP: 6.8 g/dL (ref 6.0–8.5)

## 2023-09-01 NOTE — Telephone Encounter (Signed)
Thanks for the update

## 2023-09-01 NOTE — Telephone Encounter (Signed)
FYI-Mrs.Bullins from [facility] contacted office and states pt would like to cancel EGD for tomorrow. Does not want to reschedule at this time.

## 2023-09-02 ENCOUNTER — Ambulatory Visit (HOSPITAL_COMMUNITY): Admission: RE | Admit: 2023-09-02 | Payer: Medicare Other | Source: Home / Self Care | Admitting: Gastroenterology

## 2023-09-02 ENCOUNTER — Encounter (HOSPITAL_COMMUNITY): Admission: RE | Payer: Self-pay | Source: Home / Self Care

## 2023-09-02 SURGERY — ESOPHAGOGASTRODUODENOSCOPY (EGD) WITH PROPOFOL
Anesthesia: Monitor Anesthesia Care

## 2023-09-22 NOTE — Telephone Encounter (Signed)
Oscar Rose contacted office to get pt rescheduled. We have no availability from now until end of march (pt is ASA 3). Will put reminder in my folder for April.

## 2023-10-09 NOTE — Telephone Encounter (Signed)
 Left message on Oscar Rose voicemail to return call

## 2023-10-23 ENCOUNTER — Telehealth (INDEPENDENT_AMBULATORY_CARE_PROVIDER_SITE_OTHER): Payer: Self-pay | Admitting: Gastroenterology

## 2023-10-23 NOTE — Telephone Encounter (Signed)
 Contacted [name] at [facility] to have pt scheduled for EGD. [name] states pt may not want to have it done but we could go ahead and schedule him. Looking through dates for April, the ones we have do not work for facility. Massie Bougie states she will call me back to get pt scheduled.

## 2023-10-25 NOTE — Telephone Encounter (Signed)
 Thanks

## 2023-11-04 ENCOUNTER — Telehealth (INDEPENDENT_AMBULATORY_CARE_PROVIDER_SITE_OTHER): Payer: Self-pay | Admitting: Gastroenterology

## 2023-11-04 NOTE — Telephone Encounter (Signed)
 [name] at [facility] called to get patient scheduled his EGD/TCS. [phone]

## 2023-11-05 ENCOUNTER — Telehealth: Payer: Self-pay | Admitting: Gastroenterology

## 2023-11-05 NOTE — Telephone Encounter (Signed)
Left message for Oscar Rose to return call

## 2023-11-05 NOTE — Telephone Encounter (Signed)
 Oscar Rose at [facility] is needing to schedule for an appt.  [phone]

## 2023-11-10 NOTE — Telephone Encounter (Signed)
 [name] from [facility] called in and states that pt does not want to have EGD

## 2023-11-22 NOTE — Progress Notes (Unsigned)
 [facility] [address], [place] [address]   CLINIC:  Medical Oncology/Hematology  PCP:  Oswald Bless [address] 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  He was last seen by Sheril Dines PA-C on 08/25/2023.  At today's visit, he reports feeling fair.  He denies any recent hospitalizations, surgeries, or changes in baseline health status. He denies any abnormal fatigue.  He is fairly active at [employer] and rides a stationary bike at the gym located there.  He denies any hematochezia, melena, or hematuria. He denies ice pica. Current supplements include daily iron, Vitamin C, and multivitamin.  He has 50% energy and 75% appetite. He endorses that he is maintaining a stable weight.  ASSESSMENT & PLAN:  1.   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 Carrolyn Clan. - Received 1 unit PRBC in March 2024 - Patient initiated on Retacrit , received 1 dose 3000 units on 12/16/2022.  Has not required any Retacrit  after first dose in May 2024. - He has never required Aranesp  or other ESA injections. - Last Feraheme was on 11/11/2022. - Current supplements include daily iron, Vitamin C, and multivitamin. - Colonoscopy (03/11/2011): Nonbleeding internal hemorrhoids.  Tubular adenoma in the cecum and sessile serrated polyp removed. - EGD (03/11/2023): Normal esophagus, erosive gastropathy, nonbleeding gastric ulcer, biopsied was benign. - Labs from 08/25/2023 showed CKD stage IV.  Normal folate.  Iron levels at goal. MMA mildly elevated at 382.  Normal B12 449. SPEP negative for M spike.  Minimally elevated FLC ratio 2.37 (likely due to CKD stage IV)  - Reports baseline/mild  fatigue.   No ice pica. - No BRBPR/melena. - Labs today (11/24/2023): Hgb 11.0/MCV 89.9, creatinine 2.80/GFR 25.  Ferritin 251, iron saturation 29%. - PLAN:  Will consider restarting ESA protocol if Hgb persistently <10.0 despite adequate iron stores.   - No indication for IV iron at this time - Labs and RTC in 4 months  2.  Social/Family History: Lives at [facility] the past 1.5 years because all of his toes on the left extremity and 2.5 toes on the right extremity were amputated. Worked in Clinical biochemist. No chemical exposure. No tobacco use. No family history of anemia. Mother had throat cancer.   PLAN SUMMARY:  >> Same-day labs (CBC/D, CMP, ferritin, iron/TIBC) + OFFICE visit in 4 months     REVIEW OF SYSTEMS:  Review of Systems  Constitutional:  Positive for fatigue (mild). Negative for appetite change, chills, diaphoresis, fever and unexpected weight change.  HENT:   Negative for lump/mass and nosebleeds.   Eyes:  Negative for eye problems.  Respiratory:  Negative for cough, hemoptysis and shortness of breath.   Cardiovascular:  Negative for chest pain (at times, none today), leg swelling and palpitations.  Gastrointestinal:  Negative for abdominal pain, blood in stool, constipation, diarrhea, nausea and vomiting.  Genitourinary:  Negative for hematuria.   Musculoskeletal:  Positive for arthralgias and myalgias.  Skin: Negative.   Neurological:  Positive for dizziness. Negative for headaches and light-headedness.  Hematological:  Does not bruise/bleed easily.     PHYSICAL EXAM:  ECOG PERFORMANCE STATUS: 1 - Symptomatic but completely ambulatory  Vitals:   11/24/23 1347  BP: (!) 150/72  Pulse: 70  Resp: 16  Temp: 97.9 F (36.6 C)  SpO2: 94%    Filed Weights   11/24/23 1347  Weight: 271 lb 6.2 oz (123.1 kg)    Physical Exam Constitutional:      Appearance: Normal appearance. He is obese.  Cardiovascular:     Heart sounds: Normal heart sounds.  Pulmonary:      Breath sounds: Normal breath sounds.  Neurological:     General: No focal deficit present.     Mental Status: Mental status is at baseline.  Psychiatric:        Behavior: Behavior normal. Behavior is cooperative.     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  STROKE  JULY  2017   Tinea unguium    Type 2 diabetes mellitus (HCC)    Vitamin D deficiency    Past Surgical History:  Procedure Laterality Date   ABDOMINAL AORTOGRAM W/LOWER EXTREMITY N/A 11/08/2021   Procedure: ABDOMINAL AORTOGRAM W/LOWER EXTREMITY;  Surgeon: Young Hensen, MD;  [facility]: [facility];  Service: Cardiovascular;  Laterality: N/A;   ABDOMINAL AORTOGRAM W/LOWER EXTREMITY Right 11/07/2022   Procedure: ABDOMINAL AORTOGRAM W/LOWER EXTREMITY;  Surgeon: Young Hensen, MD;  Location: [facility];  Service: Cardiovascular;  Laterality: Right;   AMPUTATION Bilateral 2012   Left big toe partial and right big toe complete   BIOPSY  03/11/2023   Procedure: BIOPSY;  Surgeon: Umberto Ganong, Bearl Limes, MD;  Location: [facility];  Service: Gastroenterology;;  upper and lower bx's   CARDIOVASCULAR STRESS TEST  04-05-2014  dr croitoru    low risk lexiscan  nuclear study with mild diaphragmatic attenuation artifact/  no ischemia/  ef 58%   CATARACT EXTRACTION W/ INTRAOCULAR LENS  IMPLANT, BILATERAL  2013   COLONOSCOPY WITH PROPOFOL  N/A 03/11/2023   Procedure: COLONOSCOPY WITH PROPOFOL ;  Surgeon: Urban Garden, MD;  [facility]: [facility];  Service: Gastroenterology;  Laterality: N/A;  11:45 AM;ASA 3   CYSTO /  LEFT URETERAL STENT PLACEMENT  2012   CYSTOSCOPY W/ URETERAL STENT REMOVAL Left 07/07/2014   Procedure: CYSTO WITH LEFT PORTION STENT REMOVAL;  Surgeon: Willye Harvey, MD;  Location: [facility];  Service: Urology;  Laterality: Left;   CYSTOSCOPY WITH URETEROSCOPY AND STENT PLACEMENT Left 07/07/2014   Procedure: CYSTOLITHALOPAXY URETEROSCOPY WITH STENT;  Surgeon: Willye Harvey, MD;  Location: [facility];  Service: Urology;  Laterality: Left;  ENDARTERECTOMY Right 04/04/2016   Procedure: ENDARTERECTOMY CAROTID ARTERY RIGHT;  Surgeon: Dannis Dy, MD;  Location: [facility] OR;  Service: Vascular;  Laterality: Right;   ESOPHAGOGASTRODUODENOSCOPY (EGD) WITH PROPOFOL  N/A 03/11/2023   Procedure: ESOPHAGOGASTRODUODENOSCOPY (EGD) WITH PROPOFOL ;  Surgeon: Urban Garden, MD;  [facility];  Service: Gastroenterology;  Laterality: N/A;  11:45 AM;ASA 3   HOLMIUM LASER APPLICATION N/A 07/07/2014   Procedure: HOLMIUM LASER APPLICATION;  Surgeon: Willye Harvey, MD;  Location: [facility];  Service: Urology;  Laterality: N/A;   I & D  INFECTED SPIDER BITE UPPER BACK  06/ 2012   NEPHROLITHOTOMY Left 08/16/2014   Procedure: LEFT NEPHROLITHOTOMY PERCUTANEOUS;  Surgeon: Willye Harvey, MD;  Location: [facility];  Service: Urology;  Laterality: Left;   NEPHROLITHOTOMY Left 08/18/2014   Procedure: LEFT NEPHROLITHOTOMY PERCUTANEOUS SECOND LOOK;  Surgeon: Willye Harvey, MD;  Location: [facility];  Service: Urology;  Laterality: Left;   PATCH ANGIOPLASTY Right 04/04/2016   Procedure:  PATCH ANGIOPLASTY RIGHT CAROTID ARTERY;  Surgeon: Dannis Dy, MD;  Location: [facility] OR;  Service: Vascular;  Laterality: Right;   PERIPHERAL VASCULAR BALLOON ANGIOPLASTY  11/08/2021   Procedure: PERIPHERAL VASCULAR BALLOON ANGIOPLASTY;  Surgeon: Young Hensen, MD;  [facility];  Service: Cardiovascular;;  Left AT   PERIPHERAL VASCULAR BALLOON ANGIOPLASTY Right 11/07/2022   Procedure: PERIPHERAL VASCULAR BALLOON ANGIOPLASTY;  Surgeon: Young Hensen, MD;  [facility];  Service: Cardiovascular;  Laterality: Right;  right TP trunk and PT   POLYPECTOMY  03/11/2023   Procedure: POLYPECTOMY;  Surgeon: Urban Garden, MD;  [facility];  Service: Gastroenterology;;   RETINAL DETACHMENT SURGERY Bilateral 2013   TONSILLECTOMY AND ADENOIDECTOMY  as child    SOCIAL HISTORY:  Social History   Socioeconomic History   Marital status: Single    Spouse name: Not on file   Number of children: 0   Years of education: Not on file   Highest education level: Not on file  Occupational History   Not on file  Tobacco Use   Smoking status: Never   Smokeless tobacco: Never  Vaping Use   Vaping status: Never Used  Substance and Sexual Activity   Alcohol use: Not Currently   Drug use: No   Sexual activity: Not on file  Other Topics Concern   Not on file  Social History Narrative   Lives with "wife"      Update 11/05/2022   Lives at [facility] rehab currently   Right handed   Caffeine: none    Social Drivers of Corporate investment banker Strain: Low Risk  (09/05/2021)   Received from [facility], [facility]   Overall Financial Resource Strain (CARDIA)    Difficulty of Paying Living Expenses: Not hard at all  Food Insecurity: Low Risk  (10/10/2023)   Received from [facility]   Hunger Vital Sign    Worried About Running Out of Food in the Last Year: Never true    Ran Out of Food in the Last Year: Never true   Transportation Needs: No Transportation Needs (10/10/2023)   Received from [facility]    In the past 12 months, has lack of reliable transportation kept you from medical appointments, meetings, work or from getting things needed for daily living? : No  Physical Activity: Inactive (09/05/2021)   Received from [facility], [facility]  Exercise Vital Sign    Days of Exercise per Week: 0 days    Minutes of Exercise per Session: 0 min  Stress: No Stress Concern Present (09/05/2021)   Received from [facility], [facility]   [facility] - Occupational Stress Questionnaire    Feeling of Stress : Not at all  Social Connections: Unknown (09/05/2021)   Received from [facility], [facility]   Social Connection and Isolation Panel [NHANES]    Frequency of Communication with Friends and Family: Once a week    Frequency of Social Gatherings with Friends and Family: Once a week    Attends Religious Services: 1 to 4 times per year    Active Member of [employer] or Organizations: No    Attends Engineer, structural: 1 to 4 times per year    Marital Status: Not on file  Intimate Partner Violence: Not At Risk (05/13/2022)   Humiliation, Afraid, Rape, and Kick questionnaire    Fear of Current or Ex-Partner: No    Emotionally Abused: No    Physically Abused: No    Sexually Abused: No    FAMILY HISTORY:  Family History  Problem Relation Age of Onset   Emphysema Mother    Cancer Mother        Throat   Heart disease Mother    Stroke Neg Hx     CURRENT MEDICATIONS:  Outpatient Encounter Medications as of 11/24/2023  Medication Sig Note   Ascorbic Acid (VITAMIN C PO) Take 500 mg by mouth 2 (two) times daily.    aspirin  EC 81 MG tablet Take 81 mg by mouth daily.    Baclofen  5 MG TABS Take 1 tablet (5 mg total) by mouth 3 (three) times daily as needed (muscle spasm). (Patient taking differently: Take 5 mg by mouth every 8 (eight)  hours as needed (muscle spasm).)    busPIRone  (BUSPAR ) 5 MG tablet Take 5 mg by mouth 2 (two) times daily.    cloNIDine  (CATAPRES  - DOSED IN MG/24 HR) 0.3 mg/24hr patch Place 1 patch (0.3 mg total) onto the skin once a week. (Patient taking differently: Place 0.3 mg onto the skin every Friday.) 03/11/2023: Left upper arm   clopidogrel  (PLAVIX ) 75 MG tablet Take 75 mg by mouth daily.    ferrous sulfate  325 (65 FE) MG tablet Take 325 mg by mouth daily with breakfast.    furosemide  (LASIX ) 40 MG tablet Take 1 tablet (40 mg total) by mouth daily.    hydrALAZINE  (APRESOLINE ) 50 MG tablet Take 50 mg by mouth.    insulin  glargine-yfgn (SEMGLEE ) 100 UNIT/ML Pen     ipratropium-albuterol  (DUONEB) 0.5-2.5 (3) MG/3ML SOLN     meclizine (ANTIVERT) 25 MG tablet Take 25 mg by mouth every 8 (eight) hours.    mineral oil-hydrophilic petrolatum (AQUAPHOR) ointment Apply 1 Application topically at bedtime.    Multiple Vitamin (MULTIVITAMIN) tablet Take 1 tablet by mouth daily.    pantoprazole  (PROTONIX ) 40 MG tablet Take 1 tablet (40 mg total) by mouth daily.    VELTASSA 8.4 g packet Take 1 packet by mouth daily.    acetaminophen  (TYLENOL ) 650 MG CR tablet Take 650 mg by mouth every 4 (four) hours as needed for pain.    albuterol  (VENTOLIN  HFA) 108 (90 Base) MCG/ACT inhaler Inhale into the lungs. (Patient not taking: Reported on 11/24/2023)    atorvastatin  (LIPITOR) 40 MG tablet Take 40 mg by mouth at bedtime.  GLUCAGON, RDNA, IJ Inject as directed. Prn  Hypoglycemia (Patient not taking: Reported on 11/24/2023)    guaiFENesin  (ROBITUSSIN) 100 MG/5ML liquid Take 5 mLs by mouth every 4 (four) hours as needed for cough or to loosen phlegm. (Patient not taking: Reported on 11/24/2023)    labetalol  (NORMODYNE ) 200 MG tablet Take 1 tablet (200 mg total) by mouth 2 (two) times daily.    loperamide (IMODIUM A-D) 2 MG tablet Take 2 mg by mouth 4 (four) times daily as needed for diarrhea or loose stools. 2 caplets initially,  then 1 caplet following each loose stool.Do not exceed 4 caplets in a 24 hour period. (Patient not taking: Reported on 11/24/2023)    ondansetron  (ZOFRAN -ODT) 4 MG disintegrating tablet Take 4 mg by mouth every 4 (four) hours as needed for nausea or vomiting. If not resolved after two doses then Phenergan  25 mg Q 6 hours prn. (Patient not taking: Reported on 11/24/2023)    OVER THE COUNTER MEDICATION Fleets enema x 1, if no results from above call the MD  Mylanta 30 cc po Q 2 hours prn acid indigestion. Do not exceed 4 doses in 24 hours.  Pro stat 64 Oral liquid 30 ml in the am's for wound healing. (Patient not taking: Reported on 11/24/2023)    SANTYL 250 UNIT/GM ointment Apply topically. (Patient not taking: Reported on 11/24/2023)    sertraline  (ZOLOFT ) 25 MG tablet Take 1 tablet (25 mg total) by mouth daily.    sodium zirconium cyclosilicate  (LOKELMA ) 10 g PACK packet Take 10 g by mouth. One every morning on M,W,F for Hyperkalemia. (Patient not taking: Reported on 11/24/2023)    traMADol  (ULTRAM ) 50 MG tablet Take 1 tablet (50 mg total) by mouth 2 (two) times daily as needed for moderate pain. (Patient not taking: Reported on 11/24/2023)    No facility-administered encounter medications on file as of 11/24/2023.    ALLERGIES:  Allergies  Allergen Reactions   Cefepime  Other (See Comments)    Other Reaction(s): Flushing  Myoclonus    LABORATORY DATA:  I have reviewed the labs as listed.  CBC    Component Value Date/Time   WBC 5.7 11/24/2023 1223   RBC 3.67 (L) 11/24/2023 1223   HGB 11.0 (L) 11/24/2023 1223   HGB 14.1 04/30/2017 1538   HCT 33.0 (L) 11/24/2023 1223   HCT 41.9 04/30/2017 1538   PLT 184 11/24/2023 1223   PLT 249 04/30/2017 1538   MCV 89.9 11/24/2023 1223   MCV 88 04/30/2017 1538   MCH 30.0 11/24/2023 1223   MCHC 33.3 11/24/2023 1223   RDW 13.2 11/24/2023 1223   RDW 13.7 04/30/2017 1538   LYMPHSABS 1.1 11/24/2023 1223   MONOABS 0.4 11/24/2023 1223   EOSABS 0.2  11/24/2023 1223   BASOSABS 0.0 11/24/2023 1223      Latest Ref Rng & Units 11/24/2023   12:23 PM 08/25/2023    8:47 AM 07/25/2023    9:37 AM  CMP  Glucose 70 - 99 mg/dL 161  096  045   BUN 6 - 20 mg/dL 48  55  44   Creatinine 0.61 - 1.24 mg/dL 4.09  8.11  9.14   Sodium 135 - 145 mmol/L 138  137  138   Potassium 3.5 - 5.1 mmol/L 4.2  4.2  4.1   Chloride 98 - 111 mmol/L 104  100  105   CO2 22 - 32 mmol/L 26  27  22    Calcium  8.9 - 10.3 mg/dL 8.4  9.0  8.4   Total Protein 6.5 - 8.1 g/dL 6.4     Total Bilirubin 0.0 - 1.2 mg/dL 0.8     Alkaline Phos 38 - 126 U/L 157     AST 15 - 41 U/L 19     ALT 0 - 44 U/L 18       DIAGNOSTIC IMAGING:  I have independently reviewed the relevant imaging and discussed with the patient.   WRAP UP:  All questions were answered. The patient knows to call the clinic with any problems, questions or concerns.  Medical decision making: Low  Time spent on visit: I spent 15 minutes counseling the patient face to face. The total time spent in the appointment was 22 minutes and more than 50% was on counseling.  Sonnie Dusky, PA-C  11/24/23 2:50 PM

## 2023-11-24 ENCOUNTER — Inpatient Hospital Stay: Payer: Medicare Other | Attending: Physician Assistant | Admitting: Physician Assistant

## 2023-11-24 ENCOUNTER — Inpatient Hospital Stay: Payer: Medicare Other

## 2023-11-24 VITALS — BP 150/72 | HR 70 | Temp 97.9°F | Resp 16 | Wt 271.4 lb

## 2023-11-24 DIAGNOSIS — N184 Chronic kidney disease, stage 4 (severe): Secondary | ICD-10-CM | POA: Diagnosis not present

## 2023-11-24 DIAGNOSIS — N189 Chronic kidney disease, unspecified: Secondary | ICD-10-CM

## 2023-11-24 DIAGNOSIS — D631 Anemia in chronic kidney disease: Secondary | ICD-10-CM

## 2023-11-24 DIAGNOSIS — N182 Chronic kidney disease, stage 2 (mild): Secondary | ICD-10-CM | POA: Insufficient documentation

## 2023-11-24 DIAGNOSIS — I130 Hypertensive heart and chronic kidney disease with heart failure and stage 1 through stage 4 chronic kidney disease, or unspecified chronic kidney disease: Secondary | ICD-10-CM | POA: Insufficient documentation

## 2023-11-24 DIAGNOSIS — D649 Anemia, unspecified: Secondary | ICD-10-CM | POA: Diagnosis not present

## 2023-11-24 LAB — IRON AND TIBC
Iron: 76 ug/dL (ref 45–182)
Saturation Ratios: 29 % (ref 17.9–39.5)
TIBC: 262 ug/dL (ref 250–450)
UIBC: 186 ug/dL

## 2023-11-24 LAB — CBC WITH DIFFERENTIAL/PLATELET
Abs Immature Granulocytes: 0.02 10*3/uL (ref 0.00–0.07)
Basophils Absolute: 0 10*3/uL (ref 0.0–0.1)
Basophils Relative: 0 %
Eosinophils Absolute: 0.2 10*3/uL (ref 0.0–0.5)
Eosinophils Relative: 4 %
HCT: 33 % — ABNORMAL LOW (ref 39.0–52.0)
Hemoglobin: 11 g/dL — ABNORMAL LOW (ref 13.0–17.0)
Immature Granulocytes: 0 %
Lymphocytes Relative: 19 %
Lymphs Abs: 1.1 10*3/uL (ref 0.7–4.0)
MCH: 30 pg (ref 26.0–34.0)
MCHC: 33.3 g/dL (ref 30.0–36.0)
MCV: 89.9 fL (ref 80.0–100.0)
Monocytes Absolute: 0.4 10*3/uL (ref 0.1–1.0)
Monocytes Relative: 7 %
Neutro Abs: 4 10*3/uL (ref 1.7–7.7)
Neutrophils Relative %: 70 %
Platelets: 184 10*3/uL (ref 150–400)
RBC: 3.67 MIL/uL — ABNORMAL LOW (ref 4.22–5.81)
RDW: 13.2 % (ref 11.5–15.5)
WBC: 5.7 10*3/uL (ref 4.0–10.5)
nRBC: 0 % (ref 0.0–0.2)

## 2023-11-24 LAB — COMPREHENSIVE METABOLIC PANEL WITH GFR
ALT: 18 U/L (ref 0–44)
AST: 19 U/L (ref 15–41)
Albumin: 3.5 g/dL (ref 3.5–5.0)
Alkaline Phosphatase: 157 U/L — ABNORMAL HIGH (ref 38–126)
Anion gap: 8 (ref 5–15)
BUN: 48 mg/dL — ABNORMAL HIGH (ref 6–20)
CO2: 26 mmol/L (ref 22–32)
Calcium: 8.4 mg/dL — ABNORMAL LOW (ref 8.9–10.3)
Chloride: 104 mmol/L (ref 98–111)
Creatinine, Ser: 2.8 mg/dL — ABNORMAL HIGH (ref 0.61–1.24)
GFR, Estimated: 25 mL/min — ABNORMAL LOW (ref >60)
Glucose, Bld: 166 mg/dL — ABNORMAL HIGH (ref 70–99)
Potassium: 4.2 mmol/L (ref 3.5–5.1)
Sodium: 138 mmol/L (ref 135–145)
Total Bilirubin: 0.8 mg/dL (ref 0.0–1.2)
Total Protein: 6.4 g/dL — ABNORMAL LOW (ref 6.5–8.1)

## 2023-11-24 LAB — FERRITIN: Ferritin: 251 ng/mL (ref 24–336)

## 2023-11-24 NOTE — Patient Instructions (Signed)
 [facility] at [facility] **VISIT SUMMARY & IMPORTANT INSTRUCTIONS **   You were seen today by Sheril Dines PA-C for your anemia (low red blood count / hemoglobin). Your anemia is related to your chronic kidney disease and iron deficiency. Your iron levels look great.  You do not need any IV iron at this time.  Continue taking daily iron tablet. Your blood counts are currently adequate.  (Our goal is for your hemoglobin to be higher than 10). We will check labs and see you for office visit again in 4 months. If your hemoglobin is persistently <10.0 despite normal iron levels, we would consider restarting you on Retacrit /Aranesp  injections in the future.   FOLLOW-UP APPOINTMENT: Labs and office visit in 4 months  ** Thank you for trusting me with your healthcare!  I strive to provide all of my patients with quality care at each visit.  If you receive a survey for this visit, I would be so grateful to you for taking the time to provide feedback.  Thank you in advance!  ~ Kyliegh Jester                   Dr. Paulett Boros   &   Sheril Dines, PA-C   - - - - - - - - - - - - - - - - - -    Thank you for choosing [facility] at [facility] to provide your oncology and hematology care.  To afford each patient quality time with our provider, please arrive at least 15 minutes before your scheduled appointment time.   If you have a lab appointment with the Cancer Center please come in thru the Main Entrance and check in at the main information desk.  You need to re-schedule your appointment should you arrive 10 or more minutes late.  We strive to give you quality time with our providers, and arriving late affects you and other patients whose appointments are after yours.  Also, if you no show three or more times for appointments you may be dismissed from the clinic at the providers discretion.     Again, thank you for choosing [facility].  Our hope is that these requests will decrease the amount of time that you wait before being seen by our physicians.       _____________________________________________________________  Should you have questions after your visit to [facility], please contact our office at [phone] and follow the prompts.  Our office hours are 8:00 a.m. and 4:30 p.m. Monday - Friday.  Please note that voicemails left after 4:00 p.m. may not be returned until the following business day.  We are closed weekends and major holidays.  You do have access to a nurse 24-7, just call the main number to the clinic [phone] and do not press any options, hold on the line and a nurse will answer the phone.    For prescription refill requests, have your pharmacy contact our office and allow 72 hours.

## 2024-01-19 ENCOUNTER — Telehealth: Payer: Self-pay

## 2024-01-19 NOTE — Telephone Encounter (Signed)
 Returned Oscar Rose's call from [facility] regarding scheduling an appt for pt due to RLE pain/cramping when ambulating. Pt has been scheduled for f/u with APP & studies.

## 2024-02-17 ENCOUNTER — Ambulatory Visit (HOSPITAL_COMMUNITY)

## 2024-02-19 ENCOUNTER — Ambulatory Visit

## 2024-03-31 ENCOUNTER — Inpatient Hospital Stay: Admitting: Physician Assistant

## 2024-03-31 ENCOUNTER — Inpatient Hospital Stay

## 2024-04-07 ENCOUNTER — Ambulatory Visit (HOSPITAL_BASED_OUTPATIENT_CLINIC_OR_DEPARTMENT_OTHER)
Admission: RE | Admit: 2024-04-07 | Discharge: 2024-04-07 | Discharge status: Home / Self Care | Source: Ambulatory Visit | Attending: Vascular Surgery

## 2024-04-07 ENCOUNTER — Ambulatory Visit (HOSPITAL_COMMUNITY)
Admission: RE | Admit: 2024-04-07 | Discharge: 2024-04-07 | Disposition: A | Discharge status: Home / Self Care | Source: Ambulatory Visit | Attending: Vascular Surgery | Admitting: Vascular Surgery

## 2024-04-07 ENCOUNTER — Ambulatory Visit (INDEPENDENT_AMBULATORY_CARE_PROVIDER_SITE_OTHER): Attending: Vascular Surgery | Admitting: Physician Assistant

## 2024-04-07 VITALS — BP 122/65 | HR 68 | Temp 97.7°F | Wt 254.8 lb

## 2024-04-07 DIAGNOSIS — I739 Peripheral vascular disease, unspecified: Secondary | ICD-10-CM

## 2024-04-07 NOTE — Progress Notes (Signed)
 Office Note   History of Present Illness   Oscar Rose is a 63 y.o. (30-Sep-1960) male who presents for follow-up.  He has a history of left lower extremity angiogram with left anterior tibial artery angioplasty on 11/08/2021 by Dr. Gretta.  This was done for critical limb ischemia with a nonhealing left TMA.  He has required several skin grafts and debridements to the left TMA.  This is now healed.  Most recently he underwent right lower extremity angiogram with right TP trunk and posterior tibial artery angioplasty on 11/07/2022 by Dr. Gretta.  This was done for critical limb ischemia with a right second toe ulceration.  He subsequently underwent right second toe amputation, which is also healed.  He returns today for follow-up.  He currently resides at [facility].  They called our clinic to report that the patient was having cramping in his right leg with walking back in July.  At today's visit he has no lower extremity complaints.  He does not remember any cramping pain in his legs a few months ago.  He currently denies any claudication, rest pain, or tissue loss.  His biggest complaint is issues with nearly constant dizziness, which is being followed by ENT.  Current Outpatient Medications  Medication Sig Dispense Refill   acetaminophen  (TYLENOL ) 650 MG CR tablet Take 650 mg by mouth every 4 (four) hours as needed for pain.     albuterol  (VENTOLIN  HFA) 108 (90 Base) MCG/ACT inhaler Inhale into the lungs. (Patient not taking: Reported on 11/24/2023)     Ascorbic Acid (VITAMIN C PO) Take 500 mg by mouth 2 (two) times daily.     aspirin  EC 81 MG tablet Take 81 mg by mouth daily.     atorvastatin  (LIPITOR) 40 MG tablet Take 40 mg by mouth at bedtime.     Baclofen  5 MG TABS Take 1 tablet (5 mg total) by mouth 3 (three) times daily as needed (muscle spasm). (Patient taking differently: Take 5 mg by mouth every 8 (eight) hours as needed (muscle spasm).) 90 tablet    busPIRone  (BUSPAR ) 5  MG tablet Take 5 mg by mouth 2 (two) times daily.     cloNIDine  (CATAPRES  - DOSED IN MG/24 HR) 0.3 mg/24hr patch Place 1 patch (0.3 mg total) onto the skin once a week. (Patient taking differently: Place 0.3 mg onto the skin every Friday.) 4 patch 12   clopidogrel  (PLAVIX ) 75 MG tablet Take 75 mg by mouth daily.     ferrous sulfate  325 (65 FE) MG tablet Take 325 mg by mouth daily with breakfast.     furosemide  (LASIX ) 40 MG tablet Take 1 tablet (40 mg total) by mouth daily. 30 tablet 1   GLUCAGON, RDNA, IJ Inject as directed. Prn  Hypoglycemia (Patient not taking: Reported on 11/24/2023)     guaiFENesin  (ROBITUSSIN) 100 MG/5ML liquid Take 5 mLs by mouth every 4 (four) hours as needed for cough or to loosen phlegm. (Patient not taking: Reported on 11/24/2023)     hydrALAZINE  (APRESOLINE ) 50 MG tablet Take 50 mg by mouth.     insulin  glargine-yfgn (SEMGLEE ) 100 UNIT/ML Pen      ipratropium-albuterol  (DUONEB) 0.5-2.5 (3) MG/3ML SOLN      labetalol  (NORMODYNE ) 200 MG tablet Take 1 tablet (200 mg total) by mouth 2 (two) times daily. 60 tablet 0   loperamide (IMODIUM A-D) 2 MG tablet Take 2 mg by mouth 4 (four) times daily as needed for diarrhea or  loose stools. 2 caplets initially, then 1 caplet following each loose stool.Do not exceed 4 caplets in a 24 hour period. (Patient not taking: Reported on 11/24/2023)     meclizine (ANTIVERT) 25 MG tablet Take 25 mg by mouth every 8 (eight) hours.     mineral oil-hydrophilic petrolatum (AQUAPHOR) ointment Apply 1 Application topically at bedtime.     Multiple Vitamin (MULTIVITAMIN) tablet Take 1 tablet by mouth daily.     ondansetron  (ZOFRAN -ODT) 4 MG disintegrating tablet Take 4 mg by mouth every 4 (four) hours as needed for nausea or vomiting. If not resolved after two doses then Phenergan  25 mg Q 6 hours prn. (Patient not taking: Reported on 11/24/2023)     OVER THE COUNTER MEDICATION Fleets enema x 1, if no results from above call the MD  Mylanta 30 cc po Q 2  hours prn acid indigestion. Do not exceed 4 doses in 24 hours.  Pro stat 64 Oral liquid 30 ml in the am's for wound healing. (Patient not taking: Reported on 11/24/2023)     pantoprazole  (PROTONIX ) 40 MG tablet Take 1 tablet (40 mg total) by mouth daily. 90 tablet 3   SANTYL 250 UNIT/GM ointment Apply topically. (Patient not taking: Reported on 11/24/2023)     sertraline  (ZOLOFT ) 25 MG tablet Take 1 tablet (25 mg total) by mouth daily. 30 tablet 0   sodium zirconium cyclosilicate  (LOKELMA ) 10 g PACK packet Take 10 g by mouth. One every morning on M,W,F for Hyperkalemia. (Patient not taking: Reported on 11/24/2023)     traMADol  (ULTRAM ) 50 MG tablet Take 1 tablet (50 mg total) by mouth 2 (two) times daily as needed for moderate pain. (Patient not taking: Reported on 11/24/2023) 10 tablet 0   VELTASSA 8.4 g packet Take 1 packet by mouth daily.     No current facility-administered medications for this visit.    REVIEW OF SYSTEMS (negative unless checked):   Cardiac:  []  Chest pain or chest pressure? []  Shortness of breath upon activity? []  Shortness of breath when lying flat? []  Irregular heart rhythm?  Vascular:  []  Pain in calf, thigh, or hip brought on by walking? []  Pain in feet at night that wakes you up from your sleep? []  Blood clot in your veins? []  Leg swelling?  Pulmonary:  []  Oxygen at home? []  Productive cough? []  Wheezing?  Neurologic:  []  Sudden weakness in arms or legs? []  Sudden numbness in arms or legs? []  Sudden onset of difficult speaking or slurred speech? []  Temporary loss of vision in one eye? [x]  Problems with dizziness?  Gastrointestinal:  []  Blood in stool? []  Vomited blood?  Genitourinary:  []  Burning when urinating? []  Blood in urine?  Psychiatric:  []  Major depression  Hematologic:  []  Bleeding problems? []  Problems with blood clotting?  Dermatologic:  []  Rashes or ulcers?  Constitutional:  []  Fever or chills?  Ear/Nose/Throat:  []   Change in hearing? []  Nose bleeds? []  Sore throat?  Musculoskeletal:  []  Back pain? []  Joint pain? []  Muscle pain?   Physical Examination   Vitals:   04/07/24 1017  BP: 122/65  Pulse: 68  Temp: 97.7 F (36.5 C)  TempSrc: Temporal  Weight: 254 lb 12.8 oz (115.6 kg)   Body mass index is 36.04 kg/m.  General:  WDWN in NAD; vital signs documented above Gait: Not observed HENT: WNL, normocephalic Pulmonary: normal non-labored breathing , without rales, rhonchi,  wheezing Cardiac: regular Abdomen: soft, NT, no masses Skin: without rashes Vascular  Exam/Pulses: Brisk right DP/PT Doppler signals.  Brisk left PT Doppler signal Extremities: Well-healed right second toe ulceration and left TMA Musculoskeletal: no muscle wasting or atrophy  Neurologic: A&O X 3;  No focal weakness or paresthesias are detected Psychiatric:  The pt has Normal affect.  Non-Invasive Vascular imaging   ABI (04/07/2024) R:  ABI: 0.97 (0.85),  PT: bi DP: mono TBI: Amputation L:  ABI: 1.03 (0.87),  PT: mono DP: tri TBI: Amputation   RLE Arterial Duplex (04/07/2024) Patent arterial flow in the right lower extremity without hemodynamically significant stenosis  Medical Decision Making   Oscar Rose is a 63 y.o. male who presents for surveillance of PAD  Based on the patient's vascular studies, his ABIs appear improved bilaterally.  His right ABI is 0.97 and left ABI is 1.03 Arterial duplex of the right lower extremity demonstrates patent arterial flow without hemodynamically significant stenosis The patient was originally scheduled with our office to discuss issues with right lower extremity cramping with walking back in July.  The patient does not recall any of the symptoms.  He currently denies any claudication, rest pain, or tissue loss.  His previous right second toe amputation and left TMA have healed. On exam he has a brisk left PT Doppler signal and right DP/PT Doppler signals He can  follow-up with our office in 1 year with repeat right lower extremity arterial duplex and ABIs   Ahmed Holster PA-C [facility] Office: [phone]  Clinic MD: Sheree

## 2024-04-12 ENCOUNTER — Ambulatory Visit: Admitting: Physician Assistant

## 2024-04-12 ENCOUNTER — Inpatient Hospital Stay

## 2024-04-26 ENCOUNTER — Telehealth: Payer: Self-pay | Admitting: *Deleted

## 2024-04-26 ENCOUNTER — Inpatient Hospital Stay: Attending: Physician Assistant

## 2024-04-26 ENCOUNTER — Inpatient Hospital Stay (HOSPITAL_BASED_OUTPATIENT_CLINIC_OR_DEPARTMENT_OTHER): Admitting: Physician Assistant

## 2024-04-26 VITALS — BP 100/55 | HR 63 | Temp 96.4°F | Resp 20 | Wt 252.2 lb

## 2024-04-26 DIAGNOSIS — Z7902 Long term (current) use of antithrombotics/antiplatelets: Secondary | ICD-10-CM | POA: Insufficient documentation

## 2024-04-26 DIAGNOSIS — D631 Anemia in chronic kidney disease: Secondary | ICD-10-CM | POA: Diagnosis not present

## 2024-04-26 DIAGNOSIS — Z7982 Long term (current) use of aspirin: Secondary | ICD-10-CM | POA: Diagnosis not present

## 2024-04-26 DIAGNOSIS — N189 Chronic kidney disease, unspecified: Secondary | ICD-10-CM

## 2024-04-26 DIAGNOSIS — E611 Iron deficiency: Secondary | ICD-10-CM | POA: Insufficient documentation

## 2024-04-26 DIAGNOSIS — N184 Chronic kidney disease, stage 4 (severe): Secondary | ICD-10-CM | POA: Diagnosis not present

## 2024-04-26 DIAGNOSIS — Z79899 Other long term (current) drug therapy: Secondary | ICD-10-CM | POA: Diagnosis not present

## 2024-04-26 DIAGNOSIS — I959 Hypotension, unspecified: Secondary | ICD-10-CM

## 2024-04-26 DIAGNOSIS — R197 Diarrhea, unspecified: Secondary | ICD-10-CM

## 2024-04-26 DIAGNOSIS — E1122 Type 2 diabetes mellitus with diabetic chronic kidney disease: Secondary | ICD-10-CM | POA: Diagnosis not present

## 2024-04-26 DIAGNOSIS — I129 Hypertensive chronic kidney disease with stage 1 through stage 4 chronic kidney disease, or unspecified chronic kidney disease: Secondary | ICD-10-CM | POA: Insufficient documentation

## 2024-04-26 DIAGNOSIS — D649 Anemia, unspecified: Secondary | ICD-10-CM

## 2024-04-26 LAB — CBC WITH DIFFERENTIAL/PLATELET
Abs Immature Granulocytes: 0.03 K/uL (ref 0.00–0.07)
Basophils Absolute: 0 K/uL (ref 0.0–0.1)
Basophils Relative: 1 %
Eosinophils Absolute: 0.2 K/uL (ref 0.0–0.5)
Eosinophils Relative: 2 %
HCT: 36.7 % — ABNORMAL LOW (ref 39.0–52.0)
Hemoglobin: 12.3 g/dL — ABNORMAL LOW (ref 13.0–17.0)
Immature Granulocytes: 0 %
Lymphocytes Relative: 14 %
Lymphs Abs: 1.1 K/uL (ref 0.7–4.0)
MCH: 30.2 pg (ref 26.0–34.0)
MCHC: 33.5 g/dL (ref 30.0–36.0)
MCV: 90.2 fL (ref 80.0–100.0)
Monocytes Absolute: 0.4 K/uL (ref 0.1–1.0)
Monocytes Relative: 5 %
Neutro Abs: 6.1 K/uL (ref 1.7–7.7)
Neutrophils Relative %: 78 %
Platelets: 228 K/uL (ref 150–400)
RBC: 4.07 MIL/uL — ABNORMAL LOW (ref 4.22–5.81)
RDW: 12.1 % (ref 11.5–15.5)
WBC: 7.8 K/uL (ref 4.0–10.5)
nRBC: 0 % (ref 0.0–0.2)

## 2024-04-26 LAB — IRON AND TIBC
Iron: 66 ug/dL (ref 45–182)
Saturation Ratios: 26 % (ref 17.9–39.5)
TIBC: 255 ug/dL (ref 250–450)
UIBC: 189 ug/dL

## 2024-04-26 LAB — COMPREHENSIVE METABOLIC PANEL WITH GFR
ALT: 17 U/L (ref 0–44)
AST: 17 U/L (ref 15–41)
Albumin: 3.8 g/dL (ref 3.5–5.0)
Alkaline Phosphatase: 142 U/L — ABNORMAL HIGH (ref 38–126)
Anion gap: 14 (ref 5–15)
BUN: 53 mg/dL — ABNORMAL HIGH (ref 8–23)
CO2: 20 mmol/L — ABNORMAL LOW (ref 22–32)
Calcium: 8.7 mg/dL — ABNORMAL LOW (ref 8.9–10.3)
Chloride: 103 mmol/L (ref 98–111)
Creatinine, Ser: 3.3 mg/dL — ABNORMAL HIGH (ref 0.61–1.24)
GFR, Estimated: 20 mL/min — ABNORMAL LOW (ref >60)
Glucose, Bld: 180 mg/dL — ABNORMAL HIGH (ref 70–99)
Potassium: 4.4 mmol/L (ref 3.5–5.1)
Sodium: 137 mmol/L (ref 135–145)
Total Bilirubin: 0.8 mg/dL (ref 0.0–1.2)
Total Protein: 6.8 g/dL (ref 6.5–8.1)

## 2024-04-26 LAB — FERRITIN: Ferritin: 317 ng/mL (ref 24–336)

## 2024-04-26 NOTE — Telephone Encounter (Signed)
-----   Message from [place] [employer] sent at 04/26/2024  1:50 PM EDT ----- Can you request a follow up with his [facility] cardiology team since his BP was low and patient is complaining of dizziness.   Thanks, Johnston

## 2024-04-26 NOTE — Progress Notes (Signed)
 [facility] [address], [place] [address]   CLINIC:  Medical Oncology/Hematology  PCP:  Wilhelmina Cedar [address] 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 63 y.o. 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  He was last seen by Pleasant Barefoot PA-C on 11/24/2023.  At today's visit, he reports having ongoing fatigue.  He adds that he has been having 2-3 episodes of diarrhea per day.  He takes Imodium with some improvement.  He denies easy bruising or signs of active bleeding.  Patient has noticed ongoing headaches and dizziness.  He adds that his blood pressure was low today and is worried that is contributing to his dizziness.  He is compliant with taking his daily iron, Vitamin C, and multivitamin.  He denies fevers, chills, night sweats, shortness of breath, chest pain or cough.  ASSESSMENT & PLAN:  1.   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 Rachele. - Received 1 unit PRBC in March 2024 - Patient initiated on Retacrit , received 1 dose 3000 units on 12/16/2022.  Has not required any Retacrit  after first dose in May 2024. - He has never required Aranesp  or other ESA injections. - Last Feraheme was on 11/11/2022. - Current supplements include daily iron, Vitamin C, and multivitamin. - Colonoscopy (03/11/2011): Nonbleeding internal hemorrhoids.  Tubular adenoma in the cecum and sessile serrated polyp removed. - EGD (03/11/2023): Normal esophagus, erosive gastropathy, nonbleeding gastric ulcer, biopsied was benign. - Labs from 08/25/2023 showed CKD stage IV.  Normal folate.  Iron levels at goal. MMA mildly elevated at 382.  Normal B12 449. SPEP negative for M spike.  Minimally elevated FLC ratio 2.37 (likely due to CKD  stage IV)  - Reports baseline/mild fatigue.   No ice pica. - No overt signs of bleeding including BRBPR/melena. - Labs today (04/26/2024) showed mild anemia with Hgb 12.3, MCV 90.2. Iron panel shows no deficiency with serum rion 66, TIB 255, saturation 26%, ferritin 317.  - PLAN:   --No need for IV iron or Epo injections at this time. --Will forward labs to Dr. Rachele as creatinine as increased to 3.30. Patient reports he has a follow up next month.   2.  Hypotension: -- BP today was 100/55.  -- Secondary to diarrhea +/- antihypertensive medications -- Advised to follow up with cardiology team to reassess antihypertensive medication -- Offered IV fluids but patient declined. Encouraged to drink 1-2 L of water per day   3. Diarrhea:  -- Advised to take imodium 2 tablets with first episode of diarrhea and then 1 tablet with subsequent episodes, taxing max dose of 16 mg per day.   4. 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 Worked in Clinical biochemist. No chemical exposure. No tobacco use. No family history of anemia. Mother had throat cancer.     PLAN SUMMARY:  >> Same-day labs (CBC/D, CMP, ferritin, iron/TIBC) + OFFICE visit in 4 months     REVIEW OF SYSTEMS:  Review of Systems  Constitutional:  Positive for fatigue (mild). Negative for appetite change, chills, diaphoresis, fever and unexpected weight change.  HENT:   Negative for lump/mass  and nosebleeds.   Eyes:  Negative for eye problems.  Respiratory:  Negative for cough, hemoptysis and shortness of breath.   Cardiovascular:  Negative for chest pain (at times, none today), leg swelling and palpitations.  Gastrointestinal:  Positive for diarrhea. Negative for abdominal pain, blood in stool, constipation, nausea and vomiting.  Genitourinary:  Negative for hematuria.   Musculoskeletal:  Positive for arthralgias  and myalgias.  Skin: Negative.   Neurological:  Positive for dizziness, headaches and numbness. Negative for light-headedness.  Hematological:  Does not bruise/bleed easily.     PHYSICAL EXAM:  ECOG PERFORMANCE STATUS: 1 - Symptomatic but completely ambulatory  Vitals:   04/26/24 1050  BP: (!) 100/55  Pulse: 63  Resp: 20  Temp: (!) 96.4 F (35.8 C)  SpO2: 100%    Filed Weights   04/26/24 1050  Weight: 252 lb 3.3 oz (114.4 kg)    Physical Exam Constitutional:      Appearance: Normal appearance. He is obese.  Cardiovascular:     Heart sounds: Normal heart sounds.  Pulmonary:     Breath sounds: Normal breath sounds.  Neurological:     General: No focal deficit present.     Mental Status: Mental status is at baseline.  Psychiatric:        Behavior: Behavior normal. Behavior is cooperative.     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  STROKE  JULY  2017   Tinea unguium    Type 2 diabetes mellitus (HCC)    Vitamin D deficiency    Past Surgical History:  Procedure Laterality Date   ABDOMINAL AORTOGRAM W/LOWER EXTREMITY N/A 11/08/2021   Procedure: ABDOMINAL AORTOGRAM W/LOWER EXTREMITY;  Surgeon: Gretta Lonni PARAS, MD;  [facility];  Service: Cardiovascular;  Laterality: N/A;   ABDOMINAL AORTOGRAM W/LOWER EXTREMITY Right 11/07/2022   Procedure: ABDOMINAL AORTOGRAM W/LOWER EXTREMITY;  Surgeon: Gretta Lonni PARAS, MD;  Location: [facility];  Service: Cardiovascular;  Laterality: Right;   AMPUTATION Bilateral 2012   Left big toe partial and right big toe complete   BIOPSY  03/11/2023   Procedure: BIOPSY;  Surgeon: Eartha Flavors, Toribio, MD;  Location: [facility];  Service: Gastroenterology;;  upper and lower bx's   CARDIOVASCULAR STRESS TEST  04-05-2014  dr croitoru   low risk lexiscan  nuclear study with mild diaphragmatic attenuation artifact/  no ischemia/  ef 58%   CATARACT EXTRACTION W/ INTRAOCULAR LENS  IMPLANT, BILATERAL  2013  COLONOSCOPY WITH PROPOFOL  N/A 03/11/2023   Procedure: COLONOSCOPY WITH PROPOFOL ;  Surgeon: Eartha Angelia Sieving, MD;  [facility]: [facility];  Service: Gastroenterology;  Laterality: N/A;  11:45 AM;ASA 3   CYSTO /  LEFT URETERAL STENT PLACEMENT  2012   CYSTOSCOPY W/ URETERAL STENT REMOVAL Left 07/07/2014   Procedure: CYSTO WITH LEFT PORTION STENT REMOVAL;  Surgeon: Norleen JINNY Seltzer, MD;  Location[facility];  Service: Urology;  Laterality: Left;   CYSTOSCOPY WITH URETEROSCOPY AND STENT PLACEMENT Left 07/07/2014   Procedure: CYSTOLITHALOPAXY URETEROSCOPY WITH STENT;  Surgeon: Norleen JINNY Seltzer, MD;  Location: [facility];  Service: Urology;  Laterality: Left;   ENDARTERECTOMY Right 04/04/2016   Procedure: ENDARTERECTOMY CAROTID ARTERY RIGHT;  Surgeon: Lonni GORMAN Blade, MD;  Location: [facility] OR;  Service: Vascular;  Laterality: Right;   ESOPHAGOGASTRODUODENOSCOPY (EGD) WITH PROPOFOL  N/A 03/11/2023   Procedure: ESOPHAGOGASTRODUODENOSCOPY (EGD) WITH PROPOFOL ;  Surgeon: Eartha Angelia Sieving, MD;  [facility];  Service: Gastroenterology;  Laterality: N/A;  11:45 AM;ASA 3   HOLMIUM LASER APPLICATION  N/A 07/07/2014   Procedure: HOLMIUM LASER APPLICATION;  Surgeon: Norleen JINNY Seltzer, MD;  Location: [facility];  Service: Urology;  Laterality: N/A;   I & D  INFECTED SPIDER BITE UPPER BACK  06/ 2012   NEPHROLITHOTOMY Left 08/16/2014   Procedure: LEFT NEPHROLITHOTOMY PERCUTANEOUS;  Surgeon: Norleen JINNY Seltzer, MD;  Location: [facility];  Service: Urology;  Laterality: Left;   NEPHROLITHOTOMY Left 08/18/2014   Procedure: LEFT NEPHROLITHOTOMY PERCUTANEOUS SECOND LOOK;  Surgeon: Norleen JINNY Seltzer, MD;  Location: [facility];  Service: Urology;  Laterality: Left;   PATCH ANGIOPLASTY Right 04/04/2016   Procedure: PATCH ANGIOPLASTY RIGHT CAROTID ARTERY;  Surgeon: Lonni GORMAN Blade, MD;  Location: [facility] OR;  Service: Vascular;  Laterality: Right;   PERIPHERAL VASCULAR BALLOON ANGIOPLASTY  11/08/2021   Procedure: PERIPHERAL VASCULAR BALLOON ANGIOPLASTY;  Surgeon: Gretta Lonni JINNY, MD;  [facility];  Service: Cardiovascular;;  Left AT   PERIPHERAL VASCULAR BALLOON ANGIOPLASTY Right 11/07/2022   Procedure: PERIPHERAL VASCULAR BALLOON ANGIOPLASTY;  Surgeon: Gretta Lonni JINNY, MD;  [facility];  Service: Cardiovascular;  Laterality: Right;  right TP trunk and PT   POLYPECTOMY  03/11/2023   Procedure: POLYPECTOMY;  Surgeon: Eartha Angelia Sieving, MD;  [facility];  Service: Gastroenterology;;   RETINAL DETACHMENT SURGERY Bilateral 2013   TONSILLECTOMY AND ADENOIDECTOMY  as child    SOCIAL HISTORY:  Social History   Socioeconomic History   Marital status: Single    Spouse name: Not on file   Number of children: 0   Years of education: Not on file   Highest education level: Not on file  Occupational History   Not on file  Tobacco Use   Smoking status: Never   Smokeless tobacco: Never  Vaping Use   Vaping status: Never Used  Substance and Sexual Activity   Alcohol use: Not Currently   Drug use: No   Sexual activity: Not on file  Other Topics Concern   Not  on file  Social History Narrative   Lives with wife      Update 11/05/2022   Lives at [facility] rehab currently   Right handed   Caffeine: none    Social Drivers of Health   Financial Resource Strain: Low Risk  (09/05/2021)   Received from [facility]   Overall Financial Resource Strain (CARDIA)    Difficulty of Paying Living Expenses:  Not hard at all  Food Insecurity: Low Risk  (10/10/2023)   Received from [facility]   Hunger Vital Sign    Within the past 12 months, you worried that your food would run out before you got money to buy more: Never true    Within the past 12 months, the food you bought just didn't last and you didn't have money to get more. : Never true  Transportation Needs: No Transportation Needs (10/10/2023)   Received from [facility]    In the past 12 months, has lack of reliable transportation kept you from medical appointments, meetings, work or from getting things needed for daily living? : No  Physical Activity: Inactive (09/05/2021)   Received from [facility]   Exercise Vital Sign    On average, how many days per week do you engage in moderate to strenuous exercise (like a brisk walk)?: 0 days    On average, how many minutes do you engage in exercise at this level?: 0 min  Stress: No Stress Concern Present (09/05/2021)   Received from [facility] of [facility] - Occupational Stress Questionnaire    Feeling of Stress : Not at all  Social Connections: Unknown (09/05/2021)   Received from [facility]   Social Connection and Isolation Panel    In a typical week, how many times do you talk on the phone with family, friends, or neighbors?: Once a week    How often do you get together with friends or relatives?: Once a week    How often do you attend church or religious services?: 1 to 4 times per year    Do you belong to any clubs or organizations such as church groups, unions, fraternal or athletic  groups, or school groups?: No    How often do you attend meetings of the clubs or organizations you belong to?: 1 to 4 times per year    Marital Status: Not on file  Intimate Partner Violence: Not At Risk (05/13/2022)   Humiliation, Afraid, Rape, and Kick questionnaire    Fear of Current or Ex-Partner: No    Emotionally Abused: No    Physically Abused: No    Sexually Abused: No    FAMILY HISTORY:  Family History  Problem Relation Age of Onset   Emphysema Mother    Cancer Mother        Throat   Heart disease Mother    Stroke Neg Hx     CURRENT MEDICATIONS:  Outpatient Encounter Medications as of 04/26/2024  Medication Sig Note   acetaminophen  (TYLENOL ) 650 MG CR tablet Take 650 mg by mouth every 4 (four) hours as needed for pain.    albuterol  (VENTOLIN  HFA) 108 (90 Base) MCG/ACT inhaler Inhale into the lungs.    Ascorbic Acid (VITAMIN C PO) Take 500 mg by mouth 2 (two) times daily.    aspirin  EC 81 MG tablet Take 81 mg by mouth daily.    atorvastatin  (LIPITOR) 40 MG tablet Take 40 mg by mouth at bedtime.    Baclofen  5 MG TABS Take 1 tablet (5 mg total) by mouth 3 (three) times daily as needed (muscle spasm).    busPIRone  (BUSPAR ) 5 MG tablet Take 5 mg by mouth 2 (two) times daily.    cloNIDine  (CATAPRES  - DOSED IN MG/24 HR) 0.3 mg/24hr patch Place 1 patch (0.3 mg total) onto the skin once a week. 03/11/2023: Left upper arm  clopidogrel  (PLAVIX ) 75 MG tablet Take 75 mg by mouth daily.    ferrous sulfate  325 (65 FE) MG tablet Take 325 mg by mouth daily with breakfast.    furosemide  (LASIX ) 40 MG tablet Take 1 tablet (40 mg total) by mouth daily.    hydrALAZINE  (APRESOLINE ) 50 MG tablet Take 50 mg by mouth.    insulin  glargine-yfgn (SEMGLEE ) 100 UNIT/ML Pen     ipratropium-albuterol  (DUONEB) 0.5-2.5 (3) MG/3ML SOLN     labetalol  (NORMODYNE ) 200 MG tablet Take 1 tablet (200 mg total) by mouth 2 (two) times daily. (Patient taking differently: Take 200 mg by mouth 3 (three) times daily.)     meclizine (ANTIVERT) 25 MG tablet Take 25 mg by mouth every 8 (eight) hours.    mineral oil-hydrophilic petrolatum (AQUAPHOR) ointment Apply 1 Application topically at bedtime.    Multiple Vitamin (MULTIVITAMIN) tablet Take 1 tablet by mouth daily.    pantoprazole  (PROTONIX ) 40 MG tablet Take 1 tablet (40 mg total) by mouth daily.    sertraline  (ZOLOFT ) 25 MG tablet Take 1 tablet (25 mg total) by mouth daily.    sodium bicarbonate 650 MG tablet Take 650 mg by mouth.    sodium zirconium cyclosilicate  (LOKELMA ) 10 g PACK packet Take 10 g by mouth. One every morning on M,W,F for Hyperkalemia.    traMADol  (ULTRAM ) 50 MG tablet Take 1 tablet (50 mg total) by mouth 2 (two) times daily as needed for moderate pain.    VELTASSA 8.4 g packet Take 1 packet by mouth daily.    [DISCONTINUED] traZODone  (DESYREL ) 50 MG tablet Take by mouth.    GLUCAGON, RDNA, IJ Inject as directed. Prn  Hypoglycemia (Patient not taking: Reported on 04/26/2024)    guaiFENesin  (ROBITUSSIN) 100 MG/5ML liquid Take 5 mLs by mouth every 4 (four) hours as needed for cough or to loosen phlegm. (Patient not taking: Reported on 04/26/2024)    loperamide (IMODIUM A-D) 2 MG tablet Take 2 mg by mouth 4 (four) times daily as needed for diarrhea or loose stools. 2 caplets initially, then 1 caplet following each loose stool.Do not exceed 4 caplets in a 24 hour period. (Patient not taking: Reported on 04/26/2024)    ondansetron  (ZOFRAN -ODT) 4 MG disintegrating tablet Take 4 mg by mouth every 4 (four) hours as needed for nausea or vomiting. If not resolved after two doses then Phenergan  25 mg Q 6 hours prn. (Patient not taking: Reported on 04/26/2024)    OVER THE COUNTER MEDICATION Fleets enema x 1, if no results from above call the MD  Mylanta 30 cc po Q 2 hours prn acid indigestion. Do not exceed 4 doses in 24 hours.  Pro stat 64 Oral liquid 30 ml in the am's for wound healing. (Patient not taking: Reported on 04/26/2024)    SANTYL 250 UNIT/GM  ointment Apply topically. (Patient not taking: Reported on 04/26/2024)    No facility-administered encounter medications on file as of 04/26/2024.    ALLERGIES:  Allergies  Allergen Reactions   Cefepime  Other (See Comments)    Other Reaction(s): Flushing  Myoclonus  Myoclonus    Other Reaction(s): Flushing  Myoclonus    LABORATORY DATA:  I have reviewed the labs as listed.  CBC    Component Value Date/Time   WBC 7.8 04/26/2024 0937   RBC 4.07 (L) 04/26/2024 0937   HGB 12.3 (L) 04/26/2024 0937   HGB 14.1 04/30/2017 1538   HCT 36.7 (L) 04/26/2024 0937   HCT 41.9 04/30/2017 1538  PLT 228 04/26/2024 0937   PLT 249 04/30/2017 1538   MCV 90.2 04/26/2024 0937   MCV 88 04/30/2017 1538   MCH 30.2 04/26/2024 0937   MCHC 33.5 04/26/2024 0937   RDW 12.1 04/26/2024 0937   RDW 13.7 04/30/2017 1538   LYMPHSABS 1.1 04/26/2024 0937   MONOABS 0.4 04/26/2024 0937   EOSABS 0.2 04/26/2024 0937   BASOSABS 0.0 04/26/2024 0937      Latest Ref Rng & Units 04/26/2024    9:37 AM 11/24/2023   12:23 PM 08/25/2023    8:47 AM  CMP  Glucose 70 - 99 mg/dL 819  833  832   BUN 8 - 23 mg/dL 53  48  55   Creatinine 0.61 - 1.24 mg/dL 6.69  7.19  6.84   Sodium 135 - 145 mmol/L 137  138  137   Potassium 3.5 - 5.1 mmol/L 4.4  4.2  4.2   Chloride 98 - 111 mmol/L 103  104  100   CO2 22 - 32 mmol/L 20  26  27    Calcium  8.9 - 10.3 mg/dL 8.7  8.4  9.0   Total Protein 6.5 - 8.1 g/dL 6.8  6.4    Total Bilirubin 0.0 - 1.2 mg/dL 0.8  0.8    Alkaline Phos 38 - 126 U/L 142  157    AST 15 - 41 U/L 17  19    ALT 0 - 44 U/L 17  18      WRAP UP:  All questions were answered. The patient knows to call the clinic with any problems, questions or concerns.   I have spent a total of 25 minutes minutes of face-to-face and non-face-to-face time, preparing to see the patient, performing a medically appropriate examination, counseling and educating the patient,  referring and communicating with other health care  professionals, documenting clinical information in the electronic health record, independently interpreting results and communicating results to the patient, and care coordination.    Johnston Police PA-C Dept of Hematology and Oncology [facility] at [facility]

## 2024-04-26 NOTE — Telephone Encounter (Signed)
 Spoke with Charlie Pouch, LPN for patient to relay normal lab results and recommendation to make appointment with Cardiology to follow up on hypotension and dizziness.

## 2024-05-24 ENCOUNTER — Other Ambulatory Visit: Payer: Self-pay

## 2024-05-24 DIAGNOSIS — I739 Peripheral vascular disease, unspecified: Secondary | ICD-10-CM

## 2024-06-07 NOTE — Progress Notes (Unsigned)
 Patient name: Oscar Rose MRN: [id number] DOB: 1960/10/06 Sex: male  REASON FOR CONSULT: Permanent access creation  HPI: Oscar Rose is a 63 y.o. male, with history of CKD stage IV, hypertension, hyperlipidemia, PVD, diabetes, carotid artery disease that presents for evaluation of permanent dialysis access.  Patient is well-known to vascular surgery and has had prior bilateral lower extremity tibial interventions for critical limb ischemia.  Past Medical History:  Diagnosis Date   Anemia    ARDS (adult respiratory distress syndrome) (HCC)    Arthritis    At risk for sleep apnea    STOP-BANG= 7    SENT TO PCP 06-29-2014   CKD (chronic kidney disease), stage II    montitored by nephrologist   Depression        Diabetic neuropathy (HCC)    GERD (gastroesophageal reflux disease)    Headache    SINUS   History of kidney stones    History of retinal detachment    Hyperlipidemia    Hypertension    Legal blindness of left eye, as defined in [place]    SECONDARY TO RETAINAL DETACHMENT   Neuropathy    PVD (peripheral vascular disease)    Restless leg syndrome    Retained ureteral stent    w/ encrustation SINCE 2012   Rotator cuff syndrome of right shoulder    Sleep apnea in adult    Stroke [facility])    ?  STROKE  JULY  2017   Tinea unguium    Type 2 diabetes mellitus (HCC)    Vitamin D deficiency     Past Surgical History:  Procedure Laterality Date   ABDOMINAL AORTOGRAM W/LOWER EXTREMITY N/A 11/08/2021   Procedure: ABDOMINAL AORTOGRAM W/LOWER EXTREMITY;  Surgeon: Gretta Lonni PARAS, MD;  [facility]: [facility];  Service: Cardiovascular;  Laterality: N/A;   ABDOMINAL AORTOGRAM W/LOWER EXTREMITY Right 11/07/2022   Procedure: ABDOMINAL AORTOGRAM W/LOWER EXTREMITY;  Surgeon: Gretta Lonni PARAS, MD;  Location: [facility];  Service: Cardiovascular;  Laterality: Right;   AMPUTATION Bilateral 2012   Left big toe partial and right big toe complete   BIOPSY   03/11/2023   Procedure: BIOPSY;  Surgeon: Eartha Flavors, Toribio, MD;  Location: [facility];  Service: Gastroenterology;;  upper and lower bx's   CARDIOVASCULAR STRESS TEST  04-05-2014  dr croitoru   low risk lexiscan  nuclear study with mild diaphragmatic attenuation artifact/  no ischemia/  ef 58%   CATARACT EXTRACTION W/ INTRAOCULAR LENS  IMPLANT, BILATERAL  2013   COLONOSCOPY WITH PROPOFOL  N/A 03/11/2023   Procedure: COLONOSCOPY WITH PROPOFOL ;  Surgeon: Eartha Flavors Toribio, MD;  [facility]: [facility];  Service: Gastroenterology;  Laterality: N/A;  11:45 AM;ASA 3   CYSTO /  LEFT URETERAL STENT PLACEMENT  2012   CYSTOSCOPY W/ URETERAL STENT REMOVAL Left 07/07/2014   Procedure: CYSTO WITH LEFT PORTION STENT REMOVAL;  Surgeon: Norleen PARAS Seltzer, MD;  Location: [facility];  Service: Urology;  Laterality: Left;   CYSTOSCOPY WITH URETEROSCOPY AND STENT PLACEMENT Left 07/07/2014   Procedure: CYSTOLITHALOPAXY URETEROSCOPY WITH STENT;  Surgeon: Norleen PARAS Seltzer, MD;  Location: [facility];  Service: Urology;  Laterality: Left;   ENDARTERECTOMY Right 04/04/2016   Procedure: ENDARTERECTOMY CAROTID ARTERY RIGHT;  Surgeon: Lonni GORMAN Blade, MD;  Location: [facility] OR;  Service: Vascular;  Laterality: Right;   ESOPHAGOGASTRODUODENOSCOPY (EGD) WITH PROPOFOL  N/A 03/11/2023   Procedure: ESOPHAGOGASTRODUODENOSCOPY (EGD) WITH PROPOFOL ;  Surgeon: Eartha Flavors Toribio, MD;  [facility];  Service: Gastroenterology;  Laterality: N/A;  11:45 AM;ASA 3   HOLMIUM LASER APPLICATION N/A 07/07/2014   Procedure: HOLMIUM LASER APPLICATION;  Surgeon: Norleen JINNY Seltzer, MD;  Location: [facility];  Service: Urology;  Laterality: N/A;   I & D  INFECTED SPIDER BITE UPPER BACK  06/ 2012   NEPHROLITHOTOMY Left 08/16/2014   Procedure: LEFT NEPHROLITHOTOMY PERCUTANEOUS;  Surgeon: Norleen JINNY Seltzer, MD;  Location: [facility];  Service: Urology;  Laterality: Left;   NEPHROLITHOTOMY Left  08/18/2014   Procedure: LEFT NEPHROLITHOTOMY PERCUTANEOUS SECOND LOOK;  Surgeon: Norleen JINNY Seltzer, MD;  Location: [facility];  Service: Urology;  Laterality: Left;   PATCH ANGIOPLASTY Right 04/04/2016   Procedure: PATCH ANGIOPLASTY RIGHT CAROTID ARTERY;  Surgeon: Lonni GORMAN Blade, MD;  Location: [facility] OR;  Service: Vascular;  Laterality: Right;   PERIPHERAL VASCULAR BALLOON ANGIOPLASTY  11/08/2021   Procedure: PERIPHERAL VASCULAR BALLOON ANGIOPLASTY;  Surgeon: Gretta Lonni JINNY, MD;  [facility]: [facility];  Service: Cardiovascular;;  Left AT   PERIPHERAL VASCULAR BALLOON ANGIOPLASTY Right 11/07/2022   Procedure: PERIPHERAL VASCULAR BALLOON ANGIOPLASTY;  Surgeon: Gretta Lonni JINNY, MD;  [facility]: [facility];  Service: Cardiovascular;  Laterality: Right;  right TP trunk and PT   POLYPECTOMY  03/11/2023   Procedure: POLYPECTOMY;  Surgeon: Eartha Angelia Sieving, MD;  [facility];  Service: Gastroenterology;;   RETINAL DETACHMENT SURGERY Bilateral 2013   TONSILLECTOMY AND ADENOIDECTOMY  as child    Family History  Problem Relation Age of Onset   Emphysema Mother    Cancer Mother        Throat   Heart disease Mother    Stroke Neg Hx     SOCIAL HISTORY: Social History   Socioeconomic History   Marital status: Single    Spouse name: Not on file   Number of children: 0   Years of education: Not on file   Highest education level: Not on file  Occupational History   Not on file  Tobacco Use   Smoking status: Never   Smokeless tobacco: Never  Vaping Use   Vaping status: Never Used  Substance and Sexual Activity   Alcohol use: Not Currently   Drug use: No   Sexual activity: Not on file  Other Topics Concern   Not on file  Social History Narrative   Lives with wife      Update 11/05/2022   Lives at [facility] currently   Right handed   Caffeine: none    Social Drivers of Corporate Investment Banker Strain: Low Risk  (09/05/2021)   Received from  [facility]   Overall Financial Resource Strain (CARDIA)    Difficulty of Paying Living Expenses: Not hard at all  Food Insecurity: Low Risk  (10/10/2023)   Received from [facility]   Hunger Vital Sign    Within the past 12 months, you worried that your food would run out before you got money to buy more: Never true    Within the past 12 months, the food you bought just didn't last and you didn't have money to get more. : Never true  Transportation Needs: No Transportation Needs (10/10/2023)   Received from [facility]    In the past 12 months, has lack of reliable transportation kept you from medical appointments, meetings, work or from getting things needed for daily living? : No  Physical Activity: Inactive (09/05/2021)  Received from [facility]   Exercise Vital Sign    On average, how many days per week do you engage in moderate to strenuous exercise (like a brisk walk)?: 0 days    On average, how many minutes do you engage in exercise at this level?: 0 min  Stress: No Stress Concern Present (09/05/2021)   Received from [facility] - Occupational Stress Questionnaire    Feeling of Stress : Not at all  Social Connections: Unknown (09/05/2021)   Received from [facility]   Social Connection and Isolation Panel    In a typical week, how many times do you talk on the phone with family, friends, or neighbors?: Once a week    How often do you get together with friends or relatives?: Once a week    How often do you attend church or religious services?: 1 to 4 times per year    Do you belong to any clubs or organizations such as church groups, unions, fraternal or athletic groups, or school groups?: No    How often do you attend meetings of the clubs or organizations you belong to?: 1 to 4 times per year    Marital Status: Not on file  Intimate Partner Violence: Not At Risk (05/13/2022)   Humiliation, Afraid, Rape, and  Kick questionnaire    Fear of Current or Ex-Partner: No    Emotionally Abused: No    Physically Abused: No    Sexually Abused: No    Allergies  Allergen Reactions   Cefepime  Other (See Comments)    Other Reaction(s): Flushing  Myoclonus  Myoclonus    Other Reaction(s): Flushing  Myoclonus    Current Outpatient Medications  Medication Sig Dispense Refill   acetaminophen  (TYLENOL ) 650 MG CR tablet Take 650 mg by mouth every 4 (four) hours as needed for pain.     albuterol  (VENTOLIN  HFA) 108 (90 Base) MCG/ACT inhaler Inhale into the lungs.     Ascorbic Acid (VITAMIN C PO) Take 500 mg by mouth 2 (two) times daily.     aspirin  EC 81 MG tablet Take 81 mg by mouth daily.     atorvastatin  (LIPITOR) 40 MG tablet Take 40 mg by mouth at bedtime.     Baclofen  5 MG TABS Take 1 tablet (5 mg total) by mouth 3 (three) times daily as needed (muscle spasm). 90 tablet    busPIRone  (BUSPAR ) 5 MG tablet Take 5 mg by mouth 2 (two) times daily.     cloNIDine  (CATAPRES  - DOSED IN MG/24 HR) 0.3 mg/24hr patch Place 1 patch (0.3 mg total) onto the skin once a week. 4 patch 12   clopidogrel  (PLAVIX ) 75 MG tablet Take 75 mg by mouth daily.     ferrous sulfate  325 (65 FE) MG tablet Take 325 mg by mouth daily with breakfast.     furosemide  (LASIX ) 40 MG tablet Take 1 tablet (40 mg total) by mouth daily. 30 tablet 1   GLUCAGON, RDNA, IJ Inject as directed. Prn  Hypoglycemia (Patient not taking: Reported on 04/26/2024)     guaiFENesin  (ROBITUSSIN) 100 MG/5ML liquid Take 5 mLs by mouth every 4 (four) hours as needed for cough or to loosen phlegm. (Patient not taking: Reported on 04/26/2024)     hydrALAZINE  (APRESOLINE ) 50 MG tablet Take 50 mg by mouth.     insulin  glargine-yfgn (SEMGLEE ) 100 UNIT/ML Pen      ipratropium-albuterol  (DUONEB) 0.5-2.5 (3) MG/3ML SOLN  labetalol  (NORMODYNE ) 200 MG tablet Take 1 tablet (200 mg total) by mouth 2 (two) times daily. (Patient taking differently: Take 200 mg by mouth 3  (three) times daily.) 60 tablet 0   loperamide (IMODIUM A-D) 2 MG tablet Take 2 mg by mouth 4 (four) times daily as needed for diarrhea or loose stools. 2 caplets initially, then 1 caplet following each loose stool.Do not exceed 4 caplets in a 24 hour period. (Patient not taking: Reported on 04/26/2024)     meclizine (ANTIVERT) 25 MG tablet Take 25 mg by mouth every 8 (eight) hours.     mineral oil-hydrophilic petrolatum (AQUAPHOR) ointment Apply 1 Application topically at bedtime.     Multiple Vitamin (MULTIVITAMIN) tablet Take 1 tablet by mouth daily.     ondansetron  (ZOFRAN -ODT) 4 MG disintegrating tablet Take 4 mg by mouth every 4 (four) hours as needed for nausea or vomiting. If not resolved after two doses then Phenergan  25 mg Q 6 hours prn. (Patient not taking: Reported on 04/26/2024)     OVER THE COUNTER MEDICATION Fleets enema x 1, if no results from above call the MD  Mylanta 30 cc po Q 2 hours prn acid indigestion. Do not exceed 4 doses in 24 hours.  Pro stat 64 Oral liquid 30 ml in the am's for wound healing. (Patient not taking: Reported on 04/26/2024)     pantoprazole  (PROTONIX ) 40 MG tablet Take 1 tablet (40 mg total) by mouth daily. 90 tablet 3   SANTYL 250 UNIT/GM ointment Apply topically. (Patient not taking: Reported on 04/26/2024)     sertraline  (ZOLOFT ) 25 MG tablet Take 1 tablet (25 mg total) by mouth daily. 30 tablet 0   sodium bicarbonate 650 MG tablet Take 650 mg by mouth.     sodium zirconium cyclosilicate  (LOKELMA ) 10 g PACK packet Take 10 g by mouth. One every morning on M,W,F for Hyperkalemia.     traMADol  (ULTRAM ) 50 MG tablet Take 1 tablet (50 mg total) by mouth 2 (two) times daily as needed for moderate pain. 10 tablet 0   VELTASSA 8.4 g packet Take 1 packet by mouth daily.     No current facility-administered medications for this visit.    REVIEW OF SYSTEMS:  [X]  denotes positive finding, [ ]  denotes negative finding Cardiac  Comments:  Chest pain or chest  pressure: ***   Shortness of breath upon exertion:    Short of breath when lying flat:    Irregular heart rhythm:        Vascular    Pain in calf, thigh, or hip brought on by ambulation:    Pain in feet at night that wakes you up from your sleep:     Blood clot in your veins:    Leg swelling:         Pulmonary    Oxygen at home:    Productive cough:     Wheezing:         Neurologic    Sudden weakness in arms or legs:     Sudden numbness in arms or legs:     Sudden onset of difficulty speaking or slurred speech:    Temporary loss of vision in one eye:     Problems with dizziness:         Gastrointestinal    Blood in stool:     Vomited blood:         Genitourinary    Burning when urinating:     Blood in  urine:        Psychiatric    Major depression:         Hematologic    Bleeding problems:    Problems with blood clotting too easily:        Skin    Rashes or ulcers:        Constitutional    Fever or chills:      PHYSICAL EXAM: There were no vitals filed for this visit.  GENERAL: The patient is a well-nourished male, in no acute distress. The vital signs are documented above. CARDIAC: There is a regular rate and rhythm.  VASCULAR: *** PULMONARY: There is good air exchange bilaterally without wheezing or rales. ABDOMEN: Soft and non-tender with normal pitched bowel sounds.  MUSCULOSKELETAL: There are no major deformities or cyanosis. NEUROLOGIC: No focal weakness or paresthesias are detected. SKIN: There are no ulcers or rashes noted. PSYCHIATRIC: The patient has a normal affect.  DATA:   Upper extremity vein mapping  Assessment/Plan:   63 y.o. male, with history of CKD stage IV, hypertension, hyperlipidemia, PVD, diabetes, carotid artery disease that presents for evaluation of permanent dialysis access.   Lonni DOROTHA Gaskins, MD [facility] Office: [phone]

## 2024-06-08 ENCOUNTER — Telehealth: Payer: Self-pay | Admitting: Gastroenterology

## 2024-06-08 ENCOUNTER — Ambulatory Visit

## 2024-06-08 ENCOUNTER — Ambulatory Visit (INDEPENDENT_AMBULATORY_CARE_PROVIDER_SITE_OTHER)

## 2024-06-08 ENCOUNTER — Ambulatory Visit: Admitting: Vascular Surgery

## 2024-06-08 ENCOUNTER — Encounter: Payer: Self-pay | Admitting: Vascular Surgery

## 2024-06-08 VITALS — BP 117/74 | HR 64 | Temp 98.0°F | Resp 20 | Ht 70.5 in | Wt 252.0 lb

## 2024-06-08 DIAGNOSIS — N184 Chronic kidney disease, stage 4 (severe): Secondary | ICD-10-CM

## 2024-06-08 DIAGNOSIS — I739 Peripheral vascular disease, unspecified: Secondary | ICD-10-CM | POA: Diagnosis not present

## 2024-06-08 DIAGNOSIS — E1122 Type 2 diabetes mellitus with diabetic chronic kidney disease: Secondary | ICD-10-CM

## 2024-06-08 NOTE — Telephone Encounter (Signed)
 [name] at [facility] called and said she called and spoke with someone this morning about an appointment for this patient on 11/6/ at 9:45.  He last saw [facility] when he was there in 2024.  She has an appt card for this appt but there is nothing in EPIC scheduled.  She wants someone to please call her back.   [phone]

## 2024-06-09 ENCOUNTER — Telehealth: Payer: Self-pay

## 2024-06-09 ENCOUNTER — Other Ambulatory Visit: Payer: Self-pay

## 2024-06-09 DIAGNOSIS — N186 End stage renal disease: Secondary | ICD-10-CM

## 2024-06-09 NOTE — Telephone Encounter (Signed)
 Spoke to [place] at [facility] re: scheduling of surgery with Dr Gretta.  Tentatively scheduled for 11/19 arrival time of 8:30.

## 2024-06-10 ENCOUNTER — Encounter (INDEPENDENT_AMBULATORY_CARE_PROVIDER_SITE_OTHER): Payer: Self-pay | Admitting: Gastroenterology

## 2024-06-22 ENCOUNTER — Encounter (HOSPITAL_COMMUNITY): Payer: Self-pay | Admitting: Vascular Surgery

## 2024-06-22 ENCOUNTER — Other Ambulatory Visit: Payer: Self-pay

## 2024-06-22 NOTE — Progress Notes (Addendum)
 PCP - Dr. Lynwood Murrain Cardiologist - denies  PPM/ICD - denies  Chest x-ray - 07/10/22 EKG - 06/18/24- requested tracing Stress Test - 11/03/13 ECHO - 07/03/22 Cardiac Cath - denies  CPAP - OSA+, pt does not wear CPAP  Fasting Blood Sugar - 100-110 Checks Blood Sugar 3-4 times/day  RN Tiffany states that the pt takes Semglee  as needed, and is only used as needed due to pt's frequent refusal  Blood Thinner Instructions: Hold Plavix  5 days. Last dose 11/14 Aspirin  Instructions: continue  ERAS Protcol - no, NPO  COVID TEST- n/a  Anesthesia review: yes, cardiac hx  Patient verbally denies any shortness of breath, fever, cough and chest pain during phone call  Instructions faxed to Annabella PEAK, at [phone]    Questions were answered. Patient verbalized understanding of instructions.

## 2024-06-22 NOTE — Anesthesia Preprocedure Evaluation (Signed)
 Anesthesia Evaluation  Patient identified by MRN, date of birth, ID band  Reviewed: Allergy & Precautions, Patient's Chart, lab work & pertinent test results  History of Anesthesia Complications (+) DIFFICULT IV STICK / SPECIAL LINE and history of anesthetic complications  Airway Mallampati: II  TM Distance: >3 FB Neck ROM: Full    Dental  (+) Teeth Intact, Dental Advisory Given   Pulmonary asthma , sleep apnea    breath sounds clear to auscultation       Cardiovascular hypertension (SBPs in 150s), Pt. on medications  Rhythm:Regular Rate:Normal  Echo 06/06/2024: IMPRESSIONS   1. Left ventricular ejection fraction, by estimation, is 60 to 65%. The  left ventricle has normal function. The left ventricle has no regional  wall motion abnormalities. Left ventricular diastolic parameters are  consistent with Grade I diastolic  dysfunction (impaired relaxation).   2. Right ventricular systolic function is normal. The right ventricular  size is normal.   3. The mitral valve is grossly normal. No evidence of mitral valve  regurgitation. No evidence of mitral stenosis.   4. The aortic valve is normal in structure. Aortic valve regurgitation is  not visualized. No aortic stenosis is present.   5. The inferior vena cava is normal in size with greater than 50%  respiratory variability, suggesting right atrial pressure of 3 mmHg.   6. Agitated saline contrast bubble study was negative, with no evidence  of any interatrial shunt.     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/2024)   Endo/Other  diabetes, Type 1, Insulin  Dependent    Renal/GU ESRF and DialysisRenal disease     Musculoskeletal  (+) Arthritis ,    Abdominal   Peds  Hematology  (+) Blood dyscrasia, anemia   Anesthesia Other Findings   Reproductive/Obstetrics                               Anesthesia Physical Anesthesia Plan  ASA: 3  Anesthesia Plan: General   Post-op Pain Management:    Induction: Intravenous  PONV Risk Score and Plan: 2 and Ondansetron , Dexamethasone , Treatment may vary due to age or medical condition and Midazolam   Airway Management Planned: Oral ETT and LMA  Additional Equipment:   Intra-op Plan:   Post-operative Plan: Extubation in OR  Informed Consent:      Dental advisory given  Plan Discussed with: CRNA  Anesthesia Plan Comments: (PAT note written 06/22/2024 by Isaiah Ruder, PA-C - PMH of smoker, HTN, HLD, DM1 (with retinopathy, neuropathy), pancreatitis, chronic diastolic CHF, ESRD (HD initiated 05/2020, TTS), OSA (does not use CPAP), 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   [place] admission 06/04/2024-06/06/2024 for syncope after returning from dialysis.   Anes Hx:  Gastric Sleeve - Difficult Mask (requiring two providers), Grade 2B view via Mac 3  Difficult IV placement requiring ultrasound guidance x 2 attempts by myself in limited upper extremities   )         Anesthesia Quick Evaluation

## 2024-06-22 NOTE — Progress Notes (Signed)
 Anesthesia Chart Review: SAME DAY WORK-UP  Case: [id number] Date/Time: 06/23/24 1039   Procedure: ARTERIOVENOUS (AV) FISTULA CREATION (Left)   Anesthesia type: Choice   Diagnosis: ESRD (end stage renal disease) (HCC) [N18.6]   Pre-op  diagnosis: ESRD   [facility] / [facility]   Surgeons: Gretta Lonni PARAS, MD       DISCUSSION: Patient is a 63 year old male scheduled for the above procedure. He has CKD stage IV and was not on hemodialysis as of 06/08/2204 visit with Dr. Gretta.   History includes never smoker, HTN, HLD, CAD, DM2 (with neuropathy, nephropathy), PAD (left TMA 09/25/2021; left AT artery angioplasty 11/08/2021; right TP trunk and PT artery angioplasty 11/07/2022, right 2nd toe amputation 11/08/2022), CKD, CVA, carotid artery stenosis (s/p right CEA 04/04/2016), GERD, left retinal detachment (legally blind OS).   On 05/12/2022 he was admitted with acute respiratory failure likely due to multiple focal pneumonia requiring BiPAP->high flow [place] at 100% with 25L. TTE showed LVEF 45-50% with elevated LVEDP, hypokinesis of the LV apical anterior wall, inferior wall, lateral wall, inferolateral wall and apical segment. He was discharged back to SNF on 05/18/2022. He was readmitted a month later 06/29/2022 - 07/16/2022 for acute respiratory distress with O2 sats in the 60's s/p intubation. Imaging concerning for ARDS and multifocal PNA. +Influenza A. Treated with Tamiflu , steroids, furosemide , bronchodilators, vancomycin , cefepime /Zosyn , and azithromycin . Extubated 07/09/2022. Troponin was elevated, but thought secondary to demand ischemia in setting of acute respiratory failure. Limited echo showed normalization of his LVEF to 55-60% with hypokinesis of the apex. He was discharged back to SNF, but out-patient stress test arranged by [facility] cardiologist Dr. Epimenio Assar. 2/202/2024 Lexiscan  MPI suggestive of circumflex territory ischemia. Although with presumed CAD, medical therapy planned given he was  asymptomatic, had normal LVEF, and had underlying CKD. Last TTE on 02/05/2024 showed LVEF > 55%, mild asymmetrical septal hypertrophy, no pulmonary hypertension, estimated PASP 17 mmHg. Last visit with cardiology was on 06/18/2024 with Richarda Barter, NP due to dizziness with standing since on nifedipine and Imdur. Imdur was discontinued and nifedipine dose decreased to 15 mg daily. Clonidine , labetalol , and Lasix  continued. ASA and and atorvastatin  continued. NP indicated he would plan to discuss with vascular surgery if Plavix  could be discontinued. Two month follow-up planned.   Anesthesia team to evaluate on the day of surgery.  VVS advised to hold Plavix  after 06/18/2024 dose.   VS: Ht 5' 10.5 (1.791 m)   Wt 113.4 kg   BMI 35.36 kg/m  BP Readings from Last 3 Encounters:  06/08/24 117/74  04/26/24 (!) 100/55  04/07/24 122/65   Pulse Readings from Last 3 Encounters:  06/08/24 64  04/26/24 63  04/07/24 68    PROVIDERS: [place], Teressa Thom Earla Maude, MD is cardiologist [facility]) Neomi Lis, PA-C is HEM provider for IDA (s/p intermittent IV iron)   LABS: For day of surgery.  As of 04/26/2024 hemoglobin 12.3, glucose 180, creatinine 3.30.   EKG: For day of surgery as indicated.  Per 06/18/2024 [facility] Cardiology note: ECG: NSR LAD inferior infarct age undetermined, possible anterior infarct age undetermined ST and T wave abnormality consider lateral ischemia, rate of 66.   CV: Echo: 02/05/2024 [facility]): Summary 1. Technically difficult study. 2. The left ventricle is normal in size with increased wall thickness. 3. There is mild asymmetric septal hypertrophy. 4. The left ventricular systolic function is normal, LVEF is visually estimated at > 55%. 5. Mitral annular calcification is present. 6. The  aortic valve is probably trileaflet with mildly thickened leaflets with normal excursion. 7. The right ventricle is normal in size, with normal systolic  function.   Nuclear stress test 09/24/2022 [facility]): 1. Reversibility basal involving the mid inferolateral and mid anterolateral segments consistent with ischemia involving the left circumflex. Fixed defect involving the mid inferior segment compatible with infarct involving the RCA or left circumflex.  2. Normal left ventricular wall motion.  3. Left ventricular ejection fraction 54%  4. Non invasive risk stratification*: Intermediate  *2012 Appropriate Use Criteria for Coronary Revascularization Focused Update: J Am Coll Cardiol. 2012;59(9):857-881. http://content.dementiazones.com.aspx?articleid=1201161  - Medical therapy planned given asymptomatic, normal LVEF, underlying CKD.   Carotid Duplex: 02/14/2016: IMPRESSION:  1. Bilateral carotid bifurcation and proximal ICA plaque, resulting in 70-99% diameter right ICA stenosis, less than 50% diameter left ICA stenosis. Consider vascular surgical or neuro interventional radiology consultation.  2. Antegrade bilateral vertebral arterial flow. S/p right carotid endarterectomy by Dr. Lonni Blade for symptomatic disease on 04/04/2016.   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  STROKE   JULY  2017   Tinea unguium    Type 2 diabetes mellitus (HCC)    Vitamin D deficiency     Past Surgical History:  Procedure Laterality Date   ABDOMINAL AORTOGRAM W/LOWER EXTREMITY N/A 11/08/2021   Procedure: ABDOMINAL AORTOGRAM W/LOWER EXTREMITY;  Surgeon: Gretta Lonni PARAS, MD;  [facility];  Service: Cardiovascular;  Laterality: N/A;   ABDOMINAL AORTOGRAM W/LOWER EXTREMITY Right 11/07/2022   Procedure: ABDOMINAL AORTOGRAM W/LOWER EXTREMITY;  Surgeon: Gretta Lonni PARAS, MD;  [facility];  Service: Cardiovascular;  Laterality: Right;   AMPUTATION Bilateral 2012   Left big toe partial and right big toe complete   BIOPSY  03/11/2023   Procedure: BIOPSY;  Surgeon: Eartha Flavors, Toribio, MD;  [facility];  Service: Gastroenterology;;  upper and lower bx's   CARDIOVASCULAR STRESS TEST  04-05-2014  dr croitoru   low risk lexiscan  nuclear study with mild diaphragmatic attenuation artifact/  no ischemia/  ef 58%   CATARACT EXTRACTION W/ INTRAOCULAR LENS  IMPLANT, BILATERAL  2013   COLONOSCOPY WITH PROPOFOL  N/A 03/11/2023   Procedure: COLONOSCOPY WITH PROPOFOL ;  Surgeon: Eartha Flavors Toribio, MD;  [facility];  Service: Gastroenterology;  Laterality: N/A;  11:45 AM;ASA 3   CYSTO /  LEFT URETERAL STENT PLACEMENT  2012   CYSTOSCOPY W/ URETERAL STENT REMOVAL Left 07/07/2014   Procedure: CYSTO WITH  LEFT PORTION STENT REMOVAL;  Surgeon: Norleen JINNY Seltzer, MD;  Location: [facility];  Service: Urology;  Laterality: Left;   CYSTOSCOPY WITH URETEROSCOPY AND STENT PLACEMENT Left 07/07/2014   Procedure: CYSTOLITHALOPAXY URETEROSCOPY WITH STENT;  Surgeon: Norleen JINNY Seltzer, MD;  Location: [facility];  Service: Urology;  Laterality: Left;   ENDARTERECTOMY Right 04/04/2016   Procedure: ENDARTERECTOMY CAROTID ARTERY RIGHT;  Surgeon: Lonni GORMAN Blade, MD;  Location: [facility] OR;  Service: Vascular;  Laterality: Right;    ESOPHAGOGASTRODUODENOSCOPY (EGD) WITH PROPOFOL  N/A 03/11/2023   Procedure: ESOPHAGOGASTRODUODENOSCOPY (EGD) WITH PROPOFOL ;  Surgeon: Eartha Angelia Sieving, MD;  [facility];  Service: Gastroenterology;  Laterality: N/A;  11:45 AM;ASA 3   HOLMIUM LASER APPLICATION N/A 07/07/2014   Procedure: HOLMIUM LASER APPLICATION;  Surgeon: Norleen JINNY Seltzer, MD;  Location: [facility];  Service: Urology;  Laterality: N/A;   I & D  INFECTED SPIDER BITE UPPER BACK  06/ 2012   NEPHROLITHOTOMY Left 08/16/2014   Procedure: LEFT NEPHROLITHOTOMY PERCUTANEOUS;  Surgeon: Norleen JINNY Seltzer, MD;  Location: [facility] ORS;  Service: Urology;  Laterality: Left;   NEPHROLITHOTOMY Left 08/18/2014   Procedure: LEFT NEPHROLITHOTOMY PERCUTANEOUS SECOND LOOK;  Surgeon: Norleen JINNY Seltzer, MD;  Location: [facility] ORS;  Service: Urology;  Laterality: Left;   PATCH ANGIOPLASTY Right 04/04/2016   Procedure: PATCH ANGIOPLASTY RIGHT CAROTID ARTERY;  Surgeon: Lonni GORMAN Blade, MD;  Location: [facility] OR;  Service: Vascular;  Laterality: Right;   PERIPHERAL VASCULAR BALLOON ANGIOPLASTY  11/08/2021   Procedure: PERIPHERAL VASCULAR BALLOON ANGIOPLASTY;  Surgeon: Gretta Lonni JINNY, MD;  [facility];  Service: Cardiovascular;;  Left AT   PERIPHERAL VASCULAR BALLOON ANGIOPLASTY Right 11/07/2022   Procedure: PERIPHERAL VASCULAR BALLOON ANGIOPLASTY;  Surgeon: Gretta Lonni JINNY, MD;  [facility];  Service: Cardiovascular;  Laterality: Right;  right TP trunk and PT   POLYPECTOMY  03/11/2023   Procedure: POLYPECTOMY;  Surgeon: Eartha Angelia Sieving, MD;  [facility];  Service: Gastroenterology;;   RETINAL DETACHMENT SURGERY Bilateral 2013   TONSILLECTOMY AND ADENOIDECTOMY  as child    MEDICATIONS: No current facility-administered medications for this encounter.    traZODone  (DESYREL ) 50 MG tablet   acetaminophen  (TYLENOL ) 650 MG CR tablet   albuterol  (VENTOLIN  HFA) 108 (90 Base) MCG/ACT inhaler    Ascorbic Acid (VITAMIN C PO)   aspirin  EC 81 MG tablet   atorvastatin  (LIPITOR) 40 MG tablet   Baclofen  5 MG TABS   busPIRone  (BUSPAR ) 5 MG tablet   cloNIDine  (CATAPRES  - DOSED IN MG/24 HR) 0.3 mg/24hr patch   clopidogrel  (PLAVIX ) 75 MG tablet   ferrous sulfate  325 (65 FE) MG tablet   furosemide  (LASIX ) 40 MG tablet   hydrALAZINE  (APRESOLINE ) 50 MG tablet   insulin  glargine-yfgn (SEMGLEE ) 100 UNIT/ML Pen   ipratropium-albuterol  (DUONEB) 0.5-2.5 (3) MG/3ML SOLN   meclizine (ANTIVERT) 25 MG tablet   mineral oil-hydrophilic petrolatum (AQUAPHOR) ointment   Multiple Vitamin (MULTIVITAMIN) tablet   pantoprazole  (PROTONIX ) 40 MG tablet   sertraline  (ZOLOFT ) 25 MG tablet   sodium bicarbonate 650 MG tablet   sodium zirconium cyclosilicate  (LOKELMA ) 10 g PACK packet   traMADol  (ULTRAM ) 50 MG tablet   VELTASSA 8.4 g packet    Isaiah Ruder, PA-C Surgical Short Stay/Anesthesiology [facility] Phone [phone] [facility] Phone [phone] 06/22/2024 3:59 PM

## 2024-06-22 NOTE — Pre-Procedure Instructions (Signed)
 -------------  SDW INSTRUCTIONS given:  Your procedure is scheduled on 11/19.  Report to [facility] Main Entrance A at 08:25 A.M., and check in at the Admitting office.  Any questions or running late day of surgery: call [phone]    Remember:  Do not eat or drink after midnight the night before your surgery     Take these medicines the morning of surgery with A SIP OF WATER  aspirin  EC  busPIRone  (BUSPAR )  hydrALAZINE  (APRESOLINE )  ipratropium-albuterol  (DUONEB)  meclizine (ANTIVERT)  pantoprazole  (PROTONIX )  sertraline  (ZOLOFT )    May take these medicines IF NEEDED: albuterol  (VENTOLIN  HFA)- bring inhaler with you acetaminophen  (TYLENOL )  Baclofen   traMADol  (ULTRAM )    As of today, STOP taking any Aspirin  (unless otherwise instructed by your surgeon) Aleve, Naproxen, Ibuprofen , Motrin , Advil , Goody's, BC's, all herbal medications, fish oil, and all vitamins.   PLEASE SEND A LIST OF MEDICATIONS WITH PATIENT ON DAY OF SURGERY INCLUDING THE LAST DOSE OF EACH MEDICATION PLEASE!    WHAT DO I DO ABOUT MY DIABETES MEDICATION?  THE NIGHT BEFORE SURGERY, take 1/2 dose of insulin  glargine-yfgn (SEMGLEE )        HOW TO MANAGE YOUR DIABETES BEFORE AND AFTER SURGERY  Why is it important to control my blood sugar before and after surgery? Improving blood sugar levels before and after surgery helps healing and can limit problems. A way of improving blood sugar control is eating a healthy diet by:  Eating less sugar and carbohydrates  Increasing activity/exercise  Talking with your doctor about reaching your blood sugar goals High blood sugars (greater than 180 mg/dL) can raise your risk of infections and slow your recovery, so you will need to focus on controlling your diabetes during the weeks before surgery. Make sure that the doctor who takes care of your diabetes knows about your planned surgery including the date and location.  How do I manage my blood sugar before  surgery? Check your blood sugar at least 4 times a day, starting 2 days before surgery, to make sure that the level is not too high or low.  Check your blood sugar the morning of your surgery when you wake up and every 2 hours until you get to the Short Stay unit.  If your blood sugar is less than 70 mg/dL, you will need to treat for low blood sugar: Do not take insulin . Treat a low blood sugar (less than 70 mg/dL) with  cup of clear juice (cranberry or apple), 4 glucose tablets, OR glucose gel. Recheck blood sugar in 15 minutes after treatment (to make sure it is greater than 70 mg/dL). If your blood sugar is not greater than 70 mg/dL on recheck, call [phone] for further instructions. Report your blood sugar to the short stay nurse when you get to Short Stay.  If you are admitted to the hospital after surgery: Your blood sugar will be checked by the staff and you will probably be given insulin  after surgery (instead of oral diabetes medicines) to make sure you have good blood sugar levels. The goal for blood sugar control after surgery is 80-180 mg/dL.   Do NOT Smoke (Tobacco/Vaping) 24 hours prior to your procedure  If you use a CPAP at night, you may bring all equipment for your overnight stay.     You will be asked to remove any contacts, glasses, piercing's, hearing aid's, dentures/partials prior to surgery. Please bring cases for these items if needed.     Patients discharged  the day of surgery will not be allowed to drive home, and someone needs to stay with them for 24 hours.  SURGICAL WAITING ROOM VISITATION Patients may have no more than 2 support people in the waiting area - these visitors may rotate.   Pre-op nurse will coordinate an appropriate time for 1 ADULT support person, who may not rotate, to accompany patient in pre-op.  Children under the age of 24 must have an adult with them who is not the patient and must remain in the main waiting area with an adult.  If  the patient needs to stay at the hospital during part of their recovery, the visitor guidelines for inpatient rooms apply.  Please refer to the [facility] website for the visitor guidelines for any additional information.   Special instructions:   [facility]- Preparing For Surgery   Please follow these instructions carefully.   Shower the NIGHT BEFORE SURGERY and the MORNING OF SURGERY with DIAL Soap.   Pat yourself dry with a CLEAN TOWEL.  Wear CLEAN PAJAMAS to bed the night before surgery  Place CLEAN SHEETS on your bed the night of your first shower and DO NOT SLEEP WITH PETS.   Additional instructions for the day of surgery: DO NOT APPLY any lotions, deodorants, cologne, or perfumes.   Do not wear jewelry or makeup Do not wear nail polish, gel polish, artificial nails, or any other type of covering on natural nails (fingers and toes) Do not bring valuables to the hospital. [facility] is not responsible for valuables/personal belongings. Put on clean/comfortable clothes.  Please brush your teeth.  Ask your nurse before applying any prescription medications to the skin.

## 2024-06-23 ENCOUNTER — Encounter (HOSPITAL_COMMUNITY): Payer: Self-pay | Admitting: Vascular Surgery

## 2024-06-23 ENCOUNTER — Ambulatory Visit (HOSPITAL_COMMUNITY)
Admission: RE | Admit: 2024-06-23 | Discharge: 2024-06-23 | Disposition: A | Discharge status: Home / Self Care | Attending: Vascular Surgery | Admitting: Vascular Surgery

## 2024-06-23 ENCOUNTER — Ambulatory Visit (HOSPITAL_COMMUNITY): Payer: Self-pay | Admitting: Vascular Surgery

## 2024-06-23 ENCOUNTER — Ambulatory Visit (HOSPITAL_BASED_OUTPATIENT_CLINIC_OR_DEPARTMENT_OTHER): Payer: Self-pay | Admitting: Vascular Surgery

## 2024-06-23 ENCOUNTER — Other Ambulatory Visit (HOSPITAL_COMMUNITY): Payer: Self-pay

## 2024-06-23 ENCOUNTER — Encounter (HOSPITAL_COMMUNITY)
Admission: RE | Disposition: A | Discharge status: Home / Self Care | Payer: Self-pay | Source: Home / Self Care | Attending: Vascular Surgery

## 2024-06-23 DIAGNOSIS — Z8673 Personal history of transient ischemic attack (TIA), and cerebral infarction without residual deficits: Secondary | ICD-10-CM | POA: Insufficient documentation

## 2024-06-23 DIAGNOSIS — E1140 Type 2 diabetes mellitus with diabetic neuropathy, unspecified: Secondary | ICD-10-CM | POA: Insufficient documentation

## 2024-06-23 DIAGNOSIS — I5023 Acute on chronic systolic (congestive) heart failure: Secondary | ICD-10-CM

## 2024-06-23 DIAGNOSIS — K219 Gastro-esophageal reflux disease without esophagitis: Secondary | ICD-10-CM | POA: Insufficient documentation

## 2024-06-23 DIAGNOSIS — H548 Legal blindness, as defined in USA: Secondary | ICD-10-CM | POA: Diagnosis not present

## 2024-06-23 DIAGNOSIS — Z7982 Long term (current) use of aspirin: Secondary | ICD-10-CM | POA: Insufficient documentation

## 2024-06-23 DIAGNOSIS — E1159 Type 2 diabetes mellitus with other circulatory complications: Secondary | ICD-10-CM

## 2024-06-23 DIAGNOSIS — E1151 Type 2 diabetes mellitus with diabetic peripheral angiopathy without gangrene: Secondary | ICD-10-CM | POA: Diagnosis not present

## 2024-06-23 DIAGNOSIS — N186 End stage renal disease: Secondary | ICD-10-CM | POA: Insufficient documentation

## 2024-06-23 DIAGNOSIS — I132 Hypertensive heart and chronic kidney disease with heart failure and with stage 5 chronic kidney disease, or end stage renal disease: Secondary | ICD-10-CM

## 2024-06-23 DIAGNOSIS — I2510 Atherosclerotic heart disease of native coronary artery without angina pectoris: Secondary | ICD-10-CM | POA: Insufficient documentation

## 2024-06-23 DIAGNOSIS — Z794 Long term (current) use of insulin: Secondary | ICD-10-CM | POA: Diagnosis not present

## 2024-06-23 DIAGNOSIS — I120 Hypertensive chronic kidney disease with stage 5 chronic kidney disease or end stage renal disease: Secondary | ICD-10-CM | POA: Insufficient documentation

## 2024-06-23 DIAGNOSIS — N184 Chronic kidney disease, stage 4 (severe): Secondary | ICD-10-CM | POA: Diagnosis not present

## 2024-06-23 DIAGNOSIS — E1122 Type 2 diabetes mellitus with diabetic chronic kidney disease: Secondary | ICD-10-CM | POA: Diagnosis not present

## 2024-06-23 DIAGNOSIS — F32A Depression, unspecified: Secondary | ICD-10-CM | POA: Insufficient documentation

## 2024-06-23 DIAGNOSIS — Z7902 Long term (current) use of antithrombotics/antiplatelets: Secondary | ICD-10-CM | POA: Insufficient documentation

## 2024-06-23 DIAGNOSIS — Z79899 Other long term (current) drug therapy: Secondary | ICD-10-CM | POA: Insufficient documentation

## 2024-06-23 DIAGNOSIS — E785 Hyperlipidemia, unspecified: Secondary | ICD-10-CM | POA: Diagnosis not present

## 2024-06-23 DIAGNOSIS — G4730 Sleep apnea, unspecified: Secondary | ICD-10-CM | POA: Diagnosis not present

## 2024-06-23 HISTORY — DX: Hyperkalemia: E87.5

## 2024-06-23 HISTORY — PX: AV FISTULA PLACEMENT: SHX1204

## 2024-06-23 LAB — GLUCOSE, CAPILLARY
Glucose-Capillary: 107 mg/dL — ABNORMAL HIGH (ref 70–99)
Glucose-Capillary: 86 mg/dL (ref 70–99)

## 2024-06-23 LAB — POCT I-STAT, CHEM 8
BUN: 46 mg/dL — ABNORMAL HIGH (ref 8–23)
Calcium, Ion: 1.16 mmol/L (ref 1.15–1.40)
Chloride: 102 mmol/L (ref 98–111)
Creatinine, Ser: 3.8 mg/dL — ABNORMAL HIGH (ref 0.61–1.24)
Glucose, Bld: 98 mg/dL (ref 70–99)
HCT: 35 % — ABNORMAL LOW (ref 39.0–52.0)
Hemoglobin: 11.9 g/dL — ABNORMAL LOW (ref 13.0–17.0)
Potassium: 4 mmol/L (ref 3.5–5.1)
Sodium: 138 mmol/L (ref 135–145)
TCO2: 27 mmol/L (ref 22–32)

## 2024-06-23 SURGERY — ARTERIOVENOUS (AV) FISTULA CREATION
Anesthesia: General | Site: Arm Upper | Laterality: Left

## 2024-06-23 MED ORDER — ONDANSETRON HCL 4 MG/2ML IJ SOLN: 4.0000 mg | Freq: Once | INTRAMUSCULAR | Status: DC | PRN

## 2024-06-23 MED ORDER — 0.9 % SODIUM CHLORIDE (POUR BTL) OPTIME
TOPICAL | Status: DC | PRN
  Administered 2024-06-23: 1000 mL

## 2024-06-23 MED ORDER — CHLORHEXIDINE GLUCONATE 4 % EX SOLN: 60.0000 mL | Freq: Once | CUTANEOUS | Status: DC

## 2024-06-23 MED ORDER — TRAMADOL HCL 50 MG PO TABS
50.0000 mg | ORAL_TABLET | Freq: Four times a day (QID) | ORAL | 0 refills | Status: DC | PRN
  Filled 2024-06-23: qty 10, 3d supply, fill #0

## 2024-06-23 MED ORDER — HEPARIN SODIUM (PORCINE) 1000 UNIT/ML IJ SOLN
INTRAMUSCULAR | Status: DC | PRN
  Administered 2024-06-23: 5000 [IU] via INTRAVENOUS

## 2024-06-23 MED ORDER — PROPOFOL 10 MG/ML IV BOLUS
INTRAVENOUS | Status: DC | PRN
Start: 2024-06-23 — End: 2024-06-23
  Administered 2024-06-23: 100 mg via INTRAVENOUS

## 2024-06-23 MED ORDER — SODIUM CHLORIDE 0.9 % IV SOLN: INTRAVENOUS | Status: DC

## 2024-06-23 MED ORDER — PROPOFOL 10 MG/ML IV BOLUS
INTRAVENOUS | Status: AC
  Filled 2024-06-23: qty 20

## 2024-06-23 MED ORDER — ACETAMINOPHEN 500 MG PO TABS
1000.0000 mg | ORAL_TABLET | Freq: Once | ORAL | Status: AC
  Administered 2024-06-23: 1000 mg via ORAL
  Filled 2024-06-23: qty 2

## 2024-06-23 MED ORDER — MIDAZOLAM HCL 2 MG/2ML IJ SOLN
INTRAMUSCULAR | Status: AC
  Filled 2024-06-23: qty 2

## 2024-06-23 MED ORDER — ONDANSETRON HCL 4 MG/2ML IJ SOLN
INTRAMUSCULAR | Status: AC
Start: 2024-06-23 — End: 2024-06-23
  Filled 2024-06-23: qty 2

## 2024-06-23 MED ORDER — HEPARIN 6000 UNIT IRRIGATION SOLUTION
Status: AC
  Filled 2024-06-23: qty 500

## 2024-06-23 MED ORDER — MIDAZOLAM HCL (PF) 2 MG/2ML IJ SOLN
INTRAMUSCULAR | Status: DC | PRN
  Administered 2024-06-23: 2 mg via INTRAVENOUS

## 2024-06-23 MED ORDER — FENTANYL CITRATE (PF) 100 MCG/2ML IJ SOLN: 25.0000 ug | INTRAMUSCULAR | Status: DC | PRN

## 2024-06-23 MED ORDER — FENTANYL CITRATE (PF) 250 MCG/5ML IJ SOLN
INTRAMUSCULAR | Status: DC | PRN
  Administered 2024-06-23: 25 ug via INTRAVENOUS
  Administered 2024-06-23: 50 ug via INTRAVENOUS
  Administered 2024-06-23: 25 ug via INTRAVENOUS

## 2024-06-23 MED ORDER — FENTANYL CITRATE (PF) 250 MCG/5ML IJ SOLN
INTRAMUSCULAR | Status: AC
  Filled 2024-06-23: qty 5

## 2024-06-23 MED ORDER — CHLORHEXIDINE GLUCONATE 0.12 % MT SOLN
15.0000 mL | Freq: Once | OROMUCOSAL | Status: AC
  Administered 2024-06-23: 15 mL via OROMUCOSAL
  Filled 2024-06-23: qty 15

## 2024-06-23 MED ORDER — ONDANSETRON HCL 4 MG/2ML IJ SOLN
INTRAMUSCULAR | Status: DC | PRN
  Administered 2024-06-23: 4 mg via INTRAVENOUS

## 2024-06-23 MED ORDER — VANCOMYCIN HCL 1500 MG/300ML IV SOLN
1500.0000 mg | INTRAVENOUS | Status: AC
  Administered 2024-06-23: 1500 mg via INTRAVENOUS
  Filled 2024-06-23: qty 300

## 2024-06-23 MED ORDER — LIDOCAINE 2% (20 MG/ML) 5 ML SYRINGE
INTRAMUSCULAR | Status: DC | PRN
  Administered 2024-06-23: 60 mg via INTRAVENOUS

## 2024-06-23 MED ORDER — ORAL CARE MOUTH RINSE: 15.0000 mL | Freq: Once | OROMUCOSAL | Status: AC

## 2024-06-23 MED ORDER — DEXAMETHASONE SOD PHOSPHATE PF 10 MG/ML IJ SOLN
INTRAMUSCULAR | Status: DC | PRN
  Administered 2024-06-23: 5 mg via INTRAVENOUS

## 2024-06-23 MED ORDER — LIDOCAINE 2% (20 MG/ML) 5 ML SYRINGE
INTRAMUSCULAR | Status: AC
  Filled 2024-06-23: qty 5

## 2024-06-23 MED ORDER — HEPARIN 6000 UNIT IRRIGATION SOLUTION
Status: DC | PRN
  Administered 2024-06-23: 1

## 2024-06-23 SURGICAL SUPPLY — 28 items
ARMBAND PINK RESTRICT EXTREMIT (MISCELLANEOUS) ×2 IMPLANT
BAG COUNTER SPONGE SURGICOUNT (BAG) ×1 IMPLANT
BLADE CLIPPER SURG (BLADE) ×1 IMPLANT
CANISTER SUCTION 3000ML PPV (SUCTIONS) ×1 IMPLANT
CLIP TI MEDIUM 6 (CLIP) ×1 IMPLANT
CLIP TI WIDE RED SMALL 6 (CLIP) ×1 IMPLANT
COVER PROBE W GEL 5X96 (DRAPES) ×1 IMPLANT
DERMABOND ADVANCED .7 DNX12 (GAUZE/BANDAGES/DRESSINGS) ×1 IMPLANT
ELECTRODE REM PT RTRN 9FT ADLT (ELECTROSURGICAL) ×1 IMPLANT
GLOVE BIO SURGEON STRL SZ7.5 (GLOVE) ×1 IMPLANT
GLOVE BIOGEL PI IND STRL 8 (GLOVE) ×1 IMPLANT
GOWN STRL REUS W/ TWL LRG LVL3 (GOWN DISPOSABLE) ×2 IMPLANT
GOWN STRL REUS W/ TWL XL LVL3 (GOWN DISPOSABLE) ×2 IMPLANT
KIT BASIN OR (CUSTOM PROCEDURE TRAY) ×1 IMPLANT
KIT TURNOVER KIT B (KITS) ×1 IMPLANT
LOOP VESSEL MINI RED (MISCELLANEOUS) IMPLANT
PACK CV ACCESS (CUSTOM PROCEDURE TRAY) ×1 IMPLANT
PAD ARMBOARD POSITIONER FOAM (MISCELLANEOUS) ×2 IMPLANT
SLING ARM FOAM STRAP LRG (SOFTGOODS) IMPLANT
SOLN 0.9% NACL POUR BTL 1000ML (IV SOLUTION) ×1 IMPLANT
SOLN STERILE WATER BTL 1000 ML (IV SOLUTION) ×1 IMPLANT
SPIKE FLUID TRANSFER (MISCELLANEOUS) ×1 IMPLANT
SUT MNCRL AB 4-0 PS2 18 (SUTURE) ×1 IMPLANT
SUT PROLENE 6 0 BV (SUTURE) ×1 IMPLANT
SUT PROLENE 7 0 BV 1 (SUTURE) IMPLANT
SUT VIC AB 3-0 SH 27X BRD (SUTURE) ×1 IMPLANT
TOWEL GREEN STERILE (TOWEL DISPOSABLE) ×1 IMPLANT
UNDERPAD 30X36 HEAVY ABSORB (UNDERPADS AND DIAPERS) ×1 IMPLANT

## 2024-06-23 NOTE — Anesthesia Postprocedure Evaluation (Signed)
 Anesthesia Post Note  [name]  Procedure(s) Performed: BRACHIOCEPHALIC ARTERIOVENOUS (AV) FISTULA CREATION (Left: Arm Upper)     Patient location during evaluation: PACU Anesthesia Type: General Level of consciousness: awake and alert Pain management: pain level controlled Vital Signs Assessment: post-procedure vital signs reviewed and stable Respiratory status: spontaneous breathing, nonlabored ventilation and respiratory function stable Cardiovascular status: stable and blood pressure returned to baseline Anesthetic complications: no   No notable events documented.  Last Vitals:  Vitals:   06/23/24 1230 06/23/24 1237  BP: (!) 169/63 (!) 169/63  Pulse: 61 60  Resp: 15 11  Temp:  36.7 C  SpO2: 97% 100%    Last Pain:  Vitals:   06/23/24 0902  TempSrc:   PainSc: 0-No pain                 Debby FORBES Like

## 2024-06-23 NOTE — Anesthesia Procedure Notes (Signed)
 Procedure Name: LMA Insertion Date/Time: 06/23/2024 10:57 AM  Performed by: Atanacio Arland HERO, CRNAPre-anesthesia Checklist: Patient identified, Emergency Drugs available, Suction available and Patient being monitored Patient Re-evaluated:Patient Re-evaluated prior to induction Oxygen Delivery Method: Circle System Utilized Preoxygenation: Pre-oxygenation with 100% oxygen Induction Type: IV induction Ventilation: Mask ventilation without difficulty LMA: LMA inserted LMA Size: 5.0 Number of attempts: 1 Placement Confirmation: positive ETCO2 Tube secured with: Tape Dental Injury: Teeth and Oropharynx as per pre-operative assessment

## 2024-06-23 NOTE — Transfer of Care (Signed)
 Immediate Anesthesia Transfer of Care Note  [name]  Procedure(s) Performed: BRACHIOCEPHALIC ARTERIOVENOUS (AV) FISTULA CREATION (Left: Arm Upper)  Patient Location: PACU  Anesthesia Type:General  Level of Consciousness: drowsy  Airway & Oxygen Therapy: Patient Spontanous Breathing  Post-op Assessment: Report given to RN and Post -op Vital signs reviewed and stable  Post vital signs: Reviewed and stable  Last Vitals:  Vitals Value Taken Time  BP 171/71 06/23/24 12:07  Temp 98   Pulse 61 06/23/24 12:09  Resp 9 06/23/24 12:09  SpO2 100 % 06/23/24 12:09  Vitals shown include unfiled device data.  Last Pain:  Vitals:   06/23/24 0902  TempSrc:   PainSc: 0-No pain         Complications: No notable events documented.

## 2024-06-23 NOTE — H&P (Signed)
 History and Physical Interval Note:  06/23/2024 10:39 AM  Oscar Rose  has presented today for surgery, with the diagnosis of ESRD.  The various methods of treatment have been discussed with the patient and family. After consideration of risks, benefits and other options for treatment, the patient has consented to  Procedure(s): ARTERIOVENOUS (AV) FISTULA CREATION (Left) as a surgical intervention.  The patient's history has been reviewed, patient examined, no change in status, stable for surgery.  I have reviewed the patient's chart and labs.  Questions were answered to the patient's satisfaction.    Left arm AVF.  Oscar Rose     Patient name: Oscar Rose MRN: [id number]        DOB: 08/24/1960            Sex: male   REASON FOR CONSULT: Permanent access creation   HPI: Oscar Rose is a 63 y.o. male, with history of CKD stage IV, hypertension, hyperlipidemia, PVD, diabetes, carotid artery disease that presents for evaluation of permanent dialysis access.  Patient is not on dialysis at this time.  No prior dialysis access in the past.  States he is right-handed.  Currently in a nursing facility.   Patient is well-known to vascular surgery and has had prior bilateral lower extremity tibial interventions for critical limb ischemia.  All of his lower extremity tissue loss is healed.       Past Medical History:  Diagnosis Date   Anemia     ARDS (adult respiratory distress syndrome) (HCC)     Arthritis     At risk for sleep apnea      STOP-BANG= 7    SENT TO PCP 06-29-2014   CKD (chronic kidney disease), stage II      montitored by nephrologist   Depression          Diabetic neuropathy (HCC)     GERD (gastroesophageal reflux disease)     Headache      SINUS   History of kidney stones     History of retinal detachment     Hyperlipidemia     Hypertension     Legal blindness of left eye, as defined in [place]      SECONDARY TO RETAINAL DETACHMENT   Neuropathy     PVD  (peripheral vascular disease)     Restless leg syndrome     Retained ureteral stent      w/ encrustation SINCE 2012   Rotator cuff syndrome of right shoulder     Sleep apnea in adult     Stroke [facility])      ?  STROKE  JULY  2017   Tinea unguium     Type 2 diabetes mellitus (HCC)     Vitamin D deficiency                 Past Surgical History:  Procedure Laterality Date   ABDOMINAL AORTOGRAM W/LOWER EXTREMITY N/A 11/08/2021    Procedure: ABDOMINAL AORTOGRAM W/LOWER EXTREMITY;  Surgeon: Rose Oscar JINNY, MD;  [facility];  Service: Cardiovascular;  Laterality: N/A;   ABDOMINAL AORTOGRAM W/LOWER EXTREMITY Right 11/07/2022    Procedure: ABDOMINAL AORTOGRAM W/LOWER EXTREMITY;  Surgeon: Rose Oscar JINNY, MD;  Location: [facility];  Service: Cardiovascular;  Laterality: Right;   AMPUTATION Bilateral 2012    Left big toe partial and right big toe complete   BIOPSY   03/11/2023    Procedure: BIOPSY;  Surgeon: Eartha Flavors,  Toribio, MD;  Location: [facility];  Service: Gastroenterology;;  upper and lower bx's   CARDIOVASCULAR STRESS TEST   04-05-2014  dr croitoru    low risk lexiscan  nuclear study with mild diaphragmatic attenuation artifact/  no ischemia/  ef 58%   CATARACT EXTRACTION W/ INTRAOCULAR LENS  IMPLANT, BILATERAL   2013   COLONOSCOPY WITH PROPOFOL  N/A 03/11/2023    Procedure: COLONOSCOPY WITH PROPOFOL ;  Surgeon: Eartha Angelia Toribio, MD;  [facility];  Service: Gastroenterology;  Laterality: N/A;  11:45 AM;ASA 3   CYSTO /  LEFT URETERAL STENT PLACEMENT   2012   CYSTOSCOPY W/ URETERAL STENT REMOVAL Left 07/07/2014    Procedure: CYSTO WITH LEFT PORTION STENT REMOVAL;  Surgeon: Norleen Rose Seltzer, MD;  Location: [facility];  Service: Urology;  Laterality: Left;   CYSTOSCOPY WITH URETEROSCOPY AND STENT PLACEMENT Left 07/07/2014    Procedure: CYSTOLITHALOPAXY URETEROSCOPY WITH STENT;  Surgeon: Norleen Rose Seltzer, MD;  Location: [facility];  Service: Urology;  Laterality: Left;   ENDARTERECTOMY Right 04/04/2016    Procedure: ENDARTERECTOMY CAROTID ARTERY RIGHT;  Surgeon: Oscar GORMAN Blade, MD;  Location: [facility] OR;  Service: Vascular;  Laterality: Right;   ESOPHAGOGASTRODUODENOSCOPY (EGD) WITH PROPOFOL  N/A 03/11/2023    Procedure: ESOPHAGOGASTRODUODENOSCOPY (EGD) WITH PROPOFOL ;  Surgeon: Eartha Angelia Toribio, MD;  [facility];  Service: Gastroenterology;  Laterality: N/A;  11:45 AM;ASA 3   HOLMIUM LASER APPLICATION N/A 07/07/2014    Procedure: HOLMIUM LASER APPLICATION;  Surgeon: Norleen Rose Seltzer, MD;  Location: [facility];  Service: Urology;  Laterality: N/A;   I & D  INFECTED SPIDER BITE UPPER BACK   06/ 2012   NEPHROLITHOTOMY Left 08/16/2014    Procedure: LEFT NEPHROLITHOTOMY PERCUTANEOUS;  Surgeon: Norleen Rose Seltzer, MD;  Location: [facility];  Service: Urology;  Laterality: Left;   NEPHROLITHOTOMY Left 08/18/2014    Procedure: LEFT NEPHROLITHOTOMY PERCUTANEOUS SECOND LOOK;  Surgeon: Norleen Rose Seltzer, MD;  Location: [facility];  Service: Urology;  Laterality: Left;   PATCH ANGIOPLASTY Right 04/04/2016    Procedure: PATCH ANGIOPLASTY RIGHT CAROTID ARTERY;  Surgeon: Oscar GORMAN Blade, MD;  Location: [facility] OR;  Service: Vascular;  Laterality: Right;   PERIPHERAL VASCULAR BALLOON ANGIOPLASTY   11/08/2021    Procedure: PERIPHERAL VASCULAR BALLOON ANGIOPLASTY;  Surgeon: Oscar Oscar JINNY, MD;  [facility];  Service: Cardiovascular;;  Left AT   PERIPHERAL VASCULAR BALLOON ANGIOPLASTY Right 11/07/2022    Procedure: PERIPHERAL VASCULAR BALLOON ANGIOPLASTY;  Surgeon: Oscar Oscar JINNY, MD;  [facility];  Service: Cardiovascular;  Laterality: Right;  right TP trunk and PT   POLYPECTOMY   03/11/2023    Procedure: POLYPECTOMY;  Surgeon: Eartha Angelia Toribio, MD;  [facility];  Service: Gastroenterology;;   RETINAL DETACHMENT SURGERY Bilateral 2013   TONSILLECTOMY  AND ADENOIDECTOMY   as child               Family History  Problem Relation Age of Onset   Emphysema Mother     Cancer Mother          Throat   Heart disease Mother     Stroke Neg Hx            SOCIAL HISTORY: Social History         Socioeconomic History   Marital status: Single      Spouse name: Not on file   Number of children: 0  Years of education: Not on file   Highest education level: Not on file  Occupational History   Not on file  Tobacco Use   Smoking status: Never   Smokeless tobacco: Never  Vaping Use   Vaping status: Never Used  Substance and Sexual Activity   Alcohol use: Not Currently   Drug use: No   Sexual activity: Not on file  Other Topics Concern   Not on file  Social History Narrative    Lives with wife         Update 11/05/2022    Lives at [facility] rehab currently    Right handed    Caffeine: none     Social Drivers of Acupuncturist Strain: Low Risk  (09/05/2021)    Received from [facility]    Overall Financial Resource Strain (CARDIA)     Difficulty of Paying Living Expenses: Not hard at all  Food Insecurity: Low Risk  (10/10/2023)    Received from [facility]    Hunger Vital Sign     Within the past 12 months, you worried that your food would run out before you got money to buy more: Never true     Within the past 12 months, the food you bought just didn't last and you didn't have money to get more. : Never true  Transportation Needs: No Transportation Needs (10/10/2023)    Received from [employer]     In the past 12 months, has lack of reliable transportation kept you from medical appointments, meetings, work or from getting things needed for daily living? : No  Physical Activity: Inactive (09/05/2021)    Received from [facility]    Exercise Vital Sign     On average, how many days per week do you engage in moderate to strenuous exercise (like a brisk walk)?: 0 days     On  average, how many minutes do you engage in exercise at this level?: 0 min  Stress: No Stress Concern Present (09/05/2021)    Received from [facility] - Occupational Stress Questionnaire     Feeling of Stress : Not at all  Social Connections: Unknown (09/05/2021)    Received from [facility]    Social Connection and Isolation Panel     In a typical week, how many times do you talk on the phone with family, friends, or neighbors?: Once a week     How often do you get together with friends or relatives?: Once a week     How often do you attend church or religious services?: 1 to 4 times per year     Do you belong to any clubs or organizations such as church groups, unions, fraternal or athletic groups, or school groups?: No     How often do you attend meetings of the clubs or organizations you belong to?: 1 to 4 times per year     Marital Status: Not on file  Intimate Partner Violence: Not At Risk (05/13/2022)    Humiliation, Afraid, Rape, and Kick questionnaire     Fear of Current or Ex-Partner: No     Emotionally Abused: No     Physically Abused: No     Sexually Abused: No      Allergies       Allergies  Allergen Reactions   Cefepime  Other (See Comments)  Other Reaction(s): Flushing   Myoclonus   Myoclonus    Other Reaction(s): Flushing  Myoclonus              Current Outpatient Medications  Medication Sig Dispense Refill   acetaminophen  (TYLENOL ) 650 MG CR tablet Take 650 mg by mouth every 4 (four) hours as needed for pain.       albuterol  (VENTOLIN  HFA) 108 (90 Base) MCG/ACT inhaler Inhale into the lungs.       Ascorbic Acid (VITAMIN C PO) Take 500 mg by mouth 2 (two) times daily.       aspirin  EC 81 MG tablet Take 81 mg by mouth daily.       atorvastatin  (LIPITOR) 40 MG tablet Take 40 mg by mouth at bedtime.       Baclofen  5 MG TABS Take 1 tablet (5 mg total) by mouth 3 (three) times daily as needed (muscle spasm). 90  tablet     busPIRone  (BUSPAR ) 5 MG tablet Take 5 mg by mouth 2 (two) times daily.       cloNIDine  (CATAPRES  - DOSED IN MG/24 HR) 0.3 mg/24hr patch Place 1 patch (0.3 mg total) onto the skin once a week. 4 patch 12   clopidogrel  (PLAVIX ) 75 MG tablet Take 75 mg by mouth daily.       ferrous sulfate  325 (65 FE) MG tablet Take 325 mg by mouth daily with breakfast.       furosemide  (LASIX ) 40 MG tablet Take 1 tablet (40 mg total) by mouth daily. 30 tablet 1   GLUCAGON, RDNA, IJ Inject as directed. Prn  Hypoglycemia (Patient not taking: Reported on 04/26/2024)       guaiFENesin  (ROBITUSSIN) 100 MG/5ML liquid Take 5 mLs by mouth every 4 (four) hours as needed for cough or to loosen phlegm. (Patient not taking: Reported on 04/26/2024)       hydrALAZINE  (APRESOLINE ) 50 MG tablet Take 50 mg by mouth.       insulin  glargine-yfgn (SEMGLEE ) 100 UNIT/ML Pen         ipratropium-albuterol  (DUONEB) 0.5-2.5 (3) MG/3ML SOLN         labetalol  (NORMODYNE ) 200 MG tablet Take 1 tablet (200 mg total) by mouth 2 (two) times daily. (Patient taking differently: Take 200 mg by mouth 3 (three) times daily.) 60 tablet 0   loperamide (IMODIUM A-D) 2 MG tablet Take 2 mg by mouth 4 (four) times daily as needed for diarrhea or loose stools. 2 caplets initially, then 1 caplet following each loose stool.Do not exceed 4 caplets in a 24 hour period. (Patient not taking: Reported on 04/26/2024)       meclizine (ANTIVERT) 25 MG tablet Take 25 mg by mouth every 8 (eight) hours.       mineral oil-hydrophilic petrolatum (AQUAPHOR) ointment Apply 1 Application topically at bedtime.       Multiple Vitamin (MULTIVITAMIN) tablet Take 1 tablet by mouth daily.       ondansetron  (ZOFRAN -ODT) 4 MG disintegrating tablet Take 4 mg by mouth every 4 (four) hours as needed for nausea or vomiting. If not resolved after two doses then Phenergan  25 mg Q 6 hours prn. (Patient not taking: Reported on 04/26/2024)       OVER THE COUNTER MEDICATION Fleets enema x  1, if no results from above call the MD   Mylanta 30 cc po Q 2 hours prn acid indigestion. Do not exceed 4 doses in 24 hours.   Pro stat 64 Oral liquid 30  ml in the am's for wound healing. (Patient not taking: Reported on 04/26/2024)       pantoprazole  (PROTONIX ) 40 MG tablet Take 1 tablet (40 mg total) by mouth daily. 90 tablet 3   SANTYL 250 UNIT/GM ointment Apply topically. (Patient not taking: Reported on 04/26/2024)       sertraline  (ZOLOFT ) 25 MG tablet Take 1 tablet (25 mg total) by mouth daily. 30 tablet 0   sodium bicarbonate 650 MG tablet Take 650 mg by mouth.       sodium zirconium cyclosilicate  (LOKELMA ) 10 g PACK packet Take 10 g by mouth. One every morning on M,W,F for Hyperkalemia.       traMADol  (ULTRAM ) 50 MG tablet Take 1 tablet (50 mg total) by mouth 2 (two) times daily as needed for moderate pain. 10 tablet 0   VELTASSA 8.4 g packet Take 1 packet by mouth daily.          No current facility-administered medications for this visit.        REVIEW OF SYSTEMS:  [X]  denotes positive finding, [ ]  denotes negative finding Cardiac   Comments:  Chest pain or chest pressure:      Shortness of breath upon exertion:      Short of breath when lying flat:      Irregular heart rhythm:             Vascular      Pain in calf, thigh, or hip brought on by ambulation:      Pain in feet at night that wakes you up from your sleep:       Blood clot in your veins:      Leg swelling:              Pulmonary      Oxygen at home:      Productive cough:       Wheezing:              Neurologic      Sudden weakness in arms or legs:       Sudden numbness in arms or legs:       Sudden onset of difficulty speaking or slurred speech:      Temporary loss of vision in one eye:       Problems with dizziness:              Gastrointestinal      Blood in stool:       Vomited blood:              Genitourinary      Burning when urinating:       Blood in urine:             Psychiatric       Major depression:              Hematologic      Bleeding problems:      Problems with blood clotting too easily:             Skin      Rashes or ulcers:             Constitutional      Fever or chills:          PHYSICAL EXAM: There were no vitals filed for this visit.   GENERAL: The patient is a well-nourished male, in no acute distress. The vital signs are documented above. CARDIAC: There is a regular rate  and rhythm.  VASCULAR:  Bilateral radial and brachial pulses palpable PULMONARY: No respiratory distress. ABDOMEN: Soft and non-tender. MUSCULOSKELETAL: There are no major deformities or cyanosis. NEUROLOGIC: No focal weakness or paresthesias are detected. SKIN: There are no ulcers or rashes noted. PSYCHIATRIC: The patient has a normal affect.   DATA:    Upper extremity vein mapping shows usable cephalic and basilic vein in the left arm   Assessment/Plan:    63 y.o. male, with history of CKD stage IV, hypertension, hyperlipidemia, PVD, diabetes, carotid artery disease that presents for evaluation of permanent dialysis access.  Discussed plans for access in the non-dominant arm which would be his left arm.  He does have a usable cephalic and basilic vein.  I discussed this being done as an outpatient [facility].  I discussed this taking 3 months to mature.  Discussed risk and benefits including failure to mature, steal syndrome, bleeding, infection.  Will get scheduled at his convenience.  Will need to call [facility] to schedule.     Oscar DOROTHA Gaskins, MD [facility] [place] Office: [phone]

## 2024-06-23 NOTE — Discharge Instructions (Signed)
[facility]  Discharge Instructions  AV Fistula or Graft Surgery for Dialysis Access  Please refer to the following instructions for your post-procedure care. Your surgeon or physician assistant will discuss any changes with you.  Activity  You may drive the day following your surgery, if you are comfortable and no longer taking prescription pain medication. Resume full activity as the soreness in your incision resolves.  Bathing/Showering  You may shower after you go home. Keep your incision dry for 48 hours. Do not soak in a bathtub, hot tub, or swim until the incision heals completely. You may not shower if you have a hemodialysis catheter.  Incision Care  Clean your incision with mild soap and water after 48 hours. Pat the area dry with a clean towel. You do not need a bandage unless otherwise instructed. Do not apply any ointments or creams to your incision. You may have skin glue on your incision. Do not peel it off. It will come off on its own in about one week. Your arm may swell a bit after surgery. To reduce swelling use pillows to elevate your arm so it is above your heart. Your doctor will tell you if you need to lightly wrap your arm with an ACE bandage.  Diet  Resume your normal diet. There are not special food restrictions following this procedure. In order to heal from your surgery, it is CRITICAL to get adequate nutrition. Your body requires vitamins, minerals, and protein. Vegetables are the best source of vitamins and minerals. Vegetables also provide the perfect balance of protein. Processed food has little nutritional value, so try to avoid this.  Medications  Resume taking all of your medications. If your incision is causing pain, you may take over-the counter pain relievers such as acetaminophen  (Tylenol ). If you were prescribed a stronger pain medication, please be aware these medications can cause nausea and constipation. Prevent  nausea by taking the medication with a snack or meal. Avoid constipation by drinking plenty of fluids and eating foods with high amount of fiber, such as fruits, vegetables, and grains.  Do not take Tylenol  if you are taking prescription pain medications.  Follow up Your surgeon may want to see you in the office following your access surgery. If so, this will be arranged at the time of your surgery.  Please call us  immediately for any of the following conditions:  Increased pain, redness, drainage (pus) from your incision site Fever of 101 degrees or higher Severe or worsening pain at your incision site Hand pain or numbness.  Reduce your risk of vascular disease:  Stop smoking. If you would like help, call [facility] at 1-800-QUIT-NOW (262-053-3376) or [place] at 209 599 5262  Manage your cholesterol Maintain a desired weight Control your diabetes Keep your blood pressure down  Dialysis  It will take several weeks to several months for your new dialysis access to be ready for use. Your surgeon will determine when it is okay to use it. Your nephrologist will continue to direct your dialysis. You can continue to use your Permcath until your new access is ready for use.   06/23/2024 Oscar Rose [id number] [birth year]  Surgeon(s): Gretta Lonni PARAS, MD  Procedure(s): BRACHIOCEPHALIC ARTERIOVENOUS (AV) FISTULA CREATION   May stick graft immediately   May stick graft on designated area only:   X Do not stick left AV fistula for 12 weeks    If you have any questions, please call the office at  [phone]. 

## 2024-06-23 NOTE — Op Note (Signed)
 OPERATIVE NOTE   DATE: June 23, 2024  PROCEDURE: left brachiocephalic arteriovenous fistula placement  PRE-OPERATIVE DIAGNOSIS: chronic kidney disease stage IV  POST-OPERATIVE DIAGNOSIS: same as above   SURGEON: Lonni DOROTHA Gaskins, MD  ASSISTANT(S): Curry Damme, PA  ANESTHESIA: regional  ESTIMATED BLOOD LOSS: Minimal  FINDING(S): 1.  Cephalic vein: 4 mm, acceptable 2.  Brachial artery: 4 mm, atherosclerotic disease evident 3.  Venous outflow: palpable thrill  4.  Radial flow: palpable radial pulse  SPECIMEN(S):  none  INDICATIONS:   Oscar Rose is a 63 y.o. male who presents with CKD stage IV and the need for permanent hemodialysis access.  The patient is scheduled for left arm arteriovenous fistula placement.  The patient is aware the risks include but are not limited to: bleeding, infection, steal syndrome, nerve damage, ischemic monomelic neuropathy, failure to mature, and need for additional procedures.  The patient is aware of the risks of the procedure and elects to proceed forward.  An assistant was needed given the complexity case and also to sew the anastomosis.   DESCRIPTION: After full informed written consent was obtained from the patient, the patient was brought back to the operating room and placed supine upon the operating table.  Prior to induction, the patient received IV antibiotics.   After obtaining adequate anesthesia, the patient was then prepped and draped in the standard fashion for a left arm access procedure.  I turned my attention first to identifying the patient's cephalic vein and brachial artery.  Using SonoSite guidance, the location of these vessels were marked out on the skin.      I made a transverse incision at the level of the antecubitum and dissected through the subcutaneous tissue and fascia to gain exposure of the brachial artery.  This was noted to be 4 mm in diameter externally.  This was dissected out proximally and  distally and controlled with vessel loops .  I then dissected out the cephalic vein.  This was noted to be 4 mm in diameter externally.  The distal segment of the vein was ligated with a  2-0 silk, and the vein was transected.  The proximal segment was interrogated with serial dilators.  The vein accepted up to a 4.5 mm dilator without any difficulty.  I then instilled the heparinized saline into the vein and clamped it.  At this point, I reset my exposure of the brachial artery.  The patient was given 5,000 units IV heparin .  I then placed the artery under tension proximally and distally.  I made an arteriotomy with a #11 blade, and then I extended the arteriotomy with a Potts scissor.  I injected heparinized saline proximal and distal to this arteriotomy.  The vein was then sewn to the artery in an end-to-side configuration with a running stitch of 6-0 Prolene with the help of my assistant.  Prior to completing this anastomosis, I allowed the vein and artery to backbleed.  There was no evidence of clot from any vessels.  I completed the anastomosis in the usual fashion and then released all vessel loops and clamps.    There was a palpable thrill in the venous outflow, and there was a palpable radial pulse.  At this point, I irrigated out the surgical wound.  There was no further active bleeding.  The subcutaneous tissue was reapproximated with a running stitch of 3-0 Vicryl.  The skin was then reapproximated with a running subcuticular stitch of 4-0 Vicryl.  The skin  was then cleaned, dried, and reinforced with Dermabond.  The patient tolerated this procedure well.   COMPLICATIONS: None  CONDITION: Stable  Lonni DOROTHA Gaskins, MD [facility] Office: [phone]  [name]   06/23/2024, 12:04 PM

## 2024-06-24 ENCOUNTER — Encounter (HOSPITAL_COMMUNITY): Payer: Self-pay | Admitting: Vascular Surgery

## 2024-06-28 ENCOUNTER — Other Ambulatory Visit: Payer: Self-pay

## 2024-06-28 DIAGNOSIS — N186 End stage renal disease: Secondary | ICD-10-CM

## 2024-07-08 ENCOUNTER — Encounter (INDEPENDENT_AMBULATORY_CARE_PROVIDER_SITE_OTHER): Payer: Self-pay | Admitting: Gastroenterology

## 2024-07-08 ENCOUNTER — Ambulatory Visit (INDEPENDENT_AMBULATORY_CARE_PROVIDER_SITE_OTHER): Admitting: Gastroenterology

## 2024-07-08 VITALS — BP 105/62 | HR 62 | Temp 97.1°F | Ht 70.5 in | Wt 255.6 lb

## 2024-07-08 DIAGNOSIS — R110 Nausea: Secondary | ICD-10-CM | POA: Diagnosis not present

## 2024-07-08 DIAGNOSIS — R109 Unspecified abdominal pain: Secondary | ICD-10-CM | POA: Diagnosis not present

## 2024-07-08 DIAGNOSIS — R140 Abdominal distension (gaseous): Secondary | ICD-10-CM | POA: Diagnosis not present

## 2024-07-08 DIAGNOSIS — R197 Diarrhea, unspecified: Secondary | ICD-10-CM | POA: Diagnosis not present

## 2024-07-08 MED ORDER — ONDANSETRON 4 MG PO TBDP: 4.0000 mg | ORAL_TABLET | Freq: Three times a day (TID) | ORAL | 1 refills | Status: AC | PRN

## 2024-07-08 NOTE — Progress Notes (Signed)
 Toribio Fortune, M.D. Gastroenterology & Hepatology [facility] Gastroenterology [address] [place], [place] [address]  Primary Care Physician: Wilhelmina Cedar [address] [place] [address]  I will communicate my assessment and recommendations to the referring MD via EMR.  Problems: Nausea Diarrhea History of gastric ulcer  History of Present Illness: Oscar Rose is a 63 y.o. male with past medical history of ARDS, end-stage renal disease on dialysis, depression, neuropathy, GERD, HLD, HTN, legal blindness of L eye, PVD s/p angioplasty and bilateral toe amputation, prior CVA status post right CEA,, RLS, sleep apnea, stroke, Type 2 DM, who presents for evaluation of bloating, abdominal pain and diarrhea.  The patient was last seen on 06/10/2023. At that time, the patient was being evaluated for iron deficiency anemia.  Was advised to continue taking iron daily and to follow-up with hematology.  Also to continue Protonix  40 mg daily.  Given issues with anemia and history of ulcer he was advised to repeat EGD but he canceled this procedure 2 times.  Patient reports that for the last 4 months he has presented issues with intermittent diarrhea, possibly couple of days a week which can vary in consistency between soft to watery. He may have 4-5 bowels movements when this happens. He reports that he has presented issues with fecal soiling, even when sleeping. Reports that when taking Imodium, he may improve after takign 4 pills a day possibly.  Has been having significant flatulence. No abdominal bloating or abdominal pain. He reports having some nausea but does not vomit.  The patient denies having any nausea, vomiting, fever, chills, hematochezia, melena, hematemesis, abdominal distention, abdominal pain, diarrhea, jaundice, pruritus or weight loss. No issues with constipation.  Recently seen by cardiology on 06/18/2024.  Was endorsing some shortness of  breath which was considered to be possibly related to cardiac ischemia given findings of ischemia on noninvasive stress test in 2024.  Was advised to continue pharmacological management and follow-up in 2 months.  Most recent labs from 04/26/2024 showed normal ferritin 317 iron of 66, saturation 26% and hemoglobin of 12.3.  Patient has had some of his BP meds changed as he had issues with dizziness.                                                                                       Last Colonoscopy:03/2023- One 7 mm polyp in the cecum, removed with a cold                            snare. Resected and retrieved.                           - Two 1 to 2 mm polyps in the ascending colon,                            removed with a cold biopsy forceps. Resected and  retrieved.                           - Non-bleeding internal hemorrhoids.   Last Endoscopy: 03/2023- Normal esophagus.                           - Erosive gastropathy with no stigmata of recent                            bleeding. Biopsied.                           - Non-bleeding gastric ulcer with a clean ulcer                            base (Forrest Class III). Biopsied.                           - Erythematous duodenopathy. SABRA STOMACH, BIOPSY:  - Mild reactive gastropathy.  - Negative for H. pylori on HE stain  - No intestinal metaplasia, dysplasia, or malignancy.   B. STOMACH ULCER, BIOPSY:  - Marked reactive gastropathy  - Negative for H. pylori on HE and immunohistochemical stain  - Negative for test metaplasia, dysplasia or malignancy   C. COLON, CECUM, ASCENDING, POLYPECTOMY:  - Tubular adenoma.  - No high grade dysplasia or malignancy.  - Sessile serrated polyp.  - No dysplasia or malignancy.    Recommendations:  Repeat TCS 5 years   Past Medical History: Past Medical History:  Diagnosis Date   Anemia    ARDS (adult respiratory distress syndrome) (HCC)    Arthritis    At risk for  sleep apnea    STOP-BANG= 7    SENT TO PCP 06-29-2014   CKD (chronic kidney disease), stage II    montitored by nephrologist   Depression        Diabetic neuropathy (HCC)    GERD (gastroesophageal reflux disease)    Headache    SINUS   History of kidney stones    History of retinal detachment    Hyperkalemia    Hyperlipidemia    Hypertension    Legal blindness of left eye, as defined in [place]    SECONDARY TO RETAINAL DETACHMENT   Neuropathy    PVD (peripheral vascular disease)    Restless leg syndrome    Retained ureteral stent    w/ encrustation SINCE 2012   Rotator cuff syndrome of right shoulder    Sleep apnea in adult    Stroke [facility])    ?  STROKE  JULY  2017   Tinea unguium    Type 2 diabetes mellitus (HCC)    Vitamin D deficiency     Past Surgical History: Past Surgical History:  Procedure Laterality Date   ABDOMINAL AORTOGRAM W/LOWER EXTREMITY N/A 11/08/2021   Procedure: ABDOMINAL AORTOGRAM W/LOWER EXTREMITY;  Surgeon: Gretta Lonni PARAS, MD;  [facility];  Service: Cardiovascular;  Laterality: N/A;   ABDOMINAL AORTOGRAM W/LOWER EXTREMITY Right 11/07/2022   Procedure: ABDOMINAL AORTOGRAM W/LOWER EXTREMITY;  Surgeon: Gretta Lonni PARAS, MD;  Location: [facility];  Service: Cardiovascular;  Laterality: Right;   AMPUTATION Bilateral 2012   Left big toe partial and right big toe complete  AV FISTULA PLACEMENT Left 06/23/2024   Procedure: BRACHIOCEPHALIC ARTERIOVENOUS (AV) FISTULA CREATION;  Surgeon: Gretta Lonni PARAS, MD;  Location: [facility] OR;  Service: Vascular;  Laterality: Left;   BIOPSY  03/11/2023   Procedure: BIOPSY;  Surgeon: Eartha Flavors, Toribio, MD;  [facility]: [facility];  Service: Gastroenterology;;  upper and lower bx's   CARDIOVASCULAR STRESS TEST  04-05-2014  dr croitoru   low risk lexiscan  nuclear study with mild diaphragmatic attenuation artifact/  no ischemia/  ef 58%   CATARACT EXTRACTION W/ INTRAOCULAR LENS  IMPLANT,  BILATERAL  2013   COLONOSCOPY WITH PROPOFOL  N/A 03/11/2023   Procedure: COLONOSCOPY WITH PROPOFOL ;  Surgeon: Eartha Flavors Toribio, MD;  [facility]: [facility];  Service: Gastroenterology;  Laterality: N/A;  11:45 AM;ASA 3   CYSTO /  LEFT URETERAL STENT PLACEMENT  2012   CYSTOSCOPY W/ URETERAL STENT REMOVAL Left 07/07/2014   Procedure: CYSTO WITH LEFT PORTION STENT REMOVAL;  Surgeon: Norleen PARAS Seltzer, MD;  Location: [facility];  Service: Urology;  Laterality: Left;   CYSTOSCOPY WITH URETEROSCOPY AND STENT PLACEMENT Left 07/07/2014   Procedure: CYSTOLITHALOPAXY URETEROSCOPY WITH STENT;  Surgeon: Norleen PARAS Seltzer, MD;  Location: [facility];  Service: Urology;  Laterality: Left;   ENDARTERECTOMY Right 04/04/2016   Procedure: ENDARTERECTOMY CAROTID ARTERY RIGHT;  Surgeon: Lonni GORMAN Blade, MD;  Location: [facility] OR;  Service: Vascular;  Laterality: Right;   ESOPHAGOGASTRODUODENOSCOPY (EGD) WITH PROPOFOL  N/A 03/11/2023   Procedure: ESOPHAGOGASTRODUODENOSCOPY (EGD) WITH PROPOFOL ;  Surgeon: Eartha Flavors Toribio, MD;  [facility];  Service: Gastroenterology;  Laterality: N/A;  11:45 AM;ASA 3   HOLMIUM LASER APPLICATION N/A 07/07/2014   Procedure: HOLMIUM LASER APPLICATION;  Surgeon: Norleen PARAS Seltzer, MD;  Location: [facility];  Service: Urology;  Laterality: N/A;   I & D  INFECTED SPIDER BITE UPPER BACK  06/ 2012   NEPHROLITHOTOMY Left 08/16/2014   Procedure: LEFT NEPHROLITHOTOMY PERCUTANEOUS;  Surgeon: Norleen PARAS Seltzer, MD;  Location: [facility];  Service: Urology;  Laterality: Left;   NEPHROLITHOTOMY Left 08/18/2014   Procedure: LEFT NEPHROLITHOTOMY PERCUTANEOUS SECOND LOOK;  Surgeon: Norleen PARAS Seltzer, MD;  Location: [facility];  Service: Urology;  Laterality: Left;   PATCH ANGIOPLASTY Right 04/04/2016   Procedure: PATCH ANGIOPLASTY RIGHT CAROTID ARTERY;  Surgeon: Lonni GORMAN Blade, MD;  Location: [facility] OR;  Service: Vascular;  Laterality: Right;   PERIPHERAL VASCULAR  BALLOON ANGIOPLASTY  11/08/2021   Procedure: PERIPHERAL VASCULAR BALLOON ANGIOPLASTY;  Surgeon: Gretta Lonni PARAS, MD;  [facility];  Service: Cardiovascular;;  Left AT   PERIPHERAL VASCULAR BALLOON ANGIOPLASTY Right 11/07/2022   Procedure: PERIPHERAL VASCULAR BALLOON ANGIOPLASTY;  Surgeon: Gretta Lonni PARAS, MD;  Location: [facility];  Service: Cardiovascular;  Laterality: Right;  right TP trunk and PT   POLYPECTOMY  03/11/2023   Procedure: POLYPECTOMY;  Surgeon: Eartha Flavors Toribio, MD;  [facility];  Service: Gastroenterology;;   RETINAL DETACHMENT SURGERY Bilateral 2013   TONSILLECTOMY AND ADENOIDECTOMY  as child    Family History: Family History  Problem Relation Age of Onset   Emphysema Mother    Cancer Mother        Throat   Heart disease Mother    Stroke Neg Hx     Social History: Social History   Tobacco Use  Smoking Status Never  Smokeless Tobacco Never   Social History   Substance and Sexual Activity  Alcohol Use Not Currently   Social History  Substance and Sexual Activity  Drug Use No    Allergies: Allergies  Allergen Reactions   Cefepime  Other (See Comments)    Other Reaction(s): Flushing  Myoclonus  Myoclonus    Other Reaction(s): Flushing  Myoclonus    Medications: Current Outpatient Medications  Medication Sig Dispense Refill   acetaminophen  (TYLENOL ) 650 MG CR tablet Take 650 mg by mouth every 4 (four) hours as needed for pain.     Ascorbic Acid (VITAMIN C PO) Take 500 mg by mouth 2 (two) times daily.     aspirin  EC 81 MG tablet Take 81 mg by mouth daily.     atorvastatin  (LIPITOR) 40 MG tablet Take 40 mg by mouth at bedtime.     Baclofen  5 MG TABS Take 1 tablet (5 mg total) by mouth 3 (three) times daily as needed (muscle spasm). 90 tablet    busPIRone  (BUSPAR ) 5 MG tablet Take 5 mg by mouth 2 (two) times daily. (Patient taking differently: Take 5 mg by mouth daily.)     cloNIDine  (CATAPRES  -  DOSED IN MG/24 HR) 0.3 mg/24hr patch Place 1 patch (0.3 mg total) onto the skin once a week. 4 patch 12   clopidogrel  (PLAVIX ) 75 MG tablet Take 75 mg by mouth daily.     ferrous sulfate  325 (65 FE) MG tablet Take 325 mg by mouth daily with breakfast.     furosemide  (LASIX ) 40 MG tablet Take 1 tablet (40 mg total) by mouth daily. 30 tablet 1   hydrALAZINE  (APRESOLINE ) 50 MG tablet Take 50 mg by mouth. (Patient taking differently: Take 50 mg by mouth. Every 6 hours prn HTN systolic bp greater that 170)     insulin  glargine-yfgn (SEMGLEE ) 100 UNIT/ML Pen at bedtime as needed.     labetalol  (NORMODYNE ) 200 MG tablet Take 200 mg by mouth 3 (three) times daily.     loperamide (IMODIUM A-D) 2 MG tablet Take 2 mg by mouth 4 (four) times daily as needed for diarrhea or loose stools.     magnesium  hydroxide (MILK OF MAGNESIA) 400 MG/5ML suspension Take by mouth daily as needed for mild constipation.     meclizine (ANTIVERT) 25 MG tablet Take 50 mg by mouth every 8 (eight) hours.     mineral oil enema Place 1 enema rectally as needed for severe constipation.     Multiple Vitamin (MULTIVITAMIN) tablet Take 1 tablet by mouth daily.     NIFEdipine (PROCARDIA-XL/NIFEDICAL-XL) 30 MG 24 hr tablet Take 30 mg by mouth daily. 1/2 tablet daily.     ondansetron  (ZOFRAN -ODT) 4 MG disintegrating tablet Take 4 mg by mouth every 4 (four) hours as needed for nausea or vomiting.     pantoprazole  (PROTONIX ) 40 MG tablet Take 1 tablet (40 mg total) by mouth daily. 90 tablet 3   sertraline  (ZOLOFT ) 25 MG tablet Take 1 tablet (25 mg total) by mouth daily. 30 tablet 0   sodium bicarbonate 650 MG tablet Take 650 mg by mouth. (Patient taking differently: Take 650 mg by mouth 3 (three) times daily.)     sodium zirconium cyclosilicate  (LOKELMA ) 10 g PACK packet Take 10 g by mouth. One every morning on M,W,F for Hyperkalemia.     traZODone  (DESYREL ) 50 MG tablet Take 50 mg by mouth at bedtime.     VELTASSA 8.4 g packet Take 1 packet  by mouth daily.     No current facility-administered medications for this visit.    Review of Systems: GENERAL: negative for malaise, night  sweats HEENT: No changes in hearing or vision, no nose bleeds or other nasal problems. NECK: Negative for lumps, goiter, pain and significant neck swelling RESPIRATORY: Negative for cough, wheezing CARDIOVASCULAR: Negative for chest pain, leg swelling, palpitations, orthopnea GI: SEE HPI MUSCULOSKELETAL: Negative for joint pain or swelling, back pain, and muscle pain. SKIN: Negative for lesions, rash PSYCH: Negative for sleep disturbance, mood disorder and recent psychosocial stressors. HEMATOLOGY Negative for prolonged bleeding, bruising easily, and swollen nodes. ENDOCRINE: Negative for cold or heat intolerance, polyuria, polydipsia and goiter. NEURO: negative for tremor, gait imbalance, syncope and seizures. The remainder of the review of systems is noncontributory.   Physical Exam: BP 105/62 (BP Location: Right Arm, Patient Position: Sitting, Cuff Size: Normal)   Pulse 62   Temp (!) 97.1 F (36.2 C) (Temporal)   Ht 5' 10.5 (1.791 m)   Wt 255 lb 9.6 oz (115.9 kg)   BMI 36.16 kg/m  GENERAL: The patient is AO x3, in no acute distress.  Sitting wheelchair. HEENT: Head is normocephalic and atraumatic. EOMI are intact. Mouth is well hydrated and without lesions. NECK: Supple. No masses LUNGS: Clear to auscultation. No presence of rhonchi/wheezing/rales. Adequate chest expansion HEART: RRR, normal s1 and s2. ABDOMEN: Soft, nontender, no guarding, no peritoneal signs, and nondistended. BS +. No masses. EXTREMITIES: Without any cyanosis, clubbing, rash, lesions or edema. NEUROLOGIC: AOx3, no focal motor deficit. SKIN: no jaundice, no rashes  Imaging/Labs: as above  I personally reviewed and interpreted the available labs, imaging and endoscopic files.  Impression and Plan: JADARRIUS MASELLI is a 63 y.o. male with past medical history of  ARDS, end-stage renal disease on dialysis, depression, neuropathy, GERD, HLD, HTN, legal blindness of L eye, PVD s/p angioplasty and bilateral toe amputation, prior CVA status post right CEA,, RLS, sleep apnea, stroke, Type 2 DM, who presents for evaluation of bloating, abdominal pain and diarrhea.  Patient has presented chronic intermittent episodes of diarrhea with mild nausea.  No clear triggers or other attributable factors leading to his new onset of symptoms.  Had recent change in blood pressure medications but none that could be leading to diarrhea issues.  At this point, we will perform further serologies and stool based testing to evaluate for infections or celiac disease.  He will benefit from taking Benefiber on a regular basis but also can take Imodium as needed prior to relieve symptoms.  Finally, has been to some mild nausea.  This will be managed for now with Zofran  as needed.  - Check CBC, celiac disease panel, CRP, GI pathogen panel in stool and ova and parasite -Can take Imodium as needed for diarrhea -Start taking Benefiber 1 tablespoon mixed with water daily -Start Zofran  4 mg as needed for nausea every 8 hours  All questions were answered.      Toribio Fortune, MD Gastroenterology and Hepatology [facility] Gastroenterology

## 2024-07-08 NOTE — Patient Instructions (Addendum)
 Perform blood and stool workup Can take Imodium as needed for now whenever presenting diarrhea Start taking Benefiber 1 tablespoon mixed with water daily Start Zofran  4 mg as needed for nausea every 8 hours

## 2024-07-27 ENCOUNTER — Ambulatory Visit (HOSPITAL_COMMUNITY)

## 2024-07-27 ENCOUNTER — Ambulatory Visit (HOSPITAL_COMMUNITY)
Admission: RE | Admit: 2024-07-27 | Discharge: 2024-07-27 | Disposition: A | Discharge status: Home / Self Care | Source: Ambulatory Visit | Attending: Vascular Surgery | Admitting: Vascular Surgery

## 2024-07-27 VITALS — BP 127/68 | HR 63 | Temp 98.0°F | Ht 70.5 in | Wt 252.0 lb

## 2024-07-27 DIAGNOSIS — N184 Chronic kidney disease, stage 4 (severe): Secondary | ICD-10-CM

## 2024-07-27 DIAGNOSIS — E1122 Type 2 diabetes mellitus with diabetic chronic kidney disease: Secondary | ICD-10-CM | POA: Diagnosis present

## 2024-07-27 DIAGNOSIS — N186 End stage renal disease: Secondary | ICD-10-CM | POA: Diagnosis not present

## 2024-07-27 NOTE — Progress Notes (Signed)
 " POST OPERATIVE OFFICE NOTE    CC:  F/u for surgery  HPI: Oscar Rose is a 63 y.o. male who is here for postop visit.  He recently underwent left brachiocephalic AV fistula creation by Dr. Gretta on 06/23/2024.  This was done for permanent dialysis access.  He currently has CKD stage IV and is not on dialysis yet.  He returns today for follow-up.  He has no complaints at today's office visit.  He feels like his left arm incision is well-healed and denies any drainage, fevers, or chills.  He denies any symptoms of steal in the left hand such as weakness, numbness, excessive coldness, or pain.  He still is not on dialysis yet.  Allergies[1]  Current Outpatient Medications  Medication Sig Dispense Refill   acetaminophen  (TYLENOL ) 650 MG CR tablet Take 650 mg by mouth every 4 (four) hours as needed for pain.     Ascorbic Acid (VITAMIN C PO) Take 500 mg by mouth 2 (two) times daily.     aspirin  EC 81 MG tablet Take 81 mg by mouth daily.     atorvastatin  (LIPITOR) 40 MG tablet Take 40 mg by mouth at bedtime.     Baclofen  5 MG TABS Take 1 tablet (5 mg total) by mouth 3 (three) times daily as needed (muscle spasm). 90 tablet    busPIRone  (BUSPAR ) 5 MG tablet Take 5 mg by mouth 2 (two) times daily. (Patient taking differently: Take 5 mg by mouth daily.)     cloNIDine  (CATAPRES  - DOSED IN MG/24 HR) 0.3 mg/24hr patch Place 1 patch (0.3 mg total) onto the skin once a week. 4 patch 12   clopidogrel  (PLAVIX ) 75 MG tablet Take 75 mg by mouth daily.     ferrous sulfate  325 (65 FE) MG tablet Take 325 mg by mouth daily with breakfast.     furosemide  (LASIX ) 40 MG tablet Take 1 tablet (40 mg total) by mouth daily. 30 tablet 1   hydrALAZINE  (APRESOLINE ) 50 MG tablet Take 50 mg by mouth. (Patient taking differently: Take 50 mg by mouth. Every 6 hours prn HTN systolic bp greater that 170)     insulin  glargine-yfgn (SEMGLEE ) 100 UNIT/ML Pen at bedtime as needed.     labetalol  (NORMODYNE ) 200 MG tablet Take 200  mg by mouth 3 (three) times daily.     loperamide (IMODIUM A-D) 2 MG tablet Take 2 mg by mouth 4 (four) times daily as needed for diarrhea or loose stools.     magnesium  hydroxide (MILK OF MAGNESIA) 400 MG/5ML suspension Take by mouth daily as needed for mild constipation.     meclizine (ANTIVERT) 25 MG tablet Take 50 mg by mouth every 8 (eight) hours.     mineral oil enema Place 1 enema rectally as needed for severe constipation.     Multiple Vitamin (MULTIVITAMIN) tablet Take 1 tablet by mouth daily.     NIFEdipine (PROCARDIA-XL/NIFEDICAL-XL) 30 MG 24 hr tablet Take 30 mg by mouth daily. 1/2 tablet daily.     ondansetron  (ZOFRAN -ODT) 4 MG disintegrating tablet Take 1 tablet (4 mg total) by mouth every 8 (eight) hours as needed for nausea or vomiting. 60 tablet 1   pantoprazole  (PROTONIX ) 40 MG tablet Take 1 tablet (40 mg total) by mouth daily. 90 tablet 3   sertraline  (ZOLOFT ) 25 MG tablet Take 1 tablet (25 mg total) by mouth daily. 30 tablet 0   sodium bicarbonate 650 MG tablet Take 650 mg by mouth. (Patient taking differently: Take  650 mg by mouth 3 (three) times daily.)     sodium zirconium cyclosilicate  (LOKELMA ) 10 g PACK packet Take 10 g by mouth. One every morning on M,W,F for Hyperkalemia.     traZODone  (DESYREL ) 50 MG tablet Take 50 mg by mouth at bedtime.     VELTASSA 8.4 g packet Take 1 packet by mouth daily.     No current facility-administered medications for this visit.    ROS:  See HPI  Physical Exam:  Incision: Left AC fossa incision well-healed without signs of infection Extremities: Palpable left radial pulse.  Left brachiocephalic fistula with palpable thrill, difficult to palpate in the mid to upper arm Neuro: Intact motor and sensation of left hand  Studies: LUE Dialysis Duplex (07/27/2024) +--------------------+----------+-----------------+--------+  AVF                PSV (cm/s)Flow Vol (mL/min)Comments   +--------------------+----------+-----------------+--------+  Native artery inflow   243           613                 +--------------------+----------+-----------------+--------+  AVF Anastomosis        1041                              +--------------------+----------+-----------------+--------+     +------------+----------+-------------+----------+-----------------------+  OUTFLOW VEINPSV (cm/s)Diameter (cm)Depth (cm)       Describe          +------------+----------+-------------+----------+-----------------------+  Shoulder      111        0.45        0.85   competing branch .22 cm  +------------+----------+-------------+----------+-----------------------+  Prox UA        168        0.43        0.73   competing branch .24 cm  +------------+----------+-------------+----------+-----------------------+  Mid UA         196        0.52        0.67   competing branch .16 cm  +------------+----------+-------------+----------+-----------------------+  Dist UA        117        0.45        0.63                            +------------+----------+-------------+----------+-----------------------+    Assessment/Plan:  This is a 63 y.o. male who is here for a postop visit  - The patient returns today for follow-up.  He recently underwent creation of a left brachiocephalic AV fistula for permanent dialysis access -His left arm incision is well-healed without signs of infection.  There is some fullness around his incision, likely due to hematoma.  This should resolve on its own without further intervention - He denies any symptoms of steal in the left hand such as weakness, numbness, excessive coldness, or pain.  He has a palpable left radial pulse.  He has intact motor and sensation of the left hand -Duplex demonstrates a patent left brachiocephalic fistula with good flow volumes of 613 mL/min.  The fistula is slow to mature with diameters less than 6 mm.   There does appear to be several competing branches in the arm that could be contributing to slow maturity. - On exam his left brachiocephalic fistula has a good thrill, however it is difficult to palpate in the arm due to depth - Given that  the patient is not on dialysis yet and is only 5 weeks postop, we will continue to follow his fistula maturity.  I have encouraged him to exercise the arm to see if this will increase the size of the fistula.  He is aware that he may ultimately require competing branch ligation and/or superficialization prior to ever using the fistula.  He can return to clinic in 5 weeks with repeat fistula duplex    Ahmed Holster, PA-C [facility] [phone]   Clinic MD:  Gretta     [1]  Allergies Allergen Reactions   Cefepime  Other (See Comments)    Other Reaction(s): Flushing  Myoclonus  Myoclonus    Other Reaction(s): Flushing  Myoclonus   "

## 2024-08-02 ENCOUNTER — Other Ambulatory Visit: Payer: Self-pay | Admitting: *Deleted

## 2024-08-02 DIAGNOSIS — N186 End stage renal disease: Secondary | ICD-10-CM

## 2024-08-13 ENCOUNTER — Other Ambulatory Visit: Payer: Self-pay

## 2024-08-13 DIAGNOSIS — D631 Anemia in chronic kidney disease: Secondary | ICD-10-CM

## 2024-08-13 DIAGNOSIS — D649 Anemia, unspecified: Secondary | ICD-10-CM

## 2024-08-16 ENCOUNTER — Inpatient Hospital Stay: Attending: Physician Assistant

## 2024-08-16 DIAGNOSIS — I129 Hypertensive chronic kidney disease with stage 1 through stage 4 chronic kidney disease, or unspecified chronic kidney disease: Secondary | ICD-10-CM | POA: Insufficient documentation

## 2024-08-16 DIAGNOSIS — D631 Anemia in chronic kidney disease: Secondary | ICD-10-CM | POA: Insufficient documentation

## 2024-08-16 DIAGNOSIS — N184 Chronic kidney disease, stage 4 (severe): Secondary | ICD-10-CM | POA: Diagnosis present

## 2024-08-16 DIAGNOSIS — D649 Anemia, unspecified: Secondary | ICD-10-CM

## 2024-08-16 LAB — CBC WITH DIFFERENTIAL/PLATELET
Abs Immature Granulocytes: 0.01 K/uL (ref 0.00–0.07)
Basophils Absolute: 0 K/uL (ref 0.0–0.1)
Basophils Relative: 1 %
Eosinophils Absolute: 0.2 K/uL (ref 0.0–0.5)
Eosinophils Relative: 3 %
HCT: 35.6 % — ABNORMAL LOW (ref 39.0–52.0)
Hemoglobin: 11.8 g/dL — ABNORMAL LOW (ref 13.0–17.0)
Immature Granulocytes: 0 %
Lymphocytes Relative: 16 %
Lymphs Abs: 1.1 K/uL (ref 0.7–4.0)
MCH: 31.1 pg (ref 26.0–34.0)
MCHC: 33.1 g/dL (ref 30.0–36.0)
MCV: 93.7 fL (ref 80.0–100.0)
Monocytes Absolute: 0.4 K/uL (ref 0.1–1.0)
Monocytes Relative: 6 %
Neutro Abs: 5.3 K/uL (ref 1.7–7.7)
Neutrophils Relative %: 74 %
Platelets: 255 K/uL (ref 150–400)
RBC: 3.8 MIL/uL — ABNORMAL LOW (ref 4.22–5.81)
RDW: 12.3 % (ref 11.5–15.5)
WBC: 7.1 K/uL (ref 4.0–10.5)
nRBC: 0 % (ref 0.0–0.2)

## 2024-08-16 LAB — COMPREHENSIVE METABOLIC PANEL WITH GFR
ALT: 14 U/L (ref 0–44)
AST: 19 U/L (ref 15–41)
Albumin: 4.4 g/dL (ref 3.5–5.0)
Alkaline Phosphatase: 179 U/L — ABNORMAL HIGH (ref 38–126)
Anion gap: 14 (ref 5–15)
BUN: 42 mg/dL — ABNORMAL HIGH (ref 8–23)
CO2: 25 mmol/L (ref 22–32)
Calcium: 9.2 mg/dL (ref 8.9–10.3)
Chloride: 102 mmol/L (ref 98–111)
Creatinine, Ser: 3.01 mg/dL — ABNORMAL HIGH (ref 0.61–1.24)
GFR, Estimated: 23 mL/min — ABNORMAL LOW
Glucose, Bld: 135 mg/dL — ABNORMAL HIGH (ref 70–99)
Potassium: 4.8 mmol/L (ref 3.5–5.1)
Sodium: 141 mmol/L (ref 135–145)
Total Bilirubin: 0.4 mg/dL (ref 0.0–1.2)
Total Protein: 7.1 g/dL (ref 6.5–8.1)

## 2024-08-16 LAB — IRON AND TIBC
Iron: 61 ug/dL (ref 45–182)
Saturation Ratios: 24 % (ref 17.9–39.5)
TIBC: 258 ug/dL (ref 250–450)
UIBC: 197 ug/dL

## 2024-08-16 LAB — FERRITIN: Ferritin: 660 ng/mL — ABNORMAL HIGH (ref 24–336)

## 2024-08-19 NOTE — Progress Notes (Signed)
 "  [facility] [address], [place] [address]   CLINIC:  Medical Oncology/Hematology  PCP:  Wilhelmina Cedar [address] [place] [address] [phone]   REASON FOR VISIT:  Follow-up for normocytic anemia from CKD/functional iron deficiency  PRIOR THERAPY: Retacrit   CURRENT THERAPY: Intermittent IV iron  INTERVAL HISTORY:   Mr. Copenhaver 64 y.o. 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   He was last seen by Johnston Police, PA-C on 04/26/2024.  At today's visit, he reports feeling fairly well.   He denies any recent hospitalizations, surgeries, or changes in baseline health status.  He has 50% energy and 100% appetite.  He endorses that he is maintaining a stable weight. He is fairly active at [employer] and rides a stationary bike at the gym located there. He is walking better as well. He denies any abnormal fatigue.   He denies any hematochezia, melena, or hematuria.  He denies ice pica. Current supplements include daily iron, Vitamin C, and multivitamin.    ASSESSMENT & PLAN:  1.   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 Rachele. - Received 1 unit PRBC in March 2024 - Patient initiated on Retacrit , received 1 dose 3000 units on 12/16/2022.  Has not required any Retacrit  after first dose in May 2024. - Last Feraheme was on 11/11/2022. - Current supplements include daily iron, Vitamin C, and multivitamin. - Colonoscopy (03/11/2011): Nonbleeding internal hemorrhoids.  Tubular adenoma in the cecum and sessile serrated polyp removed. - EGD (03/11/2023): Normal esophagus, erosive gastropathy, nonbleeding gastric ulcer, biopsied was benign. - Labs from 08/25/2023 showed CKD stage IV.  Normal folate.  Iron levels at goal. MMA mildly elevated at 382.  Normal B12 449. SPEP negative for M spike.  Minimally elevated FLC ratio 2.37 (likely due to CKD stage IV)  - Reports baseline/mild fatigue.   No ice  pica. - No BRBPR/melena. - Most recent labs (08/16/2024): Hgb 11.8/MCV 93.7 Creatinine 3.01/GFR 23 Ferritin 660, iron saturation 24% - PLAN:  Will consider restarting ESA protocol if Hgb persistently <10.0 despite adequate iron stores.   - No indication for IV iron at this time.  Recommend STOPPING oral iron due to elevated ferritin.   - Labs and RTC in 6 months  2.  Social/Family History: Lives at [facility] the past 1.5 years because all of his toes on the left extremity and 2.5 toes on the right extremity were amputated. Worked in clinical biochemist. No chemical exposure. No tobacco use. No family history of anemia. Mother had throat cancer.   PLAN SUMMARY:  >> Labs in 6 months = CBC/D, CMP, ferritin, iron/TIBC >> OFFICE visit in 6 months (1 week after labs)     REVIEW OF SYSTEMS:  Review of Systems  Constitutional:  Positive for fatigue (mild). Negative for appetite change, chills, diaphoresis, fever and unexpected weight change.  HENT:   Negative for lump/mass and nosebleeds.   Eyes:  Negative for eye problems.  Respiratory:  Negative for cough, hemoptysis and shortness of breath.   Cardiovascular:  Negative for chest pain (at times, none today), leg swelling and palpitations.  Gastrointestinal:  Positive for diarrhea. Negative for abdominal pain, blood in stool, constipation, nausea and vomiting.  Genitourinary:  Negative for hematuria.   Musculoskeletal:  Negative for arthralgias and myalgias.  Skin: Negative.   Neurological:  Positive for dizziness and headaches. Negative for light-headedness.  Hematological:  Does not  bruise/bleed easily.  Psychiatric/Behavioral:  Positive for sleep disturbance.     PHYSICAL EXAM:  ECOG PERFORMANCE STATUS: 1 - Symptomatic but completely ambulatory  Vitals:   08/23/24 1001  BP: (!) 97/55  Pulse: 68  Resp: 18  Temp: 97.9 F (36.6 C)  SpO2: 100%   Filed Weights   08/23/24 1001  Weight: 255 lb (115.7 kg)   Physical  Exam Constitutional:      Appearance: Normal appearance. He is obese.  Cardiovascular:     Heart sounds: Normal heart sounds.  Pulmonary:     Breath sounds: Normal breath sounds.  Neurological:     General: No focal deficit present.     Mental Status: Mental status is at baseline.  Psychiatric:        Behavior: Behavior normal. Behavior is cooperative.     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  STROKE  JULY  2017   Tinea unguium    Type 2 diabetes mellitus (HCC)    Vitamin D deficiency    Past Surgical History:  Procedure Laterality Date   ABDOMINAL AORTOGRAM W/LOWER EXTREMITY N/A 11/08/2021   Procedure: ABDOMINAL AORTOGRAM W/LOWER EXTREMITY;  Surgeon: Gretta Lonni PARAS, MD;  [facility];  Service: Cardiovascular;  Laterality: N/A;   ABDOMINAL AORTOGRAM W/LOWER EXTREMITY Right 11/07/2022   Procedure: ABDOMINAL AORTOGRAM W/LOWER EXTREMITY;  Surgeon: Gretta Lonni PARAS, MD;  [facility]: [facility];  Service: Cardiovascular;  Laterality: Right;   AMPUTATION Bilateral 2012   Left big toe partial and right big toe complete   AV FISTULA PLACEMENT Left 06/23/2024   Procedure:  BRACHIOCEPHALIC ARTERIOVENOUS (AV) FISTULA CREATION;  Surgeon: Gretta Lonni PARAS, MD;  Location: [facility] OR;  Service: Vascular;  Laterality: Left;   BIOPSY  03/11/2023   Procedure: BIOPSY;  Surgeon: Eartha Flavors, Toribio, MD;  Location: [facility];  Service: Gastroenterology;;  upper and lower bx's   CARDIOVASCULAR STRESS TEST  04-05-2014  dr croitoru   low risk lexiscan  nuclear study with mild diaphragmatic attenuation artifact/  no ischemia/  ef 58%   CATARACT EXTRACTION W/ INTRAOCULAR LENS  IMPLANT, BILATERAL  2013   COLONOSCOPY WITH PROPOFOL  N/A 03/11/2023   Procedure: COLONOSCOPY WITH PROPOFOL ;  Surgeon: Eartha Flavors Toribio, MD;  [facility]: [facility];  Service: Gastroenterology;  Laterality: N/A;  11:45 AM;ASA 3   CYSTO /  LEFT URETERAL STENT PLACEMENT  2012   CYSTOSCOPY W/ URETERAL STENT REMOVAL Left 07/07/2014  Procedure: CYSTO WITH LEFT PORTION STENT REMOVAL;  Surgeon: Norleen JINNY Seltzer, MD;  Location: [facility];  Service: Urology;  Laterality: Left;   CYSTOSCOPY WITH URETEROSCOPY AND STENT PLACEMENT Left 07/07/2014   Procedure: CYSTOLITHALOPAXY URETEROSCOPY WITH STENT;  Surgeon: Norleen JINNY Seltzer, MD;  Location: [facility];  Service: Urology;  Laterality: Left;   ENDARTERECTOMY Right 04/04/2016   Procedure: ENDARTERECTOMY CAROTID ARTERY RIGHT;  Surgeon: Lonni GORMAN Blade, MD;  Location[facility] OR;  Service: Vascular;  Laterality: Right;   ESOPHAGOGASTRODUODENOSCOPY (EGD) WITH PROPOFOL  N/A 03/11/2023   Procedure: ESOPHAGOGASTRODUODENOSCOPY (EGD) WITH PROPOFOL ;  Surgeon: Eartha Angelia Sieving, MD;  [facility];  Service: Gastroenterology;  Laterality: N/A;  11:45 AM;ASA 3   HOLMIUM LASER APPLICATION N/A 07/07/2014   Procedure: HOLMIUM LASER APPLICATION;  Surgeon: Norleen JINNY Seltzer, MD;  Location: [facility];  Service: Urology;  Laterality: N/A;   I & D  INFECTED SPIDER BITE UPPER BACK  06/ 2012   NEPHROLITHOTOMY Left 08/16/2014    Procedure: LEFT NEPHROLITHOTOMY PERCUTANEOUS;  Surgeon: Norleen JINNY Seltzer, MD;  Location: [facility];  Service: Urology;  Laterality: Left;   NEPHROLITHOTOMY Left 08/18/2014   Procedure: LEFT NEPHROLITHOTOMY PERCUTANEOUS SECOND LOOK;  Surgeon: Norleen JINNY Seltzer, MD;  Location: [facility];  Service: Urology;  Laterality: Left;   PATCH ANGIOPLASTY Right 04/04/2016   Procedure: PATCH ANGIOPLASTY RIGHT CAROTID ARTERY;  Surgeon: Lonni GORMAN Blade, MD;  Location[facility] OR;  Service: Vascular;  Laterality: Right;   PERIPHERAL VASCULAR BALLOON ANGIOPLASTY  11/08/2021   Procedure: PERIPHERAL VASCULAR BALLOON ANGIOPLASTY;  Surgeon: Gretta Lonni JINNY, MD;  [facility];  Service: Cardiovascular;;  Left AT   PERIPHERAL VASCULAR BALLOON ANGIOPLASTY Right 11/07/2022   Procedure: PERIPHERAL VASCULAR BALLOON ANGIOPLASTY;  Surgeon: Gretta Lonni JINNY, MD;  [facility];  Service: Cardiovascular;  Laterality: Right;  right TP trunk and PT   POLYPECTOMY  03/11/2023   Procedure: POLYPECTOMY;  Surgeon: Eartha Angelia Sieving, MD;  [facility];  Service: Gastroenterology;;   RETINAL DETACHMENT SURGERY Bilateral 2013   TONSILLECTOMY AND ADENOIDECTOMY  as child    SOCIAL HISTORY:  Social History   Socioeconomic History   Marital status: Single    Spouse name: Not on file   Number of children: 0   Years of education: Not on file   Highest education level: Not on file  Occupational History   Not on file  Tobacco Use   Smoking status: Never   Smokeless tobacco: Never  Vaping Use   Vaping status: Never Used  Substance and Sexual Activity   Alcohol use: Not Currently   Drug use: No   Sexual activity: Not on file  Other Topics Concern   Not on file  Social History Narrative   Lives with wife      Update 11/05/2022   Lives at [facility] rehab currently   Right handed   Caffeine: none    Social Drivers of Health   Tobacco Use: Low Risk (08/13/2024)   Received from [facility]   Patient History    Smoking Tobacco Use: Never    Smokeless Tobacco Use: Never    Passive Exposure: Never  Financial Resource Strain: Low Risk (09/05/2021)   Received from [facility]   Overall Financial Resource Strain (CARDIA)    Difficulty of Paying Living Expenses: Not hard at all  Food Insecurity: Low Risk (10/10/2023)   Received from [facility]  Epic    Within the past 12 months, you worried that your food would run out before you got money to buy more: Never true    Within the past 12 months, the food you bought just didn't last and you didn't have money to get more. : Never true  Transportation Needs: No Transportation Needs (10/10/2023)   Received from [employer]    In the past 12 months, has lack of reliable transportation kept you from medical appointments, meetings, work or from getting things needed for daily living? : No  Physical Activity: Inactive (09/05/2021)   Received from [facility]   Exercise Vital Sign    On average, how many days per week do you engage in moderate to strenuous exercise (like a brisk walk)?: 0 days    On average, how many minutes do you engage in exercise at this level?: 0 min  Stress: No Stress Concern Present (09/05/2021)   Received from [facility] - Occupational Stress Questionnaire    Feeling of Stress : Not at all  Social Connections: Unknown (09/05/2021)   Received from [facility]   Social Connection and Isolation Panel    In a typical week, how many times do you talk on the phone with family, friends, or neighbors?: Once a week    How often do you get together with friends or relatives?: Once a week    How often do you attend church or religious services?: 1 to 4 times per year    Do you belong to any clubs or organizations such as church groups, unions, fraternal or athletic groups, or school groups?: No    How often do you attend meetings of the  clubs or organizations you belong to?: 1 to 4 times per year    Marital Status: Not on file  Intimate Partner Violence: Not At Risk (05/13/2022)   Humiliation, Afraid, Rape, and Kick questionnaire    Fear of Current or Ex-Partner: No    Emotionally Abused: No    Physically Abused: No    Sexually Abused: No  Depression (PHQ2-9): Low Risk (08/23/2024)   Depression (PHQ2-9)    PHQ-2 Score: 0  Alcohol Screen: Not on file  Housing: Low Risk (10/10/2023)   Received from [facility]   Epic    What is your living situation today?: I have a steady place to live    Think about the place you live. Do you have problems with any of the following? Choose all that apply:: None/None on this list  Utilities: Low Risk (10/10/2023)   Received from [facility]   Utilities    In the past 12 months has the electric, gas, oil, or water company threatened to shut off services in your home? : No  Health Literacy: Low Risk (09/05/2021)   Received from [facility] Literacy    How often do you need to have someone help you when you read instructions, pamphlets, or other written material from your doctor or pharmacy?: Never    FAMILY HISTORY:  Family History  Problem Relation Age of Onset   Emphysema Mother    Cancer Mother        Throat   Heart disease Mother    Stroke Neg Hx     CURRENT MEDICATIONS:  Outpatient Encounter Medications as of 08/23/2024  Medication Sig Note   acetaminophen  (TYLENOL ) 650 MG CR tablet Take 650  mg by mouth every 4 (four) hours as needed for pain.    Ascorbic Acid (VITAMIN C PO) Take 500 mg by mouth 2 (two) times daily.    aspirin  EC 81 MG tablet Take 81 mg by mouth daily.    atorvastatin  (LIPITOR) 40 MG tablet Take 40 mg by mouth at bedtime.    Baclofen  5 MG TABS Take 1 tablet (5 mg total) by mouth 3 (three) times daily as needed (muscle spasm).    busPIRone  (BUSPAR ) 5 MG tablet Take 5 mg by mouth 2 (two) times daily. (Patient taking differently: Take 5 mg by  mouth in the morning.)    candesartan (ATACAND) 4 MG tablet Take 4 mg by mouth.    cloNIDine  (CATAPRES  - DOSED IN MG/24 HR) 0.3 mg/24hr patch Place 1 patch (0.3 mg total) onto the skin once a week. 03/11/2023: Left upper arm   clopidogrel  (PLAVIX ) 75 MG tablet Take 75 mg by mouth daily.    ferrous sulfate  325 (65 FE) MG tablet Take 325 mg by mouth daily with breakfast.    furosemide  (LASIX ) 40 MG tablet Take 1 tablet (40 mg total) by mouth daily.    hydrALAZINE  (APRESOLINE ) 50 MG tablet Take 50 mg by mouth. (Patient taking differently: Take 50 mg by mouth. Every 6 hours prn HTN systolic bp greater that 170)    insulin  glargine-yfgn (SEMGLEE ) 100 UNIT/ML Pen at bedtime as needed.    labetalol  (NORMODYNE ) 200 MG tablet Take 200 mg by mouth 3 (three) times daily.    loperamide (IMODIUM A-D) 2 MG tablet Take 2 mg by mouth 4 (four) times daily as needed for diarrhea or loose stools.    magnesium  hydroxide (MILK OF MAGNESIA) 400 MG/5ML suspension Take by mouth daily as needed for mild constipation.    meclizine (ANTIVERT) 25 MG tablet Take 50 mg by mouth every 8 (eight) hours.    mineral oil enema Place 1 enema rectally as needed for severe constipation.    Multiple Vitamin (MULTIVITAMIN) tablet Take 1 tablet by mouth daily.    NIFEdipine (PROCARDIA-XL/NIFEDICAL-XL) 30 MG 24 hr tablet Take 30 mg by mouth daily. 1/2 tablet daily.    ondansetron  (ZOFRAN -ODT) 4 MG disintegrating tablet Take 1 tablet (4 mg total) by mouth every 8 (eight) hours as needed for nausea or vomiting.    pantoprazole  (PROTONIX ) 40 MG tablet Take 1 tablet (40 mg total) by mouth daily.    sertraline  (ZOLOFT ) 25 MG tablet Take 1 tablet (25 mg total) by mouth daily.    sodium bicarbonate 650 MG tablet Take 650 mg by mouth. (Patient taking differently: Take 650 mg by mouth 3 (three) times daily.)    sodium zirconium cyclosilicate  (LOKELMA ) 10 g PACK packet Take 10 g by mouth. One every morning on M,W,F for Hyperkalemia.    traZODone   (DESYREL ) 50 MG tablet Take 50 mg by mouth at bedtime.    VELTASSA 8.4 g packet Take 1 packet by mouth daily.    Wheat Dextrin (BENEFIBER) POWD Take 1 packet by mouth as needed (for GI health dissolve contents in 8 ounces of liquid).    No facility-administered encounter medications on file as of 08/23/2024.    ALLERGIES:  Allergies  Allergen Reactions   Cefepime  Other (See Comments)    Other Reaction(s): Flushing  Myoclonus  Myoclonus    Other Reaction(s): Flushing  Myoclonus    LABORATORY DATA:  I have reviewed the labs as listed.  CBC    Component Value Date/Time   WBC  7.1 08/16/2024 1018   RBC 3.80 (L) 08/16/2024 1018   HGB 11.8 (L) 08/16/2024 1018   HGB 14.1 04/30/2017 1538   HCT 35.6 (L) 08/16/2024 1018   HCT 41.9 04/30/2017 1538   PLT 255 08/16/2024 1018   PLT 249 04/30/2017 1538   MCV 93.7 08/16/2024 1018   MCV 88 04/30/2017 1538   MCH 31.1 08/16/2024 1018   MCHC 33.1 08/16/2024 1018   RDW 12.3 08/16/2024 1018   RDW 13.7 04/30/2017 1538   LYMPHSABS 1.1 08/16/2024 1018   MONOABS 0.4 08/16/2024 1018   EOSABS 0.2 08/16/2024 1018   BASOSABS 0.0 08/16/2024 1018      Latest Ref Rng & Units 08/16/2024   10:18 AM 06/23/2024    9:16 AM 04/26/2024    9:37 AM  CMP  Glucose 70 - 99 mg/dL 864  98  819   BUN 8 - 23 mg/dL 42  46  53   Creatinine 0.61 - 1.24 mg/dL 6.98  6.19  6.69   Sodium 135 - 145 mmol/L 141  138  137   Potassium 3.5 - 5.1 mmol/L 4.8  4.0  4.4   Chloride 98 - 111 mmol/L 102  102  103   CO2 22 - 32 mmol/L 25   20   Calcium  8.9 - 10.3 mg/dL 9.2   8.7   Total Protein 6.5 - 8.1 g/dL 7.1   6.8   Total Bilirubin 0.0 - 1.2 mg/dL 0.4   0.8   Alkaline Phos 38 - 126 U/L 179   142   AST 15 - 41 U/L 19   17   ALT 0 - 44 U/L 14   17     DIAGNOSTIC IMAGING:  I have independently reviewed the relevant imaging and discussed with the patient.   WRAP UP:  All questions were answered. The patient knows to call the clinic with any problems, questions or  concerns.  Medical decision making: Low  Time spent on visit: I spent 15 minutes counseling the patient face to face. The total time spent in the appointment was 22 minutes and more than 50% was on counseling.  Pleasant CHRISTELLA Barefoot, PA-C  08/23/24 10:42 AM  "

## 2024-08-23 ENCOUNTER — Inpatient Hospital Stay: Admitting: Physician Assistant

## 2024-08-23 VITALS — BP 97/55 | HR 68 | Temp 97.9°F | Resp 18 | Ht 70.5 in | Wt 255.0 lb

## 2024-08-23 DIAGNOSIS — N184 Chronic kidney disease, stage 4 (severe): Secondary | ICD-10-CM

## 2024-08-23 DIAGNOSIS — D631 Anemia in chronic kidney disease: Secondary | ICD-10-CM

## 2024-08-23 DIAGNOSIS — I129 Hypertensive chronic kidney disease with stage 1 through stage 4 chronic kidney disease, or unspecified chronic kidney disease: Secondary | ICD-10-CM | POA: Diagnosis not present

## 2024-08-23 DIAGNOSIS — D649 Anemia, unspecified: Secondary | ICD-10-CM

## 2024-08-23 NOTE — Patient Instructions (Signed)
 [facility] at [facility] **VISIT SUMMARY & IMPORTANT INSTRUCTIONS **   You were seen today by Pleasant Barefoot PA-C for your anemia (low red blood count / hemoglobin). Your anemia is related to your chronic kidney disease and iron deficiency. Your blood counts are currently adequate.  (Our goal is for your hemoglobin to be higher than 10). Your iron levels are too high.  STOP taking your iron pill for now.  We will recheck your iron levels at your next visit. We will check labs and see you for office visit again in 6 months. If your hemoglobin is persistently <10.0 despite normal iron levels, we would consider restarting you on Retacrit /Aranesp  injections in the future.   FOLLOW-UP APPOINTMENT: Labs and office visit in 6 months  ** Thank you for trusting me with your healthcare!  I strive to provide all of my patients with quality care at each visit.  If you receive a survey for this visit, I would be so grateful to you for taking the time to provide feedback.  Thank you in advance!  ~ Leilan Bochenek                                        Dr. Mickiel Davonna Pleasant Barefoot, PA-C          Delon Hope, NP   - - - - - - - - - - - - - - - - - -     Thank you for choosing [facility] at [facility] to provide your oncology and hematology care.  To afford each patient quality time with our provider, please arrive at least 15 minutes before your scheduled appointment time.   If you have a lab appointment with the Cancer Center please come in thru the Main Entrance and check in at the main information desk.  You need to re-schedule your appointment should you arrive 10 or more minutes late.  We strive to give you quality time with our providers, and arriving late affects you and other patients whose appointments are after yours.  Also, if you no show three or more times for appointments you may be dismissed from the clinic at the providers  discretion.     Again, thank you for choosing [facility].  Our hope is that these requests will decrease the amount of time that you wait before being seen by our physicians.       _____________________________________________________________  Should you have questions after your visit to [facility], please contact our office at [phone] and follow the prompts.  Our office hours are 8:00 a.m. and 4:30 p.m. Monday - Friday.  Please note that voicemails left after 4:00 p.m. may not be returned until the following business day.  We are closed weekends and major holidays.  You do have access to a nurse 24-7, just call the main number to the clinic [phone] and do not press any options, hold on the line and a nurse will answer the phone.    For prescription refill requests, have your pharmacy contact our office and allow 72 hours.

## 2024-08-30 ENCOUNTER — Ambulatory Visit (INDEPENDENT_AMBULATORY_CARE_PROVIDER_SITE_OTHER): Admitting: Gastroenterology

## 2024-09-09 ENCOUNTER — Ambulatory Visit (INDEPENDENT_AMBULATORY_CARE_PROVIDER_SITE_OTHER): Admitting: Physician Assistant

## 2024-09-09 ENCOUNTER — Ambulatory Visit (HOSPITAL_COMMUNITY): Admission: RE | Admit: 2024-09-09 | Discharge: 2024-09-09 | Attending: Physician Assistant

## 2024-09-09 VITALS — BP 112/65 | HR 64 | Ht 70.0 in | Wt 252.7 lb

## 2024-09-09 DIAGNOSIS — N186 End stage renal disease: Secondary | ICD-10-CM | POA: Diagnosis not present

## 2024-09-09 DIAGNOSIS — E1122 Type 2 diabetes mellitus with diabetic chronic kidney disease: Secondary | ICD-10-CM

## 2024-09-09 DIAGNOSIS — N184 Chronic kidney disease, stage 4 (severe): Secondary | ICD-10-CM

## 2024-09-09 NOTE — Progress Notes (Signed)
" ° ° °  Postoperative Access Visit   History of Present Illness   KEYVIN RISON is a 64 y.o. year old male who presents for postoperative follow-up for: left brachiocephalic arteriovenous fistula (Date: 06/23/24).  The patient's wounds are healed.  The patient denies steal symptoms.  He has developed some swelling in his left arm since surgery however this is not bothersome to him.  He is not yet on hemodialysis.  He is a nursing home resident.   Physical Examination   Vitals:   09/09/24 1241  BP: 112/65  Pulse: 64  Weight: 252 lb 11.2 oz (114.6 kg)  Height: 5' 10 (1.778 m)   Body mass index is 36.26 kg/m.  left arm Incision is healed, palpable radial pulse, hand grip is 5/5, sensation in digits is intact, palpable thrill, bruit can be auscultated     Medical Decision Making   SAHIR TOLSON is a 64 y.o. year old male who presents s/p left brachiocephalic arteriovenous fistula  Patent fistula without signs or symptoms of steal syndrome The patient's access is ready for use should he require initiation of hemodialysis Recommendations included Ace wrap of the left arm from the hand to the [facility] fossa to avoid compression of the fistula.  We also discussed arm elevation as much as possible to help manage left arm edema The patient may follow up on a prn basis   Donnice Sender PA-C [facility] Office: [phone]  Clinic MD: Lan[place]  "

## 2024-10-07 ENCOUNTER — Ambulatory Visit (INDEPENDENT_AMBULATORY_CARE_PROVIDER_SITE_OTHER): Admitting: Gastroenterology

## 2025-02-16 ENCOUNTER — Inpatient Hospital Stay

## 2025-02-23 ENCOUNTER — Inpatient Hospital Stay: Admitting: Physician Assistant
